# Patient Record
Sex: Male | Born: 1935 | Race: White | Hispanic: No | Smoking: Former smoker
Health system: Southern US, Community
[De-identification: ages and names within clinical notes are randomized; demographics above are authoritative.]

## PROBLEM LIST (undated history)

## (undated) DIAGNOSIS — Z951 Presence of aortocoronary bypass graft: Secondary | ICD-10-CM

## (undated) DIAGNOSIS — R0602 Shortness of breath: Principal | ICD-10-CM

## (undated) DIAGNOSIS — R001 Bradycardia, unspecified: Secondary | ICD-10-CM

## (undated) DIAGNOSIS — I2510 Atherosclerotic heart disease of native coronary artery without angina pectoris: Secondary | ICD-10-CM

## (undated) DIAGNOSIS — N281 Cyst of kidney, acquired: Secondary | ICD-10-CM

## (undated) DIAGNOSIS — N183 Chronic kidney disease, stage 3 unspecified: Secondary | ICD-10-CM

## (undated) DIAGNOSIS — I5042 Chronic combined systolic (congestive) and diastolic (congestive) heart failure: Secondary | ICD-10-CM

## (undated) DIAGNOSIS — R9430 Abnormal result of cardiovascular function study, unspecified: Secondary | ICD-10-CM

## (undated) DIAGNOSIS — J189 Pneumonia, unspecified organism: Secondary | ICD-10-CM

## (undated) DIAGNOSIS — G459 Transient cerebral ischemic attack, unspecified: Secondary | ICD-10-CM

## (undated) DIAGNOSIS — I340 Nonrheumatic mitral (valve) insufficiency: Secondary | ICD-10-CM

## (undated) DIAGNOSIS — N200 Calculus of kidney: Secondary | ICD-10-CM

## (undated) DIAGNOSIS — I739 Peripheral vascular disease, unspecified: Secondary | ICD-10-CM

## (undated) DIAGNOSIS — I351 Nonrheumatic aortic (valve) insufficiency: Secondary | ICD-10-CM

## (undated) DIAGNOSIS — E785 Hyperlipidemia, unspecified: Secondary | ICD-10-CM

## (undated) DIAGNOSIS — I77810 Thoracic aortic ectasia: Secondary | ICD-10-CM

## (undated) DIAGNOSIS — K529 Noninfective gastroenteritis and colitis, unspecified: Secondary | ICD-10-CM

## (undated) DIAGNOSIS — R42 Dizziness and giddiness: Secondary | ICD-10-CM

## (undated) DIAGNOSIS — C801 Malignant (primary) neoplasm, unspecified: Secondary | ICD-10-CM

## (undated) DIAGNOSIS — Z77090 Contact with and (suspected) exposure to asbestos: Secondary | ICD-10-CM

## (undated) DIAGNOSIS — I509 Heart failure, unspecified: Secondary | ICD-10-CM

## (undated) DIAGNOSIS — I10 Essential (primary) hypertension: Secondary | ICD-10-CM

## (undated) HISTORY — DX: Presence of aortocoronary bypass graft: Z95.1

## (undated) HISTORY — DX: Shortness of breath: R06.02

## (undated) HISTORY — DX: Atherosclerotic heart disease of native coronary artery without angina pectoris: I25.10

## (undated) HISTORY — PX: OTHER SURGICAL HISTORY: SHX169

## (undated) HISTORY — DX: Nonrheumatic aortic (valve) insufficiency: I35.1

## (undated) HISTORY — DX: Cyst of kidney, acquired: N28.1

## (undated) HISTORY — DX: Abnormal result of cardiovascular function study, unspecified: R94.30

## (undated) HISTORY — PX: CORONARY ARTERY BYPASS GRAFT: SHX141

## (undated) HISTORY — DX: Nonrheumatic mitral (valve) insufficiency: I34.0

## (undated) HISTORY — DX: Essential (primary) hypertension: I10

## (undated) HISTORY — DX: Hyperlipidemia, unspecified: E78.5

## (undated) HISTORY — DX: Pneumonia, unspecified organism: J18.9

## (undated) HISTORY — DX: Bradycardia, unspecified: R00.1

## (undated) HISTORY — DX: Contact with and (suspected) exposure to asbestos: Z77.090

## (undated) HISTORY — DX: Dizziness and giddiness: R42

## (undated) HISTORY — DX: Noninfective gastroenteritis and colitis, unspecified: K52.9

## (undated) HISTORY — DX: Transient cerebral ischemic attack, unspecified: G45.9

## (undated) HISTORY — DX: Calculus of kidney: N20.0

## (undated) HISTORY — DX: Peripheral vascular disease, unspecified: I73.9

---
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
## 1999-04-27 DIAGNOSIS — I779 Disorder of arteries and arterioles, unspecified: Secondary | ICD-10-CM

## 1999-04-27 HISTORY — DX: Disorder of arteries and arterioles, unspecified: I77.9

## 1999-10-30 ENCOUNTER — Ambulatory Visit: Admission: RE | Admit: 1999-10-30 | Discharge: 1999-10-30 | Payer: Self-pay | Admitting: Pulmonary Disease

## 2001-04-26 DIAGNOSIS — I2510 Atherosclerotic heart disease of native coronary artery without angina pectoris: Secondary | ICD-10-CM

## 2001-04-26 HISTORY — DX: Atherosclerotic heart disease of native coronary artery without angina pectoris: I25.10

## 2001-12-19 ENCOUNTER — Ambulatory Visit (HOSPITAL_COMMUNITY): Admission: RE | Admit: 2001-12-19 | Discharge: 2001-12-19 | Payer: Self-pay | Admitting: Cardiology

## 2001-12-19 ENCOUNTER — Encounter: Payer: Self-pay | Admitting: Cardiology

## 2005-04-26 DIAGNOSIS — G459 Transient cerebral ischemic attack, unspecified: Secondary | ICD-10-CM

## 2005-04-26 HISTORY — DX: Transient cerebral ischemic attack, unspecified: G45.9

## 2005-05-03 ENCOUNTER — Encounter: Payer: Self-pay | Admitting: Cardiology

## 2005-06-03 ENCOUNTER — Ambulatory Visit: Payer: Self-pay | Admitting: Cardiology

## 2005-07-09 ENCOUNTER — Encounter: Payer: Self-pay | Admitting: Cardiology

## 2005-11-17 ENCOUNTER — Ambulatory Visit: Payer: Self-pay | Admitting: Cardiology

## 2006-01-03 ENCOUNTER — Ambulatory Visit: Payer: Self-pay | Admitting: Cardiology

## 2006-04-22 ENCOUNTER — Ambulatory Visit: Payer: Self-pay | Admitting: Cardiology

## 2006-05-24 ENCOUNTER — Ambulatory Visit: Payer: Self-pay | Admitting: Cardiology

## 2006-11-21 ENCOUNTER — Ambulatory Visit: Payer: Self-pay | Admitting: Cardiology

## 2007-05-01 ENCOUNTER — Encounter: Payer: Self-pay | Admitting: Cardiology

## 2007-09-11 ENCOUNTER — Ambulatory Visit: Payer: Self-pay | Admitting: Cardiology

## 2007-09-22 ENCOUNTER — Encounter: Payer: Self-pay | Admitting: Cardiology

## 2007-10-06 ENCOUNTER — Ambulatory Visit: Payer: Self-pay | Admitting: Cardiology

## 2008-05-21 ENCOUNTER — Ambulatory Visit: Payer: Self-pay | Admitting: Cardiology

## 2008-07-01 ENCOUNTER — Encounter: Payer: Self-pay | Admitting: Cardiology

## 2008-12-20 DIAGNOSIS — Z87442 Personal history of urinary calculi: Secondary | ICD-10-CM | POA: Insufficient documentation

## 2008-12-20 DIAGNOSIS — Z77090 Contact with and (suspected) exposure to asbestos: Secondary | ICD-10-CM | POA: Insufficient documentation

## 2008-12-20 DIAGNOSIS — Z8719 Personal history of other diseases of the digestive system: Secondary | ICD-10-CM | POA: Insufficient documentation

## 2008-12-20 DIAGNOSIS — E119 Type 2 diabetes mellitus without complications: Secondary | ICD-10-CM | POA: Insufficient documentation

## 2009-08-05 ENCOUNTER — Encounter: Payer: Self-pay | Admitting: Cardiology

## 2009-08-25 ENCOUNTER — Encounter (INDEPENDENT_AMBULATORY_CARE_PROVIDER_SITE_OTHER): Payer: Self-pay | Admitting: *Deleted

## 2009-08-25 ENCOUNTER — Encounter: Payer: Self-pay | Admitting: Cardiology

## 2009-08-26 ENCOUNTER — Ambulatory Visit: Payer: Self-pay | Admitting: Cardiology

## 2010-05-26 NOTE — Miscellaneous (Signed)
  Clinical Lists Changes  Problems: Added new problem of BRADYCARDIA (ICD-427.89) Added new problem of CAROTID ARTERY DISEASE (ICD-433.10) Added new problem of DIZZINESS (ICD-780.4) Observations: Added new observation of PAST MED HX: PNEUMONIA, LEFT LOWER LOBE, HX OF (ICD-481) MYOCARDIAL INFARCTION, HX OF (ICD-412) ASBESTOS EXPOSURE, HX OF (ICD-V15.84) AORTIC SCLEROSIS (ICD-424.1) DYSLIPIDEMIA (ICD-272.4) NEPHROLITHIASIS, HX OF (ICD-V13.01) PERIPHERAL VASCULAR DISEASE (ICD-443.9) DIABETES MELLITUS (ICD-250.00) RENAL CYST, HX OF (ICD-593.2) COLITIS, HX OF (ICD-V12.79) HYPERTENSION (ICD-401.9) CAD   last cath 2003.Marland KitchenMarland Kitchenpotential ischemia in ramus...grafts patent (last nuclear 2002) CABG TIA  ??  2007..medical Rx Dizziness EF  40-45...nuclear 2003  /  50%...echo...12/1998 Bradycardia   muild Carotid artery disease   bilateral 0-39%...2001 SOB   (08/25/2009 13:25)       Past History:  Past Medical History: PNEUMONIA, LEFT LOWER LOBE, HX OF (ICD-481) MYOCARDIAL INFARCTION, HX OF (ICD-412) ASBESTOS EXPOSURE, HX OF (ICD-V15.84) AORTIC SCLEROSIS (ICD-424.1) DYSLIPIDEMIA (ICD-272.4) NEPHROLITHIASIS, HX OF (ICD-V13.01) PERIPHERAL VASCULAR DISEASE (ICD-443.9) DIABETES MELLITUS (ICD-250.00) RENAL CYST, HX OF (ICD-593.2) COLITIS, HX OF (ICD-V12.79) HYPERTENSION (ICD-401.9) CAD   last cath 2003.Marland KitchenMarland Kitchenpotential ischemia in ramus...grafts patent (last nuclear 2002) CABG TIA  ??  2007..medical Rx Dizziness EF  40-45...nuclear 2003  /  50%...echo...12/1998 Bradycardia   muild Carotid artery disease   bilateral 0-39%...2001 SOB

## 2010-05-26 NOTE — Miscellaneous (Signed)
Summary: Refill:Simvastatin  Clinical Lists Changes  Medications: Rx of SIMVASTATIN 40 MG TABS (SIMVASTATIN) Take 1 tablet by mouth at bedtime;  #30 x 1;  Signed;  Entered by: Cyril Loosen, RN, BSN;  Authorized by: Talitha Givens, MD, [facility];  Method used: Electronically to [facility]*, [address] [place], [place], [place]  [address], Ph: [phone], Fax: [phone]    Prescriptions: SIMVASTATIN 40 MG TABS (SIMVASTATIN) Take 1 tablet by mouth at bedtime  #30 x 1   Entered by:   Cyril Loosen, RN, BSN   Authorized by:   Talitha Givens, MD, [facility]   Signed by:   Cyril Loosen, RN, BSN on 08/25/2009   Method used:   Electronically to        [facility]* (retail)       [address]       [place], [place]  [address]       Ph: [phone]       Fax: [phone]   RxID:   [id number]

## 2010-05-26 NOTE — Assessment & Plan Note (Signed)
Summary: LAST SEEN 01/10   Visit Type:  Follow-up Primary Provider:  Beatrix Fetters Shah,MD   History of Present Illness: The patient is seen for cardiology followup.  I followed him for many years when he had significant recurring problems with ischemic heart disease.  This now has been stable for many years.  He has not had chest pain or shortness of breath.  His last catheter was in 2003.  I have not done nuclear testing or echo because he is stable and prefers not to have studies.  He had only minimal carotid disease in the past it is my understanding that he has had Dopplers checked in Dr.Shah's office.  He works very hard to help take care of his wife who is had multiple bony fractures of her legs and has a significant bladder problems.  The patient has been bothered by dizziness.  He is had this problem in the past but he is intermittently worse.  With careful history it sounds more likely that this is vertigo.  Preventive Screening-Counseling & Management  Alcohol-Tobacco     Smoking Status: quit     Year Quit: 2002  Current Medications (verified): 1)  Furosemide 20 Mg Tabs (Furosemide) .... Take 1 Tablet By Mouth Once A Day 2)  Simvastatin 40 Mg Tabs (Simvastatin) .... Take 1 Tablet By Mouth At Bedtime 3)  Glimepiride 4 Mg Tabs (Glimepiride) .... Take 1 Tablet By Mouth Once A Day 4)  Benazepril Hcl 20 Mg Tabs (Benazepril Hcl) .... Take 1 Tablet By Mouth Once A Day 5)  Digoxin 0.25 Mg Tabs (Digoxin) .... Take 1 Tablet By Mouth Once A Day 6)  Meclizine Hcl 25 Mg Tabs (Meclizine Hcl) .... Take 1 Tablet By Mouth Three Times A Day 7)  Aspir-Trin 325 Mg Tbec (Aspirin) .... Take 1 Tablet By Mouth Once A Day 8)  Zocor 40 Mg Tabs (Simvastatin) .... Take 1 Tablet By Mouth Once A Day 9)  Multivitamins  Tabs (Multiple Vitamin) .... Take 1 Tablet By Mouth Once A Day 10)  Fish Oil 1200 Mg Caps (Omega-3 Fatty Acids) .... Take 1 Tablet By Mouth Once A Day 11)  Metformin Hcl 1000 Mg Tabs (Metformin  Hcl) .... Take 1 Tablet By Mouth Two Times A Day 12)  Metoprolol Succinate 50 Mg Xr24h-Tab (Metoprolol Succinate) .... Take 1 Tablet By Mouth Once A Day 13)  Nitrostat 0.4 Mg Subl (Nitroglycerin) .... Use As Directed  Allergies (verified): 1)  ! Tetracycline 2)  ! Sulfa  Comments:  Nurse/Medical Assistant: The patient is currently on medications but does not know the name or dosage at this time. Instructed to contact our office with details. Will update medication list at that time.  Past History:  Past Medical History: Last updated: 08/25/2009 PNEUMONIA, LEFT LOWER LOBE, HX OF (ICD-481) MYOCARDIAL INFARCTION, HX OF (ICD-412) ASBESTOS EXPOSURE, HX OF (ICD-V15.84) AORTIC SCLEROSIS (ICD-424.1) DYSLIPIDEMIA (ICD-272.4) NEPHROLITHIASIS, HX OF (ICD-V13.01) PERIPHERAL VASCULAR DISEASE (ICD-443.9) DIABETES MELLITUS (ICD-250.00) RENAL CYST, HX OF (ICD-593.2) COLITIS, HX OF (ICD-V12.79) HYPERTENSION (ICD-401.9) CAD   last cath 2003.Marland KitchenMarland Kitchenpotential ischemia in ramus...grafts patent (last nuclear 2002) CABG TIA  ??  2007..medical Rx Dizziness EF  40-45...nuclear 2003  /  50%...echo...12/1998 Bradycardia   muild Carotid artery disease   bilateral 0-39%...2001 SOB    Review of Systems       Patient denies fever, chills, headache, sweats, rash, change in vision, change in hearing, chest pain, cough, nausea vomiting, urinary symptoms.  All other systems are reviewed and are negative.  Vital Signs:  Patient profile:   77 year old male Height:      71 inches Weight:      197 pounds BMI:     27.58 Pulse (ortho):   66 / minute BP standing:   162 / 76 Cuff size:   large  Vitals Entered By: Carlye Grippe (Aug 26, 2009 1:13 PM)  Nutrition Counseling: Patient's BMI is greater than 25 and therefore counseled on weight management options.  Serial Vital Signs/Assessments:  Time      Position  BP       Pulse  Resp  Temp     By 1:18 PM   Lying LA  141/82   61                    Lydia  Anderson 1:18 PM   Sitting   155/74   59                    Lydia Anderson 1:18 PM   Standing  162/76   66                    Lydia Anderson 1:22 PM   Standing  149/83   60                    Lydia Anderson 1:24 PM   Standing  170/81   62                    Carlye Grippe    Physical Exam  General:  the patient is stable in general. Head:  head is atraumatic. Eyes:  no xanthelasma. Neck:  no carotid bruits.  No jugular venous distention. Chest Wall:  no chest wall tenderness. Lungs:  lungs are clear.  Breath effort is not labored. Heart:  cardiac exam reveals an S1-S2.  No clicks or significant murmurs. Abdomen:  abdomen is soft. Msk:  no musculoskeletal deformities. Extremities:  no peripheral edema. Skin:  no skin rashes. Psych:  patient is oriented to person time and place.  Affect is normal.   Impression & Recommendations:  Problem # 1:  DIZZINESS (ICD-780.4) To further evaluate the patient's dizziness orthostatic blood pressures were checked today.  There is no significant blood pressure drop and no symptoms with standing.  His heart rate remains in the range of 60-65.  His symptoms are more compatible with vertigo.  He uses meclizine chronically.  I have recommended that he see ENT doctor  Problem # 2:  CAROTID ARTERY DISEASE (ICD-433.10) Patient has had Dopplers through his primary physician over the years.  He has not had severe disease documented.  Problem # 3:  BRADYCARDIA (ICD-427.89)  His updated medication list for this problem includes:    Benazepril Hcl 20 Mg Tabs (Benazepril hcl) .Marland Kitchen... Take 1 tablet by mouth once a day    Aspir-trin 325 Mg Tbec (Aspirin) .Marland Kitchen... Take 1 tablet by mouth once a day    Metoprolol Succinate 50 Mg Xr24h-tab (Metoprolol succinate) .Marland Kitchen... Take 1 tablet by mouth once a day    Nitrostat 0.4 Mg Subl (Nitroglycerin) ..... Use as directed EKG is done today and reviewed by me.  Heart rate is 55.  This is stable bradycardia for him.  We have to  keep his bradycardia in mind but I do not believe it is causing symptoms at this point.  Problem # 4:  CORONARY ARTERY DISEASE (ICD-414.00)  His updated  medication list for this problem includes:    Benazepril Hcl 20 Mg Tabs (Benazepril hcl) .Marland Kitchen... Take 1 tablet by mouth once a day    Aspir-trin 325 Mg Tbec (Aspirin) .Marland Kitchen... Take 1 tablet by mouth once a day    Metoprolol Succinate 50 Mg Xr24h-tab (Metoprolol succinate) .Marland Kitchen... Take 1 tablet by mouth once a day    Nitrostat 0.4 Mg Subl (Nitroglycerin) ..... Use as directed Coronary disease is stable.  EKG reveals no significant change.  Problem # 5:  AORTIC SCLEROSIS (ICD-424.1)  His updated medication list for this problem includes:    Furosemide 20 Mg Tabs (Furosemide) .Marland Kitchen... Take 1 tablet by mouth once a day    Benazepril Hcl 20 Mg Tabs (Benazepril hcl) .Marland Kitchen... Take 1 tablet by mouth once a day    Digoxin 0.25 Mg Tabs (Digoxin) .Marland Kitchen... Take 1 tablet by mouth once a day    Metoprolol Succinate 50 Mg Xr24h-tab (Metoprolol succinate) .Marland Kitchen... Take 1 tablet by mouth once a day    Nitrostat 0.4 Mg Subl (Nitroglycerin) ..... Use as directed Patient's physical exam has not changed significantly.  I do not believe that he developed significant aortic stenosis.  I chosen not to do a follow up echo at this time.  Other Orders: EKG w/ Interpretation (93000)  Patient Instructions: 1)  Your physician wants you to follow-up in: 1 year. You will receive a reminder letter in the mail one-two months in advance. If you don't receive a letter, please call our office to schedule the follow-up appointment. 2)  Your physician recommends that you continue on your current medications as directed. Please refer to the Current Medication list given to you today.

## 2010-08-25 ENCOUNTER — Other Ambulatory Visit: Payer: Self-pay | Admitting: Cardiology

## 2010-09-08 NOTE — Assessment & Plan Note (Signed)
[facility]                          [facility] NOTE   NAME:[name], [name]                     MRN:          [id number]  DATE:05/21/2008                            DOB:          1926/07/16    [name] is here for cardiac followup.  He is here also with his wife  today.  His wife needs a great deal of help and he is providing all of  the support.  They have enormous stress.  Despite this,  his cardiac  status is stable.  He is not having any significant chest pain.  He does  have significant coronary disease.  I have followed him for many years.  His last cath was in 2003.  I have not done any further exercise testing  as he has been stable.  We will consider that at the time of his next  visit.  He has not had any chest pain, shortness of breath, syncope, or  presyncope.   ALLERGIES:  SULFA, TETRACYCLINE.   MEDICATIONS:  See the flow sheet.   REVIEW OF SYSTEMS:  He is not having any GI or GU complaints.  There is  no fevers or chills or headaches.  He has no skin rash.  His review of  systems otherwise is negative.   PHYSICAL EXAMINATION:  Blood pressure is 132/79 with a pulse of 70.  The  patient is oriented to person, time, and place.  Affect is normal.  HEENT reveals no xanthelasma.  He has normal extraocular motion.  There  are no carotid bruits.  There is no jugular venous distention.  Lungs  are clear.  Respiratory effort is not labored.  Cardiac exam reveals an  S1 with an S2.  There are no clicks or significant murmurs.  His abdomen  is soft.  He has no peripheral edema.  No labs were done today.  I have  reviewed his cholesterol and it is time for it to be repeated.   Problems are listed on the note of Sep 11, 2007.  His coronary status is  stable.  He does need a fasting lipid profile.  No other workup at this  time.  The major issue for him is the stress of taking care of his wife  who needs a great deal of help.  No  changes in his meds at this point.     Luis Abed, MD, [facility]  Electronically Signed    JDK/MedQ  DD: 05/21/2008  DT: 05/22/2008  Job #: [id number]   cc:   Kirstie Peri, MD

## 2010-09-08 NOTE — Assessment & Plan Note (Signed)
[facility]                          [facility] NOTE   NAME:[name], [name]                     MRN:          [id number]  DATE:11/21/2006                            DOB:          17-Sep-1926    HISTORY OF PRESENT ILLNESS:  Mr. Creason is doing well.  He is not having  any chest pain.  He has had no further TIA symptoms.  He is going about  good activities including helping to rebuild an old house.  He does have  some dizziness.  He does not do any work off the ground.   He does have documented coronary disease.  He also has peripheral  vascular disease.   PAST MEDICAL HISTORY:   ALLERGIES:  SULFA, TETRACYCLINE.   MEDICATIONS:  1. Furosemide 40.  2. Metoprolol 25.  3. Glimepiride.  4. Benazepril 20,  5. Digoxin 0.25.  6. Meclizine as needed.  7. Aspirin 325.  8. Zocor 40.  9. Multivitamin.  10.Fish oil.  11.Nitroglycerin p.r.n.  12.Metformin 500 t.i.d.   OTHER MEDICAL PROBLEMS:  See the list below.   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  He has some  slight shortness of breath sensation, sometimes at night, but this does  not sound like true PND or orthopnea.  Otherwise, his review of systems  is negative.   PHYSICAL EXAMINATION:  VITAL SIGNS:  Blood pressure 145/79, pulse 55,  weight 201 pounds.  GENERAL:  The patient is oriented to person, place and time.  Affect is  normal.  HEENT:  No xanthelasma.  He has normal extraocular motion.  NECK:  I do not hear any carotid bruits at this time.  There is no  jugular venous distention.  LUNGS:  Clear.  Respiratory effort is not labored.  CARDIAC:  S1, S2.  There are no clicks or significant murmurs.  ABDOMEN:  Soft.  Normal bowel sounds.  No significant peripheral edema.   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   PROBLEMS:  1. Coronary disease.  His last catheterization was in 2003.  He does      have some potential areas for ischemia in the  distribution of his      native ramus.  Cardiolite in 2004 showed this area for some      potential ischemia, but it was not marked.  2. Peripheral vascular disease. He has had Dopplers done through Dr.      Margaretmary Eddy office, and these will need to be followed over time.  3. Question of a TIA in early 2007.  He was on aspirin and Plavix and      then stopped his Plavix.  He continues on aspirin.  He is not on      Aggrenox at this time.  4. History of dizziness, stable.  5. History of mild hypertension.  6. History of colitis.  7. Diabetes.  8. Allergy to sulfa and tetracycline.  9. History of renal cyst and gallstones.  10.Elevated cholesterol.  He has done well with Zocor.  His HDL is low  and he is on fish oil.  I have chosen not to push Niaspan in this      patient at this time.  11.Ejection fraction in the 40-45% range.  He has mild left      ventricular hypertrophy and some inferior hypokinesis.  12.Peripheral vascular disease.  This is followed by Dr.  Sherryll Burger with      Dopplers through his office.  13.Mild relative bradycardia that is stable.   Mr. Mcconaghy is stable.  We will check a digoxin level.  I will see him  for cardiology follow up in six months.     Luis Abed, MD, [facility]  Electronically Signed    JDK/MedQ  DD: 11/21/2006  DT: 11/22/2006  Job #: [id number]   cc:   Kirstie Peri, MD

## 2010-09-08 NOTE — Assessment & Plan Note (Signed)
[facility]                          [facility] NOTE   NAME:HOWELLPradeep, Beaubrun                     MRN:          [id number]  DATE:09/11/2007                            DOB:          1926/10/27    Mr. Dengel is doing well in general.  He still has some dizziness.  In  addition, however, he is now having some shortness of breath when he  lies down at night.  He is not having any significant chest pain.  There  is no exertional shortness of breath.  He is going about reasonable  activities.  Also, he notices that when he coughs he has some pain in  his anterior chest in the lower sternum.  If he supports this area when  he coughs, he is not as bothered.  It sounds musculoskeletal.  The  patient has known coronary disease.  He has been stable in general.   PAST MEDICAL HISTORY:   ALLERGIES:  SULFA, TETRACYCLINE.   MEDICATIONS:  Metoprolol 50, glimepiride, benazepril, digoxin,  meclizine, aspirin, Zocor, multivitamin, fish oil, and metformin.   OTHER MEDICAL PROBLEMS:  See the list below.   REVIEW OF SYSTEMS:  Otherwise, he has no other complaints and his review  of systems otherwise is negative.   PHYSICAL EXAM:  Blood pressure is 125/72 with a pulse of 55.  Weight is  200 pounds.  The patient is oriented to person, time and place.  Affect is normal.  HEENT:  Reveals no xanthelasma.  He has normal extraocular motion.  There is no carotid bruit.  There is no jugular venous distention.  Lungs are clear.  Respiratory effort is not labored.  Cardiac exam reveals S1-S2.  There are no clicks or significant murmurs.  The abdomen is soft.  The patient is trace peripheral edema.   No labs were done today.   PROBLEMS:  1. Coronary disease.  His last cath was in 2003.  He has some      potential ischemia in the distribution of the ramus.  I have not      pushed for an exercise test recently.  I will consider this after      seeing him back.  2. Peripheral vascular disease.  3. Question of a transient ischemic attack in 2007.  He was on aspirin      and Plavix and now he is on aspirin.  At some point it may be      appropriate to switch him to Aggrenox.  4. History of dizziness.  This is a chronic problem.  5. History of mild hypertension treated.  6. History of colitis.  7. Diabetes.  8. Allergies to SULFA and TETRACYCLINE.  9. History of some renal cysts and gallstones.  10.Elevated cholesterol.  He is tolerating Zocor and will remain on      it.  11.Ejection fraction in the 40-45% range with some inferior      hypokinesis.  When I see him back we will consider a 2-D echo.  12.Peripheral vascular disease.  Dopplers were done in Dr. Margaretmary Eddy  office.  13.Mild relative bradycardia that is stable.  14.Sensation of some shortness of breath when he lays down at night.      I am concerned about the shortness of breath.  It may be a volume      issue although he does not appear to be markedly volume overloaded      at this time.  He has been on Lasix some in the past.  I will put      him on 20 of Lasix and see him for followup.  BMET will be checked      before he comes back in.     Luis Abed, MD, [facility]  Electronically Signed    JDK/MedQ  DD: 09/11/2007  DT: 09/11/2007  Job #: [id number]   cc:   Kirstie Peri, MD

## 2010-09-08 NOTE — Assessment & Plan Note (Signed)
[facility]                          [facility] NOTE   NAME:[name], [name]                     MRN:          [id number]  DATE:10/06/2007                            DOB:          1926-09-27    [name] returns for followup.  I saw him on Sep 11, 2007.  At that  time, I started him on a low dose of Lasix.  He says he thinks he feels  about the same.  However, his weight is down a few pounds and his edema  is gone, and I believe that it has probably helped him.   He is not having any chest pain.  He has had no syncope or presyncope.  He mentions that he has a sensation of fatigue but then when he becomes  active, he is able to go about his full activities.   PAST MEDICAL HISTORY:   ALLERGIES:  SULFA and TETRACYCLINE.   MEDICATIONS:  1. Glimepiride.  2. Benazepril.  3. Digoxin.  4. Meclizine.  5. Aspirin.  6. Zocor.  7. Multivitamin.  8. Fish oil.  9. Metformin.  10.Metoprolol.  11.Lasix 20 mg.   OTHER MEDICAL PROBLEMS:  See the complete list on my note of Sep 11, 2007.   REVIEW OF SYSTEMS:  Other than the HPI, his review of systems is  negative.   PHYSICAL EXAMINATION:  VITAL SIGNS:  Blood pressure is 140/84 with pulse  of 67.  The patient is oriented to person, time, and place.  Affect is normal.  HEENT:  No xanthelasma.  There is normal extraocular motion.  There are  no carotid bruits.  There is no jugular venous distention.  LUNGS:  Clear.  Respiratory effort is not labored.  CARDIAC:  S1 and S2.  There are no clicks or significant murmurs.  ABDOMEN:  Soft.  There is no peripheral edema today.   LABORATORY:  His EKG reveals no significant change.   Problems are listed on the note of Sep 11, 2007.  I believe that slight  volume overload has been a problem.  We did do a followup BMET on Sep 22, 2007,  and his creatinine was 0.9.  There  was some slight glucose elevation, and he needs followup with his  primary  physician for this.  He is stable.  I will see him back in 3  months.     Luis Abed, MD, [facility]  Electronically Signed    JDK/MedQ  DD: 10/06/2007  DT: 10/07/2007  Job #: [id number]

## 2010-09-09 ENCOUNTER — Encounter: Payer: Self-pay | Admitting: Cardiology

## 2010-09-10 ENCOUNTER — Encounter: Payer: Self-pay | Admitting: Cardiology

## 2010-09-11 ENCOUNTER — Encounter: Payer: Self-pay | Admitting: Cardiology

## 2010-09-11 DIAGNOSIS — I25810 Atherosclerosis of coronary artery bypass graft(s) without angina pectoris: Secondary | ICD-10-CM | POA: Insufficient documentation

## 2010-09-11 DIAGNOSIS — I10 Essential (primary) hypertension: Secondary | ICD-10-CM | POA: Insufficient documentation

## 2010-09-11 DIAGNOSIS — E785 Hyperlipidemia, unspecified: Secondary | ICD-10-CM | POA: Insufficient documentation

## 2010-09-11 DIAGNOSIS — Z951 Presence of aortocoronary bypass graft: Secondary | ICD-10-CM | POA: Insufficient documentation

## 2010-09-11 DIAGNOSIS — G459 Transient cerebral ischemic attack, unspecified: Secondary | ICD-10-CM | POA: Insufficient documentation

## 2010-09-11 DIAGNOSIS — R001 Bradycardia, unspecified: Secondary | ICD-10-CM | POA: Insufficient documentation

## 2010-09-11 NOTE — Assessment & Plan Note (Signed)
[facility]                          [facility] NOTE   NAME:[name], [name]                     MRN:          [id number]  DATE:05/24/2006                            DOB:          Oct 06, 1926    [name] is doing well.  I saw him last on November 19, 2005.  There was  question that he had had a TIA.  At one point he was actually placed on  Plavix and I believe that his aspirin may have been held.  Since that  time his Plavix has become too expensive and he decided to put himself  back on full dose aspirin and stop the Plavix.  He appears to be quite  stable with this.   He is not having any chest pain or shortness of breath.  He is going  about reasonable activities.  He has had several skin cancers and is  having these treated successfully.   PAST MEDICAL HISTORY:  ALLERGIES:  1. SULFA.  2. TETRACYCLINE.   Medications:  1. Furosemide 40.  2. Metformin 500 b.i.d.  3. Metoprolol 25.  4. Glimepiride.  5. Benazepril 20.  6. Digoxin.  7. Meclizine.  8. Lipitor 20 (to be changed to Zocor 40, generic simvastatin).  9. Aspirin 325.  10.Question Actos.   Other Medical Problems:  See the list on my note of November 17, 2005.   REVIEW OF SYSTEMS:  He is really feeling well. His major problem is  paying for his medicines.   PHYSICAL EXAM:  Blood pressure today is 150/82 with a pulse of 86.  Weight is 206 pounds.  The patient is oriented to person, time and  place.  Affect is normal.  There is no xanthelasma.  There is normal  extraocular motion.  There are no carotid bruits.  There is no jugular  venous distention.  LUNGS:  Clear.  RESPIRATORY EFFORT:  Not labored.  CARDIAC:  Reveals an S1 with an S2.  There are no clicks or significant  murmurs.  ABDOMEN:  Soft.  There are no masses or bruits.  He has no peripheral  edema.   The patient did have a fasting lipid profile dated April 22, 2006.  His cholesterol was 138 with  triglycerides 160, LDL was 81 and his HDL,  unfortunately, was low at 25.   PROBLEMS:  See the complete list on my note of June 03, 2005.  1. Coronary disease, stable.  2. Elevated cholesterol.   He will be switched to simvastatin 40 and otherwise his cardiac status  is stable.  I will see him back in 6 months.     Luis Abed, MD, [facility]  Electronically Signed    JDK/MedQ  DD: 05/24/2006  DT: 05/24/2006  Job #: [id number]   cc:   Kirstie Peri, MD

## 2010-09-11 NOTE — Cardiovascular Report (Signed)
NAME:  [name], [name] NO.:  [id number]   MEDICAL RECORD NO.:  [id number]                   PATIENT TYPE:  OIB   LOCATION:  2899                                 FACILITY:  [facility]   PHYSICIAN:  Rollene Rotunda, M.D. [facility]            DATE OF BIRTH:  May 12, 1926   DATE OF PROCEDURE:  12/19/2001  DATE OF DISCHARGE:                              CARDIAC CATHETERIZATION   PRIMARY CARE PHYSICIAN:  Kirstie Peri, M.D.   PROCEDURE:  Left heart catheterization/coronary arteriography.   INDICATIONS FOR PROCEDURE:  Evaluate patient with chest discomfort and a  Cardiolite suggesting a previous inferior infarct with small probable  ischemia in this distribution as well as moderate ischemia in the  anterolateral base and mid segments.   PROCEDURE NOTE:  Left heart catheterization was performed via the right  femoral artery.  The artery was cannulated using an anterior wall puncture.  A #6 French arterial sheath was inserted via the modified Seldinger  technique.  Preformed Judkins and a pigtail catheter were utilized.  The  patient tolerated the procedure well and left the lab in stable condition.   RESULTS:  1. Hemodynamics     A. LV:  134/21.     B. AO:  133/63.  2. Coronaries     A. The left main was normal.     B. The LAD had a long proximal 80% stenosis into a first diagonal.  The        first diagonal was moderate-sized with a long proximal 80% stenosis.        A first septal perforator was small with ostial 99% stenosis.     C. The circumflex in the AV groove had long 70% stenosis extending into a        mid obtuse marginal.  The mid obtuse marginal was then occluded.        There was a ramus intermedius which was small.  It had ostial long 90-        95% stenosis.     D. Right coronary artery:  The right coronary artery was a large dominant        vessel.  It had long diffuse proximal and mid 50% stenosis.  The PDA        was occluded at the ostium.  A  moderate-sized posterolateral was        occluded at the ostium.  3. Grafts     A. A LIMA to the diagonal and sequential to the mid LAD was widely        patent.     B. A saphenous vein graft to the mid obtuse marginal was widely patent.     C. A saphenous vein graft sequential to the PDA and posterolateral had        long ostial 40% stenosis.  4. Left ventriculogram:  A left ventriculogram was obtained in the RAO  projection.  The EF is approximately 40% with mild global hypokinesis and     inferior moderate akinesis.   CONCLUSION:  Severe native three-vessel coronary artery disease.  Patent  bypass grafts with nonobstructive disease in the graft to the posterior  descending artery posterolateral.  Moderate left ventricular dysfunction.    PLAN:  The patient's ischemia is most likely related to the native ramus  vessel which is not a large caliber vessel.  This would not be amenable to  percutaneous revascularization; therefore, he should have aggressive  secondary risk factor modification continued.  I will add Imdur 60 mg to his  regimen.  We could also consider switching him to Coreg since he will not  seem to tolerate therapeutic doses of Lopressor.  I will defer this to Dr.  Myrtis Ser.                                               Rollene Rotunda, M.D. [facility]    JH/MEDQ  D:  12/19/2001  T:  12/19/2001  Job:  [id number]   cc:   Celso Sickle, M.D. [facility]

## 2010-09-11 NOTE — Assessment & Plan Note (Signed)
[facility]                            [facility] NOTE   NAME:[name], [name]                     MRN:          [id number]  DATE:11/17/2005                            DOB:          [birth year]    [name] is doing well.  He tells me since I saw him in February of 2007  that he had what sounds like a TIA.  He tells me he was evaluated fully.  His aspirin was changed to another medication and he eventually ended up on  Plavix.  He tells me that he had appropriate scans for this to his head.  I  do not have that information at this time.  The patient has not had any  significant chest pain and no change in some mild shortness of breath.  His  last Cardiolite was in 2003 and he had some potential areas of ischemia.  He  is not having any chest discomfort.  I will continue to follow him  symptomatically.   PAST MEDICAL HISTORY:   ALLERGIES:  SULFA and TETRACYCLINE.   MEDICATIONS:  1.  Furosemide 40 mg.  2.  Metformin 500 mg b.i.d.  3.  Toprol 25 mg.  4.  Glimepiride 4 mg.  5.  Benazepril 20 mg.  6.  Digoxin 0.25 mg.  7.  Meclizine t.i.d.  8.  Avandia 4 mg.  9.  Lipitor 20 mg.  10. Fexofenadine 180 mg.  11. Plavix 75 mg.   OTHER MEDICAL PROBLEMS:  See the list in my note of June 03, 2005.   REVIEW OF SYSTEMS:  The patient has some mild dizziness when he looks  upwards.  He has had this for 5-6 years.  Otherwise his review of systems  was negative.   PHYSICAL EXAMINATION:  VITAL SIGNS:  Blood pressure is 148/70 today.  Pulse  is 78.  Lungs are clear.  Respiratory effort is not labored.  NEUROLOGIC:  The patient is oriented to person, time and place, and his  affect is normal.  HEENT:  No xanthelasma.  He has normal extraocular motion.  NECK:  There are no carotid bruits.  There is no jugular venous distention.  CARDIAC:  An S1 and S2.  There are no clicks or significant murmurs.  ABDOMEN:  Soft.  There are no masses or  bruits.  There is no peripheral  edema.   No labs were done.   Problems are listed on my note of June 03, 2005.  1.  Coronary disease, stable.  2.  Peripheral vascular disease.  Dopplers have been done through [name]      office.  3.  New problem:  Question of a transient ischemic attack described by the      patient recently that was evaluated through the [facility] system.  He is      now on Plavix.  His aspirin was stopped.  I do not know the history of      the choice of medicines in this case.  However, I wonder if Aggrenox      could be considered in  this situation.  Therefore, I will put this in      the note to [name] and I will ask the patient to see if Aggrenox can      be considered here.                                   Jesse Abed, MD, [facility]   JDK/MedQ  DD:  11/17/2005  DT:  11/17/2005  Job #:  [id number]   cc:   Jesse Peri, MD

## 2010-09-14 ENCOUNTER — Ambulatory Visit (INDEPENDENT_AMBULATORY_CARE_PROVIDER_SITE_OTHER): Payer: Medicare Other | Admitting: Cardiology

## 2010-09-14 ENCOUNTER — Encounter: Payer: Self-pay | Admitting: Cardiology

## 2010-09-14 DIAGNOSIS — R0602 Shortness of breath: Secondary | ICD-10-CM

## 2010-09-14 DIAGNOSIS — I700 Atherosclerosis of aorta: Secondary | ICD-10-CM

## 2010-09-14 DIAGNOSIS — R42 Dizziness and giddiness: Secondary | ICD-10-CM | POA: Insufficient documentation

## 2010-09-14 DIAGNOSIS — I779 Disorder of arteries and arterioles, unspecified: Secondary | ICD-10-CM

## 2010-09-14 DIAGNOSIS — IMO0001 Reserved for inherently not codable concepts without codable children: Secondary | ICD-10-CM

## 2010-09-14 DIAGNOSIS — I10 Essential (primary) hypertension: Secondary | ICD-10-CM

## 2010-09-14 DIAGNOSIS — I498 Other specified cardiac arrhythmias: Secondary | ICD-10-CM

## 2010-09-14 DIAGNOSIS — R001 Bradycardia, unspecified: Secondary | ICD-10-CM

## 2010-09-14 NOTE — Assessment & Plan Note (Signed)
He complained of shortness of breath is a new one.  He says that it occurs primarily at night time and he has to sit up.  It certainly sounds like the possibility of orthopnea.  However he has no evidence of significant volume overload.  Two-dimensional echo will be done to reassess his LV function.  I will then see him back to see what medication changes we might consider.

## 2010-09-14 NOTE — Progress Notes (Signed)
HPI Patient is here today for cardiology followup.  He has known coronary disease post CABG in the past.  He is not having any chest pain.  He is having some shortness of breath.  This occurs primarily at night.  It sounds like it may be orthopnea.  However he does not have any volume overload.  He may have shortness of breath also during the day on a random basis.  Is not feeling any palpitations.  She does have resting bradycardia.  He said dizziness for many years.  This has been worked up extensively.  When he saw an ear doctor physical therapy was recommended for one month.  I do not have access to any diagnoses.  Unfortunately he has had falling spells over many years related to his dizziness.  This does not sound cardiac in origin. Allergies  Allergen Reactions  . Sulfonamide Derivatives   . Tetracycline     Current Outpatient Prescriptions  Medication Sig Dispense Refill  . aspirin (ASPIR-TRIN) 325 MG EC tablet Take 325 mg by mouth daily.        . benazepril (LOTENSIN) 20 MG tablet Take 20 mg by mouth daily.        . digoxin (LANOXIN) 0.25 MG tablet Take 250 mcg by mouth daily.        Marland Kitchen glimepiride (AMARYL) 4 MG tablet Take 4 mg by mouth daily before breakfast.        . metFORMIN (GLUCOPHAGE) 500 MG tablet Take 1,000 mg by mouth 2 (two) times daily with a meal.        . metoprolol (TOPROL-XL) 50 MG 24 hr tablet Take 50 mg by mouth daily.        . Multiple Vitamin (MULTIVITAMIN) tablet Take 1 tablet by mouth daily.        . nitroGLYCERIN (NITROSTAT) 0.4 MG SL tablet Place 0.4 mg under the tongue every 5 (five) minutes as needed.        . Omega-3 Fatty Acids (FISH OIL) 1200 MG CAPS Take 1 capsule by mouth daily.        . simvastatin (ZOCOR) 40 MG tablet TAKE ONE TABLET BY MOUTH AT BEDTIME.  30 tablet  6  . DISCONTD: furosemide (LASIX) 20 MG tablet Take 20 mg by mouth daily.        Marland Kitchen DISCONTD: meclizine (ANTIVERT) 25 MG tablet Take 25 mg by mouth 3 (three) times daily as needed.           History   Social History  . Marital Status: Married    Spouse Name: N/A    Number of Children: 4  . Years of Education: N/A   Occupational History  . RETIRED    Social History Main Topics  . Smoking status: Former Smoker -- 2.0 packs/day for 30 years    Types: Cigarettes    Quit date: 04/26/1980  . Smokeless tobacco: Never Used  . Alcohol Use: Not on file  . Drug Use: Not on file  . Sexually Active: Not on file   Other Topics Concern  . Not on file   Social History Narrative  . No narrative on file    Family History  Problem Relation Age of Onset  . Heart attack Mother 54    Past Medical History  Diagnosis Date  . Pneumonia     left lower lobe  . Hx of CABG   . Asbestos exposure   . Aortic sclerosis   . Dyslipidemia   . Nephrolithiasis   .  PVD (peripheral vascular disease)   . Diabetes mellitus   . Renal cyst   . Colitis   . Hypertension   . CAD (coronary artery disease) 2003    last catheterization 2003, potential ischemia in the ramus, grafts patent  ( last nuclear 2002)  . TIA (transient ischemic attack) 2007    ?? medical Rx   . Dizziness     He has had falls related to this  . Bradycardia     MILD  . Carotid artery disease 2001    BILATERAL 0-39%/  Follow with Dopplers by Dr. Sherril Croon  . SOB (shortness of breath)   . Ejection fraction < 50%     EF 40-45%..NUCLEAR 2003/ 50%.Marland KitchenECHO 12/1998  . Shortness of breath     May, 2012    Past Surgical History  Procedure Date  . Coronary artery bypass graft     ROS  Patient denies fever, chills, headache, sweats, rash, change in vision, change in hearing, chest pain, cough, nausea vomiting, urinary symptoms.  All other systems are reviewed and are negative  PHYSICAL EXAM Patient is quite stable.  He is here with his wife.  He is oriented to person time and place.  Affect is normal.  Head is atraumatic.  There is no xanthelasma.  There is a soft carotid bruit.  There is no jugular venous distention.   Lungs are clear.  Respiratory effort is nonlabored.  Cardiac exam reveals S1-S2.  No clicks or significant murmurs.  The abdomen is soft.  There is no peripheral edema.  There are no musculoskeletal deformities.  There are no skin rashes. Filed Vitals:   09/14/10 1107  BP: 154/84  Pulse: 54  Height: 5\' 11"  (1.803 m)  Weight: 192 lb (87.091 kg)    EKG  EKG is done today and reviewed by me.  He has sinus bradycardia and old inferior infarct.  There is no significant change.  ASSESSMENT & PLAN

## 2010-09-14 NOTE — Assessment & Plan Note (Signed)
The patient has known carotid artery disease.  His Dopplers are followed regularly by his primary physician.

## 2010-09-14 NOTE — Assessment & Plan Note (Signed)
The patient has bradycardia.  This is unchanged from the past.  I've chosen not to change his medicines at this time.

## 2010-09-14 NOTE — Patient Instructions (Addendum)
Will request copy of echo results done by Dr. Sherril Croon in January. Follow up in  3-4 weeks - see appointment above.

## 2010-09-14 NOTE — Assessment & Plan Note (Signed)
There is a history of aortic valve sclerosis.  We will gather more data about his aortic valve with his echo.

## 2010-09-14 NOTE — Assessment & Plan Note (Signed)
Systolic blood pressure is mildly elevated today.  I will not change his medicines until I have his echo.

## 2010-09-22 ENCOUNTER — Encounter: Payer: Self-pay | Admitting: Cardiology

## 2010-10-12 ENCOUNTER — Ambulatory Visit (INDEPENDENT_AMBULATORY_CARE_PROVIDER_SITE_OTHER): Payer: Medicare Other | Admitting: Cardiology

## 2010-10-12 ENCOUNTER — Encounter: Payer: Self-pay | Admitting: Cardiology

## 2010-10-12 VITALS — BP 148/77 | HR 74 | Ht 71.0 in | Wt 192.0 lb

## 2010-10-12 DIAGNOSIS — R0602 Shortness of breath: Secondary | ICD-10-CM

## 2010-10-12 MED ORDER — FUROSEMIDE 20 MG PO TABS: 20.0000 mg | ORAL_TABLET | Freq: Every day | ORAL | Status: DC

## 2010-10-12 NOTE — Patient Instructions (Signed)
Follow up as scheduled. Start Lasix (furosemide) 20mg  daily. Your physician recommends that you go to the [facility] for lab work: BMET (do today).

## 2010-10-12 NOTE — Progress Notes (Signed)
HPI Seen for cardiology followup for cardiology followup.  I saw him last May 21 02012.  Complaints of shortness of breath when lying down.  It sounds like orthopnea.  However he has no definite evidence of other fluid overload.  He returns today the same symptom.  Since his last visit we received a copy of the echo done in January, 2012.  His ejection fraction was 50%.  He is very mild aortic insufficiency.  There is no mention of marked diastolic dysfunction.  Patient has had chronic dizziness unrelated to his cardiac issues.  He tells me interestingly that he fell recently when he was not dizzy.  He had a significant bruise to his left shoulder but no fracture.  He did fracture or rib.  Of interest is the fact that he says that he has not had his dizziness since then.  He does note that he has intermittently had periods with no dizziness in the past. Allergies  Allergen Reactions  . Sulfonamide Derivatives   . Tetracycline     Current Outpatient Prescriptions  Medication Sig Dispense Refill  . aspirin (ASPIR-TRIN) 325 MG EC tablet Take 325 mg by mouth daily.        . benazepril (LOTENSIN) 20 MG tablet Take 20 mg by mouth daily.        . digoxin (LANOXIN) 0.25 MG tablet Take 250 mcg by mouth daily.        Marland Kitchen glimepiride (AMARYL) 4 MG tablet Take 4 mg by mouth daily before breakfast.        . metFORMIN (GLUCOPHAGE) 500 MG tablet Take 1,000 mg by mouth 2 (two) times daily with a meal.        . metoprolol (TOPROL-XL) 50 MG 24 hr tablet Take 50 mg by mouth daily.        . Multiple Vitamin (MULTIVITAMIN) tablet Take 1 tablet by mouth daily.        . nitroGLYCERIN (NITROSTAT) 0.4 MG SL tablet Place 0.4 mg under the tongue every 5 (five) minutes as needed.        . Omega-3 Fatty Acids (FISH OIL) 1200 MG CAPS Take 1 capsule by mouth daily.        . simvastatin (ZOCOR) 40 MG tablet TAKE ONE TABLET BY MOUTH AT BEDTIME.  30 tablet  6    History   Social History  . Marital Status: Married    Spouse  Name: N/A    Number of Children: 4  . Years of Education: N/A   Occupational History  . RETIRED    Social History Main Topics  . Smoking status: Former Smoker -- 2.0 packs/day for 30 years    Types: Cigarettes    Quit date: 04/26/1980  . Smokeless tobacco: Never Used  . Alcohol Use: Not on file  . Drug Use: Not on file  . Sexually Active: Not on file   Other Topics Concern  . Not on file   Social History Narrative  . No narrative on file    Family History  Problem Relation Age of Onset  . Heart attack Mother 23    Past Medical History  Diagnosis Date  . Pneumonia     left lower lobe  . Hx of CABG   . Asbestos exposure   . Aortic sclerosis   . Dyslipidemia   . Nephrolithiasis   . PVD (peripheral vascular disease)   . Diabetes mellitus   . Renal cyst   . Colitis   . Hypertension   .  CAD (coronary artery disease) 2003    last catheterization 2003, potential ischemia in the ramus, grafts patent  ( last nuclear 2002)  . TIA (transient ischemic attack) 2007    ?? medical Rx   . Dizziness     He has had falls related to this  . Bradycardia     MILD  . Carotid artery disease 2001    BILATERAL 0-39%/  Follow with Dopplers by Dr. Sherril Croon  . SOB (shortness of breath)   . Ejection fraction < 50%     EF 40-45%..NUCLEAR 2003/ 50%.Marland KitchenECHO 12/1998  . Shortness of breath     May, 2012    Past Surgical History  Procedure Date  . Coronary artery bypass graft     ROS  Patient denies fever, chills, headache, sweats, rash, change in vision, change in hearing, chest pain, cough, nausea vomiting, urinary symptoms all of the systems are reviewed and are negative.  PHYSICAL EXAM Patient's quite stable today.  It is atraumatic.  He still has evidence of some bruise on his left shoulder.  Lungs are clear.  Respiratory effort is unlabored.  Cardiac exam reveals an S1-S2.  No clicks or significant murmurs.  Abdomen is soft.  There is no peripheral edema. Filed Vitals:   10/12/10  1408  BP: 148/77  Pulse: 74  Height: 5\' 11"  (1.803 m)  Weight: 192 lb (87.091 kg)  SpO2: 97%    EKG Is not done today.  ASSESSMENT & PLAN

## 2010-10-12 NOTE — Assessment & Plan Note (Signed)
I am puzzled by his history of shortness of breath.  On a repeated basis however if this occurs when he is lying down.  It may be orthopnea.  He says he feels better when he gets up and walks around.  We will start a very low dose of a diuretic.  I am hesitant about this with his dizziness history.  Also he may have had some mild orthostatic symptoms in the past.  He understands to be very careful.  Will use only 20 mg of Lasix.  If he gets any signs of worsening orthostatic changes he will stop the medicine.  I'll see him for followup.

## 2010-11-02 ENCOUNTER — Encounter: Payer: Self-pay | Admitting: Cardiology

## 2010-11-02 DIAGNOSIS — I739 Peripheral vascular disease, unspecified: Secondary | ICD-10-CM

## 2010-11-02 DIAGNOSIS — I779 Disorder of arteries and arterioles, unspecified: Secondary | ICD-10-CM | POA: Insufficient documentation

## 2010-11-02 DIAGNOSIS — R9430 Abnormal result of cardiovascular function study, unspecified: Secondary | ICD-10-CM | POA: Insufficient documentation

## 2010-11-03 ENCOUNTER — Encounter: Payer: Self-pay | Admitting: Cardiology

## 2010-11-03 ENCOUNTER — Ambulatory Visit (INDEPENDENT_AMBULATORY_CARE_PROVIDER_SITE_OTHER): Payer: Medicare Other | Admitting: Cardiology

## 2010-11-03 DIAGNOSIS — R0602 Shortness of breath: Secondary | ICD-10-CM

## 2010-11-03 DIAGNOSIS — I10 Essential (primary) hypertension: Secondary | ICD-10-CM

## 2010-11-03 MED ORDER — NITROGLYCERIN 0.4 MG SL SUBL: 0.4000 mg | SUBLINGUAL_TABLET | SUBLINGUAL | Status: DC | PRN

## 2010-11-03 NOTE — Patient Instructions (Signed)
Your physician wants you to follow-up in: 6 months. You will receive a reminder letter in the mail one-two months in advance. If you don't receive a letter, please call our office to schedule the follow-up appointment. Your physician recommends that you continue on your current medications as directed. Please refer to the Current Medication list given to you today. 

## 2010-11-03 NOTE — Progress Notes (Signed)
HPI Patient is seen for followup of shortness of breath.  I saw him last May 13, 2010.  He gave a history of shortness of breath that didn't sound like orthopnea.  He had no definite signs of fluid overload on physical exam.  We decided to use a small dose of Lasix, 20 mg daily.  He has lost a few pounds.  He is not having shortness of breath at night.  He feels good in general.  Fortunately this has not caused any worsening in the rare orthostatic symptoms that he has.  It is also of note that he has not had return of dizziness.  He thinks his dizziness improved when he fell and hurt his shoulder. Allergies  Allergen Reactions  . Sulfonamide Derivatives   . Tetracycline     Current Outpatient Prescriptions  Medication Sig Dispense Refill  . aspirin (ASPIR-TRIN) 325 MG EC tablet Take 325 mg by mouth daily.        . benazepril (LOTENSIN) 20 MG tablet Take 20 mg by mouth daily.        . digoxin (LANOXIN) 0.25 MG tablet Take 250 mcg by mouth daily.        . furosemide (LASIX) 20 MG tablet Take 1 tablet (20 mg total) by mouth daily.  30 tablet  6  . glimepiride (AMARYL) 4 MG tablet Take 4 mg by mouth daily before breakfast.        . metFORMIN (GLUCOPHAGE) 500 MG tablet Take 1,000 mg by mouth 2 (two) times daily with a meal.        . metoprolol (TOPROL-XL) 50 MG 24 hr tablet Take 50 mg by mouth daily.        . Multiple Vitamin (MULTIVITAMIN) tablet Take 1 tablet by mouth daily.        . nitroGLYCERIN (NITROSTAT) 0.4 MG SL tablet Place 0.4 mg under the tongue every 5 (five) minutes as needed.        . Omega-3 Fatty Acids (FISH OIL) 1200 MG CAPS Take 1 capsule by mouth daily.        . simvastatin (ZOCOR) 40 MG tablet TAKE ONE TABLET BY MOUTH AT BEDTIME.  30 tablet  6    History   Social History  . Marital Status: Married    Spouse Name: N/A    Number of Children: 4  . Years of Education: N/A   Occupational History  . RETIRED    Social History Main Topics  . Smoking status: Former  Smoker -- 2.0 packs/day for 30 years    Types: Cigarettes    Quit date: 04/26/1980  . Smokeless tobacco: Never Used  . Alcohol Use: Not on file  . Drug Use: Not on file  . Sexually Active: Not on file   Other Topics Concern  . Not on file   Social History Narrative  . No narrative on file    Family History  Problem Relation Age of Onset  . Heart attack Mother 3    Past Medical History  Diagnosis Date  . Pneumonia     left lower lobe  . Hx of CABG   . Asbestos exposure   . Dyslipidemia   . Nephrolithiasis   . PVD (peripheral vascular disease)   . Diabetes mellitus   . Renal cyst   . Colitis   . Hypertension   . CAD (coronary artery disease) 2003    last catheterization 2003, potential ischemia in the ramus, grafts patent  ( last nuclear  2002)  . TIA (transient ischemic attack) 2007    ?? medical Rx   . Dizziness     He has had falls related to this / Chronic dizziness  . Bradycardia     MILD  . Carotid artery disease 2001    BILATERAL 0-39%/  Follow with Dopplers by Dr. Sherril Croon  . SOB (shortness of breath)   . Ejection fraction     50%, echo, January, 2012, no mention of significant diastolic dysfunction  . Shortness of breath     May, 2012  . Aortic insufficiency     Mild, echo, January, 2012    Past Surgical History  Procedure Date  . Coronary artery bypass graft   . Percutaneous transluminal coronary angioplasty     hx    ROS  Patient denies fever, chills, headache, sweats, rash, change in vision, change in hearing, chest pain, cough, nausea vomiting, urinary symptoms.  He still has some discomfort in his left shoulder from his injury.  He can use his arm normally.  All other systems are reviewed and are negative.  PHYSICAL EXAM Patient is quite stable today.  He is oriented to person time and place.  Affect is normal.  Head is atraumatic.  Lungs are clear respiratory effort is nonlabored.  Cardiac exam reveals S1-S2.  No clicks or significant murmurs.   There is no jugular venous distention.  His abdomen is soft.  There is no peripheral edema. Filed Vitals:   11/03/10 1054  BP: 145/85  Pulse: 61  Height: 5\' 11"  (1.803 m)  Weight: 190 lb (86.183 kg)  SpO2: 94%    EKG Is not done today. ASSESSMENT & PLAN

## 2010-11-03 NOTE — Assessment & Plan Note (Signed)
Blood pressure is controlled. No change in therapy. 

## 2010-11-03 NOTE — Assessment & Plan Note (Signed)
It appears that there was a mild volume component to his shortness of breath.  He is not having shortness of breath when he lies down.  He will remain on a small dose of Lasix.  Since we had just started Lasix we will recheck his chemistry and potassium.  We'll see him back in 6 months.

## 2010-12-24 ENCOUNTER — Ambulatory Visit (INDEPENDENT_AMBULATORY_CARE_PROVIDER_SITE_OTHER): Payer: Medicare Other | Admitting: Otolaryngology

## 2010-12-24 DIAGNOSIS — R42 Dizziness and giddiness: Secondary | ICD-10-CM

## 2010-12-24 DIAGNOSIS — H814 Vertigo of central origin: Secondary | ICD-10-CM

## 2011-04-02 ENCOUNTER — Encounter (HOSPITAL_COMMUNITY): Payer: Self-pay | Admitting: *Deleted

## 2011-04-02 ENCOUNTER — Emergency Department (HOSPITAL_COMMUNITY): Payer: Medicare Other

## 2011-04-02 ENCOUNTER — Observation Stay (HOSPITAL_COMMUNITY)
Admission: AD | Admit: 2011-04-02 | Discharge: 2011-04-05 | Disposition: A | Discharge status: Home / Self Care | Payer: Medicare Other | Attending: Internal Medicine | Admitting: Internal Medicine

## 2011-04-02 DIAGNOSIS — E119 Type 2 diabetes mellitus without complications: Secondary | ICD-10-CM

## 2011-04-02 DIAGNOSIS — G459 Transient cerebral ischemic attack, unspecified: Secondary | ICD-10-CM

## 2011-04-02 DIAGNOSIS — I2510 Atherosclerotic heart disease of native coronary artery without angina pectoris: Secondary | ICD-10-CM

## 2011-04-02 DIAGNOSIS — R0602 Shortness of breath: Secondary | ICD-10-CM

## 2011-04-02 DIAGNOSIS — I10 Essential (primary) hypertension: Secondary | ICD-10-CM

## 2011-04-02 DIAGNOSIS — E785 Hyperlipidemia, unspecified: Secondary | ICD-10-CM

## 2011-04-02 DIAGNOSIS — M25569 Pain in unspecified knee: Secondary | ICD-10-CM | POA: Insufficient documentation

## 2011-04-02 DIAGNOSIS — Z951 Presence of aortocoronary bypass graft: Secondary | ICD-10-CM

## 2011-04-02 DIAGNOSIS — Z87442 Personal history of urinary calculi: Secondary | ICD-10-CM

## 2011-04-02 DIAGNOSIS — W208XXA Other cause of strike by thrown, projected or falling object, initial encounter: Secondary | ICD-10-CM | POA: Insufficient documentation

## 2011-04-02 DIAGNOSIS — R9430 Abnormal result of cardiovascular function study, unspecified: Secondary | ICD-10-CM

## 2011-04-02 DIAGNOSIS — R42 Dizziness and giddiness: Secondary | ICD-10-CM

## 2011-04-02 DIAGNOSIS — I779 Disorder of arteries and arterioles, unspecified: Secondary | ICD-10-CM

## 2011-04-02 DIAGNOSIS — R001 Bradycardia, unspecified: Secondary | ICD-10-CM

## 2011-04-02 DIAGNOSIS — S83106A Unspecified dislocation of unspecified knee, initial encounter: Principal | ICD-10-CM | POA: Insufficient documentation

## 2011-04-02 DIAGNOSIS — Z8719 Personal history of other diseases of the digestive system: Secondary | ICD-10-CM

## 2011-04-02 DIAGNOSIS — Y929 Unspecified place or not applicable: Secondary | ICD-10-CM | POA: Insufficient documentation

## 2011-04-02 DIAGNOSIS — Z77090 Contact with and (suspected) exposure to asbestos: Secondary | ICD-10-CM

## 2011-04-02 LAB — DIFFERENTIAL
Eosinophils Absolute: 0.1 10*3/uL (ref 0.0–0.7)
Eosinophils Relative: 1 % (ref 0–5)
Lymphs Abs: 1.4 10*3/uL (ref 0.7–4.0)
Monocytes Relative: 6 % (ref 3–12)

## 2011-04-02 LAB — BASIC METABOLIC PANEL
BUN: 21 mg/dL (ref 6–23)
CO2: 29 mEq/L (ref 19–32)
Calcium: 9.7 mg/dL (ref 8.4–10.5)
GFR calc non Af Amer: 67 mL/min — ABNORMAL LOW (ref >90)
Glucose, Bld: 113 mg/dL — ABNORMAL HIGH (ref 70–99)

## 2011-04-02 LAB — CBC
HCT: 40.7 % (ref 39.0–52.0)
Hemoglobin: 13.4 g/dL (ref 13.0–17.0)
MCH: 30.8 pg (ref 26.0–34.0)
MCV: 93.6 fL (ref 78.0–100.0)
RBC: 4.35 MIL/uL (ref 4.22–5.81)

## 2011-04-02 LAB — APTT: aPTT: 31 seconds (ref 24–37)

## 2011-04-02 MED ORDER — HYDROMORPHONE HCL PF 1 MG/ML IJ SOLN
INTRAMUSCULAR | Status: AC
  Administered 2011-04-02: 0.5 mg via INTRAVENOUS
  Filled 2011-04-02: qty 1

## 2011-04-02 MED ORDER — SODIUM CHLORIDE 0.9 % IV SOLN
Freq: Once | INTRAVENOUS | Status: AC
  Administered 2011-04-02: 20:00:00 via INTRAVENOUS

## 2011-04-02 MED ORDER — HYDROMORPHONE HCL PF 1 MG/ML IJ SOLN: 1.0000 mg | Freq: Once | INTRAMUSCULAR | Status: DC

## 2011-04-02 MED ORDER — MORPHINE SULFATE 2 MG/ML IJ SOLN
INTRAMUSCULAR | Status: AC
  Administered 2011-04-02: 2 mg via INTRAVENOUS
  Filled 2011-04-02: qty 1

## 2011-04-02 MED ORDER — KETOROLAC TROMETHAMINE 30 MG/ML IJ SOLN
15.0000 mg | Freq: Once | INTRAMUSCULAR | Status: AC
  Administered 2011-04-02: 15 mg via INTRAVENOUS
  Filled 2011-04-02: qty 1

## 2011-04-02 NOTE — ED Notes (Signed)
Alert, talking, no distress. 

## 2011-04-02 NOTE — H&P (Signed)
PCP:   Ignatius Specking., MD, MD   Chief Complaint:  Trauma to right lower extremity  HPI: [name] is a pleasant 77 year old male who was read doing his floor today when a bunch of heavy construction material that weighed over 500 pounds fell onto his right leg. Patient came to the ED in extreme pain and had this dislocated his right knee. Dr. Tiburcio Pea in orthopedic surgery has already reduced the right knee and recommended that he be transferred to [facility] for close observation for any further complications that might develop due to the severity of the injury particularly compartment syndrome. He currently is resting comfortably in denies any pain and has been in his normal state of health prior to this traumatic injury.  Review of Systems:  Otherwise negative  Past Medical History: Past Medical History  Diagnosis Date  . Pneumonia     left lower lobe  . Hx of CABG   . Asbestos exposure   . Dyslipidemia   . Nephrolithiasis   . PVD (peripheral vascular disease)   . Diabetes mellitus   . Renal cyst   . Colitis   . Hypertension   . CAD (coronary artery disease) 2003    last catheterization 2003, potential ischemia in the ramus, grafts patent  ( last nuclear 2002)  . TIA (transient ischemic attack) 2007    ?? medical Rx   . Dizziness     He has had falls related to this / Chronic dizziness  . Bradycardia     MILD  . Carotid artery disease 2001    BILATERAL 0-39%/  Follow with Dopplers by Dr. Sherril Croon  . SOB (shortness of breath)   . Ejection fraction     50%, echo, January, 2012, no mention of significant diastolic dysfunction  . Shortness of breath     May, 2012  . Aortic insufficiency     Mild, echo, January, 2012   Past Surgical History  Procedure Date  . Coronary artery bypass graft   . Percutaneous transluminal coronary angioplasty     hx    Medications: Prior to Admission medications   Medication Sig Start Date End Date Taking? Authorizing Provider  aspirin  (ASPIR-TRIN) 325 MG EC tablet Take 325 mg by mouth daily.     Yes Historical Provider, MD  benazepril (LOTENSIN) 20 MG tablet Take 20 mg by mouth daily.     Yes Historical Provider, MD  digoxin (LANOXIN) 0.25 MG tablet Take 250 mcg by mouth daily.     Yes Historical Provider, MD  furosemide (LASIX) 20 MG tablet Take 20 mg by mouth daily as needed. For fluid  10/12/10 10/12/11 Yes Luis Abed, MD  glimepiride (AMARYL) 4 MG tablet Take 4 mg by mouth daily before breakfast.     Yes Historical Provider, MD  metFORMIN (GLUCOPHAGE) 500 MG tablet Take 1,000 mg by mouth 2 (two) times daily with a meal.     Yes Historical Provider, MD  metoprolol tartrate (LOPRESSOR) 25 MG tablet Take 25 mg by mouth 2 (two) times daily.     Yes Historical Provider, MD  Multiple Vitamin (MULTIVITAMIN) tablet Take 1 tablet by mouth daily.     Yes Historical Provider, MD  Omega-3 Fatty Acids (FISH OIL) 1200 MG CAPS Take 1 capsule by mouth daily.     Yes Historical Provider, MD  simvastatin (ZOCOR) 40 MG tablet TAKE ONE TABLET BY MOUTH AT BEDTIME. 08/25/10  Yes Luis Abed, MD  diazepam (VALIUM) 5 MG tablet  Take 5 mg by mouth at bedtime as needed. For nerves     Historical Provider, MD  nitroGLYCERIN (NITROSTAT) 0.4 MG SL tablet Place 1 tablet (0.4 mg total) under the tongue every 5 (five) minutes as needed. 11/03/10   Luis Abed, MD    Allergies:   Allergies  Allergen Reactions  . Sulfonamide Derivatives   . Tetracycline     Social History:  reports that he quit smoking about 30 years ago. His smoking use included Cigarettes. He has a 60 pack-year smoking history. He has never used smokeless tobacco. His alcohol and drug histories not on file.  Family History: Family History  Problem Relation Age of Onset  . Heart attack Mother 39    Physical Exam: Filed Vitals:   04/02/11 1922 04/02/11 1923 04/02/11 1952 04/02/11 2057  BP: 151/60 161/57 148/58 141/49  Pulse: 64 66 74 78  Temp:      TempSrc:      Resp:       Height:      Weight:      SpO2: 98% 100% 98% 96%   General appearance: alert, cooperative and no distress Resp: clear to auscultation bilaterally Cardio: regular rate and rhythm, S1, S2 normal, no murmur, click, rub or gallop GI: soft, non-tender; bowel sounds normal; no masses,  no organomegaly Extremities: extremities normal, atraumatic, no cyanosis or edema  rle in splint Pulses: 2+ and symmetric Skin: Skin color, texture, turgor normal. No rashes or lesions Neurologic: Grossly normal   Labs on Admission:   [facility] 04/02/11 1915  NA 140  K 4.3  CL 99  CO2 29  GLUCOSE 113*  BUN 21  CREATININE 1.01  CALCIUM 9.7  MG --  PHOS --    Basename 04/02/11 1915  WBC 19.8*  NEUTROABS 17.0*  HGB 13.4  HCT 40.7  MCV 93.6  PLT 246     Radiological Exams on Admission: Dg Knee 1-2 Views Right  04/02/2011  *RADIOLOGY REPORT*  Clinical Data: Post reduction right knee dislocation.  PORTABLE RIGHT KNEE - 1-2 VIEW 04/12/2011 1927 hours:  Comparison: Right knee x-rays earlier same date 1834 hours.  Findings: Anatomic alignment of the right knee post reduction. Small bone fragment adjacent to the lateral femoral condyle.  No evidence of fracture elsewhere.  Joint spaces well preserved for age.  IMPRESSION: Anatomic alignment post reduction.  Probable avulsion fracture involving the lateral femoral condyle at or near the insertion of the lateral collateral ligament.  Original Report Authenticated By: Arnell Sieving, M.D.   Dg Knee 1-2 Views Right  04/02/2011  *RADIOLOGY REPORT*  Clinical Data: Injury, pain.  RIGHT KNEE - 1-2 VIEW  Comparison: None.  Findings: Positioning is nonstandard.  There appears to be anterior dislocation of the right knee.  No fracture is identified.  IMPRESSION: Anterior dislocation right knee.  No fracture.  Original Report Authenticated By: Bernadene Bell. Maricela Curet, M.D.   Dg Tibia/fibula Right  04/02/2011  *RADIOLOGY REPORT*  Clinical Data: Fall, pain.   RIGHT TIBIA AND FIBULA - 2 VIEW  Comparison: None.  Findings: No acute bony or joint abnormality is identified. Vascular clips in the medial aspect of the thigh noted.  IMPRESSION: No acute finding.  Original Report Authenticated By: Bernadene Bell. Maricela Curet, M.D.    Assessment/Plan Present on Admission:   77 year old male with traumatic injury to right lower extremity status post reduction of a displaced anterior knee by Dr. Romeo Apple. #1 traumatic injury to right lower extremity with over 500 pounds  of weight patient be transferred to [facility] per request by Dr. Romeo Apple. The EDP here at [facility] has already called orthopedic surgeon on call at [facility] who are aware of the consultation. We'll obtain neurovascular checks and his lower extremities every 4 hours. Monitor very closely for any development of complications. Currently there are none. We'll place him on IV fluids overnight and obtain CPK levels and monitor those every 12 hours. #2 history of coronary artery disease status post CABG this is stable continue his home medications with the exception of holding his aspirin products. #3 hypertension stable #4 diabetes hold his metformin at this time.    [name] A 04/02/2011, 9:06 PM

## 2011-04-02 NOTE — ED Notes (Signed)
Good pedal pulses

## 2011-04-02 NOTE — ED Notes (Signed)
Ice pack to knee

## 2011-04-02 NOTE — ED Provider Notes (Addendum)
Scribed for Jesse Bonier, MD, the patient was seen in room APA04/APA04 . This chart was scribed by Ellie Lunch.    CSN: [id number] Arrival date & time: 04/02/2011  6:22 PM   First MD Initiated Contact with Patient 04/02/11 1823      Chief Complaint  Patient presents with  . Knee Injury    (Consider location/radiation/quality/duration/timing/severity/associated sxs/prior treatment) Patient is a 77 y.o. male presenting with knee pain. The history is provided by the patient. No language interpreter was used.  Knee Pain This is a new problem. The current episode started 3 to 5 hours ago. The problem occurs constantly. The problem has not changed since onset.Exacerbated by: moving and palpation. He has tried nothing for the symptoms.  Pt seen at 6:51 pm [name] is a 77 y.o. male who presents to the Emergency Department complaining of a right  knee pain just PTA. Patient was trying to move boards when ~600 lb of boards fell on his legs. Right leg was splinted by EMS.  Past Medical History  Diagnosis Date  . Pneumonia     left lower lobe  . Hx of CABG   . Asbestos exposure   . Dyslipidemia   . Nephrolithiasis   . PVD (peripheral vascular disease)   . Diabetes mellitus   . Renal cyst   . Colitis   . Hypertension   . CAD (coronary artery disease) 2003    last catheterization 2003, potential ischemia in the ramus, grafts patent  ( last nuclear 2002)  . TIA (transient ischemic attack) 2007    ?? medical Rx   . Dizziness     He has had falls related to this / Chronic dizziness  . Bradycardia     MILD  . Carotid artery disease 2001    BILATERAL 0-39%/  Follow with Dopplers by Dr. Sherril Croon  . SOB (shortness of breath)   . Ejection fraction     50%, echo, January, 2012, no mention of significant diastolic dysfunction  . Shortness of breath     May, 2012  . Aortic insufficiency     Mild, echo, January, 2012    Past Surgical History  Procedure Date  . Coronary artery  bypass graft   . Percutaneous transluminal coronary angioplasty     hx    Family History  Problem Relation Age of Onset  . Heart attack Mother 78    History  Substance Use Topics  . Smoking status: Former Smoker -- 2.0 packs/day for 30 years    Types: Cigarettes    Quit date: 04/26/1980  . Smokeless tobacco: Never Used  . Alcohol Use: Not on file     Review of Systems 10 Systems reviewed and are negative for acute change except as noted in the HPI.  Allergies  Sulfonamide derivatives and Tetracycline  Home Medications   Current Outpatient Rx  Name Route Sig Dispense Refill  . ASPIRIN 325 MG PO TBEC Oral Take 325 mg by mouth daily.      Marland Kitchen BENAZEPRIL HCL 20 MG PO TABS Oral Take 20 mg by mouth daily.      Marland Kitchen DIGOXIN 0.25 MG PO TABS Oral Take 250 mcg by mouth daily.      . FUROSEMIDE 20 MG PO TABS Oral Take 1 tablet (20 mg total) by mouth daily. 30 tablet 6  . GLIMEPIRIDE 4 MG PO TABS Oral Take 4 mg by mouth daily before breakfast.      . METFORMIN HCL 500  MG PO TABS Oral Take 1,000 mg by mouth 2 (two) times daily with a meal.      . METOPROLOL SUCCINATE ER 50 MG PO TB24 Oral Take 50 mg by mouth daily.      Marland Kitchen ONE-DAILY MULTI VITAMINS PO TABS Oral Take 1 tablet by mouth daily.      Marland Kitchen NITROGLYCERIN 0.4 MG SL SUBL Sublingual Place 1 tablet (0.4 mg total) under the tongue every 5 (five) minutes as needed. 25 tablet 3  . FISH OIL 1200 MG PO CAPS Oral Take 1 capsule by mouth daily.      Marland Kitchen SIMVASTATIN 40 MG PO TABS  TAKE ONE TABLET BY MOUTH AT BEDTIME. 30 tablet 6    BP 160/69  Pulse 70  Temp(Src) 97.2 F (36.2 C) (Oral)  Resp 16  Ht 5\' 11"  (1.803 m)  Wt 195 lb (88.451 kg)  BMI 27.20 kg/m2  SpO2 97%  Physical Exam  Nursing note and vitals reviewed. Constitutional: He is oriented to person, place, and time. He appears well-developed and well-nourished.  Cardiovascular: Normal rate, regular rhythm and normal heart sounds.  Exam reveals no gallop and no friction rub.   No  murmur heard.      Clear to auscultation  Pulmonary/Chest: Effort normal.  Abdominal: Soft. Bowel sounds are normal. He exhibits no distension.       Hips are nontender  Musculoskeletal:       Right knee: He exhibits swelling. tenderness found.       Distally in the right foot has an intact dorsalis and pedalis pulse. Distally in the right foot has a intact dorsalis pedis and tibialis pulse. Movement and sensation still present to toes. Obvious abnormality in the knee as expected in dislocation.  Hip stable upon compression.  Neurological: He is alert and oriented to person, place, and time.  Skin: Skin is warm and dry.    ED Course  Procedures (including critical care time) DIAGNOSTIC STUDIES: Oxygen Saturation is 97% on room air, normal by my interpretation.    COORDINATION OF CARE:  Labs Reviewed - No data to display Dg Knee 1-2 Views Right  04/02/2011  *RADIOLOGY REPORT*  Clinical Data: Post reduction right knee dislocation.  PORTABLE RIGHT KNEE - 1-2 VIEW 04/12/2011 1927 hours:  Comparison: Right knee x-rays earlier same date 1834 hours.  Findings: Anatomic alignment of the right knee post reduction. Small bone fragment adjacent to the lateral femoral condyle.  No evidence of fracture elsewhere.  Joint spaces well preserved for age.  IMPRESSION: Anatomic alignment post reduction.  Probable avulsion fracture involving the lateral femoral condyle at or near the insertion of the lateral collateral ligament.  Original Report Authenticated By: Arnell Sieving, M.D.   Dg Knee 1-2 Views Right  04/02/2011  *RADIOLOGY REPORT*  Clinical Data: Injury, pain.  RIGHT KNEE - 1-2 VIEW  Comparison: None.  Findings: Positioning is nonstandard.  There appears to be anterior dislocation of the right knee.  No fracture is identified.  IMPRESSION: Anterior dislocation right knee.  No fracture.  Original Report Authenticated By: Bernadene Bell. Maricela Curet, M.D.   Dg Tibia/fibula Right  04/02/2011  *RADIOLOGY  REPORT*  Clinical Data: Fall, pain.  RIGHT TIBIA AND FIBULA - 2 VIEW  Comparison: None.  Findings: No acute bony or joint abnormality is identified. Vascular clips in the medial aspect of the thigh noted.  IMPRESSION: No acute finding.  Original Report Authenticated By: Bernadene Bell. D'ALESSIO, M.D.   ED MEDICATIONS Medications  HYDROmorphone (DILAUDID)  injection 1 mg (not administered)  ketorolac (TORADOL) 30 MG/ML injection 15 mg (not administered)  HYDROmorphone (DILAUDID) 1 MG/ML injection (not administered)   18:55 DDx includes dislocation of the knee, fracture of the right lower extremity. History and PE findings do not suggest any other injuries.  7:10 PM I discussed the x-ray findings and clinical findings with Dr. Romeo Apple of on-call orthopedics who is coming to attempt to reduce the dislocation of the right knee. He has requested a knee immobilizer to be at bedside to apply after reduction. He also advises that the patient will need transfer and admission to [facility] orthopedics for further management and possibly further studies to include possibly an arteriogram and observation to assure no development of compartment syndrome.  7:47 PM. Pt recheck. EDP discussed with PT that Dr. Romeo Apple was able to reduce knee. Xray shows no broken bones. Discussed plan to admit at [facility] to avoid complications. Pt agrees to plan.   9:15 PM I've spoken with [facility] hospitalist group to help arrange transfer and admission to their service at [facility] and I have called and spoken with Dr. Magnus Ivan of orthopedic surgery who has agreed to be available for consultation to the admitting service at [facility] for orthopedic injury and to assure there is no development of compartment syndrome. Dr. Magnus Ivan requested that I consult vascular surgery to evaluate the patient for vascular injury. There is no sign of vascular injury at this time and no sign of compartment syndrome.  11:23 PM I  spoke by telephone with Dr. Myra Gianotti of vascular surgery who I apprised of the patient's history and physical findings, which at this time only suggest the possibility for development of a vascular injury, however which do not at this time suggest a current vascular injury. Dr. Myra Gianotti stated that he is available for consultation at the request of the admitting physician team. MDM  Right lower extremity fracture, right knee dislocation, at this time there is no apparent arterial insufficiency or compartment syndrome.   I personally performed the services described in this documentation, which was scribed in my presence. The recorded information has been reviewed and considered.     Jesse Bonier, MD 04/02/11 2009  Jesse Bonier, MD 04/02/11 2116  Jesse Bonier, MD 04/02/11 [phone]

## 2011-04-02 NOTE — Procedures (Signed)
Due to the urgency of the situation, knee dislocation, no consent form was obtained, but verbal consent was obtained.  Site was verified by obvious, deformity.  With traction countertraction and flexion the knee was reduced   Repeat exam confirmed DP pulse normal , color and perfusion normal toes moved, ankle DF intact, Great toe extension intact   Sensation improved to normal   Splint applied in 10 degrees flexion  Repeat film confirmed reduction

## 2011-04-02 NOTE — ED Notes (Signed)
Pt was trying to move boards and ~ 600lb of boards fell on legs. Pain and deformity to right knee. Occurred just PTA.  LSB. Right leg splinted by EMS.

## 2011-04-02 NOTE — ED Notes (Signed)
Dr Romeo Apple in ,  Pt placed on monitor, 02, IV infusing well.  MD says this is an emergency and no consent needed . Fear of losing pts leg..  Dislocation resolved, reduced.  Knee immobilizer applied.  Good pedal pulses.  Sinus rhythm

## 2011-04-02 NOTE — ED Notes (Signed)
Sinus rhythm, Good pedal pulses, KI in place.  Alert,talking, good movement of toes.

## 2011-04-02 NOTE — ED Notes (Signed)
Dr Onalee Hua, hospitalist in to see pt

## 2011-04-02 NOTE — Consult Note (Signed)
Reason for Consult:right knee dislocation Referring Physician: Najeh Credit is an 77 y.o. male.  HPI: see er report   Basically heavy floor boards fell on him ~ 4 pm  EMS brought to ER with knee dislocation  Past Medical History  Diagnosis Date  . Pneumonia     left lower lobe  . Hx of CABG   . Asbestos exposure   . Dyslipidemia   . Nephrolithiasis   . PVD (peripheral vascular disease)   . Diabetes mellitus   . Renal cyst   . Colitis   . Hypertension   . CAD (coronary artery disease) 2003    last catheterization 2003, potential ischemia in the ramus, grafts patent  ( last nuclear 2002)  . TIA (transient ischemic attack) 2007    ?? medical Rx   . Dizziness     He has had falls related to this / Chronic dizziness  . Bradycardia     MILD  . Carotid artery disease 2001    BILATERAL 0-39%/  Follow with Dopplers by Dr. Sherril Croon  . SOB (shortness of breath)   . Ejection fraction     50%, echo, January, 2012, no mention of significant diastolic dysfunction  . Shortness of breath     May, 2012  . Aortic insufficiency     Mild, echo, January, 2012    Past Surgical History  Procedure Date  . Coronary artery bypass graft   . Percutaneous transluminal coronary angioplasty     hx    Family History  Problem Relation Age of Onset  . Heart attack Mother 35    Social History:  reports that he quit smoking about 30 years ago. His smoking use included Cigarettes. He has a 60 pack-year smoking history. He has never used smokeless tobacco. His alcohol and drug histories not on file.  Allergies:  Allergies  Allergen Reactions  . Sulfonamide Derivatives   . Tetracycline     Medications: I have reviewed the patient's current medications.  No results found for this or any previous visit (from the past 48 hour(s)).  Dg Knee 1-2 Views Right  04/02/2011  *RADIOLOGY REPORT*  Clinical Data: Injury, pain.  RIGHT KNEE - 1-2 VIEW  Comparison: None.  Findings: Positioning is  nonstandard.  There appears to be anterior dislocation of the right knee.  No fracture is identified.  IMPRESSION: Anterior dislocation right knee.  No fracture.  Original Report Authenticated By: Bernadene Bell. Maricela Curet, M.D.   Dg Tibia/fibula Right  04/02/2011  *RADIOLOGY REPORT*  Clinical Data: Fall, pain.  RIGHT TIBIA AND FIBULA - 2 VIEW  Comparison: None.  Findings: No acute bony or joint abnormality is identified. Vascular clips in the medial aspect of the thigh noted.  IMPRESSION: No acute finding.  Original Report Authenticated By: Bernadene Bell. Maricela Curet, M.D.    Review of Systems  Constitutional: Negative.   HENT: Negative.   Eyes: Negative.   Respiratory: Negative.   Cardiovascular: Negative.   Gastrointestinal: Negative.   Musculoskeletal: Positive for joint pain and falls.  Skin: Negative.   Neurological: Positive for dizziness and tingling.  Endo/Heme/Allergies: Negative.   Psychiatric/Behavioral: Negative.    Blood pressure 151/60, pulse 64, temperature 97.2 F (36.2 C), temperature source Oral, resp. rate 16, height 5\' 11"  (1.803 m), weight 88.451 kg (195 lb), SpO2 98.00%. Physical Exam  Constitutional: He is oriented to person, place, and time. He appears well-developed and well-nourished. He appears distressed.  HENT:  Head: Normocephalic and atraumatic.  Eyes: Pupils are equal, round, and reactive to light.  Neck: Normal range of motion. Neck supple.  Respiratory: Effort normal.  Musculoskeletal:       Right knee: He exhibits decreased range of motion, swelling, ecchymosis, deformity, abnormal alignment and bony tenderness. tenderness found.       Legs: Neurological: He is alert and oriented to person, place, and time.  Skin: Skin is warm and dry. No pallor.  Psychiatric: He has a normal mood and affect.  the DP and PT are intact and color is normal   Assessment/Plan: Knee dislocation anterior lateral (tib with respect to femur)  Rec:  closed reduction  Then  transfer where vascular consult can be done with appropraite monitoring and cardiology support with his history   Fuller Canada 04/02/2011, 7:23 PM

## 2011-04-02 NOTE — ED Notes (Signed)
Med for pain given.

## 2011-04-02 NOTE — ED Notes (Signed)
Deformity to rt knee, with severe pain. Alert, No LOC,  Concrete slabs fell on pt and trapped him for 30 min until got cell phone.  Denies furthur injury.  Skin warm and dry

## 2011-04-02 NOTE — ED Notes (Signed)
Beeped hospitalist to [phone].

## 2011-04-03 DIAGNOSIS — S83106A Unspecified dislocation of unspecified knee, initial encounter: Secondary | ICD-10-CM

## 2011-04-03 LAB — BASIC METABOLIC PANEL
BUN: 22 mg/dL (ref 6–23)
Calcium: 8.9 mg/dL (ref 8.4–10.5)
GFR calc Af Amer: 89 mL/min — ABNORMAL LOW (ref >90)
GFR calc non Af Amer: 77 mL/min — ABNORMAL LOW (ref >90)
Potassium: 4.1 mEq/L (ref 3.5–5.1)

## 2011-04-03 LAB — CBC
Hemoglobin: 12.4 g/dL — ABNORMAL LOW (ref 13.0–17.0)
MCH: 30.2 pg (ref 26.0–34.0)
MCHC: 33.1 g/dL (ref 30.0–36.0)
Platelets: 223 10*3/uL (ref 150–400)
RDW: 13.2 % (ref 11.5–15.5)

## 2011-04-03 LAB — GLUCOSE, CAPILLARY
Glucose-Capillary: 162 mg/dL — ABNORMAL HIGH (ref 70–99)
Glucose-Capillary: 106 mg/dL — ABNORMAL HIGH (ref 70–99)

## 2011-04-03 LAB — CK
Total CK: 1225 U/L — ABNORMAL HIGH (ref 7–232)
Total CK: 1081 U/L — ABNORMAL HIGH (ref 7–232)

## 2011-04-03 MED ORDER — ONDANSETRON HCL 4 MG/2ML IJ SOLN: 4.0000 mg | Freq: Four times a day (QID) | INTRAMUSCULAR | Status: DC | PRN

## 2011-04-03 MED ORDER — FUROSEMIDE 20 MG PO TABS
20.0000 mg | ORAL_TABLET | Freq: Every day | ORAL | Status: DC
  Administered 2011-04-03 – 2011-04-05 (×3): 20 mg via ORAL
  Filled 2011-04-03 (×3): qty 1

## 2011-04-03 MED ORDER — SODIUM CHLORIDE 0.9 % IV SOLN
INTRAVENOUS | Status: AC
  Administered 2011-04-03 (×2): via INTRAVENOUS

## 2011-04-03 MED ORDER — ONDANSETRON HCL 4 MG PO TABS: 4.0000 mg | ORAL_TABLET | Freq: Four times a day (QID) | ORAL | Status: DC | PRN

## 2011-04-03 MED ORDER — GLIMEPIRIDE 4 MG PO TABS
4.0000 mg | ORAL_TABLET | Freq: Every day | ORAL | Status: DC
Start: 2011-04-03 — End: 2011-04-05
  Administered 2011-04-03 – 2011-04-05 (×3): 4 mg via ORAL
  Filled 2011-04-03 (×4): qty 1

## 2011-04-03 MED ORDER — BENAZEPRIL HCL 20 MG PO TABS
20.0000 mg | ORAL_TABLET | Freq: Every day | ORAL | Status: DC
  Administered 2011-04-03 – 2011-04-05 (×3): 20 mg via ORAL
  Filled 2011-04-03 (×3): qty 1

## 2011-04-03 MED ORDER — DIAZEPAM 5 MG PO TABS: 5.0000 mg | ORAL_TABLET | Freq: Every evening | ORAL | Status: DC | PRN

## 2011-04-03 MED ORDER — SIMVASTATIN 40 MG PO TABS
40.0000 mg | ORAL_TABLET | Freq: Every day | ORAL | Status: DC
  Administered 2011-04-03 – 2011-04-04 (×3): 40 mg via ORAL
  Filled 2011-04-03 (×4): qty 1

## 2011-04-03 MED ORDER — HYDROCODONE-ACETAMINOPHEN 5-325 MG PO TABS
1.0000 | ORAL_TABLET | Freq: Four times a day (QID) | ORAL | Status: DC | PRN
  Administered 2011-04-03: 1 via ORAL
  Administered 2011-04-03 – 2011-04-05 (×5): 2 via ORAL
  Filled 2011-04-03 (×7): qty 2

## 2011-04-03 MED ORDER — MORPHINE SULFATE 2 MG/ML IJ SOLN
1.0000 mg | INTRAMUSCULAR | Status: DC | PRN
  Administered 2011-04-03 (×4): 1 mg via INTRAVENOUS
  Filled 2011-04-03 (×5): qty 1

## 2011-04-03 MED ORDER — METOPROLOL TARTRATE 25 MG PO TABS
25.0000 mg | ORAL_TABLET | Freq: Two times a day (BID) | ORAL | Status: DC
Start: 2011-04-03 — End: 2011-04-05
  Administered 2011-04-03 – 2011-04-05 (×6): 25 mg via ORAL
  Filled 2011-04-03 (×7): qty 1

## 2011-04-03 MED ORDER — DIGOXIN 250 MCG PO TABS
250.0000 ug | ORAL_TABLET | Freq: Every day | ORAL | Status: DC
  Administered 2011-04-03 – 2011-04-05 (×3): 250 ug via ORAL
  Filled 2011-04-03 (×3): qty 1

## 2011-04-03 NOTE — Consult Note (Signed)
Reason for Consult: History of right knee dislocation, r/o compartment syndrome Referring Physician: [facility] .  HPI: 77 yo male who had very heavy object fall on his right knee causing an acute knee dislocation.  He was seen at [facility] in [place] aand the knee dislocation was reduced by Dr. Romeo Apple, local orthopedist.  He then recommended the patient be shipped down to [facility] to follow for compartment syndrome and the possibility of a vascular injury.  I was called by the ER physician who relayed the story.  From my standpoint, the dislocation was already reduced and I recommended either the local ortho MD follow for compartment syndrome or vascular surgery could be called to further evaluate for a vascular injury. Non of this occurred and the patient was transferred down here to the hospitalist/medical service.  They have now called me as a consult to r/o compartment syndrome.  It is now greater than 12 hours post his original injury and closed reduction.  He reports right knee pain only and denies numbness in his right foot.   Past Medical History  Diagnosis Date  . Pneumonia     left lower lobe  . Hx of CABG   . Asbestos exposure   . Dyslipidemia   . Nephrolithiasis   . PVD (peripheral vascular disease)   . Diabetes mellitus   . Renal cyst   . Colitis   . Hypertension   . CAD (coronary artery disease) 2003    last catheterization 2003, potential ischemia in the ramus, grafts patent  ( last nuclear 2002)  . TIA (transient ischemic attack) 2007    ?? medical Rx   . Dizziness     He has had falls related to this / Chronic dizziness  . Bradycardia     MILD  . Carotid artery disease 2001    BILATERAL 0-39%/  Follow with Dopplers by Dr. Sherril Croon  . SOB (shortness of breath)   . Ejection fraction     50%, echo, January, 2012, no mention of significant diastolic dysfunction  . Shortness of breath     May, 2012  . Aortic insufficiency     Mild, echo, January,  2012    Past Surgical History  Procedure Date  . Coronary artery bypass graft   . Percutaneous transluminal coronary angioplasty     hx    Family History  Problem Relation Age of Onset  . Heart attack Mother 76    Social History:  reports that he quit smoking about 30 years ago. His smoking use included Cigarettes. He has a 60 pack-year smoking history. He has never used smokeless tobacco. His alcohol and drug histories not on file.  Allergies:  Allergies  Allergen Reactions  . Sulfonamide Derivatives   . Tetracycline     Medications: I have reviewed the patient's current medications.  Results for orders placed during the hospital encounter of 04/02/11 (from the past 48 hour(s))  CBC     Status: Abnormal   Collection Time   04/02/11  7:15 PM      Component Value Range Comment   WBC 19.8 (*) 4.0 - 10.5 (K/uL)    RBC 4.35  4.22 - 5.81 (MIL/uL)    Hemoglobin 13.4  13.0 - 17.0 (g/dL)    HCT 82.9  56.2 - 13.0 (%)    MCV 93.6  78.0 - 100.0 (fL)    MCH 30.8  26.0 - 34.0 (pg)    MCHC 32.9  30.0 - 36.0 (  g/dL)    RDW 16.1  09.6 - 04.5 (%)    Platelets 246  150 - 400 (K/uL)   DIFFERENTIAL     Status: Abnormal   Collection Time   04/02/11  7:15 PM      Component Value Range Comment   Neutrophils Relative 86 (*) 43 - 77 (%)    Neutro Abs 17.0 (*) 1.7 - 7.7 (K/uL)    Lymphocytes Relative 7 (*) 12 - 46 (%)    Lymphs Abs 1.4  0.7 - 4.0 (K/uL)    Monocytes Relative 6  3 - 12 (%)    Monocytes Absolute 1.2 (*) 0.1 - 1.0 (K/uL)    Eosinophils Relative 1  0 - 5 (%)    Eosinophils Absolute 0.1  0.0 - 0.7 (K/uL)    Basophils Relative 0  0 - 1 (%)    Basophils Absolute 0.1  0.0 - 0.1 (K/uL)   BASIC METABOLIC PANEL     Status: Abnormal   Collection Time   04/02/11  7:15 PM      Component Value Range Comment   Sodium 140  135 - 145 (mEq/L)    Potassium 4.3  3.5 - 5.1 (mEq/L)    Chloride 99  96 - 112 (mEq/L)    CO2 29  19 - 32 (mEq/L)    Glucose, Bld 113 (*) 70 - 99 (mg/dL)    BUN 21   6 - 23 (mg/dL)    Creatinine, Ser 4.09  0.50 - 1.35 (mg/dL)    Calcium 9.7  8.4 - 10.5 (mg/dL)    GFR calc non Af Amer 67 (*) >90 (mL/min)    GFR calc Af Amer 78 (*) >90 (mL/min)   PROTIME-INR     Status: Normal   Collection Time   04/02/11  7:15 PM      Component Value Range Comment   Prothrombin Time 14.8  11.6 - 15.2 (seconds)    INR 1.14  0.00 - 1.49    APTT     Status: Normal   Collection Time   04/02/11  7:15 PM      Component Value Range Comment   aPTT 31  24 - 37 (seconds)   BASIC METABOLIC PANEL     Status: Abnormal   Collection Time   04/03/11  1:52 AM      Component Value Range Comment   Sodium 138  135 - 145 (mEq/L)    Potassium 4.1  3.5 - 5.1 (mEq/L)    Chloride 100  96 - 112 (mEq/L)    CO2 27  19 - 32 (mEq/L)    Glucose, Bld 100 (*) 70 - 99 (mg/dL)    BUN 22  6 - 23 (mg/dL)    Creatinine, Ser 8.11  0.50 - 1.35 (mg/dL)    Calcium 8.9  8.4 - 10.5 (mg/dL)    GFR calc non Af Amer 77 (*) >90 (mL/min)    GFR calc Af Amer 89 (*) >90 (mL/min)   CBC     Status: Abnormal   Collection Time   04/03/11  1:52 AM      Component Value Range Comment   WBC 11.9 (*) 4.0 - 10.5 (K/uL)    RBC 4.11 (*) 4.22 - 5.81 (MIL/uL)    Hemoglobin 12.4 (*) 13.0 - 17.0 (g/dL)    HCT 91.4 (*) 78.2 - 52.0 (%)    MCV 91.2  78.0 - 100.0 (fL)    MCH 30.2  26.0 - 34.0 (pg)  MCHC 33.1  30.0 - 36.0 (g/dL)    RDW 16.1  09.6 - 04.5 (%)    Platelets 223  150 - 400 (K/uL)   CK     Status: Abnormal   Collection Time   04/03/11  1:52 AM      Component Value Range Comment   Total CK 1225 (*) 7 - 232 (U/L)   GLUCOSE, CAPILLARY     Status: Abnormal   Collection Time   04/03/11  7:58 AM      Component Value Range Comment   Glucose-Capillary 111 (*) 70 - 99 (mg/dL)    Comment 1 Documented in Chart      Comment 2 Notify RN     GLUCOSE, CAPILLARY     Status: Abnormal   Collection Time   04/03/11 11:47 AM      Component Value Range Comment   Glucose-Capillary 152 (*) 70 - 99 (mg/dL)    Comment 1  Documented in Chart      Comment 2 Notify RN       Dg Knee 1-2 Views Right  04/02/2011  *RADIOLOGY REPORT*  Clinical Data: Post reduction right knee dislocation.  PORTABLE RIGHT KNEE - 1-2 VIEW 04/12/2011 1927 hours:  Comparison: Right knee x-rays earlier same date 1834 hours.  Findings: Anatomic alignment of the right knee post reduction. Small bone fragment adjacent to the lateral femoral condyle.  No evidence of fracture elsewhere.  Joint spaces well preserved for age.  IMPRESSION: Anatomic alignment post reduction.  Probable avulsion fracture involving the lateral femoral condyle at or near the insertion of the lateral collateral ligament.  Original Report Authenticated By: Arnell Sieving, M.D.   Dg Knee 1-2 Views Right  04/02/2011  *RADIOLOGY REPORT*  Clinical Data: Injury, pain.  RIGHT KNEE - 1-2 VIEW  Comparison: None.  Findings: Positioning is nonstandard.  There appears to be anterior dislocation of the right knee.  No fracture is identified.  IMPRESSION: Anterior dislocation right knee.  No fracture.  Original Report Authenticated By: Bernadene Bell. Maricela Curet, M.D.   Dg Tibia/fibula Right  04/02/2011  *RADIOLOGY REPORT*  Clinical Data: Fall, pain.  RIGHT TIBIA AND FIBULA - 2 VIEW  Comparison: None.  Findings: No acute bony or joint abnormality is identified. Vascular clips in the medial aspect of the thigh noted.  IMPRESSION: No acute finding.  Original Report Authenticated By: Bernadene Bell. Maricela Curet, M.D.    Review of Systems  Musculoskeletal: Positive for joint pain.   Blood pressure 113/80, pulse 65, temperature 98.6 F (37 C), temperature source Oral, resp. rate 18, height 5\' 11"  (1.803 m), weight 89.223 kg (196 lb 11.2 oz), SpO2 92.00%. Physical Exam  Musculoskeletal:       Right knee: He exhibits decreased range of motion, swelling, effusion and ecchymosis.  Compartments are all soft in right leg.  He has a normal pulse when comparing the right and left legs/feet.  Clinically in  good alignment.  He moves his ankle and toes easily.  Neg. Homan's  Assessment/Plan: Post closed reduction of a right knee dislocation 1)  His compartments are soft and he has excellent pulses in his foot.  Doing well overall.  He will need a hinged knee brace and PT for mobility with WBAT right leg in brace.  Sidonia Nutter Y 04/03/2011, 1:11 PM

## 2011-04-03 NOTE — ED Notes (Signed)
[facility] here for transport.  

## 2011-04-03 NOTE — Progress Notes (Signed)
Subjective: No complaints other than right knee pain that is fairly well-controlled with pain medication.  Objective: Vital signs in last 24 hours: Temp:  [97.2 F (36.2 C)-99.8 F (37.7 C)] 98.6 F (37 C) (12/08 0545) Pulse Rate:  [64-91] 65  (12/08 0545) Resp:  [16-18] 18  (12/08 0545) BP: (113-171)/(49-82) 113/80 mmHg (12/08 0545) SpO2:  [92 %-100 %] 92 % (12/08 0545) Weight:  [88.451 kg (195 lb)-89.223 kg (196 lb 11.2 oz)] 196 lb 11.2 oz (89.223 kg) (12/08 0130) Weight change:  Last BM Date: 04/01/11  Intake/Output from previous day: 12/07 0701 - 12/08 0700 In: 374.1 [I.V.:374.1] Out: 625 [Urine:625] Total I/O In: 120 [P.O.:120] Out: 400 [Urine:400]   Physical Exam: General: Alert, awake, oriented x3, in no acute distress. HEENT: No bruits, no goiter. Heart: Regular rate and rhythm, without murmurs, rubs, gallops. Lungs: Clear to auscultation bilaterally. Abdomen: Soft, nontender, nondistended, positive bowel sounds. Extremities: No clubbing cyanosis or edema with positive pedal pulses on the left, on the right he has a knee brace that I have not removed however he does not have any edema below the brace and he has good right lower tremor the pulses.     Lab Results: Basic Metabolic Panel:  Basename 04/03/11 0152 04/02/11 1915  NA 138 140  K 4.1 4.3  CL 100 99  CO2 27 29  GLUCOSE 100* 113*  BUN 22 21  CREATININE 0.90 1.01  CALCIUM 8.9 9.7  MG -- --  PHOS -- --   CBC:  Basename 04/03/11 0152 04/02/11 1915  WBC 11.9* 19.8*  NEUTROABS -- 17.0*  HGB 12.4* 13.4  HCT 37.5* 40.7  MCV 91.2 93.6  PLT 223 246   Cardiac Enzymes:  Basename 04/03/11 0152  CKTOTAL 1225*  CKMB --  CKMBINDEX --  TROPONINI --   CBG:  Basename 04/03/11 1147 04/03/11 0758  GLUCAP 152* 111*   Coagulation:  Basename 04/02/11 1915  LABPROT 14.8  INR 1.14    Studies/Results: Dg Knee 1-2 Views Right  04/02/2011  *RADIOLOGY REPORT*  Clinical Data: Post reduction right  knee dislocation.  PORTABLE RIGHT KNEE - 1-2 VIEW 04/12/2011 1927 hours:  Comparison: Right knee x-rays earlier same date 1834 hours.  Findings: Anatomic alignment of the right knee post reduction. Small bone fragment adjacent to the lateral femoral condyle.  No evidence of fracture elsewhere.  Joint spaces well preserved for age.  IMPRESSION: Anatomic alignment post reduction.  Probable avulsion fracture involving the lateral femoral condyle at or near the insertion of the lateral collateral ligament.  Original Report Authenticated By: Arnell Sieving, M.D.   Dg Knee 1-2 Views Right  04/02/2011  *RADIOLOGY REPORT*  Clinical Data: Injury, pain.  RIGHT KNEE - 1-2 VIEW  Comparison: None.  Findings: Positioning is nonstandard.  There appears to be anterior dislocation of the right knee.  No fracture is identified.  IMPRESSION: Anterior dislocation right knee.  No fracture.  Original Report Authenticated By: Bernadene Bell. Maricela Curet, M.D.   Dg Tibia/fibula Right  04/02/2011  *RADIOLOGY REPORT*  Clinical Data: Fall, pain.  RIGHT TIBIA AND FIBULA - 2 VIEW  Comparison: None.  Findings: No acute bony or joint abnormality is identified. Vascular clips in the medial aspect of the thigh noted.  IMPRESSION: No acute finding.  Original Report Authenticated By: Bernadene Bell. Maricela Curet, M.D.    Medications: Scheduled Meds:   . sodium chloride   Intravenous Once  . benazepril  20 mg Oral Daily  . digoxin  250 mcg Oral  Daily  . furosemide  20 mg Oral Daily  . glimepiride  4 mg Oral QAC breakfast  . HYDROmorphone      . ketorolac  15 mg Intravenous Once  . metoprolol tartrate  25 mg Oral BID  . morphine      . simvastatin  40 mg Oral q1800  . DISCONTD:  HYDROmorphone (DILAUDID) injection  1 mg Intravenous Once   Continuous Infusions:   . sodium chloride 75 mL/hr at 04/03/11 0810   PRN Meds:.diazepam, morphine, ondansetron (ZOFRAN) IV, ondansetron  Assessment/Plan:  Principal Problem:  *Knee  dislocation   #1 right knee dislocation: This occurred after a bout at 1200 pound cement board fell on his leg. He initially presented to [facility] emergency department where Dr. Romeo Apple performed a closed reduction of his knee dislocation. For reasons that are unbeknownst to me, patient was somehow transferred to [facility] cone under the medical service for evaluation of potential compartment syndrome by recommendation of Dr. Romeo Apple. Patient is not showing any signs of compartment syndrome, he is not developing any edema or numbness of his right leg and he has equal pulses on the right compared to the left. I have asked Dr. Allie Bossier with orthopedics to assist me. He has already seen the patient and is recommending a hinged knee brace and PT for mobility with weightbearing as tolerated. Will await PT recommendations for potential discharge.    LOS: 1 day   Chaya Jan Pager: [phone] 04/03/2011, 1:29 PM

## 2011-04-03 NOTE — Plan of Care (Signed)
Problem: Consults Goal: Diabetes Guidelines if Diabetic/Glucose > 140 If diabetic or lab glucose is > 140 mg/dl - Initiate Diabetes/Hyperglycemia Guidelines & Document Interventions  Outcome: Progressing CBG ACHS checks ordered-no insulin at this time (Amaryl ordered)     Problem: Phase I Progression Outcomes Goal: OOB as tolerated unless otherwise ordered Outcome: Not Applicable Date Met:  04/03/11 Pt on bedrest

## 2011-04-04 LAB — GLUCOSE, CAPILLARY: Glucose-Capillary: 136 mg/dL — ABNORMAL HIGH (ref 70–99)

## 2011-04-04 LAB — CK: Total CK: 872 U/L — ABNORMAL HIGH (ref 7–232)

## 2011-04-04 MED ORDER — COUMADIN BOOK
Freq: Once | Status: AC
  Administered 2011-04-04: 19:00:00
  Filled 2011-04-04: qty 1

## 2011-04-04 MED ORDER — WARFARIN VIDEO: Freq: Once | Status: DC

## 2011-04-04 MED ORDER — WARFARIN SODIUM 5 MG PO TABS
5.0000 mg | ORAL_TABLET | Freq: Once | ORAL | Status: AC
  Administered 2011-04-04: 5 mg via ORAL
  Filled 2011-04-04: qty 1

## 2011-04-04 NOTE — Progress Notes (Signed)
ANTICOAGULATION CONSULT NOTE - Initial Consult  Pharmacy Consult for warfarin Indication: VTE prophylaxis  Allergies  Allergen Reactions  . Sulfonamide Derivatives   . Tetracycline     Patient Measurements: Height: 5\' 11"  (180.3 cm) Weight: 196 lb 11.2 oz (89.223 kg) IBW/kg (Calculated) : 75.3   Vital Signs: Temp: 98.9 F (37.2 C) (12/09 1333) Temp src: Oral (12/09 1333) BP: 125/40 mmHg (12/09 1333) Pulse Rate: 78  (12/09 1333)  Labs:  Basename 04/04/11 0151 04/03/11 1516 04/03/11 0152 04/02/11 1915  HGB -- -- 12.4* 13.4  HCT -- -- 37.5* 40.7  PLT -- -- 223 246  APTT -- -- -- 31  LABPROT -- -- -- 14.8  INR -- -- -- 1.14  HEPARINUNFRC -- -- -- --  CREATININE -- -- 0.90 1.01  CKTOTAL 872* 1081* 1225* --  CKMB -- -- -- --  TROPONINI -- -- -- --   Estimated Creatinine Clearance: 67.4 ml/min (by C-G formula based on Cr of 0.9).  Medical History: Past Medical History  Diagnosis Date  . Pneumonia     left lower lobe  . Hx of CABG   . Asbestos exposure   . Dyslipidemia   . Nephrolithiasis   . PVD (peripheral vascular disease)   . Diabetes mellitus   . Renal cyst   . Colitis   . Hypertension   . CAD (coronary artery disease) 2003    last catheterization 2003, potential ischemia in the ramus, grafts patent  ( last nuclear 2002)  . TIA (transient ischemic attack) 2007    ?? medical Rx   . Dizziness     He has had falls related to this / Chronic dizziness  . Bradycardia     MILD  . Carotid artery disease 2001    BILATERAL 0-39%/  Follow with Dopplers by Dr. Sherril Croon  . SOB (shortness of breath)   . Ejection fraction     50%, echo, January, 2012, no mention of significant diastolic dysfunction  . Shortness of breath     May, 2012  . Aortic insufficiency     Mild, echo, January, 2012    Medications:  Prescriptions prior to admission  Medication Sig Dispense Refill  . aspirin (ASPIR-TRIN) 325 MG EC tablet Take 325 mg by mouth daily.        . benazepril  (LOTENSIN) 20 MG tablet Take 20 mg by mouth daily.        . digoxin (LANOXIN) 0.25 MG tablet Take 250 mcg by mouth daily.        . furosemide (LASIX) 20 MG tablet Take 20 mg by mouth daily as needed. For fluid       . glimepiride (AMARYL) 4 MG tablet Take 4 mg by mouth daily before breakfast.        . metFORMIN (GLUCOPHAGE) 500 MG tablet Take 1,000 mg by mouth 2 (two) times daily with a meal.        . metoprolol tartrate (LOPRESSOR) 25 MG tablet Take 25 mg by mouth 2 (two) times daily.        . Multiple Vitamin (MULTIVITAMIN) tablet Take 1 tablet by mouth daily.        . Omega-3 Fatty Acids (FISH OIL) 1200 MG CAPS Take 1 capsule by mouth daily.        . simvastatin (ZOCOR) 40 MG tablet TAKE ONE TABLET BY MOUTH AT BEDTIME.  30 tablet  6  . diazepam (VALIUM) 5 MG tablet Take 5 mg by mouth at bedtime as  needed. For nerves       . nitroGLYCERIN (NITROSTAT) 0.4 MG SL tablet Place 1 tablet (0.4 mg total) under the tongue every 5 (five) minutes as needed.  25 tablet  3    Assessment: [name] is an 77 year old gentleman who unfortunately had a board fall on his right leg, dislocating his knee. Per Dr. Magnus Ivan, he is to require 4-6 weeks of anticoagulation for VTE prophylaxis d/t knee immobility. Baseline INR 1.14, CBC 12.4/37.5/223. No contraindications to warfarin at this juncture. Goal of Therapy:  INR 2-3   Plan:  1. Coumadin 5 mg PO x1 tonight 2. Daily PT/INR 3. Coumadin video/book  [name], [name] 04/04/2011,3:09 PM

## 2011-04-04 NOTE — Progress Notes (Signed)
Patient ID: [name], male   DOB: 09/04/1926, 77 y.o.   MRN: [id number] Slow mobility with PT.  Considerable pain with ambulation.  Denies numbness in right foot. AF/VSS Right leg with soft compartments. Foot well perfused with palpable pulse in foot. Nml sensation. Knee brace fitting nicely.  Rec: Will need HHPT likely. Will go with what PT recommends. Need DVT prophylaxis for at least next 4-6 weeks with coumadin.

## 2011-04-04 NOTE — Progress Notes (Signed)
Physical Therapy Evaluation Patient Details Name: [name] MRN: [id number] DOB: February 15, 1927 Today's Date: 04/04/2011  Problem List:  Patient Active Problem List  Diagnoses  . DIABETES MELLITUS  . COLITIS, HX OF  . NEPHROLITHIASIS, HX OF  . ASBESTOS EXPOSURE, HX OF  . Hx of CABG  . Dyslipidemia  . Hypertension  . CAD (coronary artery disease)  . TIA (transient ischemic attack)  . Bradycardia  . Shortness of breath  . Dizziness  . Carotid artery disease  . Ejection fraction  . Knee dislocation    Past Medical History:  Past Medical History  Diagnosis Date  . Pneumonia     left lower lobe  . Hx of CABG   . Asbestos exposure   . Dyslipidemia   . Nephrolithiasis   . PVD (peripheral vascular disease)   . Diabetes mellitus   . Renal cyst   . Colitis   . Hypertension   . CAD (coronary artery disease) 2003    last catheterization 2003, potential ischemia in the ramus, grafts patent  ( last nuclear 2002)  . TIA (transient ischemic attack) 2007    ?? medical Rx   . Dizziness     He has had falls related to this / Chronic dizziness  . Bradycardia     MILD  . Carotid artery disease 2001    BILATERAL 0-39%/  Follow with Dopplers by Dr. Sherril Croon  . SOB (shortness of breath)   . Ejection fraction     50%, echo, January, 2012, no mention of significant diastolic dysfunction  . Shortness of breath     May, 2012  . Aortic insufficiency     Mild, echo, January, 2012   Past Surgical History:  Past Surgical History  Procedure Date  . Coronary artery bypass graft   . Percutaneous transluminal coronary angioplasty     hx    PT Assessment/Plan/Recommendation PT Assessment Clinical Impression Statement: Patient is an 77 yo male admitted with dislocated right knee, s/p closed reduction and Bledsoe brace.  PTA patient was independent and caregiver for spouse.  Patient requiring mod assist for mobility.  Family not able to provide this level of assist 24/7.  Patient needs to  function at modified independent level for family to provide care for him and his wife.  Recommend Inpatient Rehab consult for intense therapy with goal for patient to return home. PT Recommendation/Assessment: Patient will need skilled PT in the acute care venue PT Problem List: Decreased strength;Decreased range of motion;Decreased activity tolerance;Decreased mobility;Decreased balance;Decreased knowledge of use of DME;Pain Barriers to Discharge: Decreased caregiver support (Family can provide supervision level of assist) PT Therapy Diagnosis : Difficulty walking;Acute pain;Generalized weakness PT Plan PT Frequency: Min 6X/week PT Treatment/Interventions: DME instruction;Gait training;Functional mobility training;Balance training;Patient/family education PT Recommendation Recommendations for Other Services: Rehab consult Follow Up Recommendations: Inpatient Rehab Equipment Recommended: Defer to next venue PT Goals  Acute Rehab PT Goals PT Goal Formulation: With patient/family Time For Goal Achievement: 2 weeks Pt will go Supine/Side to Sit: with HOB not 0 degrees (comment degree);Independently PT Goal: Supine/Side to Sit - Progress: Not met Pt will go Sit to Supine/Side: with HOB not 0 degrees (comment degree);Independently PT Goal: Sit to Supine/Side - Progress: Not met Pt will go Sit to Stand: with modified independence PT Goal: Sit to Stand - Progress: Not met Pt will go Stand to Sit: with modified independence PT Goal: Stand to Sit - Progress: Not met Pt will Transfer Bed to Chair/Chair to Bed: with  modified independence PT Transfer Goal: Bed to Chair/Chair to Bed - Progress: Not met Pt will Ambulate: 51 - 150 feet;with modified independence;with rolling walker PT Goal: Ambulate - Progress: Not met  PT Evaluation Precautions/Restrictions  Precautions Precautions: Fall Required Braces or Orthoses: Yes Other Brace/Splint: Bledsoe knee brace right LE at all  times Restrictions Weight Bearing Restrictions: Yes RLE Weight Bearing: Weight bearing as tolerated Other Position/Activity Restrictions: Bledsoe brace right LE at all times Prior Functioning  Home Living Lives With: Spouse (Pt cares for wife) Dolores Lory Help From: Family (All family members work.  Not able to provide 24 hour assist) Type of Home: House Home Layout: Able to live on main level with bedroom/bathroom;Two level Home Access: Ramped entrance Bathroom Shower/Tub: Walk-in shower Bathroom Toilet: Handicapped height Home Adaptive Equipment: Bedside commode/3-in-1;Built-in shower seat;Grab bars around toilet;Grab bars in shower;Hand-held shower hose;Shower chair with back;Walker - rolling;Walker - four wheeled;Wheelchair - manual [employer] raises and lowers) Prior Function Level of Independence: Independent with basic ADLs;Independent with homemaking with ambulation;Independent with gait Driving: Yes Cognition Cognition Arousal/Alertness: Awake/alert Overall Cognitive Status: Appears within functional limits for tasks assessed Sensation/Coordination Sensation Light Touch: Appears Intact Extremity Assessment RUE Assessment RUE Assessment: Within Functional Limits LUE Assessment LUE Assessment: Within Functional Limits RLE Assessment RLE Assessment: Exceptions to [facility] (Bledsoe locked in extension.  Hip strength 3-/5) LLE Assessment LLE Assessment: Within Functional Limits Mobility (including Balance) Bed Mobility Bed Mobility: Yes Supine to Sit: 3: Mod assist;With rails;HOB elevated (Comment degrees) (HOB at 40 degrees) Supine to Sit Details (indicate cue type and reason): Physical assist to move right LE off bed. Cues for technique Sitting - Scoot to Edge of Bed: 3: Mod assist;With rail Sitting - Scoot to [place] of Bed Details (indicate cue type and reason): Cues to try to scoot one hip forward at a time.  Physical assist to support right LE. Transfers Transfers: Yes Sit to  Stand: 3: Mod assist;With upper extremity assist;From bed Sit to Stand Details (indicate cue type and reason): Cues for correct safe hand placement. Stand to Sit: 3: Mod assist;With upper extremity assist;With armrests;To chair/3-in-1 Stand to Sit Details: Cues for placement of RLE and hands to sit.  Assist to slowly lower to chair Ambulation/Gait Ambulation/Gait: Yes Ambulation/Gait Assistance: 4: Min assist Ambulation/Gait Assistance Details (indicate cue type and reason): Cues for safe use of RW and for gait sequence.  Cues to stand upright. Ambulation Distance (Feet): 10 Feet Assistive device: Rolling walker Gait Pattern: Step-to pattern;Decreased step length - left;Decreased stance time - right;Antalgic;Trunk flexed Gait velocity: Slow gait speed Stairs: No    Exercise    End of Session PT - End of Session Equipment Utilized During Treatment: Gait belt;Other (comment) (Bledsoe brace) Activity Tolerance: Patient limited by pain;Patient limited by fatigue Patient left: in chair;with call bell in reach;with family/visitor present Nurse Communication: Mobility status for transfers;Weight bearing status General Behavior During Session: [facility] for tasks performed Cognition: [facility] for tasks performed  Vena Austria [phone] 04/04/2011, 11:18 AM

## 2011-04-05 DIAGNOSIS — S83106A Unspecified dislocation of unspecified knee, initial encounter: Secondary | ICD-10-CM

## 2011-04-05 DIAGNOSIS — W208XXA Other cause of strike by thrown, projected or falling object, initial encounter: Secondary | ICD-10-CM

## 2011-04-05 LAB — GLUCOSE, CAPILLARY: Glucose-Capillary: 135 mg/dL — ABNORMAL HIGH (ref 70–99)

## 2011-04-05 LAB — PROTIME-INR: INR: 1.12 (ref 0.00–1.49)

## 2011-04-05 LAB — CK: Total CK: 367 U/L — ABNORMAL HIGH (ref 7–232)

## 2011-04-05 MED ORDER — DIPHENHYDRAMINE-ZINC ACETATE 2-0.1 % EX CREA
TOPICAL_CREAM | Freq: Two times a day (BID) | CUTANEOUS | Status: DC | PRN
  Filled 2011-04-05: qty 28.4

## 2011-04-05 MED ORDER — HYDROCODONE-ACETAMINOPHEN 5-325 MG PO TABS: 1.0000 | ORAL_TABLET | Freq: Four times a day (QID) | ORAL | Status: AC | PRN

## 2011-04-05 MED ORDER — WARFARIN SODIUM 5 MG PO TABS
5.0000 mg | ORAL_TABLET | Freq: Once | ORAL | Status: DC
  Filled 2011-04-05: qty 1

## 2011-04-05 MED ORDER — WARFARIN SODIUM 5 MG PO TABS: 5.0000 mg | ORAL_TABLET | Freq: Every day | ORAL | Status: DC

## 2011-04-05 NOTE — Progress Notes (Signed)
Patient ID: [name], male   DOB: 1927/01/14, 77 y.o.   MRN: [id number] Has ambulated better today.  Some decreased pain. Right knee swollen. Calf soft. No evidence of compartment syndrome. Foot warm, palpable pulse, normal sensation  Plan: Continue to increase mobility Coumadin for 4-6 weeks. Close Follow-up in one week in my office at [facility] [phone]

## 2011-04-05 NOTE — Progress Notes (Signed)
Physical Therapy Treatment Patient Details Name: [name] MRN: [id number] DOB: 07-Feb-1927 Today's Date: 04/05/2011  PT Assessment/Plan  PT - Assessment/Plan Comments on Treatment Session: Pt. motivated to increase ambulation distance today, reporting pain in the right knee rated at 1/10 at rest up to 2 or 3/10 with ambulation.  Pt. still mod assist for sit to stand, adn requiring increased time for bed mobility.  Discussed importance of 24 hr supervision at home due to mobility limitations, to which pt. stated he would make sure there would be someone there, as he is caregiver for his wife as well.  Educated pt. about which type of walker to use at home and on use of leg lifter to increase independence with transfering in and out of the bed.  PT Plan: Discharge plan needs to be updated Follow Up Recommendations: Home health PT;24 hour supervision/assistance Equipment Recommended: Other (comment) (Elevated leg rest for wheelchair) PT Goals  Acute Rehab PT Goals PT Goal: Supine/Side to Sit - Progress: Progressing toward goal PT Goal: Sit to Supine/Side - Progress: Progressing toward goal PT Goal: Sit to Stand - Progress: Progressing toward goal PT Goal: Stand to Sit - Progress: Progressing toward goal PT Transfer Goal: Bed to Chair/Chair to Bed - Progress: Progressing toward goal PT Goal: Ambulate - Progress: Progressing toward goal  PT Treatment Precautions/Restrictions  Precautions Precautions: Fall Required Braces or Orthoses: Yes Other Brace/Splint: Bledsoe knee brace right LE at all times Restrictions Weight Bearing Restrictions: No RLE Weight Bearing: Weight bearing as tolerated Other Position/Activity Restrictions: Bledsoe brace right LE at all times Mobility (including Balance) Bed Mobility Bed Mobility: Yes Supine to Sit: 6: Modified independent (Device/Increase time);HOB flat Supine to Sit Details (indicate cue type and reason): Pt. requiring use of headboard,  mattress and increased time.  Sitting - Scoot to Edge of Bed: 5: Supervision Sitting - Scoot to [place] of Bed Details (indicate cue type and reason): Cues to scoot all the way forward to get LLE underneath him in preparation for standing.  Sit to Supine - Right: 6: Modified independent (Device/Increase time);HOB flat (Use of leg lifter (blanket roll) and increased time. ) Transfers Transfers: Yes Sit to Stand: 3: Mod assist;From elevated surface;From bed;From chair/3-in-1 Sit to Stand Details (indicate cue type and reason): Cues for hand placement, assist for anterior translation.  Stand to Sit: 4: Min assist;With armrests;To bed;To chair/3-in-1 Stand to Sit Details: Cues for hand placement and to bring RLE forward before sitting.  Pt. does well lowering himself down but tends to plop at the end.  Ambulation/Gait Ambulation/Gait: Yes Ambulation/Gait Assistance: 4: Min assist Ambulation/Gait Assistance Details (indicate cue type and reason): Cues for upright posture and for safe use of RW.  Also encouraged pt. to attempt to bring right heel to the ground.  Ambulation Distance (Feet): 35 Feet Assistive device: Rolling walker Gait Pattern: Step-to pattern;Decreased step length - left;Decreased stance time - right;Antalgic Stairs: No    Exercise    End of Session PT - End of Session Equipment Utilized During Treatment: Gait belt;Other (comment) (Bledsoe brace) Activity Tolerance: Patient tolerated treatment well Patient left: in chair;with call bell in reach Nurse Communication: Mobility status for transfers General Behavior During Session: [facility] for tasks performed Cognition: [facility] for tasks performed  Laney Pastor, SPT  04/05/2011, 11:54 AM

## 2011-04-05 NOTE — Progress Notes (Signed)
Spoke with patient's daughter she states patient does need a manual w/chair, referral made to [facility], will bring up to patient's room.  MD will need to write script for elevated leg rest for right side.

## 2011-04-05 NOTE — Discharge Summary (Signed)
Physician Discharge Summary  Patient ID: [name] MRN: [id number] DOB/AGE: Nov 13, 1926 77 y.o.  Admit date: 04/02/2011 Discharge date: 04/05/2011  Primary Care Physician:  Ignatius Specking., MD, MD   Discharge Diagnoses:    Principal Problem:  *Knee dislocation    Current Discharge Medication List    START taking these medications   Details  HYDROcodone-acetaminophen (NORCO) 5-325 MG per tablet Take 1-2 tablets by mouth every 6 (six) hours as needed. Qty: 30 tablet, Refills: 0    warfarin (COUMADIN) 5 MG tablet Take 1 tablet (5 mg total) by mouth daily. Take 1 tablet daily until seen by Dr. Sherril Croon on Wednesday. Qty: 15 tablet, Refills: 0      CONTINUE these medications which have NOT CHANGED   Details  aspirin (ASPIR-TRIN) 325 MG EC tablet Take 325 mg by mouth daily.      benazepril (LOTENSIN) 20 MG tablet Take 20 mg by mouth daily.      digoxin (LANOXIN) 0.25 MG tablet Take 250 mcg by mouth daily.      furosemide (LASIX) 20 MG tablet Take 20 mg by mouth daily as needed. For fluid     glimepiride (AMARYL) 4 MG tablet Take 4 mg by mouth daily before breakfast.      metFORMIN (GLUCOPHAGE) 500 MG tablet Take 1,000 mg by mouth 2 (two) times daily with a meal.      metoprolol tartrate (LOPRESSOR) 25 MG tablet Take 25 mg by mouth 2 (two) times daily.      Multiple Vitamin (MULTIVITAMIN) tablet Take 1 tablet by mouth daily.      Omega-3 Fatty Acids (FISH OIL) 1200 MG CAPS Take 1 capsule by mouth daily.      simvastatin (ZOCOR) 40 MG tablet TAKE ONE TABLET BY MOUTH AT BEDTIME. Qty: 30 tablet, Refills: 6    diazepam (VALIUM) 5 MG tablet Take 5 mg by mouth at bedtime as needed. For nerves     nitroGLYCERIN (NITROSTAT) 0.4 MG SL tablet Place 1 tablet (0.4 mg total) under the tongue every 5 (five) minutes as needed. Qty: 25 tablet, Refills: 3         Disposition and Follow-up:  Patient to be discharged home today in stable and improved condition. We will have a home  health nurse see him in 2 days for an INR check and will be called into his primary care physician. We'll also arrange for home health PT and OT.  Consults:  orthopedic surgery Dr. Magnus Ivan   Significant Diagnostic Studies:  Dg Knee 1-2 Views Right  04/02/2011  *RADIOLOGY REPORT*  Clinical Data: Post reduction right knee dislocation.  PORTABLE RIGHT KNEE - 1-2 VIEW 04/12/2011 1927 hours:  Comparison: Right knee x-rays earlier same date 1834 hours.  Findings: Anatomic alignment of the right knee post reduction. Small bone fragment adjacent to the lateral femoral condyle.  No evidence of fracture elsewhere.  Joint spaces well preserved for age.  IMPRESSION: Anatomic alignment post reduction.  Probable avulsion fracture involving the lateral femoral condyle at or near the insertion of the lateral collateral ligament.  Original Report Authenticated By: Arnell Sieving, M.D.   Dg Knee 1-2 Views Right  04/02/2011  *RADIOLOGY REPORT*  Clinical Data: Injury, pain.  RIGHT KNEE - 1-2 VIEW  Comparison: None.  Findings: Positioning is nonstandard.  There appears to be anterior dislocation of the right knee.  No fracture is identified.  IMPRESSION: Anterior dislocation right knee.  No fracture.  Original Report Authenticated By: Bernadene Bell. Maricela Curet, M.D.  Dg Tibia/fibula Right  04/02/2011  *RADIOLOGY REPORT*  Clinical Data: Fall, pain.  RIGHT TIBIA AND FIBULA - 2 VIEW  Comparison: None.  Findings: No acute bony or joint abnormality is identified. Vascular clips in the medial aspect of the thigh noted.  IMPRESSION: No acute finding.  Original Report Authenticated By: Bernadene Bell. D'ALESSIO, M.D.    Brief H and P: For complete details please refer to admission H and P, but in brief patient is a very pleasant 77 year old gentleman who was doing some renovation at his house and a 1200 pound cement board fell on his right knee causing him to fall to the ground and experienced immediate pain. He went to [facility]  emergency department where he was found to have a dislocated right knee. Dr. Romeo Apple, orthopedist in [place], reset his knee but then referred him for an admission [facility] for observation for potential compartment syndrome.    Hospital Course:  Principal Problem:  *Knee dislocation   #1 right knee dislocation: This has been reset. He was evaluated by physical therapy who recommended inpatient rehabilitation versus skilled PT, however patient refuses saying that he is going home. We have set up for home health PT. Dr. Magnus Ivan has recommended 6 weeks of Coumadin for DVT prophylaxis given immobilization of his right leg. Home health nurse to come out to the house in 2 days for INR check which will be relayed to his primary care physician. He has been fitted with a Bledsoe brace  Time spent on Discharge: Greater than 30 minutes.  SignedChaya Jan [facility] Pager: [phone] 04/05/2011, 3:06 PM

## 2011-04-05 NOTE — Progress Notes (Signed)
ANTICOAGULATION CONSULT NOTE - Follow Up  Pharmacy Consult for warfarin Indication: VTE prophylaxis  Allergies  Allergen Reactions  . Sulfonamide Derivatives   . Tetracycline     Patient Measurements: Height: 5\' 11"  (180.3 cm) Weight: 196 lb 11.2 oz (89.223 kg) IBW/kg (Calculated) : 75.3   Vital Signs: Temp: 99.7 F (37.6 C) (12/10 0512) Temp src: Oral (12/10 0512) BP: 146/78 mmHg (12/10 0512) Pulse Rate: 85  (12/10 0512)  Labs:  Basename 04/05/11 0723 04/04/11 2215 04/04/11 0151 04/03/11 0152 04/02/11 1915  HGB -- -- -- 12.4* 13.4  HCT -- -- -- 37.5* 40.7  PLT -- -- -- 223 246  APTT -- -- -- -- 31  LABPROT 14.6 -- -- -- 14.8  INR 1.12 -- -- -- 1.14  HEPARINUNFRC -- -- -- -- --  CREATININE -- -- -- 0.90 1.01  CKTOTAL 367* 474* 872* -- --  CKMB -- -- -- -- --  TROPONINI -- -- -- -- --   Estimated Creatinine Clearance: 67.4 ml/min (by C-G formula based on Cr of 0.9).  Medical History: Past Medical History  Diagnosis Date  . Pneumonia     left lower lobe  . Hx of CABG   . Asbestos exposure   . Dyslipidemia   . Nephrolithiasis   . PVD (peripheral vascular disease)   . Diabetes mellitus   . Renal cyst   . Colitis   . Hypertension   . CAD (coronary artery disease) 2003    last catheterization 2003, potential ischemia in the ramus, grafts patent  ( last nuclear 2002)  . TIA (transient ischemic attack) 2007    ?? medical Rx   . Dizziness     He has had falls related to this / Chronic dizziness  . Bradycardia     MILD  . Carotid artery disease 2001    BILATERAL 0-39%/  Follow with Dopplers by Dr. Sherril Croon  . SOB (shortness of breath)   . Ejection fraction     50%, echo, January, 2012, no mention of significant diastolic dysfunction  . Shortness of breath     May, 2012  . Aortic insufficiency     Mild, echo, January, 2012    Medications:  Prescriptions prior to admission  Medication Sig Dispense Refill  . aspirin (ASPIR-TRIN) 325 MG EC tablet Take 325 mg  by mouth daily.        . benazepril (LOTENSIN) 20 MG tablet Take 20 mg by mouth daily.        . digoxin (LANOXIN) 0.25 MG tablet Take 250 mcg by mouth daily.        . furosemide (LASIX) 20 MG tablet Take 20 mg by mouth daily as needed. For fluid       . glimepiride (AMARYL) 4 MG tablet Take 4 mg by mouth daily before breakfast.        . metFORMIN (GLUCOPHAGE) 500 MG tablet Take 1,000 mg by mouth 2 (two) times daily with a meal.        . metoprolol tartrate (LOPRESSOR) 25 MG tablet Take 25 mg by mouth 2 (two) times daily.        . Multiple Vitamin (MULTIVITAMIN) tablet Take 1 tablet by mouth daily.        . Omega-3 Fatty Acids (FISH OIL) 1200 MG CAPS Take 1 capsule by mouth daily.        . simvastatin (ZOCOR) 40 MG tablet TAKE ONE TABLET BY MOUTH AT BEDTIME.  30 tablet  6  .  diazepam (VALIUM) 5 MG tablet Take 5 mg by mouth at bedtime as needed. For nerves       . nitroGLYCERIN (NITROSTAT) 0.4 MG SL tablet Place 1 tablet (0.4 mg total) under the tongue every 5 (five) minutes as needed.  25 tablet  3    Assessment: 77 yo male admitted with dislocated knee. Plan 4-6 weeks of Coumadin for VTE prophylaxis. INR is below-goal.    Goal of Therapy:  INR 2-3   Plan:  1. Coumadin 5 mg PO x1 tonight 2. Daily PT/INR  Emeline Gins 04/05/2011,9:35 AM

## 2011-04-05 NOTE — Progress Notes (Signed)
Seen and agree with SPT note. Stressed importance of 24 hour assist at home to pt which he states he will have arranged between the 4 children but not confirmed which ones at what time yet. Pt also educated that leg lifter in gift shop will make bed mobility easier.  Toney Sang, PT [phone]

## 2011-04-05 NOTE — Consult Note (Signed)
Physical Medicine and Rehabilitation Consult Reason for Consult:Right knee dislocation Referring Phsyician: Triad MARKEITH JUE is an 77 y.o. male.   HPI: 77 year old right-handed white male admitted independent hospital December 8 while working at home when they 1200 pound object fell across to his right knee with acute knee dislocation. Was seen at [facility] and underwent closed reduction per Dr. Romeo Apple of orthopedic services. He was transferred to [facility] with followup per Dr. Magnus Ivan orthopedic services to rule out compartment syndrome with all being negative. No surgical intervention was required and he was fitted with a Bledsoe knee brace and advised weightbearing as tolerated to the right lower extremity. Placed on Coumadin for deep vein thrombosis prophylaxis. Physical therapy evaluation completed on December 9 requiring minimal assistance to ambulate 10 feet with a rolling walker and moderate assistance for sit to stand. Physical medicine and rehabilitation were consulted at the request of physical therapy to consider inpatient rehabilitation services.  Review of Systems  Constitutional: Negative for fever.  Eyes: Negative for blurred vision.  Respiratory: Negative for cough and shortness of breath.   Cardiovascular: Negative for chest pain.  Gastrointestinal: Positive for constipation. Negative for nausea.  Genitourinary: Negative for dysuria.  Musculoskeletal: Positive for joint pain.  Skin: Negative.   Neurological: Negative for dizziness and headaches.  Psychiatric/Behavioral: Negative.    Past Medical History  Diagnosis Date  . Pneumonia     left lower lobe  . Hx of CABG   . Asbestos exposure   . Dyslipidemia   . Nephrolithiasis   . PVD (peripheral vascular disease)   . Diabetes mellitus   . Renal cyst   . Colitis   . Hypertension   . CAD (coronary artery disease) 2003    last catheterization 2003, potential ischemia in the ramus, grafts  patent  ( last nuclear 2002)  . TIA (transient ischemic attack) 2007    ?? medical Rx   . Dizziness     He has had falls related to this / Chronic dizziness  . Bradycardia     MILD  . Carotid artery disease 2001    BILATERAL 0-39%/  Follow with Dopplers by Dr. Sherril Croon  . SOB (shortness of breath)   . Ejection fraction     50%, echo, January, 2012, no mention of significant diastolic dysfunction  . Shortness of breath     May, 2012  . Aortic insufficiency     Mild, echo, January, 2012   Past Surgical History  Procedure Date  . Coronary artery bypass graft   . Percutaneous transluminal coronary angioplasty     hx   Family History  Problem Relation Age of Onset  . Heart attack Mother 27   Social History:  reports that he quit smoking about 30 years ago. His smoking use included Cigarettes. He has a 60 pack-year smoking history. He has never used smokeless tobacco. His alcohol and drug histories not on file. Allergies:  Allergies  Allergen Reactions  . Sulfonamide Derivatives   . Tetracycline    Medications Prior to Admission  Medication Dose Route Frequency Provider Last Rate Last Dose  . 0.9 %  sodium chloride infusion   Intravenous Once Rachal A David 100 mL/hr at 04/02/11 2007    . 0.9 %  sodium chloride infusion   Intravenous Continuous Rachal A David 75 mL/hr at 04/03/11 0810    . benazepril (LOTENSIN) tablet 20 mg  20 mg Oral Daily Rachal A David   20 mg at  04/04/11 0935  . coumadin book   Does not apply Once [place] R Smith, PHARMD      . diazepam (VALIUM) tablet 5 mg  5 mg Oral QHS PRN Rachal A David      . digoxin (LANOXIN) tablet 250 mcg  250 mcg Oral Daily Rachal A David   250 mcg at 04/04/11 0935  . furosemide (LASIX) tablet 20 mg  20 mg Oral Daily Rachal A David   20 mg at 04/04/11 2233  . glimepiride (AMARYL) tablet 4 mg  4 mg Oral QAC breakfast Rachal A David   4 mg at 04/04/11 4098  . HYDROcodone-acetaminophen (NORCO) 5-325 MG per tablet 1-2 tablet  1-2 tablet  Oral Q6H PRN Chaya Jan   2 tablet at 04/05/11 [phone]  . HYDROmorphone (DILAUDID) 1 MG/ML injection        0.5 mg at 04/02/11 1921  . ketorolac (TORADOL) 30 MG/ML injection 15 mg  15 mg Intravenous Once Felisa Bonier, MD   15 mg at 04/02/11 1930  . metoprolol tartrate (LOPRESSOR) tablet 25 mg  25 mg Oral BID Rachal A David   25 mg at 04/04/11 2225  . morphine 2 MG/ML injection 1 mg  1 mg Intravenous Q2H PRN Rachal A David   1 mg at 04/03/11 1435  . morphine 2 MG/ML injection        2 mg at 04/02/11 2215  . ondansetron (ZOFRAN) tablet 4 mg  4 mg Oral Q6H PRN Rachal A David       Or  . ondansetron (ZOFRAN) injection 4 mg  4 mg Intravenous Q6H PRN Rachal A David      . simvastatin (ZOCOR) tablet 40 mg  40 mg Oral q1800 Rachal A David   40 mg at 04/04/11 1833  . warfarin (COUMADIN) tablet 5 mg  5 mg Oral ONCE-1800 [place] R Smith, PHARMD   5 mg at 04/04/11 4782  . warfarin (COUMADIN) video   Does not apply Once [place] R Smith, [facility]      . DISCONTD: HYDROmorphone (DILAUDID) injection 1 mg  1 mg Intravenous Once Felisa Bonier, MD       Medications Prior to Admission  Medication Sig Dispense Refill  . aspirin (ASPIR-TRIN) 325 MG EC tablet Take 325 mg by mouth daily.        . benazepril (LOTENSIN) 20 MG tablet Take 20 mg by mouth daily.        . digoxin (LANOXIN) 0.25 MG tablet Take 250 mcg by mouth daily.        . furosemide (LASIX) 20 MG tablet Take 20 mg by mouth daily as needed. For fluid       . glimepiride (AMARYL) 4 MG tablet Take 4 mg by mouth daily before breakfast.        . metFORMIN (GLUCOPHAGE) 500 MG tablet Take 1,000 mg by mouth 2 (two) times daily with a meal.        . Multiple Vitamin (MULTIVITAMIN) tablet Take 1 tablet by mouth daily.        . Omega-3 Fatty Acids (FISH OIL) 1200 MG CAPS Take 1 capsule by mouth daily.        . simvastatin (ZOCOR) 40 MG tablet TAKE ONE TABLET BY MOUTH AT BEDTIME.  30 tablet  6  . nitroGLYCERIN (NITROSTAT) 0.4 MG SL tablet Place 1  tablet (0.4 mg total) under the tongue every 5 (five) minutes as needed.  25 tablet  3    Home:  Home Living Lives With: Spouse (Pt cares for wife) Dolores Lory Help From: Family (All family members work.  Not able to provide 24 hour assist) Type of Home: House Home Layout: Able to live on main level with bedroom/bathroom;Two level Home Access: Ramped entrance Bathroom Shower/Tub: Walk-in shower Bathroom Toilet: Handicapped height Home Adaptive Equipment: Bedside commode/3-in-1;Built-in shower seat;Grab bars around toilet;Grab bars in shower;Hand-held shower hose;Shower chair with back;Walker - rolling;Walker - four wheeled;Wheelchair - manual (HOB raises and lowers)  Functional History: Prior Function Level of Independence: Independent with basic ADLs;Independent with homemaking with ambulation;Independent with gait Driving: Yes Functional Status:  Mobility: Bed Mobility Bed Mobility: Yes Supine to Sit: 3: Mod assist;With rails;HOB elevated (Comment degrees) (HOB at 40 degrees) Supine to Sit Details (indicate cue type and reason): Physical assist to move right LE off bed. Cues for technique Sitting - Scoot to Edge of Bed: 3: Mod assist;With rail Sitting - Scoot to [place] of Bed Details (indicate cue type and reason): Cues to try to scoot one hip forward at a time.  Physical assist to support right LE. Transfers Transfers: Yes Sit to Stand: 3: Mod assist;With upper extremity assist;From bed Sit to Stand Details (indicate cue type and reason): Cues for correct safe hand placement. Stand to Sit: 3: Mod assist;With upper extremity assist;With armrests;To chair/3-in-1 Stand to Sit Details: Cues for placement of RLE and hands to sit.  Assist to slowly lower to chair Ambulation/Gait Ambulation/Gait: Yes Ambulation/Gait Assistance: 4: Min assist Ambulation/Gait Assistance Details (indicate cue type and reason): Cues for safe use of RW and for gait sequence.  Cues to stand upright. Ambulation  Distance (Feet): 10 Feet Assistive device: Rolling walker Gait Pattern: Step-to pattern;Decreased step length - left;Decreased stance time - right;Antalgic;Trunk flexed Gait velocity: Slow gait speed Stairs: No    ADL:    Cognition: Cognition Arousal/Alertness: Awake/alert Cognition Arousal/Alertness: Awake/alert Overall Cognitive Status: Appears within functional limits for tasks assessed  Blood pressure 146/78, pulse 85, temperature 99.7 F (37.6 C), temperature source Oral, resp. rate 18, height 5\' 11"  (1.803 m), weight 89.223 kg (196 lb 11.2 oz), SpO2 93.00%. Physical Exam  Constitutional: He is oriented to person, place, and time. He appears well-developed.  HENT:  Head: Normocephalic.  Neck: Normal range of motion. Neck supple. No thyromegaly present.  Cardiovascular: Normal rate and regular rhythm.   Pulmonary/Chest: Effort normal and breath sounds normal. He has no wheezes.  Abdominal: He exhibits no distension. There is no tenderness.  Musculoskeletal: He exhibits no edema.       Right leg grossly swollen and tender at the knee. Right limb is neurovascularly intact. He is wearing a Bledsoe brace.  Neurological: He is alert and oriented to person, place, and time.  Skin: Skin is warm and dry.       Bledsoe brace right lower extremity.  Psychiatric: He has a normal mood and affect.    Results for orders placed during the hospital encounter of 04/02/11 (from the past 24 hour(s))  GLUCOSE, CAPILLARY     Status: Abnormal   Collection Time   04/04/11  7:42 AM      Component Value Range   Glucose-Capillary 132 (*) 70 - 99 (mg/dL)   Comment 1 Notify RN    GLUCOSE, CAPILLARY     Status: Abnormal   Collection Time   04/04/11 12:27 PM      Component Value Range   Glucose-Capillary 151 (*) 70 - 99 (mg/dL)   Comment 1 Notify RN    GLUCOSE,  CAPILLARY     Status: Abnormal   Collection Time   04/04/11  5:04 PM      Component Value Range   Glucose-Capillary 136 (*) 70 - 99  (mg/dL)   Comment 1 Notify RN    GLUCOSE, CAPILLARY     Status: Abnormal   Collection Time   04/04/11  9:48 PM      Component Value Range   Glucose-Capillary 168 (*) 70 - 99 (mg/dL)  CK     Status: Abnormal   Collection Time   04/04/11 10:15 PM      Component Value Range   Total CK 474 (*) 7 - 232 (U/L)   No results found.  Assessment/Plan: Diagnosis: Right knee dislocate  1. Does the need for close, 24 hr/day medical supervision in concert with the patient's rehab needs make it unreasonable for this patient to be served in a less intensive setting? No 2. Co-Morbidities requiring supervision/potential complications: Not applicable 3. Due to bladder management and bowel management, does the patient require 24 hr/day rehab nursing? No 4. Does the patient require coordinated care of a physician, rehab nurse, and PT to address physical and functional deficits in the context of the above medical diagnosis(es)? No Addressing deficits in the following areas: balance and locomotion 5. Can the patient actively participate in an intensive therapy program of at least 3 hrs of therapy per day at least 5 days per week? Yes 6. The potential for patient to make measurable gains while on inpatient rehab is good 7. Anticipated functional outcomes upon discharge from inpatients are not applicable 8. Estimated rehab length of stay to reach the above functional goals is: Not applicable 9. Does the patient have adequate social supports to accommodate these discharge functional goals? Yes 10. Anticipated D/C setting: Home 11. Anticipated post D/C treatments: Outpt therapy 12. Overall Rehab/Functional Prognosis: excellent  RECOMMENDATIONS: This patient's condition is appropriate for continued rehabilitative care in the following setting: [facility] PT Patient has agreed to participate in recommended program. Yes Note that insurance prior authorization may be required for reimbursement for recommended  care.  Comment: Patient lacks medical necessity for inpatient rehabilitation. He does have the available social supports to provide some assistance as needed at home. Additionally he wants to be home with his wife for emotional purposes.    04/05/2011

## 2011-04-05 NOTE — Plan of Care (Signed)
Problem: Discharge Progression Outcomes Goal: Activity appropriate for discharge plan Outcome: Completed/Met Date Met:  04/05/11 HH PT/OT; wheelchair

## 2011-04-05 NOTE — Progress Notes (Signed)
Recommend home with home health per Dr. Riley Kill. Please call me with any questions pager [phone].

## 2011-04-05 NOTE — Progress Notes (Signed)
Utilization review completed.  

## 2011-04-05 NOTE — Progress Notes (Signed)
   CARE MANAGEMENT NOTE 04/05/2011  Patient:  [name], [name]   Account Number:  [id number]  Date Initiated:  04/05/2011  Documentation initiated by:  Letha Cape  Subjective/Objective Assessment:   dx knee dislocation  admit- lives with spouse.     Action/Plan:   pt/ot eval- recs snf but patient refusing snf, will set up hh.   Anticipated DC Date:  04/05/2011   Anticipated DC Plan:  HOME W HOME HEALTH SERVICES      DC Planning Services  CM consult      [facility] Choice  HOME HEALTH   Choice offered to / List presented to:  C-1 Patient        HH arranged  HH-1 RN  HH-2 PT  HH-3 OT  HH-4 NURSE'S AIDE  HH-6 SOCIAL WORKER      HH agency  [facility]   Status of service:  Completed, signed off Medicare Important Message given?   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  Per physical therapy recs snf but patient refuses. Patient will be dc today with HHRN for pt/inr draw on Wed and send results to pcp, also with HHPT/OT/aide and a Child psychotherapist with [facility]. Referral sent to [facility] via [facility] and Debbie notified.  Soc will begin 24-48 hrs post discharge. Per physical therapy patient has all the eqipment he needs at home except for a elevated leg rest to go on his wheelchair,per [facility] DME patient will need to purchase this from  [employer].

## 2011-05-13 ENCOUNTER — Ambulatory Visit: Payer: Medicare Other | Admitting: Orthopedic Surgery

## 2011-05-26 ENCOUNTER — Encounter: Payer: Self-pay | Admitting: Orthopedic Surgery

## 2011-05-26 ENCOUNTER — Ambulatory Visit (INDEPENDENT_AMBULATORY_CARE_PROVIDER_SITE_OTHER): Payer: Medicare Other | Admitting: Orthopedic Surgery

## 2011-05-26 VITALS — Ht 71.0 in | Wt 166.0 lb

## 2011-05-26 DIAGNOSIS — S83106A Unspecified dislocation of unspecified knee, initial encounter: Secondary | ICD-10-CM

## 2011-05-26 DIAGNOSIS — S83104A Unspecified dislocation of right knee, initial encounter: Secondary | ICD-10-CM

## 2011-05-26 NOTE — Progress Notes (Signed)
Patient ID: [name], male   DOB: 08/25/1926, 78 y.o.   MRN: [id number]  Consult followup. Patient had a RIGHT knee dislocation in December with closed reduction of centigrays birth observation and further evaluation. If no vascular injury. He did have a footdrop which has resolved. He is now ambulatory with a cane.  His knee is completely stable in all planes. He has normal function in his foot, including neurovascular function. He could evert f flex, invert, with normal perfusion and pulse.  Exam of her with her walker. He can transition to a, cane. He's had therapy at home and has one more session.  He is advised to use a cane as needed. Follow up as needed

## 2011-05-26 NOTE — Patient Instructions (Signed)
Cane as needed

## 2011-06-23 ENCOUNTER — Other Ambulatory Visit: Payer: Self-pay | Admitting: Cardiology

## 2011-10-22 ENCOUNTER — Other Ambulatory Visit: Payer: Self-pay | Admitting: Cardiology

## 2011-11-19 ENCOUNTER — Other Ambulatory Visit: Payer: Self-pay | Admitting: *Deleted

## 2011-11-19 MED ORDER — SIMVASTATIN 40 MG PO TABS: 40.0000 mg | ORAL_TABLET | Freq: Every day | ORAL | Status: DC

## 2012-05-22 ENCOUNTER — Ambulatory Visit (INDEPENDENT_AMBULATORY_CARE_PROVIDER_SITE_OTHER): Payer: Medicare Other | Admitting: Cardiology

## 2012-05-22 ENCOUNTER — Encounter: Payer: Self-pay | Admitting: Cardiology

## 2012-05-22 VITALS — BP 156/77 | HR 67 | Ht 70.0 in | Wt 189.0 lb

## 2012-05-22 DIAGNOSIS — R0602 Shortness of breath: Secondary | ICD-10-CM

## 2012-05-22 DIAGNOSIS — I2510 Atherosclerotic heart disease of native coronary artery without angina pectoris: Secondary | ICD-10-CM

## 2012-05-22 DIAGNOSIS — R9430 Abnormal result of cardiovascular function study, unspecified: Secondary | ICD-10-CM

## 2012-05-22 DIAGNOSIS — R0989 Other specified symptoms and signs involving the circulatory and respiratory systems: Secondary | ICD-10-CM

## 2012-05-22 DIAGNOSIS — I498 Other specified cardiac arrhythmias: Secondary | ICD-10-CM

## 2012-05-22 DIAGNOSIS — R001 Bradycardia, unspecified: Secondary | ICD-10-CM

## 2012-05-22 MED ORDER — ASPIRIN EC 81 MG PO TBEC: 81.0000 mg | DELAYED_RELEASE_TABLET | Freq: Every day | ORAL | Status: AC

## 2012-05-22 NOTE — Assessment & Plan Note (Addendum)
With the low dose of Lasix his shortness of breath is reasonably controlled. I have arranged for him to have a digoxin level And chemistry and a TSH. This information will be sent to his primary physician.

## 2012-05-22 NOTE — Progress Notes (Signed)
HPI Patient is seen to followup coronary artery disease. I have known this patient for close to 30 years. He is doing well. He has known significant coronary disease it has been very stable. He's not been having any chest pain. When I saw him last we were following the addition of a small dose of Lasix. This had helped his slight orthopnea. He continues to have some mild swelling in his right leg that disappears during the night. He does not have edema in the left leg. He's not having any marked dizziness.  Allergies  Allergen Reactions  . Sulfonamide Derivatives   . Tetracycline     Current Outpatient Prescriptions  Medication Sig Dispense Refill  . aspirin (ASPIR-TRIN) 325 MG EC tablet Take 325 mg by mouth daily.        . benazepril (LOTENSIN) 20 MG tablet Take 20 mg by mouth daily.        . diazepam (VALIUM) 5 MG tablet Take 5 mg by mouth at bedtime as needed. For nerves       . digoxin (LANOXIN) 0.25 MG tablet Take 250 mcg by mouth daily.        . furosemide (LASIX) 20 MG tablet TAKE 1 TABLET DAILY  30 tablet  1  . glimepiride (AMARYL) 4 MG tablet Take 4 mg by mouth daily before breakfast.        . metFORMIN (GLUCOPHAGE) 500 MG tablet Take 1,000 mg by mouth 2 (two) times daily with a meal.        . metoprolol tartrate (LOPRESSOR) 25 MG tablet Take 25 mg by mouth 2 (two) times daily.        . Multiple Vitamin (MULTIVITAMIN) tablet Take 1 tablet by mouth daily.        . Omega-3 Fatty Acids (FISH OIL) 1200 MG CAPS Take 1 capsule by mouth daily.        . simvastatin (ZOCOR) 40 MG tablet Take 1 tablet (40 mg total) by mouth at bedtime.  7 tablet  0    History   Social History  . Marital Status: Married    Spouse Name: N/A    Number of Children: 4  . Years of Education: N/A   Occupational History  . RETIRED    Social History Main Topics  . Smoking status: Former Smoker -- 2.0 packs/day for 30 years    Types: Cigarettes    Quit date: 04/26/1980  . Smokeless tobacco: Never Used   . Alcohol Use: Not on file  . Drug Use: Not on file  . Sexually Active: Not on file   Other Topics Concern  . Not on file   Social History Narrative  . No narrative on file    Family History  Problem Relation Age of Onset  . Heart attack Mother 1    Past Medical History  Diagnosis Date  . Pneumonia     left lower lobe  . Hx of CABG   . Asbestos exposure   . Dyslipidemia   . Nephrolithiasis   . PVD (peripheral vascular disease)   . Diabetes mellitus   . Renal cyst   . Colitis   . Hypertension   . CAD (coronary artery disease) 2003    last catheterization 2003, potential ischemia in the ramus, grafts patent  ( last nuclear 2002)  . TIA (transient ischemic attack) 2007    ?? medical Rx   . Dizziness     He has had falls related to this /  Chronic dizziness  . Bradycardia     MILD  . Carotid artery disease 2001    BILATERAL 0-39%/  Follow with Dopplers by Dr. Sherril Croon  . SOB (shortness of breath)   . Ejection fraction     50%, echo, January, 2012, no mention of significant diastolic dysfunction  . Shortness of breath     May, 2012  . Aortic insufficiency     Mild, echo, January, 2012    Past Surgical History  Procedure Date  . Coronary artery bypass graft   . Percutaneous transluminal coronary angioplasty     hx    Patient Active Problem List  Diagnosis  . DIABETES MELLITUS  . COLITIS, HX OF  . NEPHROLITHIASIS, HX OF  . ASBESTOS EXPOSURE, HX OF  . Hx of CABG  . Dyslipidemia  . Hypertension  . CAD (coronary artery disease)  . TIA (transient ischemic attack)  . Bradycardia  . Shortness of breath  . Dizziness  . Carotid artery disease  . Ejection fraction  . Knee dislocation  . Right knee dislocation    ROS  Patient denies fever, chills, headache, sweats, rash, change in vision, change in hearing, chest pain, cough, nausea vomiting, urinary symptoms. All other systems are reviewed and are negative.  PHYSICAL EXAM Patient is here with his wife who  is in a wheelchair. He is also here with her daughter. He is oriented to person time and place. Affect is normal. He's having continued discomfort with his knee. On this basis he cannot be as active as he would like to be. However he is able to ambulate in the is not having any exertional shortness of breath. There is no jugulovenous distention. Lungs are clear. Respiratory effort is nonlabored. Cardiac exam reveals S1 and S2. There no clicks or significant murmurs. The abdomen is soft. There is trace peripheral edema in the right lower extremity and no edema in the left lower extremity.  Filed Vitals:   05/22/12 1339  BP: 156/77  Pulse: 67  Height: 5\' 10"  (1.778 m)  Weight: 189 lb (85.73 kg)     ASSESSMENT & PLAN

## 2012-05-22 NOTE — Patient Instructions (Addendum)
   Labs for TSH, BMET, Digoxin level (needs to be drawn just before morning dose)  Office will contact with results  Decrease Aspirin to 81mg  daily Continue all other current medications. Your physician wants you to follow up in: 6 months.  You will receive a reminder letter in the mail one-two months in advance.  If you don't receive a letter, please call our office to schedule the follow up appointment

## 2012-05-22 NOTE — Assessment & Plan Note (Signed)
Ejection fraction was 50% by echo January, 2012. He does not need a followup study at this time.

## 2012-05-22 NOTE — Assessment & Plan Note (Addendum)
Coronary status is stable. His last catheterization was in 2003. I have chosen not to pursue aggressive testing unless he has symptoms.We will reduce his aspirin to 81 mg daily.

## 2012-05-29 ENCOUNTER — Encounter: Payer: Self-pay | Admitting: Cardiology

## 2012-06-02 ENCOUNTER — Telehealth: Payer: Self-pay | Admitting: *Deleted

## 2012-06-02 NOTE — Telephone Encounter (Signed)
Patient informed. Copy sent to PCP °

## 2012-06-02 NOTE — Telephone Encounter (Signed)
Message copied by Eustace Moore on Fri Jun 02, 2012  4:29 PM ------      Message from: Myrtis Ser, [place] D      Created: Fri Jun 02, 2012 10:14 AM       Please let the patient know that his lab tests were normal. Please be sure that a copy has been sent to his primary physician.

## 2012-06-10 ENCOUNTER — Encounter: Payer: Self-pay | Admitting: Cardiology

## 2012-06-10 DIAGNOSIS — I340 Nonrheumatic mitral (valve) insufficiency: Secondary | ICD-10-CM | POA: Insufficient documentation

## 2012-06-10 DIAGNOSIS — I351 Nonrheumatic aortic (valve) insufficiency: Secondary | ICD-10-CM | POA: Insufficient documentation

## 2012-11-29 ENCOUNTER — Encounter: Payer: Self-pay | Admitting: Cardiology

## 2012-11-29 ENCOUNTER — Ambulatory Visit (INDEPENDENT_AMBULATORY_CARE_PROVIDER_SITE_OTHER): Payer: Medicare Other | Admitting: Cardiology

## 2012-11-29 VITALS — BP 143/76 | HR 59 | Ht 71.0 in | Wt 184.8 lb

## 2012-11-29 DIAGNOSIS — I351 Nonrheumatic aortic (valve) insufficiency: Secondary | ICD-10-CM

## 2012-11-29 DIAGNOSIS — I340 Nonrheumatic mitral (valve) insufficiency: Secondary | ICD-10-CM

## 2012-11-29 DIAGNOSIS — I059 Rheumatic mitral valve disease, unspecified: Secondary | ICD-10-CM

## 2012-11-29 DIAGNOSIS — I359 Nonrheumatic aortic valve disorder, unspecified: Secondary | ICD-10-CM

## 2012-11-29 DIAGNOSIS — Z77090 Contact with and (suspected) exposure to asbestos: Secondary | ICD-10-CM

## 2012-11-29 DIAGNOSIS — I2510 Atherosclerotic heart disease of native coronary artery without angina pectoris: Secondary | ICD-10-CM

## 2012-11-29 DIAGNOSIS — I498 Other specified cardiac arrhythmias: Secondary | ICD-10-CM

## 2012-11-29 DIAGNOSIS — R0602 Shortness of breath: Secondary | ICD-10-CM

## 2012-11-29 DIAGNOSIS — R001 Bradycardia, unspecified: Secondary | ICD-10-CM

## 2012-11-29 NOTE — Progress Notes (Signed)
HPI   Patient is seen today to followup coronary artery disease. I had seen him in January, 2014. He was not having any significant chest pain at that time. Because he was on a small dose of diuretic, labs were checked. BUN was 13. Creatinine 0.99. Potassium was 4.2. TSH was 1.92 and digoxin level was good at 1.2.  Since his last visit he had a 2-D echo at his primary care office. Results are discussed below.  Since that time he is done relatively well. He does have some exertional fatigue. He has some vague chest tightness that does not sound like angina.   Allergies  Allergen Reactions  . Sulfonamide Derivatives   . Tetracycline     Current Outpatient Prescriptions  Medication Sig Dispense Refill  . aspirin EC 81 MG tablet Take 1 tablet (81 mg total) by mouth daily.      . benazepril (LOTENSIN) 20 MG tablet Take 20 mg by mouth daily.        . citalopram (CELEXA) 20 MG tablet Take 20 mg by mouth daily.       . diazepam (VALIUM) 5 MG tablet Take 5 mg by mouth at bedtime as needed. For nerves       . digoxin (LANOXIN) 0.25 MG tablet Take 250 mcg by mouth daily.        . furosemide (LASIX) 20 MG tablet TAKE 1 TABLET DAILY  30 tablet  1  . glipiZIDE (GLUCOTROL XL) 5 MG 24 hr tablet Take 5 mg by mouth daily.      . metFORMIN (GLUCOPHAGE) 500 MG tablet Take 1,000 mg by mouth 2 (two) times daily with a meal.        . metoprolol tartrate (LOPRESSOR) 25 MG tablet Take 25 mg by mouth 2 (two) times daily.        . Multiple Vitamin (MULTIVITAMIN) tablet Take 1 tablet by mouth daily.        . Omega-3 Fatty Acids (FISH OIL) 1200 MG CAPS Take 1 capsule by mouth daily.        Marland Kitchen rOPINIRole (REQUIP) 1 MG tablet Take 1 mg by mouth at bedtime.       . simvastatin (ZOCOR) 40 MG tablet Take 1 tablet (40 mg total) by mouth at bedtime.  7 tablet  0   No current facility-administered medications for this visit.    History   Social History  . Marital Status: Married    Spouse Name: N/A    Number of  Children: 4  . Years of Education: N/A   Occupational History  . RETIRED    Social History Main Topics  . Smoking status: Former Smoker -- 2.00 packs/day for 30 years    Types: Cigarettes    Quit date: 04/26/1980  . Smokeless tobacco: Never Used  . Alcohol Use: Not on file  . Drug Use: Not on file  . Sexually Active: Not on file   Other Topics Concern  . Not on file   Social History Narrative  . No narrative on file    Family History  Problem Relation Age of Onset  . Heart attack Mother 56    Past Medical History  Diagnosis Date  . Pneumonia     left lower lobe  . Hx of CABG   . Asbestos exposure   . Dyslipidemia   . Nephrolithiasis   . PVD (peripheral vascular disease)   . Diabetes mellitus   . Renal cyst   . Colitis   .  Hypertension   . CAD (coronary artery disease) 2003    last catheterization 2003, potential ischemia in the ramus, grafts patent  ( last nuclear 2002)  . TIA (transient ischemic attack) 2007    ?? medical Rx   . Dizziness     He has had falls related to this / Chronic dizziness  . Bradycardia     MILD  . Carotid artery disease 2001    BILATERAL 0-39%/  Follow with Dopplers by Dr. Sherril Croon  . SOB (shortness of breath)   . Ejection fraction     50%, echo, January, 2012, no mention of significant diastolic dysfunction  . Shortness of breath     May, 2012  . Aortic insufficiency     Mild, echo, January, 2012  //   Mild, echo, February, 2014  . Mitral regurgitation     Mild, echo, February, 2014    Past Surgical History  Procedure Laterality Date  . Coronary artery bypass graft    . Percutaneous transluminal coronary angioplasty      hx    Patient Active Problem List   Diagnosis Date Noted  . Mitral regurgitation   . Aortic insufficiency   . Right knee dislocation 05/26/2011  . Knee dislocation 04/03/2011  . Carotid artery disease   . Ejection fraction   . Shortness of breath   . Dizziness   . Hx of CABG   . Dyslipidemia   .  Hypertension   . CAD (coronary artery disease)   . TIA (transient ischemic attack)   . Bradycardia   . DIABETES MELLITUS 12/20/2008  . COLITIS, HX OF 12/20/2008  . NEPHROLITHIASIS, HX OF 12/20/2008  . ASBESTOS EXPOSURE, HX OF 12/20/2008    ROS   Patient denies fever, chills, headache, sweats, rash, change in vision, change in hearing, nausea vomiting, urinary symptoms. All other systems are reviewed and are negative.  PHYSICAL EXAM   Patient is oriented to person time and place. Affect is normal. He's here with his wife and his daughter. There is no jugulovenous distention. Lungs are clear. Respiratory effort is not labored. Cardiac exam reveals S1 and S2. There no clicks. There is a soft systolic murmur. The abdomen is soft. Is no peripheral edema.  Filed Vitals:   11/29/12 1317  BP: 143/76  Pulse: 59  Height: 5\' 11"  (1.803 m)  Weight: 184 lb 12.8 oz (83.825 kg)     ASSESSMENT & PLAN

## 2012-11-29 NOTE — Patient Instructions (Addendum)
Your physician recommends that you schedule a follow-up appointment in: 3 months. You will receive a reminder letter in the mail in about 1-2 months reminding you to call and schedule your appointment. If you don't receive this letter, please contact our office. Your physician recommends that you continue on your current medications as directed. Please refer to the Current Medication list given to you today. A chest x-ray takes a picture of the organs and structures inside the chest, including the heart, lungs, and blood vessels. This test can show several things, including, whether the heart is enlarges; whether fluid is building up in the lungs; and whether pacemaker / defibrillator leads are still in place.

## 2012-11-29 NOTE — Assessment & Plan Note (Signed)
Patient is wondering about his history of asbestos exposure. It is time to do a followup chest x-ray and this will be arranged.

## 2012-11-29 NOTE — Assessment & Plan Note (Signed)
He does have mild bradycardia but he has not had symptoms. No further workup.

## 2012-11-29 NOTE — Assessment & Plan Note (Signed)
Mitral regurgitation is mild by echo, February, 2014.

## 2012-11-29 NOTE — Assessment & Plan Note (Signed)
Echo in April, 2014 revealed that aortic insufficiency continues to be mild. No further workup.

## 2012-11-29 NOTE — Assessment & Plan Note (Signed)
Coronary disease is stable. I've chosen not to do any exercise testing.

## 2012-11-29 NOTE — Assessment & Plan Note (Signed)
He does have some exertional shortness of breath. There is no PND or orthopnea. Chest x-ray will be done. Otherwise no further workup. I will plan to see him back in 3 months.

## 2012-12-02 ENCOUNTER — Encounter: Payer: Self-pay | Admitting: Cardiology

## 2012-12-05 ENCOUNTER — Telehealth: Payer: Self-pay | Admitting: *Deleted

## 2012-12-05 NOTE — Telephone Encounter (Signed)
Patient informed via message machine. 

## 2013-03-24 ENCOUNTER — Encounter: Payer: Self-pay | Admitting: Cardiology

## 2013-03-27 ENCOUNTER — Encounter: Payer: Self-pay | Admitting: Cardiology

## 2013-03-27 ENCOUNTER — Ambulatory Visit (INDEPENDENT_AMBULATORY_CARE_PROVIDER_SITE_OTHER): Payer: Medicare Other | Admitting: Cardiology

## 2013-03-27 VITALS — BP 166/75 | HR 50 | Ht 71.0 in | Wt 179.8 lb

## 2013-03-27 DIAGNOSIS — R001 Bradycardia, unspecified: Secondary | ICD-10-CM

## 2013-03-27 DIAGNOSIS — R0602 Shortness of breath: Secondary | ICD-10-CM

## 2013-03-27 DIAGNOSIS — I2510 Atherosclerotic heart disease of native coronary artery without angina pectoris: Secondary | ICD-10-CM

## 2013-03-27 DIAGNOSIS — I10 Essential (primary) hypertension: Secondary | ICD-10-CM

## 2013-03-27 DIAGNOSIS — I498 Other specified cardiac arrhythmias: Secondary | ICD-10-CM

## 2013-03-27 MED ORDER — METOPROLOL TARTRATE 25 MG PO TABS: 12.5000 mg | ORAL_TABLET | Freq: Two times a day (BID) | ORAL | Status: DC

## 2013-03-27 NOTE — Assessment & Plan Note (Signed)
His shortness of breath is stable. He does not have signs of heart failure. We did a chest x-ray after I saw him in August, 2014. There was mild hyperinflation. There was no CHF. There was no obvious evidence of asbestos. No further workup.

## 2013-03-27 NOTE — Assessment & Plan Note (Signed)
Systolic pressure is mildly elevated today. Also I am reducing his metoprolol dose slightly. He will check his pressure at home. If he has pressures that are above 150 on a regular basis we will slowly increase other medications. I want to be very careful because of his history of dizziness even though it is not orthostatic.

## 2013-03-27 NOTE — Patient Instructions (Signed)
Your physician recommends that you schedule a follow-up appointment in: 6 months. You will receive a reminder letter in the mail in about 4 months reminding you to call and schedule your appointment. If you don't receive this letter, please contact our office. Your physician has recommended you make the following change in your medication: Decrease your metoprolol tartrate to 12.5 mg twice daily. You may break your 25 mg tablet in half twice daily. Your new prescription has been sent to your pharmacy. All other medications will remain the same.

## 2013-03-27 NOTE — Progress Notes (Signed)
HPI  Patient is seen in followup coronary disease. He also has a problem with dizziness this is felt not to be cardiac in origin. We know his ejection fraction was 45% when checked by echo earlier this year. He has old inferior posterior wall motion abnormality. He's not having any chest pain. Marland Kitchen His dizziness is not orthostatic.  He does have a problem with some fatigue. It is noted that his heart rate is 50.  Allergies  Allergen Reactions  . Sulfonamide Derivatives   . Tetracycline     Current Outpatient Prescriptions  Medication Sig Dispense Refill  . aspirin EC 81 MG tablet Take 1 tablet (81 mg total) by mouth daily.      . benazepril (LOTENSIN) 20 MG tablet Take 20 mg by mouth daily.        . citalopram (CELEXA) 20 MG tablet Take 20 mg by mouth daily.       . diazepam (VALIUM) 5 MG tablet Take 5 mg by mouth at bedtime as needed. For nerves       . digoxin (LANOXIN) 0.25 MG tablet Take 250 mcg by mouth daily.        . furosemide (LASIX) 20 MG tablet TAKE 1 TABLET DAILY  30 tablet  1  . glipiZIDE (GLUCOTROL XL) 5 MG 24 hr tablet Take 5 mg by mouth daily.      . metFORMIN (GLUCOPHAGE) 500 MG tablet Take 1,000 mg by mouth 2 (two) times daily with a meal.        . metoprolol tartrate (LOPRESSOR) 25 MG tablet Take 25 mg by mouth 2 (two) times daily.        . Multiple Vitamin (MULTIVITAMIN) tablet Take 1 tablet by mouth daily.        . Omega-3 Fatty Acids (FISH OIL) 1200 MG CAPS Take 1 capsule by mouth daily.        Marland Kitchen rOPINIRole (REQUIP) 1 MG tablet Take 1 mg by mouth at bedtime.       . simvastatin (ZOCOR) 40 MG tablet Take 1 tablet (40 mg total) by mouth at bedtime.  7 tablet  0   No current facility-administered medications for this visit.    History   Social History  . Marital Status: Married    Spouse Name: N/A    Number of Children: 4  . Years of Education: N/A   Occupational History  . RETIRED    Social History Main Topics  . Smoking status: Former Smoker -- 2.00  packs/day for 30 years    Types: Cigarettes    Quit date: 04/26/1980  . Smokeless tobacco: Never Used  . Alcohol Use: Not on file  . Drug Use: Not on file  . Sexual Activity: Not on file   Other Topics Concern  . Not on file   Social History Narrative  . No narrative on file    Family History  Problem Relation Age of Onset  . Heart attack Mother 35    Past Medical History  Diagnosis Date  . Pneumonia     left lower lobe  . Hx of CABG   . Asbestos exposure   . Dyslipidemia   . Nephrolithiasis   . PVD (peripheral vascular disease)   . Diabetes mellitus   . Renal cyst   . Colitis   . Hypertension   . CAD (coronary artery disease) 2003    last catheterization 2003, potential ischemia in the ramus, grafts patent  ( last nuclear 2002)  .  TIA (transient ischemic attack) 2007    ?? medical Rx   . Dizziness     He has had falls related to this / Chronic dizziness  . Bradycardia     MILD  . Carotid artery disease 2001    BILATERAL 0-39%/  Follow with Dopplers by Dr. Sherril Croon  . SOB (shortness of breath)   . Ejection fraction     50%, echo, January, 2012, no mention of significant diastolic dysfunction  . Shortness of breath     May, 2012  . Aortic insufficiency     Mild, echo, January, 2012  //   Mild, echo, February, 2014  . Mitral regurgitation     Mild, echo, February, 2014    Past Surgical History  Procedure Laterality Date  . Coronary artery bypass graft    . Percutaneous transluminal coronary angioplasty      hx    Patient Active Problem List   Diagnosis Date Noted  . Mitral regurgitation   . Aortic insufficiency   . Right knee dislocation 05/26/2011  . Knee dislocation 04/03/2011  . Carotid artery disease   . Ejection fraction   . Shortness of breath   . Dizziness   . Hx of CABG   . Dyslipidemia   . Hypertension   . CAD (coronary artery disease)   . TIA (transient ischemic attack)   . Bradycardia   . DIABETES MELLITUS 12/20/2008  . COLITIS, HX OF  12/20/2008  . NEPHROLITHIASIS, HX OF 12/20/2008  . ASBESTOS EXPOSURE, HX OF 12/20/2008    ROS   Patient denies fever, chills, headache, sweats, rash, change in vision, change in hearing, chest pain, cough, nausea vomiting, urinary symptoms. All other systems are reviewed and are negative.  PHYSICAL EXAM  Patient is oriented to person time and place. Affect is normal. He is here with his wife and daughter. There is no jugulovenous distention. Lungs are clear. Respiratory effort is nonlabored. Cardiac exam reveals S1 and S2. The abdomen is soft. There's no peripheral edema.  Filed Vitals:   03/27/13 1259  BP: 166/75  Pulse: 50  Height: 5\' 11"  (1.803 m)  Weight: 179 lb 12.8 oz (81.557 kg)  SpO2: 97%     ASSESSMENT & PLAN

## 2013-03-27 NOTE — Assessment & Plan Note (Signed)
Coronary disease is stable. He's not had an exercise test since 2002. However I feel it is not indicated at this time.

## 2013-03-27 NOTE — Assessment & Plan Note (Signed)
He has bradycardia. His rate is even slower today. I will decrease his metoprolol to 12.5 twice a day.

## 2013-09-20 ENCOUNTER — Encounter: Payer: Self-pay | Admitting: Cardiology

## 2013-09-20 ENCOUNTER — Ambulatory Visit (INDEPENDENT_AMBULATORY_CARE_PROVIDER_SITE_OTHER): Payer: Medicare Other | Admitting: Cardiology

## 2013-09-20 VITALS — BP 114/64 | HR 64 | Ht 70.0 in | Wt 174.1 lb

## 2013-09-20 DIAGNOSIS — E785 Hyperlipidemia, unspecified: Secondary | ICD-10-CM

## 2013-09-20 DIAGNOSIS — I2510 Atherosclerotic heart disease of native coronary artery without angina pectoris: Secondary | ICD-10-CM

## 2013-09-20 DIAGNOSIS — I498 Other specified cardiac arrhythmias: Secondary | ICD-10-CM

## 2013-09-20 DIAGNOSIS — I10 Essential (primary) hypertension: Secondary | ICD-10-CM

## 2013-09-20 DIAGNOSIS — R001 Bradycardia, unspecified: Secondary | ICD-10-CM

## 2013-09-20 DIAGNOSIS — R0602 Shortness of breath: Secondary | ICD-10-CM

## 2013-09-20 NOTE — Assessment & Plan Note (Signed)
He is not having any recurrent significant shortness of breath. No further workup.

## 2013-09-20 NOTE — Assessment & Plan Note (Signed)
Coronary disease is stable. His last evaluation was in 2002. He has no symptoms. There is no reason to do exercise testing.

## 2013-09-20 NOTE — Assessment & Plan Note (Signed)
His rate is stable. No change in therapy.

## 2013-09-20 NOTE — Assessment & Plan Note (Signed)
Lipids are being treated. No change in therapy.

## 2013-09-20 NOTE — Assessment & Plan Note (Signed)
Blood pressure is stable. No change in therapy. 

## 2013-09-20 NOTE — Progress Notes (Signed)
Patient ID: [name], male   DOB: [birth year], 80 y.o.   MRN: [id number]    HPI  Patient returns for follow up of coronary disease. He is doing very well. His cardiac status is stable. He is active doing work in his yard. He has some mild persistent dizziness that is not due to orthostasis. We did check his ejection fraction last year and was in the 45% range. Over the years he lost significant weight when he was immobilized for a period of time. However his weight is now stable.  Allergies  Allergen Reactions  . Sulfonamide Derivatives   . Tetracycline     Current Outpatient Prescriptions  Medication Sig Dispense Refill  . aspirin EC 81 MG tablet Take 1 tablet (81 mg total) by mouth daily.      . benazepril (LOTENSIN) 20 MG tablet Take 20 mg by mouth daily.        . citalopram (CELEXA) 20 MG tablet Take 20 mg by mouth daily.       . diazepam (VALIUM) 5 MG tablet Take 5 mg by mouth at bedtime as needed. For nerves       . digoxin (LANOXIN) 0.25 MG tablet Take 250 mcg by mouth daily.        . furosemide (LASIX) 20 MG tablet TAKE 1 TABLET DAILY  30 tablet  1  . glipiZIDE (GLUCOTROL XL) 5 MG 24 hr tablet Take 5 mg by mouth daily.      . metFORMIN (GLUMETZA) 1000 MG (MOD) 24 hr tablet Take 1,000 mg by mouth 2 (two) times daily.      . metoprolol tartrate (LOPRESSOR) 25 MG tablet Take 0.5 tablets (12.5 mg total) by mouth 2 (two) times daily.  30 tablet  6  . Multiple Vitamin (MULTIVITAMIN) tablet Take 1 tablet by mouth daily.        . Omega-3 Fatty Acids (FISH OIL) 1200 MG CAPS Take 1 capsule by mouth daily.        Marland Kitchen rOPINIRole (REQUIP) 1 MG tablet Take 1 mg by mouth at bedtime.       . simvastatin (ZOCOR) 40 MG tablet Take 1 tablet (40 mg total) by mouth at bedtime.  7 tablet  0   No current facility-administered medications for this visit.    History   Social History  . Marital Status: Married    Spouse Name: N/A    Number of Children: 4  . Years of Education: N/A    Occupational History  . RETIRED    Social History Main Topics  . Smoking status: Former Smoker -- 2.00 packs/day for 30 years    Types: Cigarettes    Quit date: 04/26/1980  . Smokeless tobacco: Never Used  . Alcohol Use: Not on file  . Drug Use: Not on file  . Sexual Activity: Not on file   Other Topics Concern  . Not on file   Social History Narrative  . No narrative on file    Family History  Problem Relation Age of Onset  . Heart attack Mother 24    Past Medical History  Diagnosis Date  . Pneumonia     left lower lobe  . Hx of CABG   . Asbestos exposure   . Dyslipidemia   . Nephrolithiasis   . PVD (peripheral vascular disease)   . Diabetes mellitus   . Renal cyst   . Colitis   . Hypertension   . CAD (coronary artery disease) 2003  last catheterization 2003, potential ischemia in the ramus, grafts patent  ( last nuclear 2002)  . TIA (transient ischemic attack) 2007    ?? medical Rx   . Dizziness     He has had falls related to this / Chronic dizziness  . Bradycardia     MILD  . Carotid artery disease 2001    BILATERAL 0-39%/  Follow with Dopplers by Dr. Woody Seller  . SOB (shortness of breath)   . Ejection fraction     50%, echo, January, 2012, no mention of significant diastolic dysfunction  . Shortness of breath     May, 2012  . Aortic insufficiency     Mild, echo, January, 2012  //   Mild, echo, February, 2014  . Mitral regurgitation     Mild, echo, February, 2014    Past Surgical History  Procedure Laterality Date  . Coronary artery bypass graft    . Percutaneous transluminal coronary angioplasty      hx    Patient Active Problem List   Diagnosis Date Noted  . Mitral regurgitation   . Aortic insufficiency   . Right knee dislocation 05/26/2011  . Knee dislocation 04/03/2011  . Carotid artery disease   . Ejection fraction   . Shortness of breath   . Dizziness   . Hx of CABG   . Dyslipidemia   . Hypertension   . CAD (coronary artery  disease)   . TIA (transient ischemic attack)   . Bradycardia   . DIABETES MELLITUS 12/20/2008  . COLITIS, HX OF 12/20/2008  . NEPHROLITHIASIS, HX OF 12/20/2008  . ASBESTOS EXPOSURE, HX OF 12/20/2008    ROS  Patient denies fever, chills, headache, sweats, rash, change in vision, change in hearing, chest pain, cough, nausea or vomiting, urinary symptoms. All other systems are reviewed and are negative.  PHYSICAL EXAM  Patient is oriented to person time and place. Affect is normal. Head is atraumatic. Conjunctiva and sclera are normal. There is no jugulovenous distention. Lungs are clear. Respiratory effort is nonlabored. Cardiac exam reveals S1 and S2. Abdomen is soft. There is no peripheral edema. There no musculoskeletal deformities. There are no skin rashes.  Filed Vitals:   09/20/13 1518  BP: 114/64  Pulse: 64  Height: 5\' 10"  (1.778 m)  Weight: 174 lb 1.9 oz (78.98 kg)  SpO2: 97%   EKG is done today and reviewed by me. There is no change from the past. There sinus rhythm. There is old interventricular conduction delay and old inferior Q waves. There is no change from the past.  ASSESSMENT & PLAN

## 2013-09-20 NOTE — Patient Instructions (Signed)

## 2013-11-19 ENCOUNTER — Other Ambulatory Visit: Payer: Self-pay | Admitting: *Deleted

## 2013-11-19 MED ORDER — METOPROLOL TARTRATE 25 MG PO TABS: 12.5000 mg | ORAL_TABLET | Freq: Two times a day (BID) | ORAL | Status: DC

## 2014-02-26 ENCOUNTER — Encounter: Payer: Self-pay | Admitting: Cardiology

## 2014-02-26 ENCOUNTER — Ambulatory Visit (INDEPENDENT_AMBULATORY_CARE_PROVIDER_SITE_OTHER): Payer: Medicare Other | Admitting: Cardiology

## 2014-02-26 VITALS — BP 116/68 | HR 80 | Ht 70.0 in | Wt 180.0 lb

## 2014-02-26 DIAGNOSIS — I340 Nonrheumatic mitral (valve) insufficiency: Secondary | ICD-10-CM

## 2014-02-26 DIAGNOSIS — I25810 Atherosclerosis of coronary artery bypass graft(s) without angina pectoris: Secondary | ICD-10-CM

## 2014-02-26 DIAGNOSIS — I2510 Atherosclerotic heart disease of native coronary artery without angina pectoris: Secondary | ICD-10-CM

## 2014-02-26 NOTE — Progress Notes (Signed)
Patient ID: [name], male   DOB: 1926-08-31, 80 y.o.   MRN: [id number]    HPI  patient is seen to follow up coronary disease. His cardiac status is stable. He has spent many many years trying to help take care of his elderly wife. She is wheelchair bound. He is not having any chest pain or shortness of breath.  Historically he has some inguinal hernias. At this point he does not need surgical repair.  Allergies  Allergen Reactions  . Sulfonamide Derivatives   . Tetracycline     Current Outpatient Prescriptions  Medication Sig Dispense Refill  . aspirin EC 81 MG tablet Take 1 tablet (81 mg total) by mouth daily.    . benazepril (LOTENSIN) 20 MG tablet Take 20 mg by mouth daily.      . citalopram (CELEXA) 20 MG tablet Take 20 mg by mouth daily.     . diazepam (VALIUM) 5 MG tablet Take 5 mg by mouth at bedtime as needed. For nerves     . digoxin (LANOXIN) 0.25 MG tablet Take 250 mcg by mouth daily.      . furosemide (LASIX) 20 MG tablet TAKE 1 TABLET DAILY 30 tablet 1  . metFORMIN (GLUMETZA) 1000 MG (MOD) 24 hr tablet Take 1,000 mg by mouth 2 (two) times daily.    . metoprolol tartrate (LOPRESSOR) 25 MG tablet Take 0.5 tablets (12.5 mg total) by mouth 2 (two) times daily. 30 tablet 6  . Multiple Vitamin (MULTIVITAMIN) tablet Take 1 tablet by mouth daily.      . Omega-3 Fatty Acids (FISH OIL) 1200 MG CAPS Take 1 capsule by mouth daily.      Marland Kitchen rOPINIRole (REQUIP) 1 MG tablet Take 1 mg by mouth at bedtime.     . simvastatin (ZOCOR) 40 MG tablet Take 1 tablet (40 mg total) by mouth at bedtime. 7 tablet 0  . tamsulosin (FLOMAX) 0.4 MG CAPS capsule Take 0.4 mg by mouth daily.     No current facility-administered medications for this visit.    History   Social History  . Marital Status: Married    Spouse Name: N/A    Number of Children: 4  . Years of Education: N/A   Occupational History  . RETIRED    Social History Main Topics  . Smoking status: Former Smoker -- 2.00  packs/day for 30 years    Types: Cigarettes    Quit date: 04/26/1980  . Smokeless tobacco: Never Used  . Alcohol Use: Not on file  . Drug Use: Not on file  . Sexual Activity: Not on file   Other Topics Concern  . Not on file   Social History Narrative    Family History  Problem Relation Age of Onset  . Heart attack Mother 42    Past Medical History  Diagnosis Date  . Pneumonia     left lower lobe  . Hx of CABG   . Asbestos exposure   . Dyslipidemia   . Nephrolithiasis   . PVD (peripheral vascular disease)   . Diabetes mellitus   . Renal cyst   . Colitis   . Hypertension   . CAD (coronary artery disease) 2003    last catheterization 2003, potential ischemia in the ramus, grafts patent  ( last nuclear 2002)  . TIA (transient ischemic attack) 2007    ?? medical Rx   . Dizziness     He has had falls related to this / Chronic dizziness  .  Bradycardia     MILD  . Carotid artery disease 2001    BILATERAL 0-39%/  Follow with Dopplers by Dr. Woody Seller  . SOB (shortness of breath)   . Ejection fraction     50%, echo, January, 2012, no mention of significant diastolic dysfunction  . Shortness of breath     May, 2012  . Aortic insufficiency     Mild, echo, January, 2012  //   Mild, echo, February, 2014  . Mitral regurgitation     Mild, echo, February, 2014    Past Surgical History  Procedure Laterality Date  . Coronary artery bypass graft    . Percutaneous transluminal coronary angioplasty      hx    Patient Active Problem List   Diagnosis Date Noted  . Mitral regurgitation   . Aortic insufficiency   . Right knee dislocation 05/26/2011  . Knee dislocation 04/03/2011  . Carotid artery disease   . Ejection fraction   . Shortness of breath   . Dizziness   . Hx of CABG   . Dyslipidemia   . Hypertension   . CAD (coronary artery disease)   . TIA (transient ischemic attack)   . Bradycardia   . DIABETES MELLITUS 12/20/2008  . COLITIS, HX OF 12/20/2008  .  NEPHROLITHIASIS, HX OF 12/20/2008  . ASBESTOS EXPOSURE, HX OF 12/20/2008    ROS   patient denies fever, chills, headache, sweats, rash, change in vision, change in hearing, chest pain, cough, nausea or vomiting, urinary symptoms. All other systems are reviewed and are negative.  PHYSICAL EXAM  Patient is oriented to person time and place. Affect is normal. He is here with his wife. Head is atraumatic. Sclera and conjunctiva are normal. There is no jugulovenous distention. Lungs are clear. Respiratory effort is not labored. Cardiac exam reveals an S1 and S2. Abdomen is soft. There is no peripheral edema.  Filed Vitals:   02/26/14 1308  BP: 116/68  Pulse: 80  Height: 5\' 10"  (1.778 m)  Weight: 180 lb (81.647 kg)  SpO2: 94%     ASSESSMENT & PLAN

## 2014-02-26 NOTE — Assessment & Plan Note (Signed)
The patient's coronary status is stable. He does not need any studies at this time.

## 2014-02-26 NOTE — Patient Instructions (Signed)

## 2014-02-26 NOTE — Assessment & Plan Note (Signed)
He has mild valvular heart disease. He does not need an echo at this time.

## 2014-09-11 ENCOUNTER — Ambulatory Visit (INDEPENDENT_AMBULATORY_CARE_PROVIDER_SITE_OTHER): Payer: Medicare Other | Admitting: Cardiology

## 2014-09-11 ENCOUNTER — Encounter: Payer: Self-pay | Admitting: Cardiology

## 2014-09-11 VITALS — BP 106/66 | HR 62 | Ht 71.0 in | Wt 186.0 lb

## 2014-09-11 DIAGNOSIS — Z951 Presence of aortocoronary bypass graft: Secondary | ICD-10-CM

## 2014-09-11 DIAGNOSIS — I25810 Atherosclerosis of coronary artery bypass graft(s) without angina pectoris: Secondary | ICD-10-CM | POA: Diagnosis not present

## 2014-09-11 DIAGNOSIS — I351 Nonrheumatic aortic (valve) insufficiency: Secondary | ICD-10-CM | POA: Diagnosis not present

## 2014-09-11 DIAGNOSIS — I10 Essential (primary) hypertension: Secondary | ICD-10-CM | POA: Diagnosis not present

## 2014-09-11 MED ORDER — METOPROLOL TARTRATE 25 MG PO TABS: 12.5000 mg | ORAL_TABLET | Freq: Two times a day (BID) | ORAL | Status: DC

## 2014-09-11 NOTE — Progress Notes (Signed)
Cardiology Office Note   Date:  09/11/2014   ID:  [name], DOB November 17, 1926, MRN [id number]  PCP:  Glenda Chroman., MD  Cardiologist:  Dola Argyle, MD   Chief Complaint  Patient presents with  . Appointment    follow-up coronary artery disease      History of Present Illness: [name] is a 81 y.o. male who presents to follow-up coronary artery disease. He is actually stable. I saw him 6 months ago. His wife of 71 years passed away recently. I gave my condolences to the patient and his daughter. His wife quality of life had not been good. He is okay at this point.  He is not having any significant chest pain or shortness of breath.  Patient is aware that I'm retiring at the end of September, 2016. We have decided that he will follow with Dr. Raliegh Ip in the [place] office.    Past Medical History  Diagnosis Date  . Pneumonia     left lower lobe  . Hx of CABG   . Asbestos exposure   . Dyslipidemia   . Nephrolithiasis   . PVD (peripheral vascular disease)   . Diabetes mellitus   . Renal cyst   . Colitis   . Hypertension   . CAD (coronary artery disease) 2003    last catheterization 2003, potential ischemia in the ramus, grafts patent  ( last nuclear 2002)  . TIA (transient ischemic attack) 2007    ?? medical Rx   . Dizziness     He has had falls related to this / Chronic dizziness  . Bradycardia     MILD  . Carotid artery disease 2001    BILATERAL 0-39%/  Follow with Dopplers by Dr. Woody Seller  . SOB (shortness of breath)   . Ejection fraction     50%, echo, January, 2012, no mention of significant diastolic dysfunction  . Shortness of breath     May, 2012  . Aortic insufficiency     Mild, echo, January, 2012  //   Mild, echo, February, 2014  . Mitral regurgitation     Mild, echo, February, 2014    Past Surgical History  Procedure Laterality Date  . Coronary artery bypass graft    . Percutaneous transluminal coronary angioplasty      hx    Patient  Active Problem List   Diagnosis Date Noted  . Mitral regurgitation   . Aortic insufficiency   . Right knee dislocation 05/26/2011  . Knee dislocation 04/03/2011  . Carotid artery disease   . Ejection fraction   . Shortness of breath   . Dizziness   . Hx of CABG   . Dyslipidemia   . Hypertension   . CAD (coronary artery disease) of artery bypass graft   . TIA (transient ischemic attack)   . Bradycardia   . DIABETES MELLITUS 12/20/2008  . COLITIS, HX OF 12/20/2008  . NEPHROLITHIASIS, HX OF 12/20/2008  . ASBESTOS EXPOSURE, HX OF 12/20/2008      Current Outpatient Prescriptions  Medication Sig Dispense Refill  . alfuzosin (UROXATRAL) 10 MG 24 hr tablet Take 10 mg by mouth daily.    Marland Kitchen aspirin EC 81 MG tablet Take 1 tablet (81 mg total) by mouth daily.    . benazepril (LOTENSIN) 20 MG tablet Take 20 mg by mouth daily.      . citalopram (CELEXA) 20 MG tablet Take 20 mg by mouth daily.     . diazepam (VALIUM)  5 MG tablet Take 5 mg by mouth at bedtime as needed. For nerves     . finasteride (PROSCAR) 5 MG tablet Take 5 mg by mouth daily.    . furosemide (LASIX) 20 MG tablet TAKE 1 TABLET DAILY 30 tablet 1  . metFORMIN (GLUCOPHAGE) 500 MG tablet Take 500 mg by mouth 2 (two) times daily.    . metoprolol tartrate (LOPRESSOR) 25 MG tablet Take 0.5 tablets (12.5 mg total) by mouth 2 (two) times daily. 30 tablet 6  . Multiple Vitamin (MULTIVITAMIN) tablet Take 1 tablet by mouth daily.      . Omega-3 Fatty Acids (FISH OIL) 1200 MG CAPS Take 1 capsule by mouth daily.      Marland Kitchen rOPINIRole (REQUIP) 1 MG tablet Take 1 mg by mouth at bedtime.     . simvastatin (ZOCOR) 40 MG tablet Take 1 tablet (40 mg total) by mouth at bedtime. 7 tablet 0   No current facility-administered medications for this visit.    Allergies:   Sulfonamide derivatives and Tetracycline    Social History:  The patient  reports that he quit smoking about 34 years ago. His smoking use included Cigarettes. He has a 60 pack-year  smoking history. He has never used smokeless tobacco.   Family History:  The patient's family history includes Heart attack (age of onset: 28) in his mother.    ROS:  Please see the history of present illness.    Patient denies fever, chills, headache, sweats, rash, change in vision, change in hearing, chest pain, cough, nausea or vomiting, urinary symptoms. All other systems are reviewed and are negative.    PHYSICAL EXAM: VS:  BP 106/66 mmHg  Pulse 62  Ht 5\' 11"  (1.803 m)  Wt 186 lb (84.369 kg)  BMI 25.95 kg/m2  SpO2 97% , He is stable. He is here with his daughter. He's having problems with restless legs. Head is atraumatic. Sclera and conjunctiva are normal. There is no jugulovenous distention. Lungs are clear. Respiratory effort is not labored. Cardiac exam reveals an S1 and S2. Abdomen is soft. There is no peripheral edema. There are no musculoskeletal deformities. There are no skin rashes.  EKG:   EKG is done today and reviewed by me. He has old inferior infarct. There is old nonspecific interventricular conduction delay. There is no change from the past.   Recent Labs: No results found for requested labs within last 365 days.    Lipid Panel No results found for: CHOL, TRIG, HDL, CHOLHDL, VLDL, LDLCALC, LDLDIRECT    Wt Readings from Last 3 Encounters:  09/11/14 186 lb (84.369 kg)  02/26/14 180 lb (81.647 kg)  09/20/13 174 lb 1.9 oz (78.98 kg)      Current medicines are reviewed  The patient understands his medications.     ASSESSMENT AND PLAN:

## 2014-09-11 NOTE — Assessment & Plan Note (Signed)
He has mild valvular heart disease that was stable by echo in 2014. He does not need a follow-up echo at this time.

## 2014-09-11 NOTE — Assessment & Plan Note (Signed)
Blood pressures controlled. No change in therapy. 

## 2014-09-11 NOTE — Patient Instructions (Signed)
Your physician recommends that you continue on your current medications as directed. Please refer to the Current Medication list given to you today. Your physician recommends that you schedule a follow-up appointment in: 6 months with Dr. Bronson Ing. You will receive a reminder letter in the mail in about 4 months reminding you to call and schedule your appointment. If you don't receive this letter, please contact our office.

## 2014-09-11 NOTE — Assessment & Plan Note (Signed)
He underwent CABG many many years ago. His last cath was 2003. Grafts were patent. It was possible that he could have some ischemia in the ramus. He is not having any significant symptoms. He is on appropriate medications. No further workup.

## 2014-09-16 ENCOUNTER — Ambulatory Visit: Payer: Medicare Other | Admitting: Cardiology

## 2015-03-10 ENCOUNTER — Other Ambulatory Visit: Payer: Self-pay | Admitting: Cardiology

## 2015-03-24 ENCOUNTER — Encounter: Payer: Self-pay | Admitting: Cardiovascular Disease

## 2015-03-24 ENCOUNTER — Ambulatory Visit (INDEPENDENT_AMBULATORY_CARE_PROVIDER_SITE_OTHER): Payer: Medicare Other | Admitting: Cardiovascular Disease

## 2015-03-24 VITALS — BP 120/62 | HR 75 | Ht 71.0 in | Wt 199.0 lb

## 2015-03-24 DIAGNOSIS — E785 Hyperlipidemia, unspecified: Secondary | ICD-10-CM

## 2015-03-24 DIAGNOSIS — I25810 Atherosclerosis of coronary artery bypass graft(s) without angina pectoris: Secondary | ICD-10-CM | POA: Diagnosis not present

## 2015-03-24 DIAGNOSIS — I10 Essential (primary) hypertension: Secondary | ICD-10-CM | POA: Diagnosis not present

## 2015-03-24 DIAGNOSIS — Z951 Presence of aortocoronary bypass graft: Secondary | ICD-10-CM

## 2015-03-24 NOTE — Patient Instructions (Signed)
Continue all current medications. Your physician wants you to follow up in: 6 months.  You will receive a reminder letter in the mail one-two months in advance.  If you don't receive a letter, please call our office to schedule the follow up appointment   

## 2015-03-24 NOTE — Progress Notes (Signed)
Patient ID: [name], male   DOB: 1926/06/01, 81 y.o.   MRN: [id number]      SUBJECTIVE: The patient is an 81 yr old male with a medical history significant for CABG several years ago. His last cath was in 2003 and demonstrated patent grafts.  He is a former patient of Dr. Ron Parker and has seen him since 1982. This is my first time meeting him. His wife of 22 years passed away earlier this year. He is here with his daughter.  He denies exertional angina. He has mild exertional dyspnea which has not gotten any worse. He continues to mow his own lawn and do yardwork for up to 12-14 hours daily without any difficulties. He has vertigo and chronic dizziness but denies syncope. He has had a few falls.  Review of Systems: As per "subjective", otherwise negative.  Allergies  Allergen Reactions  . Sulfonamide Derivatives   . Tetracycline     Current Outpatient Prescriptions  Medication Sig Dispense Refill  . alfuzosin (UROXATRAL) 10 MG 24 hr tablet Take 10 mg by mouth daily.    Marland Kitchen aspirin EC 81 MG tablet Take 1 tablet (81 mg total) by mouth daily.    . benazepril (LOTENSIN) 20 MG tablet Take 20 mg by mouth daily.      . citalopram (CELEXA) 20 MG tablet Take 20 mg by mouth daily.     . diazepam (VALIUM) 5 MG tablet Take 5 mg by mouth at bedtime as needed. For nerves     . finasteride (PROSCAR) 5 MG tablet Take 5 mg by mouth daily.    . furosemide (LASIX) 20 MG tablet TAKE 1 TABLET DAILY 30 tablet 1  . metFORMIN (GLUCOPHAGE) 500 MG tablet Take 500 mg by mouth daily with breakfast.     . metoprolol tartrate (LOPRESSOR) 25 MG tablet TAKE 1/2 TABLET BY MOUTH TWICE DAILY 30 tablet 1  . Multiple Vitamin (MULTIVITAMIN) tablet Take 1 tablet by mouth daily.      . Omega-3 Fatty Acids (FISH OIL) 1200 MG CAPS Take 1 capsule by mouth daily.      Marland Kitchen rOPINIRole (REQUIP) 1 MG tablet Take 1 mg by mouth at bedtime. Take 1 tab in AM and 2 PM    . simvastatin (ZOCOR) 40 MG tablet Take 1 tablet (40 mg total) by  mouth at bedtime. 7 tablet 0   No current facility-administered medications for this visit.    Past Medical History  Diagnosis Date  . Pneumonia     left lower lobe  . Hx of CABG   . Asbestos exposure   . Dyslipidemia   . Nephrolithiasis   . PVD (peripheral vascular disease) ([place])   . Diabetes mellitus   . Renal cyst   . Colitis   . Hypertension   . CAD (coronary artery disease) 2003    last catheterization 2003, potential ischemia in the ramus, grafts patent  ( last nuclear 2002)  . TIA (transient ischemic attack) 2007    ?? medical Rx   . Dizziness     He has had falls related to this / Chronic dizziness  . Bradycardia     MILD  . Carotid artery disease ([place]) 2001    BILATERAL 0-39%/  Follow with Dopplers by Dr. Woody Seller  . SOB (shortness of breath)   . Ejection fraction     50%, echo, January, 2012, no mention of significant diastolic dysfunction  . Shortness of breath     May, 2012  .  Aortic insufficiency     Mild, echo, January, 2012  //   Mild, echo, February, 2014  . Mitral regurgitation     Mild, echo, February, 2014    Past Surgical History  Procedure Laterality Date  . Coronary artery bypass graft    . Percutaneous transluminal coronary angioplasty      hx    Social History   Social History  . Marital Status: Married    Spouse Name: N/A  . Number of Children: 4  . Years of Education: N/A   Occupational History  . RETIRED    Social History Main Topics  . Smoking status: Former Smoker -- 2.00 packs/day for 30 years    Types: Cigarettes    Quit date: 04/26/1980  . Smokeless tobacco: Never Used  . Alcohol Use: Not on file  . Drug Use: Not on file  . Sexual Activity: Not on file   Other Topics Concern  . Not on file   Social History Narrative     Filed Vitals:   03/24/15 1406  BP: 120/62  Pulse: 75  Height: 5\' 11"  (1.803 m)  Weight: 199 lb (90.266 kg)  SpO2: 98%    PHYSICAL EXAM General: NAD HEENT: Normal. Neck: No JVD, no  thyromegaly. Lungs: Clear to auscultation bilaterally with normal respiratory effort. CV: Nondisplaced PMI.  Regular rate and rhythm, normal S1/S2, no S3/S4, no murmur. Trace periankle edema b/l.  No carotid bruit.  Abdomen: Soft, nontender, no distention.  Neurologic: Alert and oriented x 3.  Psych: Normal affect. Skin: Normal. Musculoskeletal: No gross deformities. Extremities: No clubbing or cyanosis.   ECG: Most recent ECG reviewed.      ASSESSMENT AND PLAN: 1. CAD/CABG: Stable ischemic heart disease. Continue ASA, metoprolol, and statin.  2. Essential HTN: Controlled. No changes.  Dispo: f/u 6 months.   Kate Sable, M.D., F.A.C.C.

## 2015-04-09 DIAGNOSIS — I10 Essential (primary) hypertension: Secondary | ICD-10-CM | POA: Diagnosis not present

## 2015-04-09 DIAGNOSIS — Z87828 Personal history of other (healed) physical injury and trauma: Secondary | ICD-10-CM | POA: Diagnosis not present

## 2015-04-09 DIAGNOSIS — I252 Old myocardial infarction: Secondary | ICD-10-CM | POA: Diagnosis not present

## 2015-04-09 DIAGNOSIS — Z7984 Long term (current) use of oral hypoglycemic drugs: Secondary | ICD-10-CM | POA: Diagnosis not present

## 2015-04-09 DIAGNOSIS — K219 Gastro-esophageal reflux disease without esophagitis: Secondary | ICD-10-CM | POA: Diagnosis not present

## 2015-04-09 DIAGNOSIS — Z8673 Personal history of transient ischemic attack (TIA), and cerebral infarction without residual deficits: Secondary | ICD-10-CM | POA: Diagnosis not present

## 2015-04-09 DIAGNOSIS — I2510 Atherosclerotic heart disease of native coronary artery without angina pectoris: Secondary | ICD-10-CM | POA: Diagnosis not present

## 2015-04-09 DIAGNOSIS — Z951 Presence of aortocoronary bypass graft: Secondary | ICD-10-CM | POA: Diagnosis not present

## 2015-04-09 DIAGNOSIS — I509 Heart failure, unspecified: Secondary | ICD-10-CM | POA: Diagnosis not present

## 2015-04-09 DIAGNOSIS — E119 Type 2 diabetes mellitus without complications: Secondary | ICD-10-CM | POA: Diagnosis not present

## 2015-04-09 DIAGNOSIS — E785 Hyperlipidemia, unspecified: Secondary | ICD-10-CM | POA: Diagnosis not present

## 2015-04-09 DIAGNOSIS — J189 Pneumonia, unspecified organism: Secondary | ICD-10-CM | POA: Diagnosis not present

## 2015-04-11 DIAGNOSIS — E119 Type 2 diabetes mellitus without complications: Secondary | ICD-10-CM | POA: Diagnosis not present

## 2015-04-11 DIAGNOSIS — J189 Pneumonia, unspecified organism: Secondary | ICD-10-CM | POA: Diagnosis not present

## 2015-04-11 DIAGNOSIS — I10 Essential (primary) hypertension: Secondary | ICD-10-CM | POA: Diagnosis not present

## 2015-04-11 DIAGNOSIS — I2510 Atherosclerotic heart disease of native coronary artery without angina pectoris: Secondary | ICD-10-CM | POA: Diagnosis not present

## 2015-04-11 DIAGNOSIS — I509 Heart failure, unspecified: Secondary | ICD-10-CM | POA: Diagnosis not present

## 2015-04-11 DIAGNOSIS — I252 Old myocardial infarction: Secondary | ICD-10-CM | POA: Diagnosis not present

## 2015-04-16 DIAGNOSIS — I509 Heart failure, unspecified: Secondary | ICD-10-CM | POA: Diagnosis not present

## 2015-04-16 DIAGNOSIS — I252 Old myocardial infarction: Secondary | ICD-10-CM | POA: Diagnosis not present

## 2015-04-16 DIAGNOSIS — J189 Pneumonia, unspecified organism: Secondary | ICD-10-CM | POA: Diagnosis not present

## 2015-04-16 DIAGNOSIS — E119 Type 2 diabetes mellitus without complications: Secondary | ICD-10-CM | POA: Diagnosis not present

## 2015-04-16 DIAGNOSIS — I10 Essential (primary) hypertension: Secondary | ICD-10-CM | POA: Diagnosis not present

## 2015-04-16 DIAGNOSIS — I2510 Atherosclerotic heart disease of native coronary artery without angina pectoris: Secondary | ICD-10-CM | POA: Diagnosis not present

## 2015-05-01 DIAGNOSIS — I252 Old myocardial infarction: Secondary | ICD-10-CM | POA: Diagnosis not present

## 2015-05-01 DIAGNOSIS — I10 Essential (primary) hypertension: Secondary | ICD-10-CM | POA: Diagnosis not present

## 2015-05-01 DIAGNOSIS — I509 Heart failure, unspecified: Secondary | ICD-10-CM | POA: Diagnosis not present

## 2015-05-01 DIAGNOSIS — I2510 Atherosclerotic heart disease of native coronary artery without angina pectoris: Secondary | ICD-10-CM | POA: Diagnosis not present

## 2015-05-01 DIAGNOSIS — J189 Pneumonia, unspecified organism: Secondary | ICD-10-CM | POA: Diagnosis not present

## 2015-05-01 DIAGNOSIS — E119 Type 2 diabetes mellitus without complications: Secondary | ICD-10-CM | POA: Diagnosis not present

## 2015-05-05 DIAGNOSIS — E119 Type 2 diabetes mellitus without complications: Secondary | ICD-10-CM | POA: Diagnosis not present

## 2015-05-05 DIAGNOSIS — I252 Old myocardial infarction: Secondary | ICD-10-CM | POA: Diagnosis not present

## 2015-05-05 DIAGNOSIS — I509 Heart failure, unspecified: Secondary | ICD-10-CM | POA: Diagnosis not present

## 2015-05-05 DIAGNOSIS — J189 Pneumonia, unspecified organism: Secondary | ICD-10-CM | POA: Diagnosis not present

## 2015-05-05 DIAGNOSIS — I2510 Atherosclerotic heart disease of native coronary artery without angina pectoris: Secondary | ICD-10-CM | POA: Diagnosis not present

## 2015-05-05 DIAGNOSIS — I10 Essential (primary) hypertension: Secondary | ICD-10-CM | POA: Diagnosis not present

## 2015-05-07 ENCOUNTER — Other Ambulatory Visit: Payer: Self-pay | Admitting: Cardiology

## 2015-05-15 DIAGNOSIS — I2510 Atherosclerotic heart disease of native coronary artery without angina pectoris: Secondary | ICD-10-CM | POA: Diagnosis not present

## 2015-05-15 DIAGNOSIS — I10 Essential (primary) hypertension: Secondary | ICD-10-CM | POA: Diagnosis not present

## 2015-05-15 DIAGNOSIS — I252 Old myocardial infarction: Secondary | ICD-10-CM | POA: Diagnosis not present

## 2015-05-15 DIAGNOSIS — J189 Pneumonia, unspecified organism: Secondary | ICD-10-CM | POA: Diagnosis not present

## 2015-05-15 DIAGNOSIS — I509 Heart failure, unspecified: Secondary | ICD-10-CM | POA: Diagnosis not present

## 2015-05-15 DIAGNOSIS — E119 Type 2 diabetes mellitus without complications: Secondary | ICD-10-CM | POA: Diagnosis not present

## 2015-05-16 DIAGNOSIS — L609 Nail disorder, unspecified: Secondary | ICD-10-CM | POA: Diagnosis not present

## 2015-05-16 DIAGNOSIS — E1142 Type 2 diabetes mellitus with diabetic polyneuropathy: Secondary | ICD-10-CM | POA: Diagnosis not present

## 2015-05-19 DIAGNOSIS — E119 Type 2 diabetes mellitus without complications: Secondary | ICD-10-CM | POA: Diagnosis not present

## 2015-05-19 DIAGNOSIS — I2510 Atherosclerotic heart disease of native coronary artery without angina pectoris: Secondary | ICD-10-CM | POA: Diagnosis not present

## 2015-05-19 DIAGNOSIS — J189 Pneumonia, unspecified organism: Secondary | ICD-10-CM | POA: Diagnosis not present

## 2015-05-19 DIAGNOSIS — I509 Heart failure, unspecified: Secondary | ICD-10-CM | POA: Diagnosis not present

## 2015-05-19 DIAGNOSIS — I10 Essential (primary) hypertension: Secondary | ICD-10-CM | POA: Diagnosis not present

## 2015-05-19 DIAGNOSIS — I252 Old myocardial infarction: Secondary | ICD-10-CM | POA: Diagnosis not present

## 2015-05-28 DIAGNOSIS — I252 Old myocardial infarction: Secondary | ICD-10-CM | POA: Diagnosis not present

## 2015-05-28 DIAGNOSIS — I10 Essential (primary) hypertension: Secondary | ICD-10-CM | POA: Diagnosis not present

## 2015-05-28 DIAGNOSIS — I2510 Atherosclerotic heart disease of native coronary artery without angina pectoris: Secondary | ICD-10-CM | POA: Diagnosis not present

## 2015-05-28 DIAGNOSIS — E119 Type 2 diabetes mellitus without complications: Secondary | ICD-10-CM | POA: Diagnosis not present

## 2015-05-28 DIAGNOSIS — I509 Heart failure, unspecified: Secondary | ICD-10-CM | POA: Diagnosis not present

## 2015-05-28 DIAGNOSIS — J189 Pneumonia, unspecified organism: Secondary | ICD-10-CM | POA: Diagnosis not present

## 2015-06-02 DIAGNOSIS — I252 Old myocardial infarction: Secondary | ICD-10-CM | POA: Diagnosis not present

## 2015-06-02 DIAGNOSIS — I509 Heart failure, unspecified: Secondary | ICD-10-CM | POA: Diagnosis not present

## 2015-06-02 DIAGNOSIS — I2510 Atherosclerotic heart disease of native coronary artery without angina pectoris: Secondary | ICD-10-CM | POA: Diagnosis not present

## 2015-06-02 DIAGNOSIS — J189 Pneumonia, unspecified organism: Secondary | ICD-10-CM | POA: Diagnosis not present

## 2015-06-02 DIAGNOSIS — E119 Type 2 diabetes mellitus without complications: Secondary | ICD-10-CM | POA: Diagnosis not present

## 2015-06-02 DIAGNOSIS — I10 Essential (primary) hypertension: Secondary | ICD-10-CM | POA: Diagnosis not present

## 2015-06-11 DIAGNOSIS — Z85820 Personal history of malignant melanoma of skin: Secondary | ICD-10-CM | POA: Diagnosis not present

## 2015-06-11 DIAGNOSIS — L570 Actinic keratosis: Secondary | ICD-10-CM | POA: Diagnosis not present

## 2015-06-11 DIAGNOSIS — C44319 Basal cell carcinoma of skin of other parts of face: Secondary | ICD-10-CM | POA: Diagnosis not present

## 2015-06-11 DIAGNOSIS — D485 Neoplasm of uncertain behavior of skin: Secondary | ICD-10-CM | POA: Diagnosis not present

## 2015-06-11 DIAGNOSIS — Z85828 Personal history of other malignant neoplasm of skin: Secondary | ICD-10-CM | POA: Diagnosis not present

## 2015-07-03 DIAGNOSIS — C44319 Basal cell carcinoma of skin of other parts of face: Secondary | ICD-10-CM | POA: Diagnosis not present

## 2015-07-07 ENCOUNTER — Other Ambulatory Visit: Payer: Self-pay | Admitting: *Deleted

## 2015-07-07 MED ORDER — METOPROLOL TARTRATE 25 MG PO TABS: 12.5000 mg | ORAL_TABLET | Freq: Two times a day (BID) | ORAL | Status: DC

## 2015-07-10 DIAGNOSIS — E7800 Pure hypercholesterolemia, unspecified: Secondary | ICD-10-CM | POA: Diagnosis not present

## 2015-07-10 DIAGNOSIS — I2510 Atherosclerotic heart disease of native coronary artery without angina pectoris: Secondary | ICD-10-CM | POA: Diagnosis not present

## 2015-07-10 DIAGNOSIS — I10 Essential (primary) hypertension: Secondary | ICD-10-CM | POA: Diagnosis not present

## 2015-07-10 DIAGNOSIS — E119 Type 2 diabetes mellitus without complications: Secondary | ICD-10-CM | POA: Diagnosis not present

## 2015-07-15 DIAGNOSIS — L509 Urticaria, unspecified: Secondary | ICD-10-CM | POA: Diagnosis not present

## 2015-07-15 DIAGNOSIS — D485 Neoplasm of uncertain behavior of skin: Secondary | ICD-10-CM | POA: Diagnosis not present

## 2015-07-25 DIAGNOSIS — L609 Nail disorder, unspecified: Secondary | ICD-10-CM | POA: Diagnosis not present

## 2015-07-25 DIAGNOSIS — E1142 Type 2 diabetes mellitus with diabetic polyneuropathy: Secondary | ICD-10-CM | POA: Diagnosis not present

## 2015-07-30 DIAGNOSIS — W010XXA Fall on same level from slipping, tripping and stumbling without subsequent striking against object, initial encounter: Secondary | ICD-10-CM | POA: Diagnosis not present

## 2015-07-30 DIAGNOSIS — S0990XA Unspecified injury of head, initial encounter: Secondary | ICD-10-CM | POA: Diagnosis not present

## 2015-07-30 DIAGNOSIS — S199XXA Unspecified injury of neck, initial encounter: Secondary | ICD-10-CM | POA: Diagnosis not present

## 2015-07-30 DIAGNOSIS — E7800 Pure hypercholesterolemia, unspecified: Secondary | ICD-10-CM | POA: Diagnosis not present

## 2015-07-30 DIAGNOSIS — I2510 Atherosclerotic heart disease of native coronary artery without angina pectoris: Secondary | ICD-10-CM | POA: Diagnosis not present

## 2015-07-30 DIAGNOSIS — I10 Essential (primary) hypertension: Secondary | ICD-10-CM | POA: Diagnosis not present

## 2015-07-30 DIAGNOSIS — Z882 Allergy status to sulfonamides status: Secondary | ICD-10-CM | POA: Diagnosis not present

## 2015-07-30 DIAGNOSIS — R05 Cough: Secondary | ICD-10-CM | POA: Diagnosis not present

## 2015-07-30 DIAGNOSIS — K219 Gastro-esophageal reflux disease without esophagitis: Secondary | ICD-10-CM | POA: Diagnosis not present

## 2015-07-30 DIAGNOSIS — S42212A Unspecified displaced fracture of surgical neck of left humerus, initial encounter for closed fracture: Secondary | ICD-10-CM | POA: Diagnosis not present

## 2015-07-30 DIAGNOSIS — R0902 Hypoxemia: Secondary | ICD-10-CM | POA: Diagnosis not present

## 2015-07-30 DIAGNOSIS — M25512 Pain in left shoulder: Secondary | ICD-10-CM | POA: Diagnosis not present

## 2015-07-30 DIAGNOSIS — Z951 Presence of aortocoronary bypass graft: Secondary | ICD-10-CM | POA: Diagnosis not present

## 2015-07-30 DIAGNOSIS — E119 Type 2 diabetes mellitus without complications: Secondary | ICD-10-CM | POA: Diagnosis not present

## 2015-07-30 DIAGNOSIS — S42202A Unspecified fracture of upper end of left humerus, initial encounter for closed fracture: Secondary | ICD-10-CM | POA: Diagnosis not present

## 2015-07-31 DIAGNOSIS — S42209A Unspecified fracture of upper end of unspecified humerus, initial encounter for closed fracture: Secondary | ICD-10-CM | POA: Diagnosis not present

## 2015-07-31 DIAGNOSIS — S199XXA Unspecified injury of neck, initial encounter: Secondary | ICD-10-CM | POA: Diagnosis not present

## 2015-07-31 DIAGNOSIS — I10 Essential (primary) hypertension: Secondary | ICD-10-CM | POA: Diagnosis not present

## 2015-07-31 DIAGNOSIS — M25512 Pain in left shoulder: Secondary | ICD-10-CM | POA: Diagnosis not present

## 2015-07-31 DIAGNOSIS — W182XXA Fall in (into) shower or empty bathtub, initial encounter: Secondary | ICD-10-CM | POA: Diagnosis not present

## 2015-07-31 DIAGNOSIS — S0990XA Unspecified injury of head, initial encounter: Secondary | ICD-10-CM | POA: Diagnosis not present

## 2015-07-31 DIAGNOSIS — S42202A Unspecified fracture of upper end of left humerus, initial encounter for closed fracture: Secondary | ICD-10-CM | POA: Diagnosis not present

## 2015-07-31 DIAGNOSIS — S42212A Unspecified displaced fracture of surgical neck of left humerus, initial encounter for closed fracture: Secondary | ICD-10-CM | POA: Diagnosis not present

## 2015-07-31 DIAGNOSIS — R05 Cough: Secondary | ICD-10-CM | POA: Diagnosis not present

## 2015-08-01 DIAGNOSIS — E119 Type 2 diabetes mellitus without complications: Secondary | ICD-10-CM | POA: Diagnosis not present

## 2015-08-01 DIAGNOSIS — I10 Essential (primary) hypertension: Secondary | ICD-10-CM | POA: Diagnosis not present

## 2015-08-07 DIAGNOSIS — S42292A Other displaced fracture of upper end of left humerus, initial encounter for closed fracture: Secondary | ICD-10-CM | POA: Diagnosis not present

## 2015-08-07 DIAGNOSIS — Z299 Encounter for prophylactic measures, unspecified: Secondary | ICD-10-CM | POA: Diagnosis not present

## 2015-08-07 DIAGNOSIS — E1165 Type 2 diabetes mellitus with hyperglycemia: Secondary | ICD-10-CM | POA: Diagnosis not present

## 2015-08-07 DIAGNOSIS — R600 Localized edema: Secondary | ICD-10-CM | POA: Diagnosis not present

## 2015-08-14 DIAGNOSIS — R42 Dizziness and giddiness: Secondary | ICD-10-CM | POA: Diagnosis not present

## 2015-08-14 DIAGNOSIS — R5383 Other fatigue: Secondary | ICD-10-CM | POA: Diagnosis not present

## 2015-08-14 DIAGNOSIS — Z789 Other specified health status: Secondary | ICD-10-CM | POA: Diagnosis not present

## 2015-08-14 DIAGNOSIS — S42209A Unspecified fracture of upper end of unspecified humerus, initial encounter for closed fracture: Secondary | ICD-10-CM | POA: Diagnosis not present

## 2015-08-14 DIAGNOSIS — E1165 Type 2 diabetes mellitus with hyperglycemia: Secondary | ICD-10-CM | POA: Diagnosis not present

## 2015-08-14 DIAGNOSIS — K219 Gastro-esophageal reflux disease without esophagitis: Secondary | ICD-10-CM | POA: Diagnosis not present

## 2015-08-15 DIAGNOSIS — E119 Type 2 diabetes mellitus without complications: Secondary | ICD-10-CM | POA: Diagnosis not present

## 2015-08-15 DIAGNOSIS — Z951 Presence of aortocoronary bypass graft: Secondary | ICD-10-CM | POA: Diagnosis not present

## 2015-08-15 DIAGNOSIS — Y93E1 Activity, personal bathing and showering: Secondary | ICD-10-CM | POA: Diagnosis not present

## 2015-08-15 DIAGNOSIS — G2581 Restless legs syndrome: Secondary | ICD-10-CM | POA: Diagnosis not present

## 2015-08-15 DIAGNOSIS — W182XXA Fall in (into) shower or empty bathtub, initial encounter: Secondary | ICD-10-CM | POA: Diagnosis not present

## 2015-08-15 DIAGNOSIS — E7800 Pure hypercholesterolemia, unspecified: Secondary | ICD-10-CM | POA: Diagnosis not present

## 2015-08-15 DIAGNOSIS — Z8673 Personal history of transient ischemic attack (TIA), and cerebral infarction without residual deficits: Secondary | ICD-10-CM | POA: Diagnosis not present

## 2015-08-15 DIAGNOSIS — I10 Essential (primary) hypertension: Secondary | ICD-10-CM | POA: Diagnosis not present

## 2015-08-15 DIAGNOSIS — Z881 Allergy status to other antibiotic agents status: Secondary | ICD-10-CM | POA: Diagnosis not present

## 2015-08-15 DIAGNOSIS — F419 Anxiety disorder, unspecified: Secondary | ICD-10-CM | POA: Diagnosis not present

## 2015-08-15 DIAGNOSIS — S42292A Other displaced fracture of upper end of left humerus, initial encounter for closed fracture: Secondary | ICD-10-CM | POA: Diagnosis not present

## 2015-08-15 DIAGNOSIS — Z7982 Long term (current) use of aspirin: Secondary | ICD-10-CM | POA: Diagnosis not present

## 2015-08-15 DIAGNOSIS — I2510 Atherosclerotic heart disease of native coronary artery without angina pectoris: Secondary | ICD-10-CM | POA: Diagnosis not present

## 2015-08-15 DIAGNOSIS — Z79899 Other long term (current) drug therapy: Secondary | ICD-10-CM | POA: Diagnosis not present

## 2015-08-15 DIAGNOSIS — S42212A Unspecified displaced fracture of surgical neck of left humerus, initial encounter for closed fracture: Secondary | ICD-10-CM | POA: Diagnosis not present

## 2015-08-15 DIAGNOSIS — S42295A Other nondisplaced fracture of upper end of left humerus, initial encounter for closed fracture: Secondary | ICD-10-CM | POA: Diagnosis not present

## 2015-08-15 DIAGNOSIS — Z882 Allergy status to sulfonamides status: Secondary | ICD-10-CM | POA: Diagnosis not present

## 2015-08-15 DIAGNOSIS — Z7984 Long term (current) use of oral hypoglycemic drugs: Secondary | ICD-10-CM | POA: Diagnosis not present

## 2015-08-15 DIAGNOSIS — Z8701 Personal history of pneumonia (recurrent): Secondary | ICD-10-CM | POA: Diagnosis not present

## 2015-08-15 DIAGNOSIS — S42202A Unspecified fracture of upper end of left humerus, initial encounter for closed fracture: Secondary | ICD-10-CM | POA: Diagnosis not present

## 2015-08-15 DIAGNOSIS — I252 Old myocardial infarction: Secondary | ICD-10-CM | POA: Diagnosis not present

## 2015-08-25 DIAGNOSIS — Z9889 Other specified postprocedural states: Secondary | ICD-10-CM | POA: Diagnosis not present

## 2015-08-25 DIAGNOSIS — S42209A Unspecified fracture of upper end of unspecified humerus, initial encounter for closed fracture: Secondary | ICD-10-CM | POA: Diagnosis not present

## 2015-09-19 DIAGNOSIS — I2510 Atherosclerotic heart disease of native coronary artery without angina pectoris: Secondary | ICD-10-CM | POA: Diagnosis not present

## 2015-09-19 DIAGNOSIS — E119 Type 2 diabetes mellitus without complications: Secondary | ICD-10-CM | POA: Diagnosis not present

## 2015-09-19 DIAGNOSIS — E7800 Pure hypercholesterolemia, unspecified: Secondary | ICD-10-CM | POA: Diagnosis not present

## 2015-09-19 DIAGNOSIS — I10 Essential (primary) hypertension: Secondary | ICD-10-CM | POA: Diagnosis not present

## 2015-09-24 ENCOUNTER — Encounter: Payer: Self-pay | Admitting: *Deleted

## 2015-09-24 ENCOUNTER — Encounter: Payer: Self-pay | Admitting: Cardiovascular Disease

## 2015-09-24 ENCOUNTER — Ambulatory Visit (INDEPENDENT_AMBULATORY_CARE_PROVIDER_SITE_OTHER): Payer: Medicare Other | Admitting: Cardiovascular Disease

## 2015-09-24 VITALS — BP 132/72 | HR 76 | Ht 71.0 in | Wt 202.0 lb

## 2015-09-24 DIAGNOSIS — I25810 Atherosclerosis of coronary artery bypass graft(s) without angina pectoris: Secondary | ICD-10-CM

## 2015-09-24 DIAGNOSIS — I10 Essential (primary) hypertension: Secondary | ICD-10-CM

## 2015-09-24 DIAGNOSIS — Z951 Presence of aortocoronary bypass graft: Secondary | ICD-10-CM | POA: Diagnosis not present

## 2015-09-24 DIAGNOSIS — E785 Hyperlipidemia, unspecified: Secondary | ICD-10-CM | POA: Diagnosis not present

## 2015-09-24 DIAGNOSIS — I5032 Chronic diastolic (congestive) heart failure: Secondary | ICD-10-CM

## 2015-09-24 NOTE — Patient Instructions (Signed)
Continue all current medications. Your physician wants you to follow up in: 6 months.  You will receive a reminder letter in the mail one-two months in advance.  If you don't receive a letter, please call our office to schedule the follow up appointment   

## 2015-09-24 NOTE — Progress Notes (Signed)
Patient ID: [name], male   DOB: 10/01/1926, 82 y.o.   MRN: [id number]      SUBJECTIVE: The patient is an 82 yr old male with a medical history significant for CABG several years ago. His last cath was in 2001/07/28 and demonstrated patent grafts.  His wife of 93 years passed away in 2014-07-29. He is here with his daughter.  He denies exertional angina. He has vertigo and chronic dizziness but denies syncope.  He had a fall earlier this year and broke his left arm and required surgery. This was due to imbalance and a fall in the shower. He was reportedly hospitalized for pneumonia at [facility] in December 2016. He has bilateral leg edema and Lasix was increased by PCP to 40 mg every morning and 20 mg every evening.   Review of Systems: As per "subjective", otherwise negative.  Allergies  Allergen Reactions  . Sulfonamide Derivatives   . Tetracycline     Current Outpatient Prescriptions  Medication Sig Dispense Refill  . alfuzosin (UROXATRAL) 10 MG 24 hr tablet Take 10 mg by mouth daily.    Marland Kitchen aspirin EC 81 MG tablet Take 1 tablet (81 mg total) by mouth daily.    . benazepril (LOTENSIN) 20 MG tablet Take 20 mg by mouth daily.      . citalopram (CELEXA) 20 MG tablet Take 20 mg by mouth daily.     . diazepam (VALIUM) 5 MG tablet Take 5 mg by mouth at bedtime as needed. For nerves     . finasteride (PROSCAR) 5 MG tablet Take 5 mg by mouth daily.    . furosemide (LASIX) 40 MG tablet Take 40 mg by mouth. Take 40 mg in morning and 20 mg in evening    . metFORMIN (GLUCOPHAGE) 500 MG tablet Take 500 mg by mouth daily with breakfast.     . metoprolol tartrate (LOPRESSOR) 25 MG tablet Take 0.5 tablets (12.5 mg total) by mouth 2 (two) times daily. 30 tablet 6  . Multiple Vitamin (MULTIVITAMIN) tablet Take 1 tablet by mouth daily.      . Omega-3 Fatty Acids (FISH OIL) 1200 MG CAPS Take 1 capsule by mouth daily.      Marland Kitchen rOPINIRole (REQUIP) 1 MG tablet Take 1 mg by mouth at bedtime. Take 1 tab in AM  and 2 PM    . simvastatin (ZOCOR) 40 MG tablet Take 1 tablet (40 mg total) by mouth at bedtime. 7 tablet 0   No current facility-administered medications for this visit.    Past Medical History  Diagnosis Date  . Pneumonia     left lower lobe  . Hx of CABG   . Asbestos exposure   . Dyslipidemia   . Nephrolithiasis   . PVD (peripheral vascular disease) ([place])   . Diabetes mellitus   . Renal cyst   . Colitis   . Hypertension   . CAD (coronary artery disease) 28-Jul-2001    last catheterization July 28, 2001, potential ischemia in the ramus, grafts patent  ( last nuclear 2000/07/28)  . TIA (transient ischemic attack) 07/28/05    ?? medical Rx   . Dizziness     He has had falls related to this / Chronic dizziness  . Bradycardia     MILD  . Carotid artery disease ([place]) 1999/07/29    BILATERAL 0-39%/  Follow with Dopplers by Dr. Woody Seller  . SOB (shortness of breath)   . Ejection fraction     50%, echo, January, 2012, no  mention of significant diastolic dysfunction  . Shortness of breath     May, 2012  . Aortic insufficiency     Mild, echo, January, 2012  //   Mild, echo, February, 2014  . Mitral regurgitation     Mild, echo, February, 2014    Past Surgical History  Procedure Laterality Date  . Coronary artery bypass graft    . Percutaneous transluminal coronary angioplasty      hx    Social History   Social History  . Marital Status: Married    Spouse Name: N/A  . Number of Children: 4  . Years of Education: N/A   Occupational History  . RETIRED    Social History Main Topics  . Smoking status: Former Smoker -- 2.00 packs/day for 30 years    Types: Cigarettes    Quit date: 04/26/1980  . Smokeless tobacco: Never Used  . Alcohol Use: Not on file  . Drug Use: Not on file  . Sexual Activity: Not on file   Other Topics Concern  . Not on file   Social History Narrative     Filed Vitals:   09/24/15 1301  BP: 132/72  Pulse: 76  Height: 5\' 11"  (1.803 m)  Weight: 202 lb (91.627 kg)  SpO2:  97%    PHYSICAL EXAM General: NAD HEENT: Normal. Neck: No JVD, no thyromegaly. Lungs: Clear to auscultation bilaterally with normal respiratory effort. CV: Nondisplaced PMI. Regular rate and rhythm, normal S1/S2, no S3/S4, no murmur. 1+ pitting pretibial  edema b/l.  Abdomen: Soft, nontender, no distention.  Neurologic: Alert and oriented x 3.  Psych: Normal affect. Skin: Normal. Musculoskeletal: No gross deformities. Extremities: No clubbing or cyanosis.    ECG: Most recent ECG reviewed.      ASSESSMENT AND PLAN: 1. CAD/CABG: Stable ischemic heart disease. Continue ASA, metoprolol, and statin.  2. Essential HTN: Controlled. No changes.  3. Chronic diastolic heart failure: Would recommend continuing Lasix 40 mg q am and 20 mg q pm for now. Will see if any cardiac testing was done at [facility] in 03/2015.  Dispo: f/u 6 months.   Kate Sable, M.D., F.A.C.C.

## 2015-09-25 DIAGNOSIS — S42209A Unspecified fracture of upper end of unspecified humerus, initial encounter for closed fracture: Secondary | ICD-10-CM | POA: Diagnosis not present

## 2015-09-29 DIAGNOSIS — M25512 Pain in left shoulder: Secondary | ICD-10-CM | POA: Diagnosis not present

## 2015-09-29 DIAGNOSIS — S42302D Unspecified fracture of shaft of humerus, left arm, subsequent encounter for fracture with routine healing: Secondary | ICD-10-CM | POA: Diagnosis not present

## 2015-10-01 DIAGNOSIS — M25512 Pain in left shoulder: Secondary | ICD-10-CM | POA: Diagnosis not present

## 2015-10-01 DIAGNOSIS — S42302D Unspecified fracture of shaft of humerus, left arm, subsequent encounter for fracture with routine healing: Secondary | ICD-10-CM | POA: Diagnosis not present

## 2015-10-03 DIAGNOSIS — S42302D Unspecified fracture of shaft of humerus, left arm, subsequent encounter for fracture with routine healing: Secondary | ICD-10-CM | POA: Diagnosis not present

## 2015-10-03 DIAGNOSIS — M25512 Pain in left shoulder: Secondary | ICD-10-CM | POA: Diagnosis not present

## 2015-10-06 DIAGNOSIS — S42302D Unspecified fracture of shaft of humerus, left arm, subsequent encounter for fracture with routine healing: Secondary | ICD-10-CM | POA: Diagnosis not present

## 2015-10-06 DIAGNOSIS — M25512 Pain in left shoulder: Secondary | ICD-10-CM | POA: Diagnosis not present

## 2015-10-07 DIAGNOSIS — L609 Nail disorder, unspecified: Secondary | ICD-10-CM | POA: Diagnosis not present

## 2015-10-07 DIAGNOSIS — E1142 Type 2 diabetes mellitus with diabetic polyneuropathy: Secondary | ICD-10-CM | POA: Diagnosis not present

## 2015-10-08 DIAGNOSIS — M25512 Pain in left shoulder: Secondary | ICD-10-CM | POA: Diagnosis not present

## 2015-10-08 DIAGNOSIS — S42302D Unspecified fracture of shaft of humerus, left arm, subsequent encounter for fracture with routine healing: Secondary | ICD-10-CM | POA: Diagnosis not present

## 2015-10-10 DIAGNOSIS — M25512 Pain in left shoulder: Secondary | ICD-10-CM | POA: Diagnosis not present

## 2015-10-10 DIAGNOSIS — S42302D Unspecified fracture of shaft of humerus, left arm, subsequent encounter for fracture with routine healing: Secondary | ICD-10-CM | POA: Diagnosis not present

## 2015-10-13 DIAGNOSIS — S42302D Unspecified fracture of shaft of humerus, left arm, subsequent encounter for fracture with routine healing: Secondary | ICD-10-CM | POA: Diagnosis not present

## 2015-10-13 DIAGNOSIS — M25512 Pain in left shoulder: Secondary | ICD-10-CM | POA: Diagnosis not present

## 2015-10-14 DIAGNOSIS — I2510 Atherosclerotic heart disease of native coronary artery without angina pectoris: Secondary | ICD-10-CM | POA: Diagnosis not present

## 2015-10-14 DIAGNOSIS — E119 Type 2 diabetes mellitus without complications: Secondary | ICD-10-CM | POA: Diagnosis not present

## 2015-10-14 DIAGNOSIS — I10 Essential (primary) hypertension: Secondary | ICD-10-CM | POA: Diagnosis not present

## 2015-10-14 DIAGNOSIS — E7800 Pure hypercholesterolemia, unspecified: Secondary | ICD-10-CM | POA: Diagnosis not present

## 2015-10-15 DIAGNOSIS — S42302D Unspecified fracture of shaft of humerus, left arm, subsequent encounter for fracture with routine healing: Secondary | ICD-10-CM | POA: Diagnosis not present

## 2015-10-15 DIAGNOSIS — M25512 Pain in left shoulder: Secondary | ICD-10-CM | POA: Diagnosis not present

## 2015-10-16 DIAGNOSIS — I2510 Atherosclerotic heart disease of native coronary artery without angina pectoris: Secondary | ICD-10-CM | POA: Diagnosis not present

## 2015-10-16 DIAGNOSIS — E1165 Type 2 diabetes mellitus with hyperglycemia: Secondary | ICD-10-CM | POA: Diagnosis not present

## 2015-10-17 DIAGNOSIS — S42302D Unspecified fracture of shaft of humerus, left arm, subsequent encounter for fracture with routine healing: Secondary | ICD-10-CM | POA: Diagnosis not present

## 2015-10-17 DIAGNOSIS — M25512 Pain in left shoulder: Secondary | ICD-10-CM | POA: Diagnosis not present

## 2015-10-20 DIAGNOSIS — S42302D Unspecified fracture of shaft of humerus, left arm, subsequent encounter for fracture with routine healing: Secondary | ICD-10-CM | POA: Diagnosis not present

## 2015-10-20 DIAGNOSIS — M25512 Pain in left shoulder: Secondary | ICD-10-CM | POA: Diagnosis not present

## 2015-10-22 DIAGNOSIS — S42302D Unspecified fracture of shaft of humerus, left arm, subsequent encounter for fracture with routine healing: Secondary | ICD-10-CM | POA: Diagnosis not present

## 2015-10-22 DIAGNOSIS — M25512 Pain in left shoulder: Secondary | ICD-10-CM | POA: Diagnosis not present

## 2015-10-24 DIAGNOSIS — M25512 Pain in left shoulder: Secondary | ICD-10-CM | POA: Diagnosis not present

## 2015-10-24 DIAGNOSIS — S42302D Unspecified fracture of shaft of humerus, left arm, subsequent encounter for fracture with routine healing: Secondary | ICD-10-CM | POA: Diagnosis not present

## 2015-10-29 DIAGNOSIS — M25512 Pain in left shoulder: Secondary | ICD-10-CM | POA: Diagnosis not present

## 2015-10-30 DIAGNOSIS — Z9889 Other specified postprocedural states: Secondary | ICD-10-CM | POA: Diagnosis not present

## 2015-10-30 DIAGNOSIS — S42295A Other nondisplaced fracture of upper end of left humerus, initial encounter for closed fracture: Secondary | ICD-10-CM | POA: Diagnosis not present

## 2015-11-03 DIAGNOSIS — M25512 Pain in left shoulder: Secondary | ICD-10-CM | POA: Diagnosis not present

## 2015-11-05 DIAGNOSIS — E119 Type 2 diabetes mellitus without complications: Secondary | ICD-10-CM | POA: Diagnosis not present

## 2015-11-05 DIAGNOSIS — E7800 Pure hypercholesterolemia, unspecified: Secondary | ICD-10-CM | POA: Diagnosis not present

## 2015-11-05 DIAGNOSIS — I2510 Atherosclerotic heart disease of native coronary artery without angina pectoris: Secondary | ICD-10-CM | POA: Diagnosis not present

## 2015-11-05 DIAGNOSIS — M25512 Pain in left shoulder: Secondary | ICD-10-CM | POA: Diagnosis not present

## 2015-11-05 DIAGNOSIS — I10 Essential (primary) hypertension: Secondary | ICD-10-CM | POA: Diagnosis not present

## 2015-11-24 DIAGNOSIS — E7800 Pure hypercholesterolemia, unspecified: Secondary | ICD-10-CM | POA: Diagnosis not present

## 2015-11-24 DIAGNOSIS — G4700 Insomnia, unspecified: Secondary | ICD-10-CM | POA: Diagnosis not present

## 2015-11-24 DIAGNOSIS — E1165 Type 2 diabetes mellitus with hyperglycemia: Secondary | ICD-10-CM | POA: Diagnosis not present

## 2015-12-01 DIAGNOSIS — E119 Type 2 diabetes mellitus without complications: Secondary | ICD-10-CM | POA: Diagnosis not present

## 2015-12-01 DIAGNOSIS — H52223 Regular astigmatism, bilateral: Secondary | ICD-10-CM | POA: Diagnosis not present

## 2015-12-01 DIAGNOSIS — H5203 Hypermetropia, bilateral: Secondary | ICD-10-CM | POA: Diagnosis not present

## 2015-12-01 DIAGNOSIS — Z7984 Long term (current) use of oral hypoglycemic drugs: Secondary | ICD-10-CM | POA: Diagnosis not present

## 2015-12-09 DIAGNOSIS — L570 Actinic keratosis: Secondary | ICD-10-CM | POA: Diagnosis not present

## 2015-12-11 DIAGNOSIS — Z1389 Encounter for screening for other disorder: Secondary | ICD-10-CM | POA: Diagnosis not present

## 2015-12-11 DIAGNOSIS — Z7189 Other specified counseling: Secondary | ICD-10-CM | POA: Diagnosis not present

## 2015-12-11 DIAGNOSIS — Z1211 Encounter for screening for malignant neoplasm of colon: Secondary | ICD-10-CM | POA: Diagnosis not present

## 2015-12-11 DIAGNOSIS — Z6828 Body mass index (BMI) 28.0-28.9, adult: Secondary | ICD-10-CM | POA: Diagnosis not present

## 2015-12-11 DIAGNOSIS — Z0000 Encounter for general adult medical examination without abnormal findings: Secondary | ICD-10-CM | POA: Diagnosis not present

## 2015-12-11 DIAGNOSIS — Z299 Encounter for prophylactic measures, unspecified: Secondary | ICD-10-CM | POA: Diagnosis not present

## 2015-12-15 DIAGNOSIS — R5383 Other fatigue: Secondary | ICD-10-CM | POA: Diagnosis not present

## 2015-12-15 DIAGNOSIS — Z79899 Other long term (current) drug therapy: Secondary | ICD-10-CM | POA: Diagnosis not present

## 2015-12-15 DIAGNOSIS — E7800 Pure hypercholesterolemia, unspecified: Secondary | ICD-10-CM | POA: Diagnosis not present

## 2015-12-15 DIAGNOSIS — Z125 Encounter for screening for malignant neoplasm of prostate: Secondary | ICD-10-CM | POA: Diagnosis not present

## 2015-12-16 DIAGNOSIS — L609 Nail disorder, unspecified: Secondary | ICD-10-CM | POA: Diagnosis not present

## 2015-12-16 DIAGNOSIS — E1142 Type 2 diabetes mellitus with diabetic polyneuropathy: Secondary | ICD-10-CM | POA: Diagnosis not present

## 2016-01-15 DIAGNOSIS — Z23 Encounter for immunization: Secondary | ICD-10-CM | POA: Diagnosis not present

## 2016-02-02 ENCOUNTER — Other Ambulatory Visit: Payer: Self-pay | Admitting: Cardiovascular Disease

## 2016-02-04 DIAGNOSIS — E119 Type 2 diabetes mellitus without complications: Secondary | ICD-10-CM | POA: Diagnosis not present

## 2016-02-04 DIAGNOSIS — I2510 Atherosclerotic heart disease of native coronary artery without angina pectoris: Secondary | ICD-10-CM | POA: Diagnosis not present

## 2016-02-04 DIAGNOSIS — E7800 Pure hypercholesterolemia, unspecified: Secondary | ICD-10-CM | POA: Diagnosis not present

## 2016-02-04 DIAGNOSIS — I10 Essential (primary) hypertension: Secondary | ICD-10-CM | POA: Diagnosis not present

## 2016-02-24 DIAGNOSIS — L609 Nail disorder, unspecified: Secondary | ICD-10-CM | POA: Diagnosis not present

## 2016-02-24 DIAGNOSIS — E1142 Type 2 diabetes mellitus with diabetic polyneuropathy: Secondary | ICD-10-CM | POA: Diagnosis not present

## 2016-03-01 DIAGNOSIS — I10 Essential (primary) hypertension: Secondary | ICD-10-CM | POA: Diagnosis not present

## 2016-03-01 DIAGNOSIS — Z6828 Body mass index (BMI) 28.0-28.9, adult: Secondary | ICD-10-CM | POA: Diagnosis not present

## 2016-03-01 DIAGNOSIS — Z299 Encounter for prophylactic measures, unspecified: Secondary | ICD-10-CM | POA: Diagnosis not present

## 2016-03-01 DIAGNOSIS — E1165 Type 2 diabetes mellitus with hyperglycemia: Secondary | ICD-10-CM | POA: Diagnosis not present

## 2016-03-01 DIAGNOSIS — Z713 Dietary counseling and surveillance: Secondary | ICD-10-CM | POA: Diagnosis not present

## 2016-03-09 ENCOUNTER — Ambulatory Visit: Payer: Medicare Other | Admitting: Cardiovascular Disease

## 2016-04-02 ENCOUNTER — Encounter: Payer: Self-pay | Admitting: Cardiovascular Disease

## 2016-04-02 ENCOUNTER — Ambulatory Visit (INDEPENDENT_AMBULATORY_CARE_PROVIDER_SITE_OTHER): Payer: Medicare Other | Admitting: Cardiovascular Disease

## 2016-04-02 VITALS — BP 119/73 | HR 73 | Ht 71.0 in | Wt 203.0 lb

## 2016-04-02 DIAGNOSIS — I10 Essential (primary) hypertension: Secondary | ICD-10-CM | POA: Diagnosis not present

## 2016-04-02 DIAGNOSIS — E785 Hyperlipidemia, unspecified: Secondary | ICD-10-CM | POA: Diagnosis not present

## 2016-04-02 DIAGNOSIS — I25810 Atherosclerosis of coronary artery bypass graft(s) without angina pectoris: Secondary | ICD-10-CM

## 2016-04-02 DIAGNOSIS — R42 Dizziness and giddiness: Secondary | ICD-10-CM

## 2016-04-02 DIAGNOSIS — Z951 Presence of aortocoronary bypass graft: Secondary | ICD-10-CM

## 2016-04-02 DIAGNOSIS — R2689 Other abnormalities of gait and mobility: Secondary | ICD-10-CM

## 2016-04-02 DIAGNOSIS — I5032 Chronic diastolic (congestive) heart failure: Secondary | ICD-10-CM

## 2016-04-02 MED ORDER — FUROSEMIDE 20 MG PO TABS: ORAL_TABLET | ORAL | 3 refills | Status: DC

## 2016-04-02 NOTE — Progress Notes (Signed)
SUBJECTIVE: The patient presents for routine follow-up. He has a medical history significant for CABG several years ago. His last cath was in 08/11/2001 and demonstrated patent grafts.  His wife of 65 years passed away in 08-12-14. He denies exertional angina. He has vertigo and chronic dizziness but denies syncope. He has significant balance problems. He is still taking Lasix 60 mg daily.   Review of Systems: As per "subjective", otherwise negative.  Allergies  Allergen Reactions  . Sulfonamide Derivatives   . Tetracycline     Current Outpatient Prescriptions  Medication Sig Dispense Refill  . alfuzosin (UROXATRAL) 10 MG 24 hr tablet Take 10 mg by mouth daily.    Marland Kitchen aspirin EC 81 MG tablet Take 1 tablet (81 mg total) by mouth daily.    . benazepril (LOTENSIN) 20 MG tablet Take 20 mg by mouth daily.      . citalopram (CELEXA) 20 MG tablet Take 20 mg by mouth daily.     . diazepam (VALIUM) 5 MG tablet Take 5 mg by mouth at bedtime as needed. For nerves     . finasteride (PROSCAR) 5 MG tablet Take 5 mg by mouth daily.    . furosemide (LASIX) 40 MG tablet Take 40 mg in morning and 20 mg in evening     . metFORMIN (GLUCOPHAGE) 500 MG tablet Take 500 mg by mouth daily with breakfast.     . metoprolol tartrate (LOPRESSOR) 25 MG tablet TAKE 1/2 OF A TABLET BY MOUTH TWICE DAILY 30 tablet 3  . Multiple Vitamin (MULTIVITAMIN) tablet Take 1 tablet by mouth daily.      . Omega-3 Fatty Acids (FISH OIL) 1200 MG CAPS Take 1 capsule by mouth daily.      Marland Kitchen rOPINIRole (REQUIP) 1 MG tablet Take 1 tab in AM and 2 PM    . simvastatin (ZOCOR) 40 MG tablet Take 1 tablet (40 mg total) by mouth at bedtime. 7 tablet 0  . traMADol (ULTRAM) 50 MG tablet Take 1 tablet by mouth daily as needed.     No current facility-administered medications for this visit.     Past Medical History:  Diagnosis Date  . Aortic insufficiency    Mild, echo, January, 2012  //   Mild, echo, February, 2014  . Asbestos exposure   .  Bradycardia    MILD  . CAD (coronary artery disease) 2001/08/11   last catheterization 08-11-01, potential ischemia in the ramus, grafts patent  ( last nuclear Aug 11, 2000)  . Carotid artery disease ([place]) 12-Aug-1999   BILATERAL 0-39%/  Follow with Dopplers by Dr. Woody Seller  . Colitis   . Diabetes mellitus   . Dizziness    He has had falls related to this / Chronic dizziness  . Dyslipidemia   . Ejection fraction    50%, echo, January, 2012, no mention of significant diastolic dysfunction  . Hx of CABG   . Hypertension   . Mitral regurgitation    Mild, echo, February, 2014  . Nephrolithiasis   . Pneumonia    left lower lobe  . PVD (peripheral vascular disease) ([place])   . Renal cyst   . Shortness of breath    May, 2012  . SOB (shortness of breath)   . TIA (transient ischemic attack) August 11, 2005   ?? medical Rx     Past Surgical History:  Procedure Laterality Date  . CORONARY ARTERY BYPASS GRAFT    . percutaneous transluminal coronary angioplasty     hx  Social History   Social History  . Marital status: Married    Spouse name: N/A  . Number of children: 4  . Years of education: N/A   Occupational History  . RETIRED    Social History Main Topics  . Smoking status: Former Smoker    Packs/day: 2.00    Years: 30.00    Types: Cigarettes    Start date: 12/23/1946    Quit date: 04/26/1980  . Smokeless tobacco: Never Used  . Alcohol use Not on file  . Drug use: Unknown  . Sexual activity: Not on file   Other Topics Concern  . Not on file   Social History Narrative  . No narrative on file     Vitals:   04/02/16 1447  BP: 119/73  Pulse: 73  Weight: 203 lb (92.1 kg)  Height: 5\' 11"  (1.803 m)    PHYSICAL EXAM General: NAD HEENT: Normal. Neck: No JVD, no thyromegaly. Lungs: Clear to auscultation bilaterally with normal respiratory effort. CV: Nondisplaced PMI.  Regular rate and rhythm, normal S1/S2, no S3/S4, no murmur. No pretibial or periankle edema.    Abdomen: Soft, nontender, no  distention.  Neurologic: Alert and oriented.  Psych: Normal affect. Skin: Normal.   ECG: Most recent ECG reviewed.      ASSESSMENT AND PLAN: 1. CAD/CABG: Stable ischemic heart disease. Continue ASA and statin.  2. Essential HTN: Controlled. Monitor given discontinuation of metoprolol.  3. Chronic diastolic heart failure: Given problems with dizziness and imbalance, I will switch daily Lasix to 20 mg as needed for leg swelling.  4. Dizziness and imbalance: I will discontinue metoprolol and switch daily Lasix to 20 mg as needed for leg swelling to see if this helps to alleviate his symptoms.  Dispo: f/u 1 yr   Kate Sable, M.D., F.A.C.C.

## 2016-04-02 NOTE — Patient Instructions (Signed)
Your physician wants you to follow-up in: [place] You will receive a reminder letter in the mail two months in advance. If you don't receive a letter, please call our office to schedule the follow-up appointment.  Your physician has recommended you make the following change in your medication:   STOP METOPROLOL   DECREASE LASIX 20 MG AS NEEDED FOR SWELLING  Thank you for choosing [facility]!!

## 2016-04-05 ENCOUNTER — Telehealth: Payer: Self-pay | Admitting: Cardiovascular Disease

## 2016-04-05 NOTE — Telephone Encounter (Signed)
[facility] called to get clarification on furosemide 20 mg as needed frequency. Pharmacy advised its daily as needed swelling.

## 2016-04-05 NOTE — Telephone Encounter (Signed)
Please call [name]  [phone] in regards to E Script that was sent out on Friday 04/02/16.

## 2016-04-21 DIAGNOSIS — Z299 Encounter for prophylactic measures, unspecified: Secondary | ICD-10-CM | POA: Diagnosis not present

## 2016-04-21 DIAGNOSIS — I4891 Unspecified atrial fibrillation: Secondary | ICD-10-CM | POA: Diagnosis not present

## 2016-04-21 DIAGNOSIS — E1165 Type 2 diabetes mellitus with hyperglycemia: Secondary | ICD-10-CM | POA: Diagnosis not present

## 2016-04-21 DIAGNOSIS — J069 Acute upper respiratory infection, unspecified: Secondary | ICD-10-CM | POA: Diagnosis not present

## 2016-04-21 DIAGNOSIS — K219 Gastro-esophageal reflux disease without esophagitis: Secondary | ICD-10-CM | POA: Diagnosis not present

## 2016-04-23 DIAGNOSIS — E7800 Pure hypercholesterolemia, unspecified: Secondary | ICD-10-CM | POA: Diagnosis not present

## 2016-04-23 DIAGNOSIS — I2510 Atherosclerotic heart disease of native coronary artery without angina pectoris: Secondary | ICD-10-CM | POA: Diagnosis not present

## 2016-04-23 DIAGNOSIS — E119 Type 2 diabetes mellitus without complications: Secondary | ICD-10-CM | POA: Diagnosis not present

## 2016-04-23 DIAGNOSIS — I10 Essential (primary) hypertension: Secondary | ICD-10-CM | POA: Diagnosis not present

## 2016-04-30 DIAGNOSIS — E1142 Type 2 diabetes mellitus with diabetic polyneuropathy: Secondary | ICD-10-CM | POA: Diagnosis not present

## 2016-04-30 DIAGNOSIS — L609 Nail disorder, unspecified: Secondary | ICD-10-CM | POA: Diagnosis not present

## 2016-06-09 DIAGNOSIS — L905 Scar conditions and fibrosis of skin: Secondary | ICD-10-CM | POA: Diagnosis not present

## 2016-06-09 DIAGNOSIS — D485 Neoplasm of uncertain behavior of skin: Secondary | ICD-10-CM | POA: Diagnosis not present

## 2016-06-09 DIAGNOSIS — L570 Actinic keratosis: Secondary | ICD-10-CM | POA: Diagnosis not present

## 2016-06-09 DIAGNOSIS — Z85828 Personal history of other malignant neoplasm of skin: Secondary | ICD-10-CM | POA: Diagnosis not present

## 2016-06-14 DIAGNOSIS — I2510 Atherosclerotic heart disease of native coronary artery without angina pectoris: Secondary | ICD-10-CM | POA: Diagnosis not present

## 2016-06-14 DIAGNOSIS — E7800 Pure hypercholesterolemia, unspecified: Secondary | ICD-10-CM | POA: Diagnosis not present

## 2016-06-14 DIAGNOSIS — E119 Type 2 diabetes mellitus without complications: Secondary | ICD-10-CM | POA: Diagnosis not present

## 2016-06-14 DIAGNOSIS — I10 Essential (primary) hypertension: Secondary | ICD-10-CM | POA: Diagnosis not present

## 2016-06-21 DIAGNOSIS — Z299 Encounter for prophylactic measures, unspecified: Secondary | ICD-10-CM | POA: Diagnosis not present

## 2016-06-21 DIAGNOSIS — E1165 Type 2 diabetes mellitus with hyperglycemia: Secondary | ICD-10-CM | POA: Diagnosis not present

## 2016-06-21 DIAGNOSIS — K219 Gastro-esophageal reflux disease without esophagitis: Secondary | ICD-10-CM | POA: Diagnosis not present

## 2016-06-21 DIAGNOSIS — Z87891 Personal history of nicotine dependence: Secondary | ICD-10-CM | POA: Diagnosis not present

## 2016-06-21 DIAGNOSIS — I340 Nonrheumatic mitral (valve) insufficiency: Secondary | ICD-10-CM | POA: Diagnosis not present

## 2016-06-21 DIAGNOSIS — Z713 Dietary counseling and surveillance: Secondary | ICD-10-CM | POA: Diagnosis not present

## 2016-06-21 DIAGNOSIS — R05 Cough: Secondary | ICD-10-CM | POA: Diagnosis not present

## 2016-06-21 DIAGNOSIS — N400 Enlarged prostate without lower urinary tract symptoms: Secondary | ICD-10-CM | POA: Diagnosis not present

## 2016-06-21 DIAGNOSIS — R21 Rash and other nonspecific skin eruption: Secondary | ICD-10-CM | POA: Diagnosis not present

## 2016-06-21 DIAGNOSIS — I4891 Unspecified atrial fibrillation: Secondary | ICD-10-CM | POA: Diagnosis not present

## 2016-06-21 DIAGNOSIS — I2510 Atherosclerotic heart disease of native coronary artery without angina pectoris: Secondary | ICD-10-CM | POA: Diagnosis not present

## 2016-06-24 DIAGNOSIS — Z5189 Encounter for other specified aftercare: Secondary | ICD-10-CM | POA: Diagnosis not present

## 2016-07-09 DIAGNOSIS — L609 Nail disorder, unspecified: Secondary | ICD-10-CM | POA: Diagnosis not present

## 2016-07-09 DIAGNOSIS — E1142 Type 2 diabetes mellitus with diabetic polyneuropathy: Secondary | ICD-10-CM | POA: Diagnosis not present

## 2016-07-12 DIAGNOSIS — E119 Type 2 diabetes mellitus without complications: Secondary | ICD-10-CM | POA: Diagnosis not present

## 2016-07-12 DIAGNOSIS — I2510 Atherosclerotic heart disease of native coronary artery without angina pectoris: Secondary | ICD-10-CM | POA: Diagnosis not present

## 2016-07-12 DIAGNOSIS — E7800 Pure hypercholesterolemia, unspecified: Secondary | ICD-10-CM | POA: Diagnosis not present

## 2016-07-12 DIAGNOSIS — I10 Essential (primary) hypertension: Secondary | ICD-10-CM | POA: Diagnosis not present

## 2016-08-12 DIAGNOSIS — E119 Type 2 diabetes mellitus without complications: Secondary | ICD-10-CM | POA: Diagnosis not present

## 2016-08-12 DIAGNOSIS — E7800 Pure hypercholesterolemia, unspecified: Secondary | ICD-10-CM | POA: Diagnosis not present

## 2016-08-12 DIAGNOSIS — I2510 Atherosclerotic heart disease of native coronary artery without angina pectoris: Secondary | ICD-10-CM | POA: Diagnosis not present

## 2016-08-12 DIAGNOSIS — I10 Essential (primary) hypertension: Secondary | ICD-10-CM | POA: Diagnosis not present

## 2016-09-24 DIAGNOSIS — Z6829 Body mass index (BMI) 29.0-29.9, adult: Secondary | ICD-10-CM | POA: Diagnosis not present

## 2016-09-24 DIAGNOSIS — R918 Other nonspecific abnormal finding of lung field: Secondary | ICD-10-CM | POA: Diagnosis not present

## 2016-09-24 DIAGNOSIS — E7800 Pure hypercholesterolemia, unspecified: Secondary | ICD-10-CM | POA: Diagnosis not present

## 2016-09-24 DIAGNOSIS — I2510 Atherosclerotic heart disease of native coronary artery without angina pectoris: Secondary | ICD-10-CM | POA: Diagnosis not present

## 2016-09-24 DIAGNOSIS — I10 Essential (primary) hypertension: Secondary | ICD-10-CM | POA: Diagnosis not present

## 2016-09-24 DIAGNOSIS — R05 Cough: Secondary | ICD-10-CM | POA: Diagnosis not present

## 2016-09-24 DIAGNOSIS — N400 Enlarged prostate without lower urinary tract symptoms: Secondary | ICD-10-CM | POA: Diagnosis not present

## 2016-09-24 DIAGNOSIS — I4891 Unspecified atrial fibrillation: Secondary | ICD-10-CM | POA: Diagnosis not present

## 2016-09-24 DIAGNOSIS — Z299 Encounter for prophylactic measures, unspecified: Secondary | ICD-10-CM | POA: Diagnosis not present

## 2016-09-24 DIAGNOSIS — E1165 Type 2 diabetes mellitus with hyperglycemia: Secondary | ICD-10-CM | POA: Diagnosis not present

## 2016-09-24 DIAGNOSIS — K219 Gastro-esophageal reflux disease without esophagitis: Secondary | ICD-10-CM | POA: Diagnosis not present

## 2016-10-12 DIAGNOSIS — I10 Essential (primary) hypertension: Secondary | ICD-10-CM | POA: Diagnosis not present

## 2016-10-12 DIAGNOSIS — I2510 Atherosclerotic heart disease of native coronary artery without angina pectoris: Secondary | ICD-10-CM | POA: Diagnosis not present

## 2016-10-12 DIAGNOSIS — E7800 Pure hypercholesterolemia, unspecified: Secondary | ICD-10-CM | POA: Diagnosis not present

## 2016-10-12 DIAGNOSIS — E119 Type 2 diabetes mellitus without complications: Secondary | ICD-10-CM | POA: Diagnosis not present

## 2016-12-08 DIAGNOSIS — L570 Actinic keratosis: Secondary | ICD-10-CM | POA: Diagnosis not present

## 2016-12-09 DIAGNOSIS — I2510 Atherosclerotic heart disease of native coronary artery without angina pectoris: Secondary | ICD-10-CM | POA: Diagnosis not present

## 2016-12-09 DIAGNOSIS — I10 Essential (primary) hypertension: Secondary | ICD-10-CM | POA: Diagnosis not present

## 2016-12-09 DIAGNOSIS — E7800 Pure hypercholesterolemia, unspecified: Secondary | ICD-10-CM | POA: Diagnosis not present

## 2016-12-09 DIAGNOSIS — E119 Type 2 diabetes mellitus without complications: Secondary | ICD-10-CM | POA: Diagnosis not present

## 2016-12-15 DIAGNOSIS — G2581 Restless legs syndrome: Secondary | ICD-10-CM | POA: Diagnosis not present

## 2016-12-15 DIAGNOSIS — Z7189 Other specified counseling: Secondary | ICD-10-CM | POA: Diagnosis not present

## 2016-12-15 DIAGNOSIS — I2510 Atherosclerotic heart disease of native coronary artery without angina pectoris: Secondary | ICD-10-CM | POA: Diagnosis not present

## 2016-12-15 DIAGNOSIS — R5383 Other fatigue: Secondary | ICD-10-CM | POA: Diagnosis not present

## 2016-12-15 DIAGNOSIS — Z299 Encounter for prophylactic measures, unspecified: Secondary | ICD-10-CM | POA: Diagnosis not present

## 2016-12-15 DIAGNOSIS — Z6829 Body mass index (BMI) 29.0-29.9, adult: Secondary | ICD-10-CM | POA: Diagnosis not present

## 2016-12-15 DIAGNOSIS — I10 Essential (primary) hypertension: Secondary | ICD-10-CM | POA: Diagnosis not present

## 2016-12-15 DIAGNOSIS — Z0000 Encounter for general adult medical examination without abnormal findings: Secondary | ICD-10-CM | POA: Diagnosis not present

## 2016-12-15 DIAGNOSIS — I340 Nonrheumatic mitral (valve) insufficiency: Secondary | ICD-10-CM | POA: Diagnosis not present

## 2016-12-15 DIAGNOSIS — E1165 Type 2 diabetes mellitus with hyperglycemia: Secondary | ICD-10-CM | POA: Diagnosis not present

## 2016-12-15 DIAGNOSIS — I4891 Unspecified atrial fibrillation: Secondary | ICD-10-CM | POA: Diagnosis not present

## 2016-12-15 DIAGNOSIS — Z1389 Encounter for screening for other disorder: Secondary | ICD-10-CM | POA: Diagnosis not present

## 2016-12-16 DIAGNOSIS — E7800 Pure hypercholesterolemia, unspecified: Secondary | ICD-10-CM | POA: Diagnosis not present

## 2016-12-16 DIAGNOSIS — Z79899 Other long term (current) drug therapy: Secondary | ICD-10-CM | POA: Diagnosis not present

## 2016-12-16 DIAGNOSIS — R5383 Other fatigue: Secondary | ICD-10-CM | POA: Diagnosis not present

## 2016-12-16 DIAGNOSIS — Z125 Encounter for screening for malignant neoplasm of prostate: Secondary | ICD-10-CM | POA: Diagnosis not present

## 2016-12-28 DIAGNOSIS — I2510 Atherosclerotic heart disease of native coronary artery without angina pectoris: Secondary | ICD-10-CM | POA: Diagnosis not present

## 2016-12-28 DIAGNOSIS — E7800 Pure hypercholesterolemia, unspecified: Secondary | ICD-10-CM | POA: Diagnosis not present

## 2016-12-28 DIAGNOSIS — E119 Type 2 diabetes mellitus without complications: Secondary | ICD-10-CM | POA: Diagnosis not present

## 2016-12-28 DIAGNOSIS — I10 Essential (primary) hypertension: Secondary | ICD-10-CM | POA: Diagnosis not present

## 2017-01-12 DIAGNOSIS — M1710 Unilateral primary osteoarthritis, unspecified knee: Secondary | ICD-10-CM | POA: Diagnosis not present

## 2017-01-12 DIAGNOSIS — N400 Enlarged prostate without lower urinary tract symptoms: Secondary | ICD-10-CM | POA: Diagnosis not present

## 2017-01-12 DIAGNOSIS — I4891 Unspecified atrial fibrillation: Secondary | ICD-10-CM | POA: Diagnosis not present

## 2017-01-12 DIAGNOSIS — E1165 Type 2 diabetes mellitus with hyperglycemia: Secondary | ICD-10-CM | POA: Diagnosis not present

## 2017-01-12 DIAGNOSIS — I2510 Atherosclerotic heart disease of native coronary artery without angina pectoris: Secondary | ICD-10-CM | POA: Diagnosis not present

## 2017-01-12 DIAGNOSIS — Z299 Encounter for prophylactic measures, unspecified: Secondary | ICD-10-CM | POA: Diagnosis not present

## 2017-01-12 DIAGNOSIS — Z713 Dietary counseling and surveillance: Secondary | ICD-10-CM | POA: Diagnosis not present

## 2017-01-12 DIAGNOSIS — E663 Overweight: Secondary | ICD-10-CM | POA: Diagnosis not present

## 2017-01-12 DIAGNOSIS — I340 Nonrheumatic mitral (valve) insufficiency: Secondary | ICD-10-CM | POA: Diagnosis not present

## 2017-01-27 DIAGNOSIS — I2510 Atherosclerotic heart disease of native coronary artery without angina pectoris: Secondary | ICD-10-CM | POA: Diagnosis not present

## 2017-01-27 DIAGNOSIS — I10 Essential (primary) hypertension: Secondary | ICD-10-CM | POA: Diagnosis not present

## 2017-01-27 DIAGNOSIS — E119 Type 2 diabetes mellitus without complications: Secondary | ICD-10-CM | POA: Diagnosis not present

## 2017-01-27 DIAGNOSIS — E7800 Pure hypercholesterolemia, unspecified: Secondary | ICD-10-CM | POA: Diagnosis not present

## 2017-01-29 DIAGNOSIS — Z23 Encounter for immunization: Secondary | ICD-10-CM | POA: Diagnosis not present

## 2017-02-24 DIAGNOSIS — I10 Essential (primary) hypertension: Secondary | ICD-10-CM | POA: Diagnosis not present

## 2017-02-24 DIAGNOSIS — S40012A Contusion of left shoulder, initial encounter: Secondary | ICD-10-CM | POA: Diagnosis not present

## 2017-02-24 DIAGNOSIS — M25512 Pain in left shoulder: Secondary | ICD-10-CM | POA: Diagnosis not present

## 2017-02-24 DIAGNOSIS — Z7982 Long term (current) use of aspirin: Secondary | ICD-10-CM | POA: Diagnosis not present

## 2017-02-24 DIAGNOSIS — Z8673 Personal history of transient ischemic attack (TIA), and cerebral infarction without residual deficits: Secondary | ICD-10-CM | POA: Diagnosis not present

## 2017-02-24 DIAGNOSIS — W01198A Fall on same level from slipping, tripping and stumbling with subsequent striking against other object, initial encounter: Secondary | ICD-10-CM | POA: Diagnosis not present

## 2017-02-24 DIAGNOSIS — E7800 Pure hypercholesterolemia, unspecified: Secondary | ICD-10-CM | POA: Diagnosis not present

## 2017-02-24 DIAGNOSIS — Z7984 Long term (current) use of oral hypoglycemic drugs: Secondary | ICD-10-CM | POA: Diagnosis not present

## 2017-02-24 DIAGNOSIS — Z951 Presence of aortocoronary bypass graft: Secondary | ICD-10-CM | POA: Diagnosis not present

## 2017-02-24 DIAGNOSIS — S5002XA Contusion of left elbow, initial encounter: Secondary | ICD-10-CM | POA: Diagnosis not present

## 2017-02-24 DIAGNOSIS — Z86718 Personal history of other venous thrombosis and embolism: Secondary | ICD-10-CM | POA: Diagnosis not present

## 2017-02-24 DIAGNOSIS — Z87891 Personal history of nicotine dependence: Secondary | ICD-10-CM | POA: Diagnosis not present

## 2017-02-24 DIAGNOSIS — I252 Old myocardial infarction: Secondary | ICD-10-CM | POA: Diagnosis not present

## 2017-02-24 DIAGNOSIS — M719 Bursopathy, unspecified: Secondary | ICD-10-CM | POA: Diagnosis not present

## 2017-02-24 DIAGNOSIS — N400 Enlarged prostate without lower urinary tract symptoms: Secondary | ICD-10-CM | POA: Diagnosis not present

## 2017-02-24 DIAGNOSIS — Z79899 Other long term (current) drug therapy: Secondary | ICD-10-CM | POA: Diagnosis not present

## 2017-02-24 DIAGNOSIS — Z85820 Personal history of malignant melanoma of skin: Secondary | ICD-10-CM | POA: Diagnosis not present

## 2017-02-24 DIAGNOSIS — M7989 Other specified soft tissue disorders: Secondary | ICD-10-CM | POA: Diagnosis not present

## 2017-02-24 DIAGNOSIS — S51012A Laceration without foreign body of left elbow, initial encounter: Secondary | ICD-10-CM | POA: Diagnosis not present

## 2017-02-24 DIAGNOSIS — E119 Type 2 diabetes mellitus without complications: Secondary | ICD-10-CM | POA: Diagnosis not present

## 2017-02-24 DIAGNOSIS — G2581 Restless legs syndrome: Secondary | ICD-10-CM | POA: Diagnosis not present

## 2017-02-24 DIAGNOSIS — F329 Major depressive disorder, single episode, unspecified: Secondary | ICD-10-CM | POA: Diagnosis not present

## 2017-03-28 DIAGNOSIS — L309 Dermatitis, unspecified: Secondary | ICD-10-CM | POA: Diagnosis not present

## 2017-03-28 DIAGNOSIS — D485 Neoplasm of uncertain behavior of skin: Secondary | ICD-10-CM | POA: Diagnosis not present

## 2017-03-28 DIAGNOSIS — L111 Transient acantholytic dermatosis [Grover]: Secondary | ICD-10-CM | POA: Diagnosis not present

## 2017-03-31 DIAGNOSIS — I2510 Atherosclerotic heart disease of native coronary artery without angina pectoris: Secondary | ICD-10-CM | POA: Diagnosis not present

## 2017-03-31 DIAGNOSIS — E119 Type 2 diabetes mellitus without complications: Secondary | ICD-10-CM | POA: Diagnosis not present

## 2017-03-31 DIAGNOSIS — E7800 Pure hypercholesterolemia, unspecified: Secondary | ICD-10-CM | POA: Diagnosis not present

## 2017-03-31 DIAGNOSIS — I10 Essential (primary) hypertension: Secondary | ICD-10-CM | POA: Diagnosis not present

## 2017-04-14 ENCOUNTER — Telehealth: Payer: Self-pay | Admitting: Cardiovascular Disease

## 2017-04-14 ENCOUNTER — Ambulatory Visit (INDEPENDENT_AMBULATORY_CARE_PROVIDER_SITE_OTHER): Payer: Medicare Other | Admitting: Cardiovascular Disease

## 2017-04-14 ENCOUNTER — Encounter: Payer: Self-pay | Admitting: Cardiovascular Disease

## 2017-04-14 ENCOUNTER — Encounter: Payer: Self-pay | Admitting: *Deleted

## 2017-04-14 VITALS — BP 110/64 | HR 91 | Ht 71.0 in | Wt 212.0 lb

## 2017-04-14 DIAGNOSIS — E785 Hyperlipidemia, unspecified: Secondary | ICD-10-CM

## 2017-04-14 DIAGNOSIS — I5032 Chronic diastolic (congestive) heart failure: Secondary | ICD-10-CM

## 2017-04-14 DIAGNOSIS — I10 Essential (primary) hypertension: Secondary | ICD-10-CM | POA: Diagnosis not present

## 2017-04-14 DIAGNOSIS — R600 Localized edema: Secondary | ICD-10-CM | POA: Diagnosis not present

## 2017-04-14 DIAGNOSIS — R0609 Other forms of dyspnea: Secondary | ICD-10-CM | POA: Diagnosis not present

## 2017-04-14 DIAGNOSIS — I209 Angina pectoris, unspecified: Secondary | ICD-10-CM | POA: Diagnosis not present

## 2017-04-14 DIAGNOSIS — R0600 Dyspnea, unspecified: Secondary | ICD-10-CM

## 2017-04-14 DIAGNOSIS — I25708 Atherosclerosis of coronary artery bypass graft(s), unspecified, with other forms of angina pectoris: Secondary | ICD-10-CM | POA: Diagnosis not present

## 2017-04-14 MED ORDER — NITROGLYCERIN 0.4 MG SL SUBL: 0.4000 mg | SUBLINGUAL_TABLET | SUBLINGUAL | 3 refills | Status: DC | PRN

## 2017-04-14 NOTE — Telephone Encounter (Signed)
Lexiscan -scheduled at [facility] on Apr 29, 2017 arrive at 9:15 Echo scheduled in [facility] Apr 20, 2017 DX for both - doe, angina pectoris

## 2017-04-14 NOTE — Progress Notes (Signed)
SUBJECTIVE: The patient presents for routine follow-up. He has a medical history significant for CABG in 17-Aug-1992. His last cath was in 2001/08/17 and demonstrated patent grafts.  His wife of 64 years passed away in 08-18-2014.  ECG performed today which I personally interpreted demonstrated sinus rhythm with old inferior and anterior infarcts and nonspecific ST segment abnormalities and nonspecific intraventricular conduction delay.  He said he has been feeling well.  He is bothered by restless leg syndrome and has a difficult time sitting down and has to stand to watch TV.  He said he was bothered by daily chest pain for several days a few months ago.  He did not inform me of this until today.  He did not go to the hospital either.  He has been experiencing progressive exertional dyspnea.  He also had a fall and injured his right knee.  He has had bilateral leg swelling.  He has also been bothered by headaches.  His daughter says he has been sleeping a lot.    Review of Systems: As per "subjective", otherwise negative.  Allergies  Allergen Reactions  . Sulfonamide Derivatives   . Tetracycline     Current Outpatient Medications  Medication Sig Dispense Refill  . alfuzosin (UROXATRAL) 10 MG 24 hr tablet Take 10 mg by mouth daily.    Marland Kitchen aspirin EC 81 MG tablet Take 1 tablet (81 mg total) by mouth daily.    . benazepril (LOTENSIN) 20 MG tablet Take 20 mg by mouth daily.      . citalopram (CELEXA) 20 MG tablet Take 20 mg by mouth daily.     . diazepam (VALIUM) 5 MG tablet Take 5 mg by mouth at bedtime as needed. For nerves     . finasteride (PROSCAR) 5 MG tablet Take 5 mg by mouth daily.    . furosemide (LASIX) 40 MG tablet Take 40 mg by mouth daily.    . metFORMIN (GLUCOPHAGE) 500 MG tablet Take 500 mg by mouth daily with breakfast.     . Multiple Vitamin (MULTIVITAMIN) tablet Take 1 tablet by mouth daily.      . Omega-3 Fatty Acids (FISH OIL) 1200 MG CAPS Take 1 capsule by mouth daily.      Marland Kitchen  rOPINIRole (REQUIP) 1 MG tablet Take 1 tab in AM and 2 PM    . simvastatin (ZOCOR) 40 MG tablet Take 1 tablet (40 mg total) by mouth at bedtime. 7 tablet 0  . traMADol (ULTRAM) 50 MG tablet Take 1 tablet by mouth daily as needed.     No current facility-administered medications for this visit.     Past Medical History:  Diagnosis Date  . Aortic insufficiency    Mild, echo, January, 2012  //   Mild, echo, February, 2014  . Asbestos exposure   . Bradycardia    MILD  . CAD (coronary artery disease) 08-17-01   last catheterization 08/17/2001, potential ischemia in the ramus, grafts patent  ( last nuclear 08/17/00)  . Carotid artery disease ([place]) Aug 18, 1999   BILATERAL 0-39%/  Follow with Dopplers by Dr. Woody Seller  . Colitis   . Diabetes mellitus   . Dizziness    He has had falls related to this / Chronic dizziness  . Dyslipidemia   . Ejection fraction    50%, echo, January, 2012, no mention of significant diastolic dysfunction  . Hx of CABG   . Hypertension   . Mitral regurgitation    Mild, echo, February,  2014  . Nephrolithiasis   . Pneumonia    left lower lobe  . PVD (peripheral vascular disease) ([place])   . Renal cyst   . Shortness of breath    May, 2012  . SOB (shortness of breath)   . TIA (transient ischemic attack) 2007   ?? medical Rx     Past Surgical History:  Procedure Laterality Date  . CORONARY ARTERY BYPASS GRAFT    . percutaneous transluminal coronary angioplasty     hx    Social History   Socioeconomic History  . Marital status: Married    Spouse name: Not on file  . Number of children: 4  . Years of education: Not on file  . Highest education level: Not on file  Social Needs  . Financial resource strain: Not on file  . Food insecurity - worry: Not on file  . Food insecurity - inability: Not on file  . Transportation needs - medical: Not on file  . Transportation needs - non-medical: Not on file  Occupational History  . Occupation: RETIRED  Tobacco Use  . Smoking  status: Former Smoker    Packs/day: 2.00    Years: 30.00    Pack years: 60.00    Types: Cigarettes    Start date: 12/23/1946    Last attempt to quit: 04/26/1980    Years since quitting: 36.9  . Smokeless tobacco: Never Used  Substance and Sexual Activity  . Alcohol use: Not on file  . Drug use: Not on file  . Sexual activity: Not on file  Other Topics Concern  . Not on file  Social History Narrative  . Not on file     Vitals:   04/14/17 1337  BP: 110/64  Pulse: 91  SpO2: 95%  Weight: 212 lb (96.2 kg)  Height: 5\' 11"  (1.803 m)    Wt Readings from Last 3 Encounters:  04/14/17 212 lb (96.2 kg)  04/02/16 203 lb (92.1 kg)  09/24/15 202 lb (91.6 kg)     PHYSICAL EXAM General: NAD HEENT: Normal. Neck: No JVD, no thyromegaly. Lungs: Clear to auscultation bilaterally with normal respiratory effort. CV: Regular rate and rhythm, normal S1/S2, no S3/S4, no murmur.  1+ right leg and trace left leg edema.  No carotid bruit.   Abdomen: Soft, nontender, no distention.  Neurologic: Alert and oriented.  Psych: Normal affect. Skin: Normal. Musculoskeletal: No gross deformities.    ECG: Most recent ECG reviewed.   Labs: Lab Results  Component Value Date/Time   K 4.1 04/03/2011 01:52 AM   BUN 22 04/03/2011 01:52 AM   CREATININE 0.90 04/03/2011 01:52 AM   HGB 12.4 (L) 04/03/2011 01:52 AM     Lipids: No results found for: LDLCALC, LDLDIRECT, CHOL, TRIG, HDL     ASSESSMENT AND PLAN:  1. CAD with a history of CABG with progressive exertional dyspnea and angina and bilateral leg edema: Given that it has been 24 years since coronary artery bypass graft surgery, I suspect he has developed atherosclerotic plaque buildup.  I will proceed with a Lexiscan Myoview stress test to evaluate for significant ischemic territories.  I will also obtain an echocardiogram to evaluate cardiac structure and function.  I will also provide him with a prescription for nitroglycerin.  2. Essential  HTN: Controlled.   No changes to therapy.  3. Chronic diastolic heart failure:  He does have some bilateral leg edema.  The time being I will continue Lasix 40 mg daily.  I will obtain  an echocardiogram to evaluate cardiac structure and function to see if there has been a decline in LV performance.    Disposition: Follow up 6 weeks.   Kate Sable, M.D., F.A.C.C.

## 2017-04-14 NOTE — Patient Instructions (Signed)
Medication Instructions:   Begin Nitroglycerin as needed for severe chest pain only.   Continue all other medications.    Labwork: none  Testing/Procedures:  Your physician has requested that you have a lexiscan myoview. For further information please visit [email or web address]. Please follow instruction sheet, as given.  Your physician has requested that you have an echocardiogram. Echocardiography is a painless test that uses sound waves to create images of your heart. It provides your doctor with information about the size and shape of your heart and how well your heart's chambers and valves are working. This procedure takes approximately one hour. There are no restrictions for this procedure.  Office will contact with results via phone or letter.    Follow-Up: 6 weeks   Any Other Special Instructions Will Be Listed Below (If Applicable).  If you need a refill on your cardiac medications before your next appointment, please call your pharmacy.

## 2017-04-14 NOTE — Addendum Note (Signed)
Addended by: Laurine Blazer on: 04/14/2017 02:16 PM   Modules accepted: Orders

## 2017-04-20 ENCOUNTER — Ambulatory Visit (INDEPENDENT_AMBULATORY_CARE_PROVIDER_SITE_OTHER): Payer: Medicare Other

## 2017-04-20 ENCOUNTER — Other Ambulatory Visit: Payer: Self-pay

## 2017-04-20 DIAGNOSIS — I209 Angina pectoris, unspecified: Secondary | ICD-10-CM

## 2017-04-20 DIAGNOSIS — R0600 Dyspnea, unspecified: Secondary | ICD-10-CM

## 2017-04-20 DIAGNOSIS — R0609 Other forms of dyspnea: Secondary | ICD-10-CM

## 2017-04-22 ENCOUNTER — Telehealth: Payer: Self-pay | Admitting: *Deleted

## 2017-04-22 NOTE — Telephone Encounter (Signed)
Pt aware - routed to pcp  

## 2017-04-22 NOTE — Telephone Encounter (Signed)
-----   Message from Herminio Commons, MD sent at 04/20/2017  1:11 PM EST ----- Pumping function remains mildly reduced.

## 2017-04-28 DIAGNOSIS — E7800 Pure hypercholesterolemia, unspecified: Secondary | ICD-10-CM | POA: Diagnosis not present

## 2017-04-28 DIAGNOSIS — I2510 Atherosclerotic heart disease of native coronary artery without angina pectoris: Secondary | ICD-10-CM | POA: Diagnosis not present

## 2017-04-28 DIAGNOSIS — I10 Essential (primary) hypertension: Secondary | ICD-10-CM | POA: Diagnosis not present

## 2017-04-28 DIAGNOSIS — E119 Type 2 diabetes mellitus without complications: Secondary | ICD-10-CM | POA: Diagnosis not present

## 2017-04-29 ENCOUNTER — Encounter (HOSPITAL_COMMUNITY): Payer: Self-pay

## 2017-04-29 ENCOUNTER — Encounter (HOSPITAL_COMMUNITY)
Admission: RE | Admit: 2017-04-29 | Discharge: 2017-04-29 | Disposition: A | Discharge status: Home / Self Care | Payer: Medicare Other | Source: Ambulatory Visit | Attending: Cardiovascular Disease | Admitting: Cardiovascular Disease

## 2017-04-29 ENCOUNTER — Ambulatory Visit (HOSPITAL_COMMUNITY)
Admission: RE | Admit: 2017-04-29 | Discharge: 2017-04-29 | Disposition: A | Discharge status: Home / Self Care | Payer: Medicare Other | Source: Ambulatory Visit | Attending: Cardiovascular Disease | Admitting: Cardiovascular Disease

## 2017-04-29 DIAGNOSIS — I209 Angina pectoris, unspecified: Secondary | ICD-10-CM | POA: Diagnosis not present

## 2017-04-29 DIAGNOSIS — R0609 Other forms of dyspnea: Secondary | ICD-10-CM | POA: Diagnosis not present

## 2017-04-29 DIAGNOSIS — R9439 Abnormal result of other cardiovascular function study: Secondary | ICD-10-CM | POA: Insufficient documentation

## 2017-04-29 DIAGNOSIS — R0600 Dyspnea, unspecified: Secondary | ICD-10-CM

## 2017-04-29 LAB — NM MYOCAR MULTI W/SPECT W/WALL MOTION / EF
LHR: 0.52
LVDIAVOL: 126 mL (ref 62–150)
LVSYSVOL: 85 mL
NUC STRESS TID: 0.97
Peak HR: 93 {beats}/min
Rest HR: 68 {beats}/min
SDS: 1
SRS: 15
SSS: 16

## 2017-04-29 MED ORDER — TECHNETIUM TC 99M TETROFOSMIN IV KIT
30.0000 | PACK | Freq: Once | INTRAVENOUS | Status: AC | PRN
  Administered 2017-04-29: 32 via INTRAVENOUS

## 2017-04-29 MED ORDER — TECHNETIUM TC 99M TETROFOSMIN IV KIT
10.0000 | PACK | Freq: Once | INTRAVENOUS | Status: AC | PRN
  Administered 2017-04-29: 11 via INTRAVENOUS

## 2017-04-29 MED ORDER — SODIUM CHLORIDE 0.9% FLUSH
INTRAVENOUS | Status: AC
  Administered 2017-04-29: 10 mL via INTRAVENOUS
  Filled 2017-04-29: qty 10

## 2017-04-29 MED ORDER — REGADENOSON 0.4 MG/5ML IV SOLN
INTRAVENOUS | Status: AC
  Administered 2017-04-29: 0.4 mg via INTRAVENOUS
  Filled 2017-04-29: qty 5

## 2017-05-03 ENCOUNTER — Telehealth: Payer: Self-pay | Admitting: *Deleted

## 2017-05-03 NOTE — Telephone Encounter (Signed)
Notes recorded by Laurine Blazer, LPN on 07/26/1029 at 28:11 AM EST Patient notified. Copy to pmd. Follow up scheduled for 05/26/2017 with Dr. Bronson Ing.   ------  Notes recorded by Herminio Commons, MD on 05/02/2017 at 10:54 AM EST Evidence of prior heart attack seen. No new blockages.

## 2017-05-12 DIAGNOSIS — Z299 Encounter for prophylactic measures, unspecified: Secondary | ICD-10-CM | POA: Diagnosis not present

## 2017-05-12 DIAGNOSIS — I4891 Unspecified atrial fibrillation: Secondary | ICD-10-CM | POA: Diagnosis not present

## 2017-05-12 DIAGNOSIS — Z6829 Body mass index (BMI) 29.0-29.9, adult: Secondary | ICD-10-CM | POA: Diagnosis not present

## 2017-05-12 DIAGNOSIS — E1165 Type 2 diabetes mellitus with hyperglycemia: Secondary | ICD-10-CM | POA: Diagnosis not present

## 2017-05-12 DIAGNOSIS — I2510 Atherosclerotic heart disease of native coronary artery without angina pectoris: Secondary | ICD-10-CM | POA: Diagnosis not present

## 2017-05-12 DIAGNOSIS — B370 Candidal stomatitis: Secondary | ICD-10-CM | POA: Diagnosis not present

## 2017-05-26 ENCOUNTER — Encounter: Payer: Self-pay | Admitting: Cardiovascular Disease

## 2017-05-26 ENCOUNTER — Ambulatory Visit (INDEPENDENT_AMBULATORY_CARE_PROVIDER_SITE_OTHER): Payer: Medicare Other | Admitting: Cardiovascular Disease

## 2017-05-26 VITALS — BP 108/68 | HR 92 | Ht 71.0 in | Wt 207.0 lb

## 2017-05-26 DIAGNOSIS — R0609 Other forms of dyspnea: Secondary | ICD-10-CM

## 2017-05-26 DIAGNOSIS — I25708 Atherosclerosis of coronary artery bypass graft(s), unspecified, with other forms of angina pectoris: Secondary | ICD-10-CM

## 2017-05-26 DIAGNOSIS — R600 Localized edema: Secondary | ICD-10-CM | POA: Diagnosis not present

## 2017-05-26 DIAGNOSIS — I10 Essential (primary) hypertension: Secondary | ICD-10-CM | POA: Diagnosis not present

## 2017-05-26 DIAGNOSIS — I209 Angina pectoris, unspecified: Secondary | ICD-10-CM

## 2017-05-26 DIAGNOSIS — I5042 Chronic combined systolic (congestive) and diastolic (congestive) heart failure: Secondary | ICD-10-CM

## 2017-05-26 DIAGNOSIS — R0600 Dyspnea, unspecified: Secondary | ICD-10-CM

## 2017-05-26 MED ORDER — METOPROLOL SUCCINATE ER 25 MG PO TB24: 12.5000 mg | ORAL_TABLET | Freq: Every day | ORAL | 1 refills | Status: DC

## 2017-05-26 NOTE — Patient Instructions (Signed)
Your physician wants you to follow-up in: [place] will receive a reminder letter in the mail two months in advance. If you don't receive a letter, please call our office to schedule the follow-up appointment.  Your physician has recommended you make the following change in your medication:   START TOPROL XL 12.5 MG (1/2 TABLET) DAILY  Thank you for choosing [facility]!!

## 2017-05-26 NOTE — Progress Notes (Signed)
SUBJECTIVE: The patient presents for follow-up after undergoing cardiovascular testing.  He has a history of CABG in 08/06/1992. His last cath was in 08-06-01 and demonstrated patent grafts.  His wife of 19 years passed away in 08-07-14.  He has been having progressive exertional dyspnea.  Nuclear stress test on 04/29/17 showed large prior inferior infarction without ischemia.  Echocardiogram demonstrated mildly reduced left ventricular systolic function, LVEF 95-62%, mild LVH, diffuse hypokinesis, and grade 1 diastolic dysfunction with mild mitral regurgitation.  Since I prescribed Lasix at his last visit, bilateral leg edema has resolved.  He denies chest pain.  His primary complaint relates to episodic headaches primarily at night.  His daughter lives with him now.    Review of Systems: As per "subjective", otherwise negative.  Allergies  Allergen Reactions  . Sulfonamide Derivatives   . Tetracycline     Current Outpatient Medications  Medication Sig Dispense Refill  . alfuzosin (UROXATRAL) 10 MG 24 hr tablet Take 10 mg by mouth daily.    Marland Kitchen aspirin EC 81 MG tablet Take 1 tablet (81 mg total) by mouth daily.    . benazepril (LOTENSIN) 20 MG tablet Take 20 mg by mouth daily.      . citalopram (CELEXA) 20 MG tablet Take 20 mg by mouth daily.     . diazepam (VALIUM) 5 MG tablet Take 5 mg by mouth at bedtime as needed. For nerves     . finasteride (PROSCAR) 5 MG tablet Take 5 mg by mouth daily.    . furosemide (LASIX) 40 MG tablet Take 40 mg by mouth daily.    . metFORMIN (GLUCOPHAGE) 500 MG tablet Take 500 mg by mouth daily with breakfast.     . Multiple Vitamin (MULTIVITAMIN) tablet Take 1 tablet by mouth daily.      . nitroGLYCERIN (NITROSTAT) 0.4 MG SL tablet Place 1 tablet (0.4 mg total) under the tongue every 5 (five) minutes as needed for chest pain. 25 tablet 3  . Omega-3 Fatty Acids (FISH OIL) 1200 MG CAPS Take 1 capsule by mouth daily.      Marland Kitchen rOPINIRole (REQUIP) 1 MG tablet  Take 1 tab in AM and 2 PM    . simvastatin (ZOCOR) 40 MG tablet Take 1 tablet (40 mg total) by mouth at bedtime. 7 tablet 0  . traMADol (ULTRAM) 50 MG tablet Take 1 tablet by mouth daily as needed.     No current facility-administered medications for this visit.     Past Medical History:  Diagnosis Date  . Aortic insufficiency    Mild, echo, January, 2012  //   Mild, echo, February, 2014  . Asbestos exposure   . Bradycardia    MILD  . CAD (coronary artery disease) 08/06/2001   last catheterization 08-06-01, potential ischemia in the ramus, grafts patent  ( last nuclear 08-06-00)  . Carotid artery disease ([place]) 1999-08-07   BILATERAL 0-39%/  Follow with Dopplers by Dr. Woody Seller  . Colitis   . Diabetes mellitus   . Dizziness    He has had falls related to this / Chronic dizziness  . Dyslipidemia   . Ejection fraction    50%, echo, January, 2012, no mention of significant diastolic dysfunction  . Hx of CABG   . Hypertension   . Mitral regurgitation    Mild, echo, February, 2014  . Nephrolithiasis   . Pneumonia    left lower lobe  . PVD (peripheral vascular disease) ([place])   .  Renal cyst   . Shortness of breath    May, 2012  . SOB (shortness of breath)   . TIA (transient ischemic attack) 2007   ?? medical Rx     Past Surgical History:  Procedure Laterality Date  . CORONARY ARTERY BYPASS GRAFT    . percutaneous transluminal coronary angioplasty     hx    Social History   Socioeconomic History  . Marital status: Widowed    Spouse name: Not on file  . Number of children: 4  . Years of education: Not on file  . Highest education level: Not on file  Social Needs  . Financial resource strain: Not on file  . Food insecurity - worry: Not on file  . Food insecurity - inability: Not on file  . Transportation needs - medical: Not on file  . Transportation needs - non-medical: Not on file  Occupational History  . Occupation: RETIRED  Tobacco Use  . Smoking status: Former Smoker    Packs/day:  2.00    Years: 30.00    Pack years: 60.00    Types: Cigarettes    Start date: 12/23/1946    Last attempt to quit: 04/26/1980    Years since quitting: 37.1  . Smokeless tobacco: Never Used  Substance and Sexual Activity  . Alcohol use: Not on file  . Drug use: Not on file  . Sexual activity: Not on file  Other Topics Concern  . Not on file  Social History Narrative  . Not on file     Vitals:   05/26/17 1257  BP: 108/68  Pulse: 92  SpO2: 97%  Weight: 207 lb (93.9 kg)  Height: 5\' 11"  (1.803 m)    Wt Readings from Last 3 Encounters:  05/26/17 207 lb (93.9 kg)  04/14/17 212 lb (96.2 kg)  04/02/16 203 lb (92.1 kg)     PHYSICAL EXAM General: NAD HEENT: Normal. Neck: No JVD, no thyromegaly. Lungs: Clear to auscultation bilaterally with normal respiratory effort. CV: Regular rate and rhythm, normal S1/S2, no S3/S4, no murmur. No pretibial or periankle edema.   Abdomen: Soft, nontender, no distention.  Neurologic: Alert and oriented.  Psych: Normal affect. Skin: Normal. Musculoskeletal: No gross deformities.    ECG: Most recent ECG reviewed.   Labs: Lab Results  Component Value Date/Time   K 4.1 04/03/2011 01:52 AM   BUN 22 04/03/2011 01:52 AM   CREATININE 0.90 04/03/2011 01:52 AM   HGB 12.4 (L) 04/03/2011 01:52 AM     Lipids: No results found for: LDLCALC, LDLDIRECT, CHOL, TRIG, HDL     ASSESSMENT AND PLAN:  1. CAD with a history of CABG with progressive exertional dyspnea and angina and bilateral leg edema: Symptomatically improved.  Nuclear stress test results reviewed above with large area of inferior scar with no evidence of ischemia.  I will continue medical therapy with aspirin, benazepril, and simvastatin.  I will add metoprolol succinate 12.5 mg daily.  Heart rate currently in the 90 bpm range.  2. Essential hypertension: Controlled.    I will monitor given the addition of Toprol-XL 12.5 mg daily.  3. Chronic combined systolic and diastolic heart  failure, LVEF 45-50%: Stable.  Bilateral leg edema has resolved.  Weight is down 5 pounds since December.  Continue Lasix 40 mg daily.  He is on benazepril.   I will add metoprolol succinate 12.5 mg daily.     Disposition: Follow up 6 months   Kate Sable, M.D., F.A.C.C.

## 2017-06-14 DIAGNOSIS — L570 Actinic keratosis: Secondary | ICD-10-CM | POA: Diagnosis not present

## 2017-06-14 DIAGNOSIS — L0100 Impetigo, unspecified: Secondary | ICD-10-CM | POA: Diagnosis not present

## 2017-06-14 DIAGNOSIS — Z85828 Personal history of other malignant neoplasm of skin: Secondary | ICD-10-CM | POA: Diagnosis not present

## 2017-06-23 DIAGNOSIS — E119 Type 2 diabetes mellitus without complications: Secondary | ICD-10-CM | POA: Diagnosis not present

## 2017-06-23 DIAGNOSIS — E7800 Pure hypercholesterolemia, unspecified: Secondary | ICD-10-CM | POA: Diagnosis not present

## 2017-06-23 DIAGNOSIS — I10 Essential (primary) hypertension: Secondary | ICD-10-CM | POA: Diagnosis not present

## 2017-06-23 DIAGNOSIS — I2510 Atherosclerotic heart disease of native coronary artery without angina pectoris: Secondary | ICD-10-CM | POA: Diagnosis not present

## 2017-07-25 DIAGNOSIS — I10 Essential (primary) hypertension: Secondary | ICD-10-CM | POA: Diagnosis not present

## 2017-07-25 DIAGNOSIS — E7800 Pure hypercholesterolemia, unspecified: Secondary | ICD-10-CM | POA: Diagnosis not present

## 2017-07-25 DIAGNOSIS — I2510 Atherosclerotic heart disease of native coronary artery without angina pectoris: Secondary | ICD-10-CM | POA: Diagnosis not present

## 2017-07-25 DIAGNOSIS — E119 Type 2 diabetes mellitus without complications: Secondary | ICD-10-CM | POA: Diagnosis not present

## 2017-08-04 DIAGNOSIS — E119 Type 2 diabetes mellitus without complications: Secondary | ICD-10-CM | POA: Diagnosis not present

## 2017-08-04 DIAGNOSIS — Z7982 Long term (current) use of aspirin: Secondary | ICD-10-CM | POA: Diagnosis not present

## 2017-08-04 DIAGNOSIS — I252 Old myocardial infarction: Secondary | ICD-10-CM | POA: Diagnosis not present

## 2017-08-04 DIAGNOSIS — I10 Essential (primary) hypertension: Secondary | ICD-10-CM | POA: Diagnosis not present

## 2017-08-04 DIAGNOSIS — W1839XA Other fall on same level, initial encounter: Secondary | ICD-10-CM | POA: Diagnosis not present

## 2017-08-04 DIAGNOSIS — Z7984 Long term (current) use of oral hypoglycemic drugs: Secondary | ICD-10-CM | POA: Diagnosis not present

## 2017-08-04 DIAGNOSIS — Z87891 Personal history of nicotine dependence: Secondary | ICD-10-CM | POA: Diagnosis not present

## 2017-08-04 DIAGNOSIS — Z79899 Other long term (current) drug therapy: Secondary | ICD-10-CM | POA: Diagnosis not present

## 2017-08-04 DIAGNOSIS — Z833 Family history of diabetes mellitus: Secondary | ICD-10-CM | POA: Diagnosis not present

## 2017-08-04 DIAGNOSIS — S2232XA Fracture of one rib, left side, initial encounter for closed fracture: Secondary | ICD-10-CM | POA: Diagnosis not present

## 2017-08-04 DIAGNOSIS — E7800 Pure hypercholesterolemia, unspecified: Secondary | ICD-10-CM | POA: Diagnosis not present

## 2017-08-04 DIAGNOSIS — G2581 Restless legs syndrome: Secondary | ICD-10-CM | POA: Diagnosis not present

## 2017-08-04 DIAGNOSIS — R0602 Shortness of breath: Secondary | ICD-10-CM | POA: Diagnosis not present

## 2017-08-04 DIAGNOSIS — Z8249 Family history of ischemic heart disease and other diseases of the circulatory system: Secondary | ICD-10-CM | POA: Diagnosis not present

## 2017-08-04 DIAGNOSIS — F329 Major depressive disorder, single episode, unspecified: Secondary | ICD-10-CM | POA: Diagnosis not present

## 2017-08-04 DIAGNOSIS — Z8673 Personal history of transient ischemic attack (TIA), and cerebral infarction without residual deficits: Secondary | ICD-10-CM | POA: Diagnosis not present

## 2017-08-17 DIAGNOSIS — I4891 Unspecified atrial fibrillation: Secondary | ICD-10-CM | POA: Diagnosis not present

## 2017-08-17 DIAGNOSIS — M79606 Pain in leg, unspecified: Secondary | ICD-10-CM | POA: Diagnosis not present

## 2017-08-17 DIAGNOSIS — G4700 Insomnia, unspecified: Secondary | ICD-10-CM | POA: Diagnosis not present

## 2017-08-17 DIAGNOSIS — Z6829 Body mass index (BMI) 29.0-29.9, adult: Secondary | ICD-10-CM | POA: Diagnosis not present

## 2017-08-17 DIAGNOSIS — Z299 Encounter for prophylactic measures, unspecified: Secondary | ICD-10-CM | POA: Diagnosis not present

## 2017-08-17 DIAGNOSIS — I10 Essential (primary) hypertension: Secondary | ICD-10-CM | POA: Diagnosis not present

## 2017-08-17 DIAGNOSIS — I2510 Atherosclerotic heart disease of native coronary artery without angina pectoris: Secondary | ICD-10-CM | POA: Diagnosis not present

## 2017-08-17 DIAGNOSIS — E1165 Type 2 diabetes mellitus with hyperglycemia: Secondary | ICD-10-CM | POA: Diagnosis not present

## 2017-08-29 DIAGNOSIS — E7800 Pure hypercholesterolemia, unspecified: Secondary | ICD-10-CM | POA: Diagnosis not present

## 2017-08-29 DIAGNOSIS — E119 Type 2 diabetes mellitus without complications: Secondary | ICD-10-CM | POA: Diagnosis not present

## 2017-08-29 DIAGNOSIS — I10 Essential (primary) hypertension: Secondary | ICD-10-CM | POA: Diagnosis not present

## 2017-08-29 DIAGNOSIS — I2510 Atherosclerotic heart disease of native coronary artery without angina pectoris: Secondary | ICD-10-CM | POA: Diagnosis not present

## 2017-10-13 DIAGNOSIS — I10 Essential (primary) hypertension: Secondary | ICD-10-CM | POA: Diagnosis not present

## 2017-10-13 DIAGNOSIS — E119 Type 2 diabetes mellitus without complications: Secondary | ICD-10-CM | POA: Diagnosis not present

## 2017-10-13 DIAGNOSIS — E7800 Pure hypercholesterolemia, unspecified: Secondary | ICD-10-CM | POA: Diagnosis not present

## 2017-10-13 DIAGNOSIS — I2510 Atherosclerotic heart disease of native coronary artery without angina pectoris: Secondary | ICD-10-CM | POA: Diagnosis not present

## 2017-10-26 ENCOUNTER — Other Ambulatory Visit: Payer: Self-pay | Admitting: Cardiovascular Disease

## 2017-11-17 DIAGNOSIS — I10 Essential (primary) hypertension: Secondary | ICD-10-CM | POA: Diagnosis not present

## 2017-11-17 DIAGNOSIS — I2510 Atherosclerotic heart disease of native coronary artery without angina pectoris: Secondary | ICD-10-CM | POA: Diagnosis not present

## 2017-11-17 DIAGNOSIS — E119 Type 2 diabetes mellitus without complications: Secondary | ICD-10-CM | POA: Diagnosis not present

## 2017-11-17 DIAGNOSIS — E7800 Pure hypercholesterolemia, unspecified: Secondary | ICD-10-CM | POA: Diagnosis not present

## 2017-11-23 DIAGNOSIS — E1165 Type 2 diabetes mellitus with hyperglycemia: Secondary | ICD-10-CM | POA: Diagnosis not present

## 2017-11-23 DIAGNOSIS — I4891 Unspecified atrial fibrillation: Secondary | ICD-10-CM | POA: Diagnosis not present

## 2017-11-23 DIAGNOSIS — I10 Essential (primary) hypertension: Secondary | ICD-10-CM | POA: Diagnosis not present

## 2017-11-23 DIAGNOSIS — M79606 Pain in leg, unspecified: Secondary | ICD-10-CM | POA: Diagnosis not present

## 2017-11-23 DIAGNOSIS — N400 Enlarged prostate without lower urinary tract symptoms: Secondary | ICD-10-CM | POA: Diagnosis not present

## 2017-11-23 DIAGNOSIS — Z6829 Body mass index (BMI) 29.0-29.9, adult: Secondary | ICD-10-CM | POA: Diagnosis not present

## 2017-11-23 DIAGNOSIS — Z299 Encounter for prophylactic measures, unspecified: Secondary | ICD-10-CM | POA: Diagnosis not present

## 2017-11-23 DIAGNOSIS — I2510 Atherosclerotic heart disease of native coronary artery without angina pectoris: Secondary | ICD-10-CM | POA: Diagnosis not present

## 2017-11-25 ENCOUNTER — Ambulatory Visit (INDEPENDENT_AMBULATORY_CARE_PROVIDER_SITE_OTHER): Payer: Medicare Other | Admitting: Cardiovascular Disease

## 2017-11-25 ENCOUNTER — Encounter: Payer: Self-pay | Admitting: Cardiovascular Disease

## 2017-11-25 VITALS — BP 107/64 | HR 74 | Ht 71.0 in | Wt 196.6 lb

## 2017-11-25 DIAGNOSIS — I209 Angina pectoris, unspecified: Secondary | ICD-10-CM | POA: Diagnosis not present

## 2017-11-25 DIAGNOSIS — I10 Essential (primary) hypertension: Secondary | ICD-10-CM

## 2017-11-25 DIAGNOSIS — I5042 Chronic combined systolic (congestive) and diastolic (congestive) heart failure: Secondary | ICD-10-CM

## 2017-11-25 DIAGNOSIS — I25708 Atherosclerosis of coronary artery bypass graft(s), unspecified, with other forms of angina pectoris: Secondary | ICD-10-CM | POA: Diagnosis not present

## 2017-11-25 DIAGNOSIS — R600 Localized edema: Secondary | ICD-10-CM | POA: Diagnosis not present

## 2017-11-25 NOTE — Progress Notes (Signed)
SUBJECTIVE: The patient presents for routine follow-up. He has a history of CABG in 1992/08/05. His last cath was in 08-05-01 and demonstrated patent grafts.  Nuclear stress test on 04/29/17 showed large prior inferior infarction without ischemia.  Echocardiogram demonstrated mildly reduced left ventricular systolic function, LVEF 86-57%, mild LVH, diffuse hypokinesis, and grade 1 diastolic dysfunction with mild mitral regurgitation.  He stays active outdoors.  He denies chest pain, palpitations, leg swelling, and shortness of breath.  He is here with his daughter who is 42 years old.  She cooks for him but says he is also able to cook for himself.    Social history: His wife of 37 years passed away in 08/06/2014.  His daughter lives with him now.   Review of Systems: As per "subjective", otherwise negative.  Allergies  Allergen Reactions  . Sulfonamide Derivatives   . Tetracycline     Current Outpatient Medications  Medication Sig Dispense Refill  . alfuzosin (UROXATRAL) 10 MG 24 hr tablet Take 10 mg by mouth daily.    Marland Kitchen aspirin EC 81 MG tablet Take 1 tablet (81 mg total) by mouth daily.    Marland Kitchen atorvastatin (LIPITOR) 20 MG tablet Take 20 mg by mouth daily.  4  . benazepril (LOTENSIN) 20 MG tablet Take 20 mg by mouth daily.      . cetirizine (ZYRTEC) 10 MG tablet Take 1 tablet by mouth daily.  5  . citalopram (CELEXA) 20 MG tablet Take 20 mg by mouth daily.     . diazepam (VALIUM) 5 MG tablet Take 5 mg by mouth at bedtime as needed. For nerves     . finasteride (PROSCAR) 5 MG tablet Take 5 mg by mouth daily.    . furosemide (LASIX) 40 MG tablet Take 40 mg by mouth daily.    . metFORMIN (GLUCOPHAGE) 500 MG tablet Take 500 mg by mouth daily with breakfast.     . metoprolol succinate (TOPROL-XL) 25 MG 24 hr tablet take ONE-HALF tab BY MOUTH DAILY 45 tablet 1  . Misc Natural Products (OSTEO BI-FLEX TRIPLE STRENGTH PO) Take 1 capsule by mouth daily.    . Multiple Vitamin (MULTIVITAMIN) tablet  Take 1 tablet by mouth daily.      . nitroGLYCERIN (NITROSTAT) 0.4 MG SL tablet Place 1 tablet (0.4 mg total) under the tongue every 5 (five) minutes as needed for chest pain. 25 tablet 3  . Omega-3 Fatty Acids (FISH OIL) 1200 MG CAPS Take 1 capsule by mouth daily.      . ranitidine (ZANTAC) 150 MG tablet Take 150 mg by mouth 2 (two) times daily.    Marland Kitchen rOPINIRole (REQUIP) 1 MG tablet Take 1 tab in AM and 2 PM    . traMADol (ULTRAM) 50 MG tablet Take 1 tablet by mouth daily as needed.    . vitamin C (ASCORBIC ACID) 500 MG tablet Take 500 mg by mouth daily.     No current facility-administered medications for this visit.     Past Medical History:  Diagnosis Date  . Aortic insufficiency    Mild, echo, January, 2012  //   Mild, echo, February, 2014  . Asbestos exposure   . Bradycardia    MILD  . CAD (coronary artery disease) 05-Aug-2001   last catheterization August 05, 2001, potential ischemia in the ramus, grafts patent  ( last nuclear 08/05/2000)  . Carotid artery disease ([place]) 1999-08-06   BILATERAL 0-39%/  Follow with Dopplers by Dr. Woody Seller  . Colitis   .  Diabetes mellitus   . Dizziness    He has had falls related to this / Chronic dizziness  . Dyslipidemia   . Ejection fraction    50%, echo, January, 2012, no mention of significant diastolic dysfunction  . Hx of CABG   . Hypertension   . Mitral regurgitation    Mild, echo, February, 2014  . Nephrolithiasis   . Pneumonia    left lower lobe  . PVD (peripheral vascular disease) ([place])   . Renal cyst   . Shortness of breath    May, 2012  . SOB (shortness of breath)   . TIA (transient ischemic attack) 2007   ?? medical Rx     Past Surgical History:  Procedure Laterality Date  . CORONARY ARTERY BYPASS GRAFT    . percutaneous transluminal coronary angioplasty     hx    Social History   Socioeconomic History  . Marital status: Widowed    Spouse name: Not on file  . Number of children: 4  . Years of education: Not on file  . Highest education  level: Not on file  Occupational History  . Occupation: RETIRED  Social Needs  . Financial resource strain: Not on file  . Food insecurity:    Worry: Not on file    Inability: Not on file  . Transportation needs:    Medical: Not on file    Non-medical: Not on file  Tobacco Use  . Smoking status: Former Smoker    Packs/day: 2.00    Years: 30.00    Pack years: 60.00    Types: Cigarettes    Start date: 12/23/1946    Last attempt to quit: 04/26/1980    Years since quitting: 37.6  . Smokeless tobacco: Never Used  Substance and Sexual Activity  . Alcohol use: Not on file  . Drug use: Not on file  . Sexual activity: Not on file  Lifestyle  . Physical activity:    Days per week: Not on file    Minutes per session: Not on file  . Stress: Not on file  Relationships  . Social connections:    Talks on phone: Not on file    Gets together: Not on file    Attends religious service: Not on file    Active member of club or organization: Not on file    Attends meetings of clubs or organizations: Not on file    Relationship status: Not on file  . Intimate partner violence:    Fear of current or ex partner: Not on file    Emotionally abused: Not on file    Physically abused: Not on file    Forced sexual activity: Not on file  Other Topics Concern  . Not on file  Social History Narrative  . Not on file     Vitals:   11/25/17 1500  BP: 107/64  Pulse: 74  SpO2: 96%  Weight: 196 lb 9.6 oz (89.2 kg)  Height: 5\' 11"  (1.803 m)    Wt Readings from Last 3 Encounters:  11/25/17 196 lb 9.6 oz (89.2 kg)  05/26/17 207 lb (93.9 kg)  04/14/17 212 lb (96.2 kg)     PHYSICAL EXAM General: NAD HEENT: Normal. Neck: No JVD, no thyromegaly. Lungs: Clear to auscultation bilaterally with normal respiratory effort. CV: Regular rate and rhythm, normal S1/S2, no S3/S4, no murmur. No pretibial or periankle edema.  No carotid bruit.   Abdomen: Soft, nontender, no distention.  Neurologic: Alert  and oriented.  Psych: Normal affect. Skin: Normal. Musculoskeletal: No gross deformities.    ECG: Reviewed above under Subjective   Labs: Lab Results  Component Value Date/Time   K 4.1 04/03/2011 01:52 AM   BUN 22 04/03/2011 01:52 AM   CREATININE 0.90 04/03/2011 01:52 AM   HGB 12.4 (L) 04/03/2011 01:52 AM     Lipids: No results found for: LDLCALC, LDLDIRECT, CHOL, TRIG, HDL     ASSESSMENT AND PLAN:  1. CADwith a history of CABG: Symptomatically stable.  Weight down 11 pounds since 05/26/2017. Nuclear stress test results reviewed above with large area of inferior scar with no evidence of ischemia.  I will continue medical therapy with aspirin, benazepril, Toprol-XL 12.5 mg daily, and simvastatin.  2. Essential hypertension: Controlled. No changes to therapy.  3. Chronic combined systolic and diastolic heart failure, LVEF 45-50%:Stable. Continue Lasix 40 mg daily along with benazepril and metoprolol succinate.    Disposition: Follow up 6 months   Kate Sable, M.D., F.A.C.C.

## 2017-11-25 NOTE — Patient Instructions (Signed)

## 2017-12-05 DIAGNOSIS — S20211A Contusion of right front wall of thorax, initial encounter: Secondary | ICD-10-CM | POA: Diagnosis not present

## 2017-12-05 DIAGNOSIS — M25461 Effusion, right knee: Secondary | ICD-10-CM | POA: Diagnosis not present

## 2017-12-05 DIAGNOSIS — Z7984 Long term (current) use of oral hypoglycemic drugs: Secondary | ICD-10-CM | POA: Diagnosis not present

## 2017-12-05 DIAGNOSIS — M79631 Pain in right forearm: Secondary | ICD-10-CM | POA: Diagnosis not present

## 2017-12-05 DIAGNOSIS — S59911A Unspecified injury of right forearm, initial encounter: Secondary | ICD-10-CM | POA: Diagnosis not present

## 2017-12-05 DIAGNOSIS — R0781 Pleurodynia: Secondary | ICD-10-CM | POA: Diagnosis not present

## 2017-12-05 DIAGNOSIS — Z79899 Other long term (current) drug therapy: Secondary | ICD-10-CM | POA: Diagnosis not present

## 2017-12-05 DIAGNOSIS — W0110XA Fall on same level from slipping, tripping and stumbling with subsequent striking against unspecified object, initial encounter: Secondary | ICD-10-CM | POA: Diagnosis not present

## 2017-12-05 DIAGNOSIS — Z7982 Long term (current) use of aspirin: Secondary | ICD-10-CM | POA: Diagnosis not present

## 2017-12-05 DIAGNOSIS — M25561 Pain in right knee: Secondary | ICD-10-CM | POA: Diagnosis not present

## 2017-12-05 DIAGNOSIS — S5011XA Contusion of right forearm, initial encounter: Secondary | ICD-10-CM | POA: Diagnosis not present

## 2017-12-05 DIAGNOSIS — G2581 Restless legs syndrome: Secondary | ICD-10-CM | POA: Diagnosis not present

## 2017-12-05 DIAGNOSIS — S299XXA Unspecified injury of thorax, initial encounter: Secondary | ICD-10-CM | POA: Diagnosis not present

## 2017-12-05 DIAGNOSIS — Z888 Allergy status to other drugs, medicaments and biological substances status: Secondary | ICD-10-CM | POA: Diagnosis not present

## 2017-12-05 DIAGNOSIS — E119 Type 2 diabetes mellitus without complications: Secondary | ICD-10-CM | POA: Diagnosis not present

## 2017-12-07 DIAGNOSIS — L570 Actinic keratosis: Secondary | ICD-10-CM | POA: Diagnosis not present

## 2017-12-13 DIAGNOSIS — E7800 Pure hypercholesterolemia, unspecified: Secondary | ICD-10-CM | POA: Diagnosis not present

## 2017-12-13 DIAGNOSIS — E119 Type 2 diabetes mellitus without complications: Secondary | ICD-10-CM | POA: Diagnosis not present

## 2017-12-13 DIAGNOSIS — I2510 Atherosclerotic heart disease of native coronary artery without angina pectoris: Secondary | ICD-10-CM | POA: Diagnosis not present

## 2017-12-13 DIAGNOSIS — I10 Essential (primary) hypertension: Secondary | ICD-10-CM | POA: Diagnosis not present

## 2017-12-19 DIAGNOSIS — Z6828 Body mass index (BMI) 28.0-28.9, adult: Secondary | ICD-10-CM | POA: Diagnosis not present

## 2017-12-19 DIAGNOSIS — Z1331 Encounter for screening for depression: Secondary | ICD-10-CM | POA: Diagnosis not present

## 2017-12-19 DIAGNOSIS — Z1211 Encounter for screening for malignant neoplasm of colon: Secondary | ICD-10-CM | POA: Diagnosis not present

## 2017-12-19 DIAGNOSIS — R5383 Other fatigue: Secondary | ICD-10-CM | POA: Diagnosis not present

## 2017-12-19 DIAGNOSIS — E7800 Pure hypercholesterolemia, unspecified: Secondary | ICD-10-CM | POA: Diagnosis not present

## 2017-12-19 DIAGNOSIS — Z1339 Encounter for screening examination for other mental health and behavioral disorders: Secondary | ICD-10-CM | POA: Diagnosis not present

## 2017-12-19 DIAGNOSIS — Z87891 Personal history of nicotine dependence: Secondary | ICD-10-CM | POA: Diagnosis not present

## 2017-12-19 DIAGNOSIS — I10 Essential (primary) hypertension: Secondary | ICD-10-CM | POA: Diagnosis not present

## 2017-12-19 DIAGNOSIS — Z0000 Encounter for general adult medical examination without abnormal findings: Secondary | ICD-10-CM | POA: Diagnosis not present

## 2017-12-19 DIAGNOSIS — Z299 Encounter for prophylactic measures, unspecified: Secondary | ICD-10-CM | POA: Diagnosis not present

## 2017-12-19 DIAGNOSIS — Z7189 Other specified counseling: Secondary | ICD-10-CM | POA: Diagnosis not present

## 2017-12-19 DIAGNOSIS — I4891 Unspecified atrial fibrillation: Secondary | ICD-10-CM | POA: Diagnosis not present

## 2017-12-20 DIAGNOSIS — R5383 Other fatigue: Secondary | ICD-10-CM | POA: Diagnosis not present

## 2017-12-20 DIAGNOSIS — Z79899 Other long term (current) drug therapy: Secondary | ICD-10-CM | POA: Diagnosis not present

## 2017-12-20 DIAGNOSIS — Z125 Encounter for screening for malignant neoplasm of prostate: Secondary | ICD-10-CM | POA: Diagnosis not present

## 2017-12-20 DIAGNOSIS — E7800 Pure hypercholesterolemia, unspecified: Secondary | ICD-10-CM | POA: Diagnosis not present

## 2017-12-27 ENCOUNTER — Other Ambulatory Visit: Payer: Self-pay | Admitting: *Deleted

## 2017-12-27 MED ORDER — METOPROLOL SUCCINATE ER 25 MG PO TB24: ORAL_TABLET | ORAL | 2 refills | Status: DC

## 2018-01-10 DIAGNOSIS — I2510 Atherosclerotic heart disease of native coronary artery without angina pectoris: Secondary | ICD-10-CM | POA: Diagnosis not present

## 2018-01-10 DIAGNOSIS — E7800 Pure hypercholesterolemia, unspecified: Secondary | ICD-10-CM | POA: Diagnosis not present

## 2018-01-10 DIAGNOSIS — I10 Essential (primary) hypertension: Secondary | ICD-10-CM | POA: Diagnosis not present

## 2018-01-10 DIAGNOSIS — E119 Type 2 diabetes mellitus without complications: Secondary | ICD-10-CM | POA: Diagnosis not present

## 2018-01-17 ENCOUNTER — Ambulatory Visit (INDEPENDENT_AMBULATORY_CARE_PROVIDER_SITE_OTHER): Payer: Medicare Other | Admitting: Urology

## 2018-01-17 DIAGNOSIS — N401 Enlarged prostate with lower urinary tract symptoms: Secondary | ICD-10-CM

## 2018-01-17 DIAGNOSIS — R3912 Poor urinary stream: Secondary | ICD-10-CM | POA: Diagnosis not present

## 2018-01-17 DIAGNOSIS — R351 Nocturia: Secondary | ICD-10-CM | POA: Diagnosis not present

## 2018-02-04 DIAGNOSIS — S0101XA Laceration without foreign body of scalp, initial encounter: Secondary | ICD-10-CM | POA: Diagnosis not present

## 2018-02-04 DIAGNOSIS — W1839XA Other fall on same level, initial encounter: Secondary | ICD-10-CM | POA: Diagnosis not present

## 2018-02-04 DIAGNOSIS — Z882 Allergy status to sulfonamides status: Secondary | ICD-10-CM | POA: Diagnosis not present

## 2018-02-04 DIAGNOSIS — Z7984 Long term (current) use of oral hypoglycemic drugs: Secondary | ICD-10-CM | POA: Diagnosis not present

## 2018-02-04 DIAGNOSIS — S0191XA Laceration without foreign body of unspecified part of head, initial encounter: Secondary | ICD-10-CM | POA: Diagnosis not present

## 2018-02-04 DIAGNOSIS — S199XXA Unspecified injury of neck, initial encounter: Secondary | ICD-10-CM | POA: Diagnosis not present

## 2018-02-04 DIAGNOSIS — Z79899 Other long term (current) drug therapy: Secondary | ICD-10-CM | POA: Diagnosis not present

## 2018-02-04 DIAGNOSIS — E7800 Pure hypercholesterolemia, unspecified: Secondary | ICD-10-CM | POA: Diagnosis not present

## 2018-02-04 DIAGNOSIS — S0990XA Unspecified injury of head, initial encounter: Secondary | ICD-10-CM | POA: Diagnosis not present

## 2018-02-04 DIAGNOSIS — S0003XA Contusion of scalp, initial encounter: Secondary | ICD-10-CM | POA: Diagnosis not present

## 2018-02-04 DIAGNOSIS — I10 Essential (primary) hypertension: Secondary | ICD-10-CM | POA: Diagnosis not present

## 2018-02-04 DIAGNOSIS — Z87891 Personal history of nicotine dependence: Secondary | ICD-10-CM | POA: Diagnosis not present

## 2018-02-14 DIAGNOSIS — I10 Essential (primary) hypertension: Secondary | ICD-10-CM | POA: Diagnosis not present

## 2018-02-14 DIAGNOSIS — S0100XA Unspecified open wound of scalp, initial encounter: Secondary | ICD-10-CM | POA: Diagnosis not present

## 2018-02-14 DIAGNOSIS — Z6828 Body mass index (BMI) 28.0-28.9, adult: Secondary | ICD-10-CM | POA: Diagnosis not present

## 2018-02-14 DIAGNOSIS — Z299 Encounter for prophylactic measures, unspecified: Secondary | ICD-10-CM | POA: Diagnosis not present

## 2018-02-14 DIAGNOSIS — I4891 Unspecified atrial fibrillation: Secondary | ICD-10-CM | POA: Diagnosis not present

## 2018-02-15 DIAGNOSIS — Z23 Encounter for immunization: Secondary | ICD-10-CM | POA: Diagnosis not present

## 2018-02-15 DIAGNOSIS — Z4801 Encounter for change or removal of surgical wound dressing: Secondary | ICD-10-CM | POA: Diagnosis not present

## 2018-02-21 DIAGNOSIS — R5383 Other fatigue: Secondary | ICD-10-CM | POA: Diagnosis not present

## 2018-02-23 ENCOUNTER — Ambulatory Visit (INDEPENDENT_AMBULATORY_CARE_PROVIDER_SITE_OTHER): Payer: Medicare Other | Admitting: Otolaryngology

## 2018-02-23 ENCOUNTER — Other Ambulatory Visit (INDEPENDENT_AMBULATORY_CARE_PROVIDER_SITE_OTHER): Payer: Self-pay | Admitting: Otolaryngology

## 2018-02-23 DIAGNOSIS — I2510 Atherosclerotic heart disease of native coronary artery without angina pectoris: Secondary | ICD-10-CM | POA: Diagnosis not present

## 2018-02-23 DIAGNOSIS — R1312 Dysphagia, oropharyngeal phase: Secondary | ICD-10-CM

## 2018-02-23 DIAGNOSIS — E7800 Pure hypercholesterolemia, unspecified: Secondary | ICD-10-CM | POA: Diagnosis not present

## 2018-02-23 DIAGNOSIS — R490 Dysphonia: Secondary | ICD-10-CM

## 2018-02-23 DIAGNOSIS — E119 Type 2 diabetes mellitus without complications: Secondary | ICD-10-CM | POA: Diagnosis not present

## 2018-02-23 DIAGNOSIS — I10 Essential (primary) hypertension: Secondary | ICD-10-CM | POA: Diagnosis not present

## 2018-02-28 ENCOUNTER — Ambulatory Visit (HOSPITAL_COMMUNITY)
Admission: RE | Admit: 2018-02-28 | Discharge: 2018-02-28 | Disposition: A | Discharge status: Home / Self Care | Payer: Medicare Other | Source: Ambulatory Visit | Attending: Otolaryngology | Admitting: Otolaryngology

## 2018-02-28 ENCOUNTER — Ambulatory Visit (INDEPENDENT_AMBULATORY_CARE_PROVIDER_SITE_OTHER): Payer: Medicare Other | Admitting: Urology

## 2018-02-28 DIAGNOSIS — R1312 Dysphagia, oropharyngeal phase: Secondary | ICD-10-CM | POA: Diagnosis not present

## 2018-02-28 DIAGNOSIS — R350 Frequency of micturition: Secondary | ICD-10-CM | POA: Diagnosis not present

## 2018-02-28 DIAGNOSIS — K224 Dyskinesia of esophagus: Secondary | ICD-10-CM | POA: Insufficient documentation

## 2018-02-28 DIAGNOSIS — R1310 Dysphagia, unspecified: Secondary | ICD-10-CM | POA: Diagnosis not present

## 2018-02-28 DIAGNOSIS — N401 Enlarged prostate with lower urinary tract symptoms: Secondary | ICD-10-CM | POA: Diagnosis not present

## 2018-03-01 DIAGNOSIS — R42 Dizziness and giddiness: Secondary | ICD-10-CM | POA: Diagnosis not present

## 2018-03-01 DIAGNOSIS — I10 Essential (primary) hypertension: Secondary | ICD-10-CM | POA: Diagnosis not present

## 2018-03-01 DIAGNOSIS — E1165 Type 2 diabetes mellitus with hyperglycemia: Secondary | ICD-10-CM | POA: Diagnosis not present

## 2018-03-01 DIAGNOSIS — Z299 Encounter for prophylactic measures, unspecified: Secondary | ICD-10-CM | POA: Diagnosis not present

## 2018-03-01 DIAGNOSIS — I2510 Atherosclerotic heart disease of native coronary artery without angina pectoris: Secondary | ICD-10-CM | POA: Diagnosis not present

## 2018-03-01 DIAGNOSIS — I4891 Unspecified atrial fibrillation: Secondary | ICD-10-CM | POA: Diagnosis not present

## 2018-03-01 DIAGNOSIS — Z6829 Body mass index (BMI) 29.0-29.9, adult: Secondary | ICD-10-CM | POA: Diagnosis not present

## 2018-03-21 DIAGNOSIS — I2510 Atherosclerotic heart disease of native coronary artery without angina pectoris: Secondary | ICD-10-CM | POA: Diagnosis not present

## 2018-03-21 DIAGNOSIS — E7800 Pure hypercholesterolemia, unspecified: Secondary | ICD-10-CM | POA: Diagnosis not present

## 2018-03-21 DIAGNOSIS — I10 Essential (primary) hypertension: Secondary | ICD-10-CM | POA: Diagnosis not present

## 2018-03-21 DIAGNOSIS — E119 Type 2 diabetes mellitus without complications: Secondary | ICD-10-CM | POA: Diagnosis not present

## 2018-03-28 DIAGNOSIS — E1140 Type 2 diabetes mellitus with diabetic neuropathy, unspecified: Secondary | ICD-10-CM | POA: Diagnosis not present

## 2018-03-28 DIAGNOSIS — E1159 Type 2 diabetes mellitus with other circulatory complications: Secondary | ICD-10-CM | POA: Diagnosis not present

## 2018-03-28 DIAGNOSIS — H81393 Other peripheral vertigo, bilateral: Secondary | ICD-10-CM | POA: Diagnosis not present

## 2018-04-10 DIAGNOSIS — R42 Dizziness and giddiness: Secondary | ICD-10-CM | POA: Diagnosis not present

## 2018-04-10 DIAGNOSIS — I10 Essential (primary) hypertension: Secondary | ICD-10-CM | POA: Diagnosis not present

## 2018-04-10 DIAGNOSIS — Z299 Encounter for prophylactic measures, unspecified: Secondary | ICD-10-CM | POA: Diagnosis not present

## 2018-04-10 DIAGNOSIS — Z6829 Body mass index (BMI) 29.0-29.9, adult: Secondary | ICD-10-CM | POA: Diagnosis not present

## 2018-05-01 DIAGNOSIS — R5383 Other fatigue: Secondary | ICD-10-CM | POA: Diagnosis not present

## 2018-05-04 DIAGNOSIS — E119 Type 2 diabetes mellitus without complications: Secondary | ICD-10-CM | POA: Diagnosis not present

## 2018-05-04 DIAGNOSIS — I2510 Atherosclerotic heart disease of native coronary artery without angina pectoris: Secondary | ICD-10-CM | POA: Diagnosis not present

## 2018-05-04 DIAGNOSIS — I10 Essential (primary) hypertension: Secondary | ICD-10-CM | POA: Diagnosis not present

## 2018-05-04 DIAGNOSIS — E7800 Pure hypercholesterolemia, unspecified: Secondary | ICD-10-CM | POA: Diagnosis not present

## 2018-05-09 DIAGNOSIS — L111 Transient acantholytic dermatosis [Grover]: Secondary | ICD-10-CM | POA: Diagnosis not present

## 2018-05-18 ENCOUNTER — Ambulatory Visit: Payer: Medicare Other | Admitting: Cardiovascular Disease

## 2018-05-18 ENCOUNTER — Encounter: Payer: Self-pay | Admitting: Cardiovascular Disease

## 2018-05-18 ENCOUNTER — Ambulatory Visit (INDEPENDENT_AMBULATORY_CARE_PROVIDER_SITE_OTHER): Payer: Medicare Other | Admitting: Cardiovascular Disease

## 2018-05-18 ENCOUNTER — Telehealth: Payer: Self-pay | Admitting: Cardiovascular Disease

## 2018-05-18 ENCOUNTER — Encounter: Payer: Self-pay | Admitting: *Deleted

## 2018-05-18 VITALS — BP 122/72 | HR 95 | Ht 71.0 in | Wt 204.0 lb

## 2018-05-18 DIAGNOSIS — I25708 Atherosclerosis of coronary artery bypass graft(s), unspecified, with other forms of angina pectoris: Secondary | ICD-10-CM

## 2018-05-18 DIAGNOSIS — R0600 Dyspnea, unspecified: Secondary | ICD-10-CM

## 2018-05-18 DIAGNOSIS — I5042 Chronic combined systolic (congestive) and diastolic (congestive) heart failure: Secondary | ICD-10-CM

## 2018-05-18 DIAGNOSIS — I10 Essential (primary) hypertension: Secondary | ICD-10-CM | POA: Diagnosis not present

## 2018-05-18 DIAGNOSIS — R0609 Other forms of dyspnea: Secondary | ICD-10-CM | POA: Diagnosis not present

## 2018-05-18 MED ORDER — BENAZEPRIL HCL 10 MG PO TABS: 10.0000 mg | ORAL_TABLET | Freq: Every day | ORAL | 6 refills | Status: DC

## 2018-05-18 MED ORDER — METOPROLOL SUCCINATE ER 25 MG PO TB24: 25.0000 mg | ORAL_TABLET | Freq: Every day | ORAL | 6 refills | Status: DC

## 2018-05-18 NOTE — Telephone Encounter (Signed)
Pre-cert Verification for the following procedure   Lexiscan scheduled for 06/01/2018 at [facility]

## 2018-05-18 NOTE — Progress Notes (Signed)
SUBJECTIVE:  The patient presents for routine follow-up. He has a history of CABG in 1992-08-17. His last cath was in 08/17/01 and demonstrated patent grafts.  Nuclear stress test on 04/29/17 showed large prior inferior infarction without ischemia.  Echocardiogram demonstrated mildly reduced left ventricular systolic function, LVEF 93-79%, mild LVH, diffuse hypokinesis, and grade 1 diastolic dysfunction with mild mitral regurgitation.  ECG performed in the office today which I ordered and personally interpreted demonstrates sinus rhythm with first-degree AV block, old inferior and anterolateral infarct, nonspecific intraventricular conduction delay, and diffuse T wave abnormalities.  I compared this ECG to his most recent one on 04/14/2017 and the T wave inversions are slightly more pronounced.  For the past few months he has had significant exertional dyspnea.  He said it now takes him an hour to get dressed.  He denies chest pain.  Symptoms at the time of CABG were pain in both elbows and arms.  He denies any similar symptoms.  His daughter says his appetite is very good.  He does have issues with balance and has knee problems.   Social history: His wife of 17 years passed away in Aug 18, 2014.  His daughter lives with him now.  Review of Systems: As per "subjective", otherwise negative.  Allergies  Allergen Reactions  . Sulfonamide Derivatives   . Tetracycline     Current Outpatient Medications  Medication Sig Dispense Refill  . aspirin EC 81 MG tablet Take 1 tablet (81 mg total) by mouth daily.    Marland Kitchen atorvastatin (LIPITOR) 20 MG tablet Take 20 mg by mouth daily.  4  . benazepril (LOTENSIN) 20 MG tablet Take 20 mg by mouth daily.      . cetirizine (ZYRTEC) 10 MG tablet Take 1 tablet by mouth daily.  5  . citalopram (CELEXA) 20 MG tablet Take 20 mg by mouth daily.     . diazepam (VALIUM) 5 MG tablet Take 5 mg by mouth at bedtime as needed. For nerves     . furosemide (LASIX) 40 MG tablet  Take 40 mg by mouth daily.    . Lansoprazole (PREVACID PO) Take by mouth.    . metFORMIN (GLUCOPHAGE) 500 MG tablet Take 500 mg by mouth daily with breakfast.     . metoprolol succinate (TOPROL-XL) 25 MG 24 hr tablet take ONE-HALF tab BY MOUTH DAILY 45 tablet 2  . Misc Natural Products (OSTEO BI-FLEX TRIPLE STRENGTH PO) Take 1 capsule by mouth daily.    . Multiple Vitamin (MULTIVITAMIN) tablet Take 1 tablet by mouth daily.      . nitroGLYCERIN (NITROSTAT) 0.4 MG SL tablet Place 1 tablet (0.4 mg total) under the tongue every 5 (five) minutes as needed for chest pain. 25 tablet 3  . Omega-3 Fatty Acids (FISH OIL) 1200 MG CAPS Take 1 capsule by mouth daily.      Marland Kitchen rOPINIRole (REQUIP) 1 MG tablet Take 1 tab in AM and 2 PM    . traMADol (ULTRAM) 50 MG tablet Take 1 tablet by mouth daily as needed.    . vitamin C (ASCORBIC ACID) 500 MG tablet Take 500 mg by mouth daily.     No current facility-administered medications for this visit.     Past Medical History:  Diagnosis Date  . Aortic insufficiency    Mild, echo, January, 2012  //   Mild, echo, February, 2014  . Asbestos exposure   . Bradycardia    MILD  . CAD (coronary artery disease)  2003   last catheterization 2003, potential ischemia in the ramus, grafts patent  ( last nuclear 2002)  . Carotid artery disease ([place]) 2001   BILATERAL 0-39%/  Follow with Dopplers by Dr. Woody Seller  . Colitis   . Diabetes mellitus   . Dizziness    He has had falls related to this / Chronic dizziness  . Dyslipidemia   . Ejection fraction    50%, echo, January, 2012, no mention of significant diastolic dysfunction  . Hx of CABG   . Hypertension   . Mitral regurgitation    Mild, echo, February, 2014  . Nephrolithiasis   . Pneumonia    left lower lobe  . PVD (peripheral vascular disease) ([place])   . Renal cyst   . Shortness of breath    May, 2012  . SOB (shortness of breath)   . TIA (transient ischemic attack) 2007   ?? medical Rx     Past Surgical  History:  Procedure Laterality Date  . CORONARY ARTERY BYPASS GRAFT    . percutaneous transluminal coronary angioplasty     hx    Social History   Socioeconomic History  . Marital status: Widowed    Spouse name: Not on file  . Number of children: 4  . Years of education: Not on file  . Highest education level: Not on file  Occupational History  . Occupation: RETIRED  Social Needs  . Financial resource strain: Not on file  . Food insecurity:    Worry: Not on file    Inability: Not on file  . Transportation needs:    Medical: Not on file    Non-medical: Not on file  Tobacco Use  . Smoking status: Former Smoker    Packs/day: 2.00    Years: 30.00    Pack years: 60.00    Types: Cigarettes    Start date: 12/23/1946    Last attempt to quit: 04/26/1980    Years since quitting: 38.0  . Smokeless tobacco: Never Used  Substance and Sexual Activity  . Alcohol use: Not on file  . Drug use: Not on file  . Sexual activity: Not on file  Lifestyle  . Physical activity:    Days per week: Not on file    Minutes per session: Not on file  . Stress: Not on file  Relationships  . Social connections:    Talks on phone: Not on file    Gets together: Not on file    Attends religious service: Not on file    Active member of club or organization: Not on file    Attends meetings of clubs or organizations: Not on file    Relationship status: Not on file  . Intimate partner violence:    Fear of current or ex partner: Not on file    Emotionally abused: Not on file    Physically abused: Not on file    Forced sexual activity: Not on file  Other Topics Concern  . Not on file  Social History Narrative  . Not on file     Vitals:   05/18/18 1457  BP: 122/72  Pulse: 95  SpO2: 98%  Weight: 204 lb (92.5 kg)  Height: 5\' 11"  (1.803 m)    Wt Readings from Last 3 Encounters:  05/18/18 204 lb (92.5 kg)  11/25/17 196 lb 9.6 oz (89.2 kg)  05/26/17 207 lb (93.9 kg)     PHYSICAL  EXAM General: NAD HEENT: Normal. Neck: No JVD, no thyromegaly. Lungs:  Clear to auscultation bilaterally with normal respiratory effort. CV: Regular rate and rhythm, normal S1/S2, no S3/S4, no murmur. No pretibial or periankle edema.     Abdomen: Soft, nontender, no distention.  Neurologic: Alert and oriented.  Psych: Normal affect. Skin: Normal. Musculoskeletal: No gross deformities.    ECG: Reviewed above under Subjective   Labs: Lab Results  Component Value Date/Time   K 4.1 04/03/2011 01:52 AM   BUN 22 04/03/2011 01:52 AM   CREATININE 0.90 04/03/2011 01:52 AM   HGB 12.4 (L) 04/03/2011 01:52 AM     Lipids: No results found for: LDLCALC, LDLDIRECT, CHOL, TRIG, HDL     ASSESSMENT AND PLAN: 1. CADwith a history of CABG:He has had progressive exertional dyspnea and fatigue over the past 3 to 4 months.  Most recent nuclearstress test results reviewed above with large area of inferior scar with no evidence of ischemia.  ECG from today in December 2018 were compared and T wave inversions are currently slightly more pronounced than they had been in December 2018. We talked about the possibilities of repeat stress testing versus cardiac catheterization. I will obtain a Lexiscan Myoview stress test to see if he has developed any significant ischemic territories. I will reduce benazepril to 10 mg and increase Toprol-XL to 25 mg daily.  I will continue aspirin and atorvastatin.  2. Essentialhypertension: Controlled. No changes to therapy.  3. Chroniccombined systolic anddiastolic heart failure, LVEF 45-50%:Euvolemic and stable.Continue Lasix 40 mg daily along with benazepril and metoprolol succinate with dose adjustments as noted above.    Disposition: Follow up 6-8 weeks   Kate Sable, M.D., F.A.C.C.

## 2018-05-18 NOTE — Patient Instructions (Addendum)
Medication Instructions:   Decrease Benazepril to 10mg  daily.   Increase Toprol XL to 25mg  daily.   Continue all other medications.    Labwork: none  Testing/Procedures:  Your physician has requested that you have a lexiscan myoview. For further information please visit [email or web address]. Please follow instruction sheet, as given.  Office will contact with results via phone or letter.    Follow-Up: 6-8 weeks   Any Other Special Instructions Will Be Listed Below (If Applicable).  If you need a refill on your cardiac medications before your next appointment, please call your pharmacy.

## 2018-05-27 DIAGNOSIS — I77810 Thoracic aortic ectasia: Secondary | ICD-10-CM

## 2018-05-27 HISTORY — DX: Thoracic aortic ectasia: I77.810

## 2018-05-31 DIAGNOSIS — E119 Type 2 diabetes mellitus without complications: Secondary | ICD-10-CM | POA: Diagnosis not present

## 2018-05-31 DIAGNOSIS — I10 Essential (primary) hypertension: Secondary | ICD-10-CM | POA: Diagnosis not present

## 2018-05-31 DIAGNOSIS — E7800 Pure hypercholesterolemia, unspecified: Secondary | ICD-10-CM | POA: Diagnosis not present

## 2018-05-31 DIAGNOSIS — I2510 Atherosclerotic heart disease of native coronary artery without angina pectoris: Secondary | ICD-10-CM | POA: Diagnosis not present

## 2018-06-01 ENCOUNTER — Ambulatory Visit (HOSPITAL_COMMUNITY)
Admission: RE | Admit: 2018-06-01 | Discharge: 2018-06-01 | Disposition: A | Discharge status: Home / Self Care | Payer: Medicare Other | Source: Ambulatory Visit | Attending: Cardiovascular Disease | Admitting: Cardiovascular Disease

## 2018-06-01 ENCOUNTER — Encounter (HOSPITAL_COMMUNITY): Payer: Self-pay

## 2018-06-01 ENCOUNTER — Encounter (HOSPITAL_COMMUNITY): Payer: Medicare Other

## 2018-06-01 DIAGNOSIS — R0609 Other forms of dyspnea: Secondary | ICD-10-CM | POA: Insufficient documentation

## 2018-06-01 DIAGNOSIS — R0600 Dyspnea, unspecified: Secondary | ICD-10-CM

## 2018-06-01 HISTORY — DX: Malignant (primary) neoplasm, unspecified: C80.1

## 2018-06-01 HISTORY — DX: Heart failure, unspecified: I50.9

## 2018-06-01 LAB — NM MYOCAR MULTI W/SPECT W/WALL MOTION / EF
LV dias vol: 161 mL (ref 62–150)
LV sys vol: 104 mL
Peak HR: 79 {beats}/min
RATE: 0.43
Rest HR: 58 {beats}/min
SDS: 2
SRS: 13
SSS: 15
TID: 1.09

## 2018-06-01 MED ORDER — SODIUM CHLORIDE 0.9% FLUSH
INTRAVENOUS | Status: AC
  Administered 2018-06-01: 10 mL via INTRAVENOUS
  Filled 2018-06-01: qty 10

## 2018-06-01 MED ORDER — REGADENOSON 0.4 MG/5ML IV SOLN
INTRAVENOUS | Status: AC
  Administered 2018-06-01: 0.4 mg via INTRAVENOUS
  Filled 2018-06-01: qty 5

## 2018-06-01 MED ORDER — TECHNETIUM TC 99M TETROFOSMIN IV KIT
10.0000 | PACK | Freq: Once | INTRAVENOUS | Status: AC | PRN
  Administered 2018-06-01: 10 via INTRAVENOUS

## 2018-06-01 MED ORDER — TECHNETIUM TC 99M TETROFOSMIN IV KIT
30.0000 | PACK | Freq: Once | INTRAVENOUS | Status: AC | PRN
  Administered 2018-06-01: 32 via INTRAVENOUS

## 2018-06-02 ENCOUNTER — Telehealth: Payer: Self-pay | Admitting: *Deleted

## 2018-06-02 DIAGNOSIS — R0600 Dyspnea, unspecified: Secondary | ICD-10-CM

## 2018-06-02 DIAGNOSIS — R9439 Abnormal result of other cardiovascular function study: Secondary | ICD-10-CM

## 2018-06-02 DIAGNOSIS — R931 Abnormal findings on diagnostic imaging of heart and coronary circulation: Secondary | ICD-10-CM

## 2018-06-02 DIAGNOSIS — R0609 Other forms of dyspnea: Secondary | ICD-10-CM

## 2018-06-02 NOTE — Telephone Encounter (Signed)
Notes recorded by Laurine Blazer, LPN on 12/31/9148 at 4:13 PM EST Daughter [name]) notified. Copy to pmd. Follow up scheduled for March with Dr. Paralee Cancel office. She agrees to doing the Echo. Order will be entered into epic & fwd to scheduling. ------  Notes recorded by Herminio Commons, MD on 06/02/2018 at 2:12 PM EST Cardiac function is reduced. Evidence for prior heart attack with small blockage which will be managed with medications. Please obtain an echocardiogram to clarify cardiac function.

## 2018-06-06 DIAGNOSIS — I10 Essential (primary) hypertension: Secondary | ICD-10-CM | POA: Diagnosis not present

## 2018-06-06 DIAGNOSIS — Z6829 Body mass index (BMI) 29.0-29.9, adult: Secondary | ICD-10-CM | POA: Diagnosis not present

## 2018-06-06 DIAGNOSIS — E1165 Type 2 diabetes mellitus with hyperglycemia: Secondary | ICD-10-CM | POA: Diagnosis not present

## 2018-06-06 DIAGNOSIS — I2510 Atherosclerotic heart disease of native coronary artery without angina pectoris: Secondary | ICD-10-CM | POA: Diagnosis not present

## 2018-06-06 DIAGNOSIS — Z299 Encounter for prophylactic measures, unspecified: Secondary | ICD-10-CM | POA: Diagnosis not present

## 2018-06-06 DIAGNOSIS — I4891 Unspecified atrial fibrillation: Secondary | ICD-10-CM | POA: Diagnosis not present

## 2018-06-07 ENCOUNTER — Ambulatory Visit (INDEPENDENT_AMBULATORY_CARE_PROVIDER_SITE_OTHER): Payer: Medicare Other

## 2018-06-07 ENCOUNTER — Telehealth: Payer: Self-pay | Admitting: Cardiovascular Disease

## 2018-06-07 DIAGNOSIS — R0600 Dyspnea, unspecified: Secondary | ICD-10-CM

## 2018-06-07 DIAGNOSIS — R9439 Abnormal result of other cardiovascular function study: Secondary | ICD-10-CM

## 2018-06-07 DIAGNOSIS — R0609 Other forms of dyspnea: Secondary | ICD-10-CM

## 2018-06-07 NOTE — Telephone Encounter (Signed)
Pre-cert Verification for the following procedure    Echo scheduled for today 06-07-2018 at [facility]

## 2018-06-12 DIAGNOSIS — L918 Other hypertrophic disorders of the skin: Secondary | ICD-10-CM | POA: Diagnosis not present

## 2018-06-12 DIAGNOSIS — D0462 Carcinoma in situ of skin of left upper limb, including shoulder: Secondary | ICD-10-CM | POA: Diagnosis not present

## 2018-06-12 DIAGNOSIS — C4499 Other specified malignant neoplasm of skin, unspecified: Secondary | ICD-10-CM | POA: Diagnosis not present

## 2018-06-12 DIAGNOSIS — L570 Actinic keratosis: Secondary | ICD-10-CM | POA: Diagnosis not present

## 2018-06-12 DIAGNOSIS — D485 Neoplasm of uncertain behavior of skin: Secondary | ICD-10-CM | POA: Diagnosis not present

## 2018-06-14 ENCOUNTER — Telehealth: Payer: Self-pay | Admitting: *Deleted

## 2018-06-14 NOTE — Telephone Encounter (Signed)
Notes recorded by Laurine Blazer, LPN on 2/95/6213 at 08:65 AM EST Daughter [name]) notified. Copy to pmd. Follow up scheduled for 07/21/18 with Dr. Paralee Cancel office. ------  Notes recorded by Herminio Commons, MD on 06/09/2018 at 9:16 AM EST Cardiac function is reduced from prior echocardiogram, down to 35 to 40%. Continue current medical therapy with medication adjustments noted at office visit.

## 2018-06-22 DIAGNOSIS — D485 Neoplasm of uncertain behavior of skin: Secondary | ICD-10-CM | POA: Diagnosis not present

## 2018-06-22 DIAGNOSIS — C4499 Other specified malignant neoplasm of skin, unspecified: Secondary | ICD-10-CM | POA: Diagnosis not present

## 2018-07-06 DIAGNOSIS — C44629 Squamous cell carcinoma of skin of left upper limb, including shoulder: Secondary | ICD-10-CM | POA: Diagnosis not present

## 2018-07-17 ENCOUNTER — Telehealth: Payer: Self-pay | Admitting: Cardiovascular Disease

## 2018-07-17 NOTE — Telephone Encounter (Signed)
I called and spoke with the patient's daughter, [name], about rescheduling his appointment given the [place] pandemic, in order to avoid unnecessary exposure.  He is stable from a cardiac standpoint with respect to symptoms and is very agreeable to having his appointment rescheduled to a later date. His daughter was thankful for the call.

## 2018-07-17 NOTE — Telephone Encounter (Signed)
Patient rescheduled till 10/26/2018

## 2018-07-19 DIAGNOSIS — I2510 Atherosclerotic heart disease of native coronary artery without angina pectoris: Secondary | ICD-10-CM | POA: Diagnosis not present

## 2018-07-19 DIAGNOSIS — I10 Essential (primary) hypertension: Secondary | ICD-10-CM | POA: Diagnosis not present

## 2018-07-19 DIAGNOSIS — E7800 Pure hypercholesterolemia, unspecified: Secondary | ICD-10-CM | POA: Diagnosis not present

## 2018-07-19 DIAGNOSIS — E119 Type 2 diabetes mellitus without complications: Secondary | ICD-10-CM | POA: Diagnosis not present

## 2018-07-21 ENCOUNTER — Ambulatory Visit: Payer: Medicare Other | Admitting: Cardiovascular Disease

## 2018-07-25 DIAGNOSIS — I25708 Atherosclerosis of coronary artery bypass graft(s), unspecified, with other forms of angina pectoris: Secondary | ICD-10-CM | POA: Diagnosis not present

## 2018-07-25 DIAGNOSIS — Z6829 Body mass index (BMI) 29.0-29.9, adult: Secondary | ICD-10-CM | POA: Diagnosis not present

## 2018-07-25 DIAGNOSIS — Z299 Encounter for prophylactic measures, unspecified: Secondary | ICD-10-CM | POA: Diagnosis not present

## 2018-07-25 DIAGNOSIS — I5042 Chronic combined systolic (congestive) and diastolic (congestive) heart failure: Secondary | ICD-10-CM | POA: Diagnosis not present

## 2018-07-25 DIAGNOSIS — E1165 Type 2 diabetes mellitus with hyperglycemia: Secondary | ICD-10-CM | POA: Diagnosis not present

## 2018-07-25 DIAGNOSIS — I4891 Unspecified atrial fibrillation: Secondary | ICD-10-CM | POA: Diagnosis not present

## 2018-07-26 ENCOUNTER — Telehealth: Payer: Self-pay | Admitting: Cardiovascular Disease

## 2018-07-26 MED ORDER — RANOLAZINE ER 500 MG PO TB12: 500.0000 mg | ORAL_TABLET | Freq: Two times a day (BID) | ORAL | 3 refills | Status: DC

## 2018-07-26 MED ORDER — METOPROLOL SUCCINATE ER 25 MG PO TB24: 37.5000 mg | ORAL_TABLET | Freq: Every day | ORAL | 1 refills | Status: DC

## 2018-07-26 NOTE — Telephone Encounter (Signed)
Pt and daughter voiced understanding - medications sent to pharmacy

## 2018-07-26 NOTE — Telephone Encounter (Signed)
Let's try Ranexa 500 mg bid and increase Toprol-XL to 37.5 mg daily.

## 2018-07-26 NOTE — Telephone Encounter (Signed)
Patient was prescribed IMDUR 30mg  by Dr.Vyas and patient's daughter does not feel that this patient should be taking this medication and would like confirmation from [place] whether he needs to continue. / tg

## 2018-07-26 NOTE — Telephone Encounter (Signed)
Pt daughter says telephone visit with Dr Woody Seller yesterday - c/o SOB that has gotten worse since LOV with Dr Bronson Ing, daughter said she did tell Dr Woody Seller of medication changes that were made by Dr Bronson Ing. Dr Woody Seller suggested Imdur 30 mg daily which pt took last night and cause bad headache with chills to the point pt couldn't sleep - pt was up most of the night with headache and SOB this morning is feeling some better BP 121/72 HR 77 - pt doesn't want to take Imdur with the reaction he has had and wanted to make Dr Bronson Ing aware of worsening SOB

## 2018-08-01 DIAGNOSIS — Z299 Encounter for prophylactic measures, unspecified: Secondary | ICD-10-CM | POA: Diagnosis not present

## 2018-08-01 DIAGNOSIS — I25708 Atherosclerosis of coronary artery bypass graft(s), unspecified, with other forms of angina pectoris: Secondary | ICD-10-CM | POA: Diagnosis not present

## 2018-08-01 DIAGNOSIS — I5042 Chronic combined systolic (congestive) and diastolic (congestive) heart failure: Secondary | ICD-10-CM | POA: Diagnosis not present

## 2018-08-01 DIAGNOSIS — E1165 Type 2 diabetes mellitus with hyperglycemia: Secondary | ICD-10-CM | POA: Diagnosis not present

## 2018-08-01 DIAGNOSIS — I4891 Unspecified atrial fibrillation: Secondary | ICD-10-CM | POA: Diagnosis not present

## 2018-08-02 DIAGNOSIS — I5042 Chronic combined systolic (congestive) and diastolic (congestive) heart failure: Secondary | ICD-10-CM | POA: Diagnosis not present

## 2018-08-24 DIAGNOSIS — E119 Type 2 diabetes mellitus without complications: Secondary | ICD-10-CM | POA: Diagnosis not present

## 2018-08-24 DIAGNOSIS — E7800 Pure hypercholesterolemia, unspecified: Secondary | ICD-10-CM | POA: Diagnosis not present

## 2018-08-24 DIAGNOSIS — I10 Essential (primary) hypertension: Secondary | ICD-10-CM | POA: Diagnosis not present

## 2018-08-24 DIAGNOSIS — I2510 Atherosclerotic heart disease of native coronary artery without angina pectoris: Secondary | ICD-10-CM | POA: Diagnosis not present

## 2018-09-06 DIAGNOSIS — I10 Essential (primary) hypertension: Secondary | ICD-10-CM | POA: Diagnosis not present

## 2018-09-06 DIAGNOSIS — E119 Type 2 diabetes mellitus without complications: Secondary | ICD-10-CM | POA: Diagnosis not present

## 2018-09-06 DIAGNOSIS — I2510 Atherosclerotic heart disease of native coronary artery without angina pectoris: Secondary | ICD-10-CM | POA: Diagnosis not present

## 2018-09-06 DIAGNOSIS — E7800 Pure hypercholesterolemia, unspecified: Secondary | ICD-10-CM | POA: Diagnosis not present

## 2018-09-12 DIAGNOSIS — I5042 Chronic combined systolic (congestive) and diastolic (congestive) heart failure: Secondary | ICD-10-CM | POA: Diagnosis not present

## 2018-09-12 DIAGNOSIS — I4891 Unspecified atrial fibrillation: Secondary | ICD-10-CM | POA: Diagnosis not present

## 2018-09-12 DIAGNOSIS — E1165 Type 2 diabetes mellitus with hyperglycemia: Secondary | ICD-10-CM | POA: Diagnosis not present

## 2018-09-12 DIAGNOSIS — I25708 Atherosclerosis of coronary artery bypass graft(s), unspecified, with other forms of angina pectoris: Secondary | ICD-10-CM | POA: Diagnosis not present

## 2018-09-12 DIAGNOSIS — Z6828 Body mass index (BMI) 28.0-28.9, adult: Secondary | ICD-10-CM | POA: Diagnosis not present

## 2018-09-12 DIAGNOSIS — Z299 Encounter for prophylactic measures, unspecified: Secondary | ICD-10-CM | POA: Diagnosis not present

## 2018-09-12 DIAGNOSIS — I10 Essential (primary) hypertension: Secondary | ICD-10-CM | POA: Diagnosis not present

## 2018-10-06 DIAGNOSIS — E119 Type 2 diabetes mellitus without complications: Secondary | ICD-10-CM | POA: Diagnosis not present

## 2018-10-06 DIAGNOSIS — I2510 Atherosclerotic heart disease of native coronary artery without angina pectoris: Secondary | ICD-10-CM | POA: Diagnosis not present

## 2018-10-06 DIAGNOSIS — E7800 Pure hypercholesterolemia, unspecified: Secondary | ICD-10-CM | POA: Diagnosis not present

## 2018-10-06 DIAGNOSIS — I10 Essential (primary) hypertension: Secondary | ICD-10-CM | POA: Diagnosis not present

## 2018-10-18 ENCOUNTER — Telehealth: Payer: Self-pay | Admitting: *Deleted

## 2018-10-18 NOTE — Telephone Encounter (Signed)
Patient's daughter Christopherjohn Schiele verbally consented for telehealth visits with [facility] and understands that her insurance company will be billed for the encounter.   Aware to have vitals available

## 2018-10-23 DIAGNOSIS — H2513 Age-related nuclear cataract, bilateral: Secondary | ICD-10-CM | POA: Diagnosis not present

## 2018-10-23 DIAGNOSIS — E119 Type 2 diabetes mellitus without complications: Secondary | ICD-10-CM | POA: Diagnosis not present

## 2018-10-26 ENCOUNTER — Encounter: Payer: Self-pay | Admitting: *Deleted

## 2018-10-26 ENCOUNTER — Telehealth (INDEPENDENT_AMBULATORY_CARE_PROVIDER_SITE_OTHER): Payer: Medicare Other | Admitting: Cardiovascular Disease

## 2018-10-26 ENCOUNTER — Encounter: Payer: Self-pay | Admitting: Cardiovascular Disease

## 2018-10-26 VITALS — BP 112/73 | HR 72 | Ht 71.0 in | Wt 202.0 lb

## 2018-10-26 DIAGNOSIS — I10 Essential (primary) hypertension: Secondary | ICD-10-CM

## 2018-10-26 DIAGNOSIS — R0609 Other forms of dyspnea: Secondary | ICD-10-CM | POA: Diagnosis not present

## 2018-10-26 DIAGNOSIS — I5042 Chronic combined systolic (congestive) and diastolic (congestive) heart failure: Secondary | ICD-10-CM | POA: Diagnosis not present

## 2018-10-26 DIAGNOSIS — I25708 Atherosclerosis of coronary artery bypass graft(s), unspecified, with other forms of angina pectoris: Secondary | ICD-10-CM | POA: Diagnosis not present

## 2018-10-26 DIAGNOSIS — I110 Hypertensive heart disease with heart failure: Secondary | ICD-10-CM | POA: Diagnosis not present

## 2018-10-26 DIAGNOSIS — R0600 Dyspnea, unspecified: Secondary | ICD-10-CM

## 2018-10-26 MED ORDER — RANOLAZINE ER 1000 MG PO TB12: 1000.0000 mg | ORAL_TABLET | Freq: Two times a day (BID) | ORAL | 3 refills | Status: DC

## 2018-10-26 NOTE — Addendum Note (Signed)
Addended by: Julian Hy T on: 10/26/2018 11:09 AM   Modules accepted: Orders

## 2018-10-26 NOTE — Patient Instructions (Signed)
Your physician recommends that you schedule a follow-up appointment in: [place] physician has recommended you make the following change in your medication:   INCREASE RANEXA 1000 MG TWICE DAILY   Thank you for choosing [facility]!!

## 2018-10-26 NOTE — Progress Notes (Signed)
Virtual Visit via Video Note   This visit type was conducted due to national recommendations for restrictions regarding the COVID-19 Pandemic (e.g. social distancing) in an effort to limit this patient's exposure and mitigate transmission in our community.  Due to his co-morbid illnesses, this patient is at least at moderate risk for complications without adequate follow up.  This format is felt to be most appropriate for this patient at this time.  All issues noted in this document were discussed and addressed.  A limited physical exam was performed with this format.  Please refer to the patient's chart for his consent to telehealth for [facility].   Date:  10/26/2018   ID:  [name], DOB 1926/06/26, MRN [id number]  Patient Location: Home Provider Location: Office  PCP:  Glenda Chroman, MD  Cardiologist:  Kate Sable, MD  Electrophysiologist:  None   Evaluation Performed:  Follow-Up Visit  Chief Complaint: Coronary artery disease  History of Present Illness:    [name] is a 85 y.o. male with a history of CABG in 08/04/1992. His last cath was in 08-04-01 and demonstrated patent grafts.   Since medication adjustments made earlier this year, he seldom has chest pain.  He does however continue to complain of exertional dyspnea and fatigue.  He walks 20 feet and says he gives out.  Last summer he did extensive outdoor work and was able to tear down a fence and worked outdoors for about 10 hours a day without any difficulty.  The patient does not have symptoms concerning for COVID-19 infection (fever, chills, cough, or new shortness of breath).   Social history: His wife of 37 years passed away in 08/05/14. His daughter lives with him now.   Past Medical History:  Diagnosis Date  . Aortic insufficiency    Mild, echo, January, 2012  //   Mild, echo, February, 2014  . Asbestos exposure   . Bradycardia    MILD  . CAD (coronary artery disease) 08/04/2001   last catheterization  08/04/01, potential ischemia in the ramus, grafts patent  ( last nuclear 08-04-00)  . Cancer (HCC)    Skin  . Carotid artery disease ([place]) 05-Aug-1999   BILATERAL 0-39%/  Follow with Dopplers by Dr. Woody Seller  . CHF (congestive heart failure) ([place])   . Colitis   . Diabetes mellitus   . Dizziness    He has had falls related to this / Chronic dizziness  . Dyslipidemia   . Ejection fraction    50%, echo, January, 2012, no mention of significant diastolic dysfunction  . Hx of CABG   . Hypertension   . Mitral regurgitation    Mild, echo, February, 2014  . Nephrolithiasis   . Pneumonia    left lower lobe  . PVD (peripheral vascular disease) ([place])   . Renal cyst   . Shortness of breath    May, 2012  . SOB (shortness of breath)   . TIA (transient ischemic attack) 08-04-05   ?? medical Rx    Past Surgical History:  Procedure Laterality Date  . CORONARY ARTERY BYPASS GRAFT    . percutaneous transluminal coronary angioplasty     hx     Current Meds  Medication Sig  . aspirin EC 81 MG tablet Take 1 tablet (81 mg total) by mouth daily.  Marland Kitchen atorvastatin (LIPITOR) 20 MG tablet Take 20 mg by mouth daily.  . benazepril (LOTENSIN) 10 MG tablet Take 1 tablet (10 mg total) by  mouth daily.  . cetirizine (ZYRTEC) 10 MG tablet Take 1 tablet by mouth daily.  . citalopram (CELEXA) 40 MG tablet Take 40 mg by mouth daily.  . diazepam (VALIUM) 5 MG tablet Take 5 mg by mouth at bedtime as needed. For nerves   . furosemide (LASIX) 40 MG tablet Take 40 mg by mouth daily.  . Lansoprazole (PREVACID PO) Take by mouth.  . metFORMIN (GLUCOPHAGE) 500 MG tablet Take 500 mg by mouth daily with breakfast.   . metoprolol succinate (TOPROL-XL) 25 MG 24 hr tablet Take 25 mg by mouth daily.  . Misc Natural Products (OSTEO BI-FLEX TRIPLE STRENGTH PO) Take 1 capsule by mouth daily.  . Multiple Vitamin (MULTIVITAMIN) tablet Take 1 tablet by mouth daily.    . nitroGLYCERIN (NITROSTAT) 0.4 MG SL tablet Place 1 tablet (0.4 mg total) under  the tongue every 5 (five) minutes as needed for chest pain.  . ranolazine (RANEXA) 500 MG 12 hr tablet Take 1 tablet (500 mg total) by mouth 2 (two) times daily.  Marland Kitchen rOPINIRole (REQUIP) 1 MG tablet Take 1 tab in AM and 2 PM  . [DISCONTINUED] citalopram (CELEXA) 20 MG tablet Take 20 mg by mouth daily.   . [DISCONTINUED] metoprolol succinate (TOPROL XL) 25 MG 24 hr tablet Take 1.5 tablets (37.5 mg total) by mouth daily. (Patient taking differently: Take 25 mg by mouth daily. )  . [DISCONTINUED] Omega-3 Fatty Acids (FISH OIL) 1200 MG CAPS Take 1 capsule by mouth daily.       Allergies:   Sulfonamide derivatives and Tetracycline   Social History   Tobacco Use  . Smoking status: Former Smoker    Packs/day: 2.00    Years: 30.00    Pack years: 60.00    Types: Cigarettes    Start date: 12/23/1946    Quit date: 04/26/1980    Years since quitting: 38.5  . Smokeless tobacco: Never Used  Substance Use Topics  . Alcohol use: Not on file  . Drug use: Not on file     Family Hx: The patient's family history includes Heart attack (age of onset: 55) in his mother.  ROS:   Please see the history of present illness.     All other systems reviewed and are negative.   Prior CV studies:   The following studies were reviewed today:  His last cath was in 2003 and demonstrated patent grafts.   Nuclear stress test 06/01/2018:   No diagnostic ST segment changes indicate ischemia.  Large, severe intensity, apical to basal inferior/inferolateral defect that is fixed and consistent with scar. There is a partially reversible basal anterolateral segment consistent with mild ischemia.  This is a high risk study based on reduced LVEF, not necessarily ischemic burden.  Nuclear stress EF: 35%. Consider echocardiogram to further clarify.    Echocardiogram 06/07/2018:  1. The left ventricle has moderately reduced systolic function, with an ejection fraction of 35-40%. The cavity size was normal. There is  mildly increased left ventricular wall thickness. Left ventricular diastolic Doppler parameters are consistent with  impaired relaxation Indeterminent filling pressures Left ventricular diffuse hypokinesis.  2. Left atrial size was mildly dilated.  3. The mitral valve is normal in structure. There is mild mitral annular calcification present.  4. The tricuspid valve is normal in structure.  5. The aortic valve is tricuspid There is mild thickening and sclerosis without any evidence of stenosis of the aortic valve. Aortic valve regurgitation is trivial by color flow Doppler.  6.  There is mild dilatation of the aortic root.  7. Right atrial pressure is estimated at 10 mmHg.  8. The interatrial septum was not well visualized.    Labs/Other Tests and Data Reviewed:    EKG:  No ECG reviewed.  Recent Labs: No results found for requested labs within last 8760 hours.   Recent Lipid Panel No results found for: CHOL, TRIG, HDL, CHOLHDL, LDLCALC, LDLDIRECT  Wt Readings from Last 3 Encounters:  10/26/18 202 lb (91.6 kg)  05/18/18 204 lb (92.5 kg)  11/25/17 196 lb 9.6 oz (89.2 kg)     Objective:    Vital Signs:  BP 112/73   Pulse 72   Ht 5\' 11"  (1.803 m)   Wt 202 lb (91.6 kg)   BMI 28.17 kg/m    VITAL SIGNS:  reviewed GEN:  no acute distress EYES:  sclerae anicteric, EOMI - Extraocular Movements Intact RESPIRATORY:  normal respiratory effort, symmetric expansion NEURO:  alert and oriented x 3, no obvious focal deficit PSYCH:  normal affect  ASSESSMENT & PLAN:    1.  Coronary artery disease: History of CABG.  Nuclear stress test reviewed above with no significant ischemic territories.  Echocardiogram demonstrating reduced cardiac function, LVEF 35 to 40%.  Continue aspirin, Toprol-XL, benazepril, and atorvastatin.  He continues to have exertional dyspnea and fatigue which is a significant change since last summer.  I will increase Ranexa to 1000 mg twice daily.  I will obtain a copy  of labs from PCP.  If he continues to experience symptoms at the time of his next visit, I may arrange for cardiac catheterization.  I discussed this with the patient and his daughter and they are in agreement with this plan.  2.  Hypertension: Controlled on present therapy.  No changes.  3.  Chronic combined heart failure: LVEF 35 to 40%: Continue Lasix 40 mg daily along with benazepril and Toprol-XL.  I may consider switching benazepril to Entresto.  I will first obtain a copy of labs from PCP.     COVID-19 Education: The signs and symptoms of COVID-19 were discussed with the patient and how to seek care for testing (follow up with PCP or arrange E-visit).  The importance of social distancing was discussed today.  Time:   Today, I have spent 15 minutes with the patient with telehealth technology discussing the above problems.     Medication Adjustments/Labs and Tests Ordered: Current medicines are reviewed at length with the patient today.  Concerns regarding medicines are outlined above.   Tests Ordered: No orders of the defined types were placed in this encounter.   Medication Changes: No orders of the defined types were placed in this encounter.   Follow Up:  Virtual Visit or In Person in 2 month(s)  Signed, Kate Sable, MD  10/26/2018 10:42 AM    [place]

## 2018-11-13 DIAGNOSIS — I2510 Atherosclerotic heart disease of native coronary artery without angina pectoris: Secondary | ICD-10-CM | POA: Diagnosis not present

## 2018-11-13 DIAGNOSIS — I10 Essential (primary) hypertension: Secondary | ICD-10-CM | POA: Diagnosis not present

## 2018-11-13 DIAGNOSIS — E7800 Pure hypercholesterolemia, unspecified: Secondary | ICD-10-CM | POA: Diagnosis not present

## 2018-11-13 DIAGNOSIS — E119 Type 2 diabetes mellitus without complications: Secondary | ICD-10-CM | POA: Diagnosis not present

## 2018-12-12 DIAGNOSIS — L570 Actinic keratosis: Secondary | ICD-10-CM | POA: Diagnosis not present

## 2018-12-14 DIAGNOSIS — E7800 Pure hypercholesterolemia, unspecified: Secondary | ICD-10-CM | POA: Diagnosis not present

## 2018-12-14 DIAGNOSIS — E119 Type 2 diabetes mellitus without complications: Secondary | ICD-10-CM | POA: Diagnosis not present

## 2018-12-14 DIAGNOSIS — I2510 Atherosclerotic heart disease of native coronary artery without angina pectoris: Secondary | ICD-10-CM | POA: Diagnosis not present

## 2018-12-14 DIAGNOSIS — I10 Essential (primary) hypertension: Secondary | ICD-10-CM | POA: Diagnosis not present

## 2018-12-26 DIAGNOSIS — Z7189 Other specified counseling: Secondary | ICD-10-CM | POA: Diagnosis not present

## 2018-12-26 DIAGNOSIS — I25708 Atherosclerosis of coronary artery bypass graft(s), unspecified, with other forms of angina pectoris: Secondary | ICD-10-CM | POA: Diagnosis not present

## 2018-12-26 DIAGNOSIS — Z1211 Encounter for screening for malignant neoplasm of colon: Secondary | ICD-10-CM | POA: Diagnosis not present

## 2018-12-26 DIAGNOSIS — Z1339 Encounter for screening examination for other mental health and behavioral disorders: Secondary | ICD-10-CM | POA: Diagnosis not present

## 2018-12-26 DIAGNOSIS — I5042 Chronic combined systolic (congestive) and diastolic (congestive) heart failure: Secondary | ICD-10-CM | POA: Diagnosis not present

## 2018-12-26 DIAGNOSIS — Z0000 Encounter for general adult medical examination without abnormal findings: Secondary | ICD-10-CM | POA: Diagnosis not present

## 2018-12-26 DIAGNOSIS — Z6829 Body mass index (BMI) 29.0-29.9, adult: Secondary | ICD-10-CM | POA: Diagnosis not present

## 2018-12-26 DIAGNOSIS — Z299 Encounter for prophylactic measures, unspecified: Secondary | ICD-10-CM | POA: Diagnosis not present

## 2018-12-26 DIAGNOSIS — I10 Essential (primary) hypertension: Secondary | ICD-10-CM | POA: Diagnosis not present

## 2018-12-26 DIAGNOSIS — Z1331 Encounter for screening for depression: Secondary | ICD-10-CM | POA: Diagnosis not present

## 2018-12-26 DIAGNOSIS — E1165 Type 2 diabetes mellitus with hyperglycemia: Secondary | ICD-10-CM | POA: Diagnosis not present

## 2018-12-26 DIAGNOSIS — R5383 Other fatigue: Secondary | ICD-10-CM | POA: Diagnosis not present

## 2018-12-28 DIAGNOSIS — Z79899 Other long term (current) drug therapy: Secondary | ICD-10-CM | POA: Diagnosis not present

## 2018-12-28 DIAGNOSIS — R5383 Other fatigue: Secondary | ICD-10-CM | POA: Diagnosis not present

## 2018-12-28 DIAGNOSIS — I10 Essential (primary) hypertension: Secondary | ICD-10-CM | POA: Diagnosis not present

## 2018-12-28 DIAGNOSIS — Z125 Encounter for screening for malignant neoplasm of prostate: Secondary | ICD-10-CM | POA: Diagnosis not present

## 2019-01-03 ENCOUNTER — Other Ambulatory Visit: Payer: Self-pay | Admitting: Cardiovascular Disease

## 2019-01-11 ENCOUNTER — Other Ambulatory Visit: Payer: Self-pay | Admitting: Cardiovascular Disease

## 2019-01-19 DIAGNOSIS — I2510 Atherosclerotic heart disease of native coronary artery without angina pectoris: Secondary | ICD-10-CM | POA: Diagnosis not present

## 2019-01-19 DIAGNOSIS — E119 Type 2 diabetes mellitus without complications: Secondary | ICD-10-CM | POA: Diagnosis not present

## 2019-01-19 DIAGNOSIS — E7800 Pure hypercholesterolemia, unspecified: Secondary | ICD-10-CM | POA: Diagnosis not present

## 2019-01-19 DIAGNOSIS — I10 Essential (primary) hypertension: Secondary | ICD-10-CM | POA: Diagnosis not present

## 2019-01-24 ENCOUNTER — Other Ambulatory Visit: Payer: Self-pay

## 2019-01-24 ENCOUNTER — Ambulatory Visit (INDEPENDENT_AMBULATORY_CARE_PROVIDER_SITE_OTHER): Payer: Medicare Other | Admitting: Cardiovascular Disease

## 2019-01-24 ENCOUNTER — Encounter: Payer: Self-pay | Admitting: Cardiovascular Disease

## 2019-01-24 VITALS — BP 127/75 | HR 72 | Ht 71.0 in | Wt 205.2 lb

## 2019-01-24 DIAGNOSIS — I10 Essential (primary) hypertension: Secondary | ICD-10-CM

## 2019-01-24 DIAGNOSIS — R0609 Other forms of dyspnea: Secondary | ICD-10-CM

## 2019-01-24 DIAGNOSIS — R0600 Dyspnea, unspecified: Secondary | ICD-10-CM

## 2019-01-24 DIAGNOSIS — I5042 Chronic combined systolic (congestive) and diastolic (congestive) heart failure: Secondary | ICD-10-CM | POA: Diagnosis not present

## 2019-01-24 DIAGNOSIS — I25708 Atherosclerosis of coronary artery bypass graft(s), unspecified, with other forms of angina pectoris: Secondary | ICD-10-CM | POA: Diagnosis not present

## 2019-01-24 DIAGNOSIS — R600 Localized edema: Secondary | ICD-10-CM | POA: Diagnosis not present

## 2019-01-24 MED ORDER — POTASSIUM CHLORIDE CRYS ER 20 MEQ PO TBCR: 20.0000 meq | EXTENDED_RELEASE_TABLET | Freq: Every day | ORAL | 6 refills | Status: DC

## 2019-01-24 NOTE — Progress Notes (Signed)
SUBJECTIVE: [name] is a 85 y.o. male with a history of CABG in 1992-08-09. His last cath was in 09-Aug-2001 and demonstrated patent grafts.   Labs 08/03/2018: Hemoglobin 12.8, BUN 18, creatinine 1.47, BNP 186, sodium 137, potassium 4.7.  He is here with his daughter.  Since increasing Ranexa to 1000 mg twice daily, symptoms of chest pain have improved.  However, he continues to experience significant exertional dyspnea which she was not experiencing last summer.  He had been walking with a cane and now walks with a walker.  Last week he has had some falls.  He also complains of a left-sided headache.   Social history: His wife of 46 years passed away in 10-Aug-2014. His daughter lives with him now.  Review of Systems: As per "subjective", otherwise negative.  Allergies  Allergen Reactions  . Sulfonamide Derivatives   . Tetracycline     Current Outpatient Medications  Medication Sig Dispense Refill  . aspirin EC 81 MG tablet Take 1 tablet (81 mg total) by mouth daily.    Marland Kitchen atorvastatin (LIPITOR) 20 MG tablet Take 20 mg by mouth daily.  4  . benazepril (LOTENSIN) 10 MG tablet TAKE 1 TABLET BY MOUTH EVERY DAY 90 tablet 1  . cetirizine (ZYRTEC) 10 MG tablet Take 1 tablet by mouth daily.  5  . citalopram (CELEXA) 40 MG tablet Take 40 mg by mouth daily.    . diazepam (VALIUM) 5 MG tablet Take 5 mg by mouth at bedtime as needed. For nerves     . furosemide (LASIX) 40 MG tablet Take 40 mg by mouth daily.    . metFORMIN (GLUCOPHAGE) 500 MG tablet Take 500 mg by mouth at bedtime.     . metoprolol succinate (TOPROL-XL) 25 MG 24 hr tablet TAKE 1 TABLET BY MOUTH EVERY DAY 30 tablet 6  . Multiple Vitamin (MULTIVITAMIN) tablet Take 1 tablet by mouth daily.      . nitroGLYCERIN (NITROSTAT) 0.4 MG SL tablet Place 1 tablet (0.4 mg total) under the tongue every 5 (five) minutes as needed for chest pain. 25 tablet 3  . ranolazine (RANEXA) 1000 MG SR tablet Take 1 tablet (1,000 mg total) by mouth 2 (two)  times daily. 60 tablet 3  . rOPINIRole (REQUIP) 1 MG tablet Take 1 tab in AM and 2 PM     No current facility-administered medications for this visit.     Past Medical History:  Diagnosis Date  . Aortic insufficiency    Mild, echo, January, 2012  //   Mild, echo, February, 2014  . Asbestos exposure   . Bradycardia    MILD  . CAD (coronary artery disease) 09-Aug-2001   last catheterization 08/09/01, potential ischemia in the ramus, grafts patent  ( last nuclear 08/09/00)  . Cancer (HCC)    Skin  . Carotid artery disease ([place]) 1999/08/10   BILATERAL 0-39%/  Follow with Dopplers by Dr. Woody Seller  . CHF (congestive heart failure) ([place])   . Colitis   . Diabetes mellitus   . Dizziness    He has had falls related to this / Chronic dizziness  . Dyslipidemia   . Ejection fraction    50%, echo, January, 2012, no mention of significant diastolic dysfunction  . Hx of CABG   . Hypertension   . Mitral regurgitation    Mild, echo, February, 2014  . Nephrolithiasis   . Pneumonia    left lower lobe  . PVD (peripheral vascular disease) (  [place])   . Renal cyst   . Shortness of breath    May, 2012  . SOB (shortness of breath)   . TIA (transient ischemic attack) 2007   ?? medical Rx     Past Surgical History:  Procedure Laterality Date  . CORONARY ARTERY BYPASS GRAFT    . percutaneous transluminal coronary angioplasty     hx    Social History   Socioeconomic History  . Marital status: Widowed    Spouse name: Not on file  . Number of children: 4  . Years of education: Not on file  . Highest education level: Not on file  Occupational History  . Occupation: RETIRED  Social Needs  . Financial resource strain: Not on file  . Food insecurity    Worry: Not on file    Inability: Not on file  . Transportation needs    Medical: Not on file    Non-medical: Not on file  Tobacco Use  . Smoking status: Former Smoker    Packs/day: 2.00    Years: 30.00    Pack years: 60.00    Types: Cigarettes    Start date:  12/23/1946    Quit date: 04/26/1980    Years since quitting: 38.7  . Smokeless tobacco: Never Used  Substance and Sexual Activity  . Alcohol use: Not on file  . Drug use: Not on file  . Sexual activity: Not on file  Lifestyle  . Physical activity    Days per week: Not on file    Minutes per session: Not on file  . Stress: Not on file  Relationships  . Social Herbalist on phone: Not on file    Gets together: Not on file    Attends religious service: Not on file    Active member of club or organization: Not on file    Attends meetings of clubs or organizations: Not on file    Relationship status: Not on file  . Intimate partner violence    Fear of current or ex partner: Not on file    Emotionally abused: Not on file    Physically abused: Not on file    Forced sexual activity: Not on file  Other Topics Concern  . Not on file  Social History Narrative  . Not on file     Vitals:   01/24/19 1307  BP: 127/75  Pulse: 72  SpO2: 96%  Weight: 205 lb 3.2 oz (93.1 kg)  Height: 5\' 11"  (1.803 m)    Wt Readings from Last 3 Encounters:  01/24/19 205 lb 3.2 oz (93.1 kg)  10/26/18 202 lb (91.6 kg)  05/18/18 204 lb (92.5 kg)     PHYSICAL EXAM General: NAD HEENT: Normal. Neck: No JVD, no thyromegaly. Lungs: Clear to auscultation bilaterally with normal respiratory effort. CV: Regular rate and rhythm, normal S1/S2, no S3/S4, no murmur.  1+ pitting bilateral peri-ankle edema.  No carotid bruit.   Abdomen: Soft, nontender, no distention.  Neurologic: Alert and oriented.  Psych: Normal affect. Skin: Normal. Musculoskeletal: No gross deformities.      Labs: Lab Results  Component Value Date/Time   K 4.1 04/03/2011 01:52 AM   BUN 22 04/03/2011 01:52 AM   CREATININE 0.90 04/03/2011 01:52 AM   HGB 12.4 (L) 04/03/2011 01:52 AM     Lipids: No results found for: LDLCALC, LDLDIRECT, CHOL, TRIG, HDL     Prior CV studies:   The following studies were reviewed  today:  His last cath was in 2003 and demonstrated patent grafts.   Nuclear stress test 06/01/2018:   No diagnostic ST segment changes indicate ischemia.  Large, severe intensity, apical to basal inferior/inferolateral defect that is fixed and consistent with scar. There is a partially reversible basal anterolateral segment consistent with mild ischemia.  This is a high risk study based on reduced LVEF, not necessarily ischemic burden.  Nuclear stress EF: 35%. Consider echocardiogram to further clarify.    Echocardiogram 06/07/2018:  1. The left ventricle has moderately reduced systolic function, with an ejection fraction of 35-40%. The cavity size was normal. There is mildly increased left ventricular wall thickness. Left ventricular diastolic Doppler parameters are consistent with impaired relaxation Indeterminent filling pressures Left ventricular diffuse hypokinesis. 2. Left atrial size was mildly dilated. 3. The mitral valve is normal in structure. There is mild mitral annular calcification present. 4. The tricuspid valve is normal in structure. 5. The aortic valve is tricuspid There is mild thickening and sclerosis without any evidence of stenosis of the aortic valve. Aortic valve regurgitation is trivial by color flow Doppler. 6. There is mild dilatation of the aortic root. 7. Right atrial pressure is estimated at 10 mmHg. 8. The interatrial septum was not well visualized.    ASSESSMENT AND PLAN: 1.  Coronary artery disease: History of CABG.  Nuclear stress test reviewed above with mild basal anterolateral ischemia.  Echocardiogram demonstrating reduced cardiac function, LVEF 35 to 40%.  Continue aspirin, Toprol-XL, benazepril, Ranexa 1000 mg twice daily and atorvastatin.   He continues to have exertional dyspnea and fatigue which is a significant change since last summer.  I will increase Lasix to 40 mg twice daily for 5 days to see if this helps improve  symptoms.  If it does not, I will proceed with coronary angiography.  I discussed this with the patient and his daughter and they are both in agreement with this plan. I asked his daughter to call us back in 1 week to update [place] on her father's symptoms.  2.  Hypertension: Controlled on present therapy.    I will monitor given increased diuretic dose.  3.  Chronic combined heart failure/DOE: LVEF 35 to 40%: He continues to complain of exertional dyspnea which is significantly worse since last summer.  I will increase Lasix to 40 mg twice daily for 5 days.  I will provide potassium chloride 20 mEq daily.  I will check a basic metabolic panel within the next several days.  I asked his daughter to call us back in 1 week to update [place] on her father's symptoms. Continue benazepril and Toprol-XL.    Creatinine 1.47 in April 2020.    Disposition: Follow up 2 weeks virtual visit   Kate Sable, M.D., F.A.C.C.

## 2019-01-24 NOTE — Patient Instructions (Addendum)
Medication Instructions:   Increase Furosemide to 40mg  twice a day x 5 days, then back to daily.  Begin Potassium 35meq daily.  Continue all other medications.    Labwork:  BMET - order given today.   Please do lab on Monday, 01/29/2019 at [facility] - go into main entrance.    Please go fasting since you are diabetic.  Office will contact with results via phone or letter.    Testing/Procedures: none  Follow-Up: 2 weeks   Any Other Special Instructions Will Be Listed Below (If Applicable). Call the office in one week with update on how he is doing.    If you need a refill on your cardiac medications before your next appointment, please call your pharmacy.

## 2019-01-29 ENCOUNTER — Other Ambulatory Visit: Payer: Self-pay

## 2019-01-29 ENCOUNTER — Other Ambulatory Visit (HOSPITAL_COMMUNITY)
Admission: RE | Admit: 2019-01-29 | Discharge: 2019-01-29 | Disposition: A | Discharge status: Home / Self Care | Payer: Medicare Other | Source: Ambulatory Visit | Attending: Cardiovascular Disease | Admitting: Cardiovascular Disease

## 2019-01-29 DIAGNOSIS — R0609 Other forms of dyspnea: Secondary | ICD-10-CM | POA: Insufficient documentation

## 2019-01-29 DIAGNOSIS — I5042 Chronic combined systolic (congestive) and diastolic (congestive) heart failure: Secondary | ICD-10-CM | POA: Insufficient documentation

## 2019-01-29 LAB — BASIC METABOLIC PANEL
Anion gap: 12 (ref 5–15)
BUN: 30 mg/dL — ABNORMAL HIGH (ref 8–23)
CO2: 27 mmol/L (ref 22–32)
Calcium: 9.4 mg/dL (ref 8.9–10.3)
Chloride: 100 mmol/L (ref 98–111)
Creatinine, Ser: 1.58 mg/dL — ABNORMAL HIGH (ref 0.61–1.24)
GFR calc Af Amer: 44 mL/min — ABNORMAL LOW (ref >60)
GFR calc non Af Amer: 38 mL/min — ABNORMAL LOW (ref >60)
Glucose, Bld: 133 mg/dL — ABNORMAL HIGH (ref 70–99)
Potassium: 4.9 mmol/L (ref 3.5–5.1)
Sodium: 139 mmol/L (ref 135–145)

## 2019-01-30 ENCOUNTER — Telehealth: Payer: Self-pay | Admitting: *Deleted

## 2019-01-30 DIAGNOSIS — I5042 Chronic combined systolic (congestive) and diastolic (congestive) heart failure: Secondary | ICD-10-CM

## 2019-01-30 DIAGNOSIS — I25708 Atherosclerosis of coronary artery bypass graft(s), unspecified, with other forms of angina pectoris: Secondary | ICD-10-CM

## 2019-01-30 DIAGNOSIS — R7989 Other specified abnormal findings of blood chemistry: Secondary | ICD-10-CM

## 2019-01-30 NOTE — Telephone Encounter (Signed)
Lab order mailed to home.  He will do around 02/13/2019 at [facility].

## 2019-01-30 NOTE — Telephone Encounter (Signed)
Daughter also questions that he may have UTI going on - stated her mother had them a lot when she got older & acted off when she had them.  Stated that he has been wanting to sleep a lot more and some mild confusion that is not typical for him.  Advised her to contact his pcp to discuss with them & possibly take him in for urinary sample at the least.  She verbalized understanding.

## 2019-01-30 NOTE — Telephone Encounter (Signed)
Notes recorded by Laurine Blazer, LPN on [id number] at [id number] AM EDT  Daughter [name]) notified. Copy to pmd. Already scheduled for vv on 02/08/2019.  ------   Notes recorded by Herminio Commons, MD on 01/29/2019 at 2:54 PM EDT  Chronic kidney disease stage III reflective of increased diuretic dosage. Please repeat basic metabolic panel in 2 weeks.

## 2019-01-31 ENCOUNTER — Other Ambulatory Visit: Payer: Self-pay | Admitting: Cardiovascular Disease

## 2019-01-31 DIAGNOSIS — Z6828 Body mass index (BMI) 28.0-28.9, adult: Secondary | ICD-10-CM | POA: Diagnosis not present

## 2019-01-31 DIAGNOSIS — R410 Disorientation, unspecified: Secondary | ICD-10-CM | POA: Diagnosis not present

## 2019-01-31 DIAGNOSIS — R5383 Other fatigue: Secondary | ICD-10-CM | POA: Diagnosis not present

## 2019-01-31 DIAGNOSIS — Z299 Encounter for prophylactic measures, unspecified: Secondary | ICD-10-CM | POA: Diagnosis not present

## 2019-01-31 DIAGNOSIS — I4891 Unspecified atrial fibrillation: Secondary | ICD-10-CM | POA: Diagnosis not present

## 2019-01-31 DIAGNOSIS — E1165 Type 2 diabetes mellitus with hyperglycemia: Secondary | ICD-10-CM | POA: Diagnosis not present

## 2019-02-05 DIAGNOSIS — Z23 Encounter for immunization: Secondary | ICD-10-CM | POA: Diagnosis not present

## 2019-02-08 ENCOUNTER — Encounter: Payer: Self-pay | Admitting: Cardiovascular Disease

## 2019-02-08 ENCOUNTER — Other Ambulatory Visit: Payer: Self-pay | Admitting: Cardiovascular Disease

## 2019-02-08 ENCOUNTER — Other Ambulatory Visit: Payer: Self-pay

## 2019-02-08 ENCOUNTER — Telehealth (INDEPENDENT_AMBULATORY_CARE_PROVIDER_SITE_OTHER): Payer: Medicare Other | Admitting: Cardiovascular Disease

## 2019-02-08 VITALS — BP 111/83 | HR 77 | Wt 199.0 lb

## 2019-02-08 DIAGNOSIS — I10 Essential (primary) hypertension: Secondary | ICD-10-CM

## 2019-02-08 DIAGNOSIS — E785 Hyperlipidemia, unspecified: Secondary | ICD-10-CM

## 2019-02-08 DIAGNOSIS — I5042 Chronic combined systolic (congestive) and diastolic (congestive) heart failure: Secondary | ICD-10-CM

## 2019-02-08 DIAGNOSIS — R0600 Dyspnea, unspecified: Secondary | ICD-10-CM

## 2019-02-08 DIAGNOSIS — I25708 Atherosclerosis of coronary artery bypass graft(s), unspecified, with other forms of angina pectoris: Secondary | ICD-10-CM

## 2019-02-08 DIAGNOSIS — R9439 Abnormal result of other cardiovascular function study: Secondary | ICD-10-CM

## 2019-02-08 DIAGNOSIS — N183 Chronic kidney disease, stage 3 unspecified: Secondary | ICD-10-CM

## 2019-02-08 DIAGNOSIS — R0609 Other forms of dyspnea: Secondary | ICD-10-CM

## 2019-02-08 MED ORDER — SODIUM CHLORIDE 0.9% FLUSH: 3.0000 mL | Freq: Two times a day (BID) | INTRAVENOUS | Status: DC

## 2019-02-08 NOTE — Addendum Note (Signed)
Addended by: Debbora Lacrosse R on: 02/08/2019 10:22 AM   Modules accepted: Orders

## 2019-02-08 NOTE — Patient Instructions (Signed)
Medication Instructions:  Your physician recommends that you continue on your current medications as directed. Please refer to the Current Medication list given to you today.   Labwork: Tuesday   COVID 19 TEST  Cbc Bmet  Testing/Procedures: Your physician has requested that you have a cardiac catheterization. Cardiac catheterization is used to diagnose and/or treat various heart conditions. Doctors may recommend this procedure for a number of different reasons. The most common reason is to evaluate chest pain. Chest pain can be a symptom of coronary artery disease (CAD), and cardiac catheterization can show whether plaque is narrowing or blocking your heart's arteries. This procedure is also used to evaluate the valves, as well as measure the blood flow and oxygen levels in different parts of your heart. For further information please visit [email or web address]. Please follow instruction sheet, as given.    Follow-Up: Your physician recommends that you schedule a follow-up appointment in: 2 months    Any Other Special Instructions Will Be Listed Below (If Applicable).     If you need a refill on your cardiac medications before your next appointment, please call your pharmacy.

## 2019-02-08 NOTE — Progress Notes (Signed)
Virtual Visit via Telephone Note   This visit type was conducted due to national recommendations for restrictions regarding the COVID-19 Pandemic (e.g. social distancing) in an effort to limit this patient's exposure and mitigate transmission in our community.  Due to his co-morbid illnesses, this patient is at least at moderate risk for complications without adequate follow up.  This format is felt to be most appropriate for this patient at this time.  The patient did not have access to video technology/had technical difficulties with video requiring transitioning to audio format only (telephone).  All issues noted in this document were discussed and addressed.  No physical exam could be performed with this format.  Please refer to the patient's chart for his  consent to telehealth for [facility].   Date:  02/08/2019   ID:  [name], DOB [birth year], MRN [id number]  Patient Location: Home Provider Location: Home  PCP:  Glenda Chroman, MD  Cardiologist:  Kate Sable, MD  Electrophysiologist:  None   Evaluation Performed:  Follow-Up Visit  Chief Complaint:  CAD  istory of Present Illness:    [name] is a 85 y.o. male with is a 85 y.o.malewith a history of CABG in 1992/08/02. His last cath was in Aug 02, 2001 and demonstrated patent grafts.   In summary, I saw him in the office on 01/24/2019 and he had been complaining of increasing exertional dyspnea and fatigue which represented a significant change from last summer.  I increase Lasix to 40 mg twice daily for 5 days to see if this would help to improve symptoms.  We discussed the possibility of coronary angiography at that time as his daughter [name]) was present at that visit, and she lives with him.  Labs on 01/29/2019: BUN 30, creatinine 1.58. He is due for repeat blood work next 08-03-22.  He continues to have shortness of breath with exertion. His leg swelling has improved. He fell when he went to get labs checked  at [facility].    Social history: His wife of 26 years passed away in August 03, 2014. His daughter lives with him now.  He has a son as well.  His daughter used to work at [employer] in [employer].  Past Medical History:  Diagnosis Date  . Aortic insufficiency    Mild, echo, January, 2012  //   Mild, echo, February, 2014  . Asbestos exposure   . Bradycardia    MILD  . CAD (coronary artery disease) 02-Aug-2001   last catheterization 2001-08-02, potential ischemia in the ramus, grafts patent  ( last nuclear 08-02-2000)  . Cancer (HCC)    Skin  . Carotid artery disease ([place]) 1999-08-03   BILATERAL 0-39%/  Follow with Dopplers by Dr. Woody Seller  . CHF (congestive heart failure) ([place])   . Colitis   . Diabetes mellitus   . Dizziness    He has had falls related to this / Chronic dizziness  . Dyslipidemia   . Ejection fraction    50%, echo, January, 2012, no mention of significant diastolic dysfunction  . Hx of CABG   . Hypertension   . Mitral regurgitation    Mild, echo, February, 2014  . Nephrolithiasis   . Pneumonia    left lower lobe  . PVD (peripheral vascular disease) ([place])   . Renal cyst   . Shortness of breath    May, 2012  . SOB (shortness of breath)   . TIA (transient ischemic attack) 2005-08-02   ?? medical  Rx    Past Surgical History:  Procedure Laterality Date  . CORONARY ARTERY BYPASS GRAFT    . percutaneous transluminal coronary angioplasty     hx     Current Meds  Medication Sig  . aspirin EC 81 MG tablet Take 1 tablet (81 mg total) by mouth daily.  Marland Kitchen atorvastatin (LIPITOR) 20 MG tablet Take 20 mg by mouth daily.  . benazepril (LOTENSIN) 10 MG tablet TAKE 1 TABLET BY MOUTH EVERY DAY  . cetirizine (ZYRTEC) 10 MG tablet Take 1 tablet by mouth daily.  . citalopram (CELEXA) 40 MG tablet Take 40 mg by mouth daily.  . diazepam (VALIUM) 5 MG tablet Take 5 mg by mouth at bedtime as needed. For nerves   . furosemide (LASIX) 40 MG tablet Take 40 mg by mouth daily.  . metFORMIN (GLUCOPHAGE) 500 MG tablet  Take 500 mg by mouth at bedtime.   . metoprolol succinate (TOPROL-XL) 25 MG 24 hr tablet TAKE 1 TABLET BY MOUTH EVERY DAY  . Multiple Vitamin (MULTIVITAMIN) tablet Take 1 tablet by mouth daily.    . nitroGLYCERIN (NITROSTAT) 0.4 MG SL tablet Place 1 tablet (0.4 mg total) under the tongue every 5 (five) minutes as needed for chest pain.  . potassium chloride SA (KLOR-CON) 20 MEQ tablet Take 1 tablet (20 mEq total) by mouth daily.  . ranolazine (RANEXA) 1000 MG SR tablet TAKE 1 TABLET BY MOUTH TWICE DAILY  . rOPINIRole (REQUIP) 1 MG tablet Take 1 tab in AM and 2 PM     Allergies:   Sulfonamide derivatives and Tetracycline   Social History   Tobacco Use  . Smoking status: Former Smoker    Packs/day: 2.00    Years: 30.00    Pack years: 60.00    Types: Cigarettes    Start date: 12/23/1946    Quit date: 04/26/1980    Years since quitting: 38.8  . Smokeless tobacco: Never Used  Substance Use Topics  . Alcohol use: Not on file  . Drug use: Not on file     Family Hx: The patient's family history includes Heart attack (age of onset: 16) in his mother.  ROS:   Please see the history of present illness.     All other systems reviewed and are negative.   Prior CV studies:   The following studies were reviewed today:  His last cath was in 2003 and demonstrated patent grafts.   Nuclear stress test 06/01/2018:   No diagnostic ST segment changes indicate ischemia.  Large, severe intensity, apical to basal inferior/inferolateral defect that is fixed and consistent with scar. There is a partially reversible basal anterolateral segment consistent with mild ischemia.  This is a high risk study based on reduced LVEF, not necessarily ischemic burden.  Nuclear stress EF: 35%. Consider echocardiogram to further clarify.    Echocardiogram 06/07/2018:  1. The left ventricle has moderately reduced systolic function, with an ejection fraction of 35-40%. The cavity size was normal. There  is mildly increased left ventricular wall thickness. Left ventricular diastolic Doppler parameters are consistent with impaired relaxation Indeterminent filling pressures Left ventricular diffuse hypokinesis. 2. Left atrial size was mildly dilated. 3. The mitral valve is normal in structure. There is mild mitral annular calcification present. 4. The tricuspid valve is normal in structure. 5. The aortic valve is tricuspid There is mild thickening and sclerosis without any evidence of stenosis of the aortic valve. Aortic valve regurgitation is trivial by color flow Doppler. 6. There is mild  dilatation of the aortic root. 7. Right atrial pressure is estimated at 10 mmHg. 8. The interatrial septum was not well visualized.  Labs/Other Tests and Data Reviewed:    EKG:  No ECG reviewed.  Recent Labs: 01/29/2019: BUN 30; Creatinine, Ser 1.58; Potassium 4.9; Sodium 139   Recent Lipid Panel No results found for: CHOL, TRIG, HDL, CHOLHDL, LDLCALC, LDLDIRECT  Wt Readings from Last 3 Encounters:  02/08/19 199 lb (90.3 kg)  01/24/19 205 lb 3.2 oz (93.1 kg)  10/26/18 202 lb (91.6 kg)     Objective:    Vital Signs:  BP 111/83   Pulse 77   Wt 199 lb (90.3 kg)   BMI 27.75 kg/m    VITAL SIGNS:  reviewed  ASSESSMENT & PLAN:    1. Coronary artery disease: History of CABG. Nuclear stress test reviewed above with mild basal anterolateral ischemia. Echocardiogram demonstrating reduced cardiac function, LVEF 35 to 40%. Continue aspirin, Toprol-XL, benazepril, Ranexa 1000 mg twice daily and atorvastatin. He continues to have exertional dyspnea and fatigue which is a significant change since last summer, and likely represents his anginal equivalent.  Increasing Lasix to BID x 5 days did improve leg swelling but not his shortness of breath. I will proceed with coronary angiography.  I discussed this with the patient and his daughter at last visit and again with daughter today, and they are  both in agreement with this plan. He is scheduled for a follow-up basic metabolic panel on Tuesday.  2. Hypertension: Controlled on present therapy. No changes.  3. Chronic combined heart failure/Alvarez: LVEF 35 to 40%: He continues to complain of exertional dyspnea which is significantly worse since last summer.  I will increase Lasix to 40 mg twice daily for 5 days.  I will provide potassium chloride 20 mEq daily.  I will check a basic metabolic panel within the next several days.  I asked his daughter [name]) to call us back in 1 week to update [place] on her father's symptoms. Continue benazepril and Toprol-XL.    4.  CKD stage III: Creatinine 1.58. He is scheduled for a follow-up basic metabolic panel on Tuesday.     COVID-19 Education: The signs and symptoms of COVID-19 were discussed with the patient and how to seek care for testing (follow up with PCP or arrange E-visit).  The importance of social distancing was discussed today.  Time:   Today, I have spent 25 minutes with the patient with telehealth technology discussing the above problems.     Medication Adjustments/Labs and Tests Ordered: Current medicines are reviewed at length with the patient today.  Concerns regarding medicines are outlined above.   Tests Ordered: No orders of the defined types were placed in this encounter.   Medication Changes: No orders of the defined types were placed in this encounter.   Follow Up:  Virtual Visit  in 2 month(s)  Signed, Kate Sable, MD  02/08/2019 9:48 AM    [place]

## 2019-02-08 NOTE — H&P (View-Only) (Signed)
Virtual Visit via Telephone Note   This visit type was conducted due to national recommendations for restrictions regarding the COVID-19 Pandemic (e.g. social distancing) in an effort to limit this patient's exposure and mitigate transmission in our community.  Due to his co-morbid illnesses, this patient is at least at moderate risk for complications without adequate follow up.  This format is felt to be most appropriate for this patient at this time.  The patient did not have access to video technology/had technical difficulties with video requiring transitioning to audio format only (telephone).  All issues noted in this document were discussed and addressed.  No physical exam could be performed with this format.  Please refer to the patient's chart for his  consent to telehealth for [facility].   Date:  02/08/2019   ID:  [name], DOB Jan 28, 1927, MRN [id number]  Patient Location: Home Provider Location: Home  PCP:  Glenda Chroman, MD  Cardiologist:  Kate Sable, MD  Electrophysiologist:  None   Evaluation Performed:  Follow-Up Visit  Chief Complaint:  CAD  istory of Present Illness:    [name] is a 85 y.o. male with is a 85 y.o.malewith a history of CABG in 1992/07/31. His last cath was in Jul 31, 2001 and demonstrated patent grafts.   In summary, I saw him in the office on 01/24/2019 and he had been complaining of increasing exertional dyspnea and fatigue which represented a significant change from last summer.  I increase Lasix to 40 mg twice daily for 5 days to see if this would help to improve symptoms.  We discussed the possibility of coronary angiography at that time as his daughter [name]) was present at that visit, and she lives with him.  Labs on 01/29/2019: BUN 30, creatinine 1.58. He is due for repeat blood work next August 01, 2022.  He continues to have shortness of breath with exertion. His leg swelling has improved. He fell when he went to get labs checked  at [facility].    Social history: His wife of 57 years passed away in 08/01/2014. His daughter lives with him now.  He has a son as well.  His daughter used to work at [employer] in [employer].  Past Medical History:  Diagnosis Date  . Aortic insufficiency    Mild, echo, January, 2012  //   Mild, echo, February, 2014  . Asbestos exposure   . Bradycardia    MILD  . CAD (coronary artery disease) 07-31-01   last catheterization 07-31-01, potential ischemia in the ramus, grafts patent  ( last nuclear 07-31-00)  . Cancer (HCC)    Skin  . Carotid artery disease ([place]) August 01, 1999   BILATERAL 0-39%/  Follow with Dopplers by Dr. Woody Seller  . CHF (congestive heart failure) ([place])   . Colitis   . Diabetes mellitus   . Dizziness    He has had falls related to this / Chronic dizziness  . Dyslipidemia   . Ejection fraction    50%, echo, January, 2012, no mention of significant diastolic dysfunction  . Hx of CABG   . Hypertension   . Mitral regurgitation    Mild, echo, February, 2014  . Nephrolithiasis   . Pneumonia    left lower lobe  . PVD (peripheral vascular disease) ([place])   . Renal cyst   . Shortness of breath    May, 2012  . SOB (shortness of breath)   . TIA (transient ischemic attack) 07-31-05   ?? medical  Rx    Past Surgical History:  Procedure Laterality Date  . CORONARY ARTERY BYPASS GRAFT    . percutaneous transluminal coronary angioplasty     hx     Current Meds  Medication Sig  . aspirin EC 81 MG tablet Take 1 tablet (81 mg total) by mouth daily.  Marland Kitchen atorvastatin (LIPITOR) 20 MG tablet Take 20 mg by mouth daily.  . benazepril (LOTENSIN) 10 MG tablet TAKE 1 TABLET BY MOUTH EVERY DAY  . cetirizine (ZYRTEC) 10 MG tablet Take 1 tablet by mouth daily.  . citalopram (CELEXA) 40 MG tablet Take 40 mg by mouth daily.  . diazepam (VALIUM) 5 MG tablet Take 5 mg by mouth at bedtime as needed. For nerves   . furosemide (LASIX) 40 MG tablet Take 40 mg by mouth daily.  . metFORMIN (GLUCOPHAGE) 500 MG tablet  Take 500 mg by mouth at bedtime.   . metoprolol succinate (TOPROL-XL) 25 MG 24 hr tablet TAKE 1 TABLET BY MOUTH EVERY DAY  . Multiple Vitamin (MULTIVITAMIN) tablet Take 1 tablet by mouth daily.    . nitroGLYCERIN (NITROSTAT) 0.4 MG SL tablet Place 1 tablet (0.4 mg total) under the tongue every 5 (five) minutes as needed for chest pain.  . potassium chloride SA (KLOR-CON) 20 MEQ tablet Take 1 tablet (20 mEq total) by mouth daily.  . ranolazine (RANEXA) 1000 MG SR tablet TAKE 1 TABLET BY MOUTH TWICE DAILY  . rOPINIRole (REQUIP) 1 MG tablet Take 1 tab in AM and 2 PM     Allergies:   Sulfonamide derivatives and Tetracycline   Social History   Tobacco Use  . Smoking status: Former Smoker    Packs/day: 2.00    Years: 30.00    Pack years: 60.00    Types: Cigarettes    Start date: 12/23/1946    Quit date: 04/26/1980    Years since quitting: 38.8  . Smokeless tobacco: Never Used  Substance Use Topics  . Alcohol use: Not on file  . Drug use: Not on file     Family Hx: The patient's family history includes Heart attack (age of onset: 37) in his mother.  ROS:   Please see the history of present illness.     All other systems reviewed and are negative.   Prior CV studies:   The following studies were reviewed today:  His last cath was in 2003 and demonstrated patent grafts.   Nuclear stress test 06/01/2018:   No diagnostic ST segment changes indicate ischemia.  Large, severe intensity, apical to basal inferior/inferolateral defect that is fixed and consistent with scar. There is a partially reversible basal anterolateral segment consistent with mild ischemia.  This is a high risk study based on reduced LVEF, not necessarily ischemic burden.  Nuclear stress EF: 35%. Consider echocardiogram to further clarify.    Echocardiogram 06/07/2018:  1. The left ventricle has moderately reduced systolic function, with an ejection fraction of 35-40%. The cavity size was normal. There  is mildly increased left ventricular wall thickness. Left ventricular diastolic Doppler parameters are consistent with impaired relaxation Indeterminent filling pressures Left ventricular diffuse hypokinesis. 2. Left atrial size was mildly dilated. 3. The mitral valve is normal in structure. There is mild mitral annular calcification present. 4. The tricuspid valve is normal in structure. 5. The aortic valve is tricuspid There is mild thickening and sclerosis without any evidence of stenosis of the aortic valve. Aortic valve regurgitation is trivial by color flow Doppler. 6. There is mild  dilatation of the aortic root. 7. Right atrial pressure is estimated at 10 mmHg. 8. The interatrial septum was not well visualized.  Labs/Other Tests and Data Reviewed:    EKG:  No ECG reviewed.  Recent Labs: 01/29/2019: BUN 30; Creatinine, Ser 1.58; Potassium 4.9; Sodium 139   Recent Lipid Panel No results found for: CHOL, TRIG, HDL, CHOLHDL, LDLCALC, LDLDIRECT  Wt Readings from Last 3 Encounters:  02/08/19 199 lb (90.3 kg)  01/24/19 205 lb 3.2 oz (93.1 kg)  10/26/18 202 lb (91.6 kg)     Objective:    Vital Signs:  BP 111/83   Pulse 77   Wt 199 lb (90.3 kg)   BMI 27.75 kg/m    VITAL SIGNS:  reviewed  ASSESSMENT & PLAN:    1. Coronary artery disease: History of CABG. Nuclear stress test reviewed above with mild basal anterolateral ischemia. Echocardiogram demonstrating reduced cardiac function, LVEF 35 to 40%. Continue aspirin, Toprol-XL, benazepril, Ranexa 1000 mg twice daily and atorvastatin. He continues to have exertional dyspnea and fatigue which is a significant change since last summer, and likely represents his anginal equivalent.  Increasing Lasix to BID x 5 days did improve leg swelling but not his shortness of breath. I will proceed with coronary angiography.  I discussed this with the patient and his daughter at last visit and again with daughter today, and they are  both in agreement with this plan. He is scheduled for a follow-up basic metabolic panel on Tuesday.  2. Hypertension: Controlled on present therapy. No changes.  3. Chronic combined heart failure/Alvarez: LVEF 35 to 40%: He continues to complain of exertional dyspnea which is significantly worse since last summer.  I will increase Lasix to 40 mg twice daily for 5 days.  I will provide potassium chloride 20 mEq daily.  I will check a basic metabolic panel within the next several days.  I asked his daughter [name]) to call us back in 1 week to update [place] on her father's symptoms. Continue benazepril and Toprol-XL.    4.  CKD stage III: Creatinine 1.58. He is scheduled for a follow-up basic metabolic panel on Tuesday.     COVID-19 Education: The signs and symptoms of COVID-19 were discussed with the patient and how to seek care for testing (follow up with PCP or arrange E-visit).  The importance of social distancing was discussed today.  Time:   Today, I have spent 25 minutes with the patient with telehealth technology discussing the above problems.     Medication Adjustments/Labs and Tests Ordered: Current medicines are reviewed at length with the patient today.  Concerns regarding medicines are outlined above.   Tests Ordered: No orders of the defined types were placed in this encounter.   Medication Changes: No orders of the defined types were placed in this encounter.   Follow Up:  Virtual Visit  in 2 month(s)  Signed, Kate Sable, MD  02/08/2019 9:48 AM    [place]

## 2019-02-09 ENCOUNTER — Telehealth: Payer: Self-pay | Admitting: Cardiovascular Disease

## 2019-02-09 NOTE — Telephone Encounter (Signed)
Daughter wanted to confirm that pt was to hold metformin the night before morning of and 2 days after cath - also will hold lasix the day of the cath - daughter appreciative of explanation

## 2019-02-09 NOTE — Telephone Encounter (Signed)
[name] - daughter -called requesting information about patient taking his regular medications on the morning of his catheterization please call daughter [phone]. She requested to leave a message if no one answers.

## 2019-02-13 ENCOUNTER — Telehealth: Payer: Self-pay | Admitting: *Deleted

## 2019-02-13 ENCOUNTER — Other Ambulatory Visit (HOSPITAL_COMMUNITY)
Admission: RE | Admit: 2019-02-13 | Discharge: 2019-02-13 | Disposition: A | Discharge status: Home / Self Care | Payer: Medicare Other | Source: Ambulatory Visit | Attending: Interventional Cardiology | Admitting: Interventional Cardiology

## 2019-02-13 ENCOUNTER — Other Ambulatory Visit (HOSPITAL_COMMUNITY)
Admission: RE | Admit: 2019-02-13 | Discharge: 2019-02-13 | Disposition: A | Discharge status: Home / Self Care | Payer: Medicare Other | Source: Ambulatory Visit | Attending: Cardiovascular Disease | Admitting: Cardiovascular Disease

## 2019-02-13 ENCOUNTER — Other Ambulatory Visit: Payer: Self-pay

## 2019-02-13 DIAGNOSIS — Z01812 Encounter for preprocedural laboratory examination: Secondary | ICD-10-CM | POA: Diagnosis not present

## 2019-02-13 DIAGNOSIS — Z20828 Contact with and (suspected) exposure to other viral communicable diseases: Secondary | ICD-10-CM | POA: Insufficient documentation

## 2019-02-13 LAB — CBC WITH DIFFERENTIAL/PLATELET
Abs Immature Granulocytes: 0.03 10*3/uL (ref 0.00–0.07)
Basophils Absolute: 0.1 10*3/uL (ref 0.0–0.1)
Basophils Relative: 1 %
Eosinophils Absolute: 0.2 10*3/uL (ref 0.0–0.5)
Eosinophils Relative: 3 %
HCT: 41.4 % (ref 39.0–52.0)
Hemoglobin: 13.2 g/dL (ref 13.0–17.0)
Immature Granulocytes: 0 %
Lymphocytes Relative: 16 %
Lymphs Abs: 1.5 10*3/uL (ref 0.7–4.0)
MCH: 31.4 pg (ref 26.0–34.0)
MCHC: 31.9 g/dL (ref 30.0–36.0)
MCV: 98.3 fL (ref 80.0–100.0)
Monocytes Absolute: 0.7 10*3/uL (ref 0.1–1.0)
Monocytes Relative: 8 %
Neutro Abs: 6.8 10*3/uL (ref 1.7–7.7)
Neutrophils Relative %: 72 %
Platelets: 255 10*3/uL (ref 150–400)
RBC: 4.21 MIL/uL — ABNORMAL LOW (ref 4.22–5.81)
RDW: 12.6 % (ref 11.5–15.5)
WBC: 9.3 10*3/uL (ref 4.0–10.5)
nRBC: 0 % (ref 0.0–0.2)

## 2019-02-13 LAB — BASIC METABOLIC PANEL
Anion gap: 9 (ref 5–15)
BUN: 20 mg/dL (ref 8–23)
CO2: 26 mmol/L (ref 22–32)
Calcium: 9.2 mg/dL (ref 8.9–10.3)
Chloride: 102 mmol/L (ref 98–111)
Creatinine, Ser: 1.42 mg/dL — ABNORMAL HIGH (ref 0.61–1.24)
GFR calc Af Amer: 50 mL/min — ABNORMAL LOW (ref >60)
GFR calc non Af Amer: 43 mL/min — ABNORMAL LOW (ref >60)
Glucose, Bld: 209 mg/dL — ABNORMAL HIGH (ref 70–99)
Potassium: 5 mmol/L (ref 3.5–5.1)
Sodium: 137 mmol/L (ref 135–145)

## 2019-02-13 LAB — SARS CORONAVIRUS 2 (TAT 6-24 HRS): SARS Coronavirus 2: NEGATIVE

## 2019-02-13 NOTE — Telephone Encounter (Signed)
Pt contacted pre-catheterization scheduled at [facility] for: Thursday February 15, 2019 1:30 PM Verified arrival time and place: [facility]) at: 8:30 AM-pre procedure hydration  No solid food after midnight prior to cath, clear liquids until 5 AM day of procedure. Contrast allergy: no  Hold: Lasix-day before and day of procedure-GFR 43 Benazepril-day before and day of procedure-GFR  43 Metformin-day of procedure and 48 hours post procedure.  Except hold medications AM meds can be  taken pre-cath with sip of water including: ASA 81 mg   Confirmed patient has responsible adult to drive home post procedure and observe 24 hours after arriving home: yes  Currently, due to Covid-19 pandemic, only one support person will be allowed with patient. Must be the same support person for that patient's entire stay, will be screened and required to wear a mask. They will be asked to wait in the waiting room for the duration of the patient's stay.  Patients are required to wear a mask when they enter the hospital.      COVID-19 Pre-Screening Questions:  . In the past 7 to 10 days have you had a cough,  shortness of breath, headache, congestion, fever (100 or greater) body aches, chills, sore throat, or sudden loss of taste or sense of smell? Shortness of breath-not new . Have you been around anyone with known Covid 19? no . Have you been around anyone who is awaiting Covid 19 test results in the past 7 to 10 days? no . Have you been around anyone who has been exposed to Covid 19, or has mentioned symptoms of Covid 19 within the past 7 to 10 days? no   I reviewed procedure/mask/visitor instructions, Covid-19 screening questions with patient's daughter (DPR), [name], she verbalized understanding.  Pt's son, Jonni Sanger,  phone number [phone].      Per Dr Aurelio Jew for pre procedure hydration, standard weight based IV flow rate for hydration,  okay to hold  benazepril and lasix day before and day of procedure.

## 2019-02-15 ENCOUNTER — Encounter (HOSPITAL_COMMUNITY)
Admission: RE | Disposition: A | Discharge status: Home / Self Care | Payer: Self-pay | Source: Home / Self Care | Attending: Interventional Cardiology

## 2019-02-15 ENCOUNTER — Ambulatory Visit (HOSPITAL_COMMUNITY)
Admission: RE | Admit: 2019-02-15 | Discharge: 2019-02-16 | Disposition: A | Discharge status: Home / Self Care | Payer: Medicare Other | Attending: Interventional Cardiology | Admitting: Interventional Cardiology

## 2019-02-15 ENCOUNTER — Other Ambulatory Visit: Payer: Self-pay

## 2019-02-15 DIAGNOSIS — E1122 Type 2 diabetes mellitus with diabetic chronic kidney disease: Secondary | ICD-10-CM | POA: Insufficient documentation

## 2019-02-15 DIAGNOSIS — R0609 Other forms of dyspnea: Secondary | ICD-10-CM | POA: Diagnosis not present

## 2019-02-15 DIAGNOSIS — Z7902 Long term (current) use of antithrombotics/antiplatelets: Secondary | ICD-10-CM | POA: Diagnosis not present

## 2019-02-15 DIAGNOSIS — E119 Type 2 diabetes mellitus without complications: Secondary | ICD-10-CM

## 2019-02-15 DIAGNOSIS — I25118 Atherosclerotic heart disease of native coronary artery with other forms of angina pectoris: Secondary | ICD-10-CM

## 2019-02-15 DIAGNOSIS — Z882 Allergy status to sulfonamides status: Secondary | ICD-10-CM | POA: Diagnosis not present

## 2019-02-15 DIAGNOSIS — N183 Chronic kidney disease, stage 3 unspecified: Secondary | ICD-10-CM

## 2019-02-15 DIAGNOSIS — I5022 Chronic systolic (congestive) heart failure: Secondary | ICD-10-CM | POA: Insufficient documentation

## 2019-02-15 DIAGNOSIS — I25708 Atherosclerosis of coronary artery bypass graft(s), unspecified, with other forms of angina pectoris: Secondary | ICD-10-CM

## 2019-02-15 DIAGNOSIS — Z7984 Long term (current) use of oral hypoglycemic drugs: Secondary | ICD-10-CM | POA: Insufficient documentation

## 2019-02-15 DIAGNOSIS — Z8673 Personal history of transient ischemic attack (TIA), and cerebral infarction without residual deficits: Secondary | ICD-10-CM | POA: Insufficient documentation

## 2019-02-15 DIAGNOSIS — E785 Hyperlipidemia, unspecified: Secondary | ICD-10-CM | POA: Diagnosis not present

## 2019-02-15 DIAGNOSIS — R0600 Dyspnea, unspecified: Secondary | ICD-10-CM | POA: Diagnosis present

## 2019-02-15 DIAGNOSIS — I25119 Atherosclerotic heart disease of native coronary artery with unspecified angina pectoris: Secondary | ICD-10-CM | POA: Insufficient documentation

## 2019-02-15 DIAGNOSIS — I25810 Atherosclerosis of coronary artery bypass graft(s) without angina pectoris: Secondary | ICD-10-CM | POA: Diagnosis present

## 2019-02-15 DIAGNOSIS — E1151 Type 2 diabetes mellitus with diabetic peripheral angiopathy without gangrene: Secondary | ICD-10-CM | POA: Insufficient documentation

## 2019-02-15 DIAGNOSIS — Z955 Presence of coronary angioplasty implant and graft: Secondary | ICD-10-CM | POA: Insufficient documentation

## 2019-02-15 DIAGNOSIS — I130 Hypertensive heart and chronic kidney disease with heart failure and stage 1 through stage 4 chronic kidney disease, or unspecified chronic kidney disease: Secondary | ICD-10-CM | POA: Insufficient documentation

## 2019-02-15 DIAGNOSIS — Z79899 Other long term (current) drug therapy: Secondary | ICD-10-CM | POA: Insufficient documentation

## 2019-02-15 DIAGNOSIS — Z7982 Long term (current) use of aspirin: Secondary | ICD-10-CM | POA: Diagnosis not present

## 2019-02-15 DIAGNOSIS — Z87891 Personal history of nicotine dependence: Secondary | ICD-10-CM | POA: Diagnosis not present

## 2019-02-15 DIAGNOSIS — Z881 Allergy status to other antibiotic agents status: Secondary | ICD-10-CM | POA: Insufficient documentation

## 2019-02-15 DIAGNOSIS — I10 Essential (primary) hypertension: Secondary | ICD-10-CM | POA: Diagnosis present

## 2019-02-15 HISTORY — DX: Chronic combined systolic (congestive) and diastolic (congestive) heart failure: I50.42

## 2019-02-15 HISTORY — PX: LEFT HEART CATH AND CORS/GRAFTS ANGIOGRAPHY: CATH118250

## 2019-02-15 HISTORY — PX: CORONARY STENT INTERVENTION: CATH118234

## 2019-02-15 HISTORY — DX: Chronic kidney disease, stage 3 unspecified: N18.30

## 2019-02-15 LAB — POCT ACTIVATED CLOTTING TIME
Activated Clotting Time: 274 seconds
Activated Clotting Time: 307 seconds
Activated Clotting Time: 274 seconds

## 2019-02-15 LAB — GLUCOSE, CAPILLARY
Glucose-Capillary: 130 mg/dL — ABNORMAL HIGH (ref 70–99)
Glucose-Capillary: 127 mg/dL — ABNORMAL HIGH (ref 70–99)
Glucose-Capillary: 114 mg/dL — ABNORMAL HIGH (ref 70–99)

## 2019-02-15 SURGERY — LEFT HEART CATH AND CORS/GRAFTS ANGIOGRAPHY
Anesthesia: LOCAL

## 2019-02-15 MED ORDER — MIDAZOLAM HCL 2 MG/2ML IJ SOLN
INTRAMUSCULAR | Status: AC
  Filled 2019-02-15: qty 2

## 2019-02-15 MED ORDER — LIDOCAINE HCL (PF) 1 % IJ SOLN
INTRAMUSCULAR | Status: AC
  Filled 2019-02-15: qty 30

## 2019-02-15 MED ORDER — ATROPINE SULFATE 1 MG/10ML IJ SOSY
PREFILLED_SYRINGE | INTRAMUSCULAR | Status: AC
  Filled 2019-02-15: qty 10

## 2019-02-15 MED ORDER — LIDOCAINE HCL (PF) 1 % IJ SOLN
INTRAMUSCULAR | Status: DC | PRN
  Administered 2019-02-15: 15 mL

## 2019-02-15 MED ORDER — ASPIRIN 81 MG PO CHEW: 81.0000 mg | CHEWABLE_TABLET | ORAL | Status: DC

## 2019-02-15 MED ORDER — SODIUM CHLORIDE 0.9 % IV SOLN: 250.0000 mL | INTRAVENOUS | Status: DC | PRN

## 2019-02-15 MED ORDER — SODIUM CHLORIDE 0.9 % WEIGHT BASED INFUSION
3.0000 mL/kg/h | INTRAVENOUS | Status: DC
  Administered 2019-02-15: 3 mL/kg/h via INTRAVENOUS

## 2019-02-15 MED ORDER — HEPARIN (PORCINE) IN NACL 1000-0.9 UT/500ML-% IV SOLN
INTRAVENOUS | Status: DC | PRN
  Administered 2019-02-15: 500 mL

## 2019-02-15 MED ORDER — NITROGLYCERIN 0.4 MG SL SUBL: 0.4000 mg | SUBLINGUAL_TABLET | SUBLINGUAL | Status: DC | PRN

## 2019-02-15 MED ORDER — SODIUM CHLORIDE 0.9 % IV SOLN: INTRAVENOUS | Status: AC

## 2019-02-15 MED ORDER — CITALOPRAM HYDROBROMIDE 20 MG PO TABS
40.0000 mg | ORAL_TABLET | Freq: Every day | ORAL | Status: DC
  Administered 2019-02-15 – 2019-02-16 (×2): 40 mg via ORAL
  Filled 2019-02-15 (×2): qty 2

## 2019-02-15 MED ORDER — CLOPIDOGREL BISULFATE 300 MG PO TABS
ORAL_TABLET | ORAL | Status: AC
  Filled 2019-02-15: qty 2

## 2019-02-15 MED ORDER — LABETALOL HCL 5 MG/ML IV SOLN: 10.0000 mg | INTRAVENOUS | Status: AC | PRN

## 2019-02-15 MED ORDER — ASPIRIN EC 81 MG PO TBEC
81.0000 mg | DELAYED_RELEASE_TABLET | Freq: Every day | ORAL | Status: DC
  Administered 2019-02-16: 81 mg via ORAL
  Filled 2019-02-15: qty 1

## 2019-02-15 MED ORDER — HEPARIN SODIUM (PORCINE) 1000 UNIT/ML IJ SOLN
INTRAMUSCULAR | Status: AC
  Filled 2019-02-15: qty 1

## 2019-02-15 MED ORDER — ASPIRIN 81 MG PO CHEW: 81.0000 mg | CHEWABLE_TABLET | Freq: Every day | ORAL | Status: DC

## 2019-02-15 MED ORDER — VERAPAMIL HCL 2.5 MG/ML IV SOLN
INTRAVENOUS | Status: AC
  Filled 2019-02-15: qty 2

## 2019-02-15 MED ORDER — CLOPIDOGREL BISULFATE 300 MG PO TABS
ORAL_TABLET | ORAL | Status: DC | PRN
  Administered 2019-02-15: 600 mg via ORAL

## 2019-02-15 MED ORDER — ATORVASTATIN CALCIUM 10 MG PO TABS
20.0000 mg | ORAL_TABLET | Freq: Every day | ORAL | Status: DC
  Administered 2019-02-15 – 2019-02-16 (×2): 20 mg via ORAL
  Filled 2019-02-15 (×2): qty 2

## 2019-02-15 MED ORDER — FAMOTIDINE IN NACL 20-0.9 MG/50ML-% IV SOLN
INTRAVENOUS | Status: AC | PRN
  Administered 2019-02-15: 20 mg via INTRAVENOUS

## 2019-02-15 MED ORDER — ONDANSETRON HCL 4 MG/2ML IJ SOLN
4.0000 mg | Freq: Four times a day (QID) | INTRAMUSCULAR | Status: DC | PRN
  Administered 2019-02-15: 4 mg via INTRAVENOUS
  Filled 2019-02-15: qty 2

## 2019-02-15 MED ORDER — POTASSIUM CHLORIDE CRYS ER 20 MEQ PO TBCR
20.0000 meq | EXTENDED_RELEASE_TABLET | Freq: Every day | ORAL | Status: DC
  Filled 2019-02-15: qty 1

## 2019-02-15 MED ORDER — MIDAZOLAM HCL 2 MG/2ML IJ SOLN
INTRAMUSCULAR | Status: DC | PRN
  Administered 2019-02-15: 1 mg via INTRAVENOUS

## 2019-02-15 MED ORDER — SODIUM CHLORIDE 0.9% FLUSH: 3.0000 mL | INTRAVENOUS | Status: DC | PRN

## 2019-02-15 MED ORDER — ACETAMINOPHEN 325 MG PO TABS
650.0000 mg | ORAL_TABLET | ORAL | Status: DC | PRN
  Administered 2019-02-16: 650 mg via ORAL
  Filled 2019-02-15: qty 2

## 2019-02-15 MED ORDER — HYDRALAZINE HCL 20 MG/ML IJ SOLN: 10.0000 mg | INTRAMUSCULAR | Status: AC | PRN

## 2019-02-15 MED ORDER — IOHEXOL 350 MG/ML SOLN
INTRAVENOUS | Status: DC | PRN
  Administered 2019-02-15: 95 mL via INTRA_ARTERIAL

## 2019-02-15 MED ORDER — SODIUM CHLORIDE 0.9% FLUSH
3.0000 mL | Freq: Two times a day (BID) | INTRAVENOUS | Status: DC
  Administered 2019-02-15 – 2019-02-16 (×2): 3 mL via INTRAVENOUS

## 2019-02-15 MED ORDER — HEPARIN (PORCINE) IN NACL 1000-0.9 UT/500ML-% IV SOLN
INTRAVENOUS | Status: AC
  Filled 2019-02-15: qty 1000

## 2019-02-15 MED ORDER — FAMOTIDINE IN NACL 20-0.9 MG/50ML-% IV SOLN
INTRAVENOUS | Status: AC
  Filled 2019-02-15: qty 50

## 2019-02-15 MED ORDER — LORATADINE 10 MG PO TABS
10.0000 mg | ORAL_TABLET | Freq: Every day | ORAL | Status: DC
  Administered 2019-02-16: 10 mg via ORAL
  Filled 2019-02-15: qty 1

## 2019-02-15 MED ORDER — HEPARIN SODIUM (PORCINE) 1000 UNIT/ML IJ SOLN
INTRAMUSCULAR | Status: DC | PRN
  Administered 2019-02-15: 10000 [IU] via INTRAVENOUS
  Administered 2019-02-15: 4000 [IU] via INTRAVENOUS

## 2019-02-15 MED ORDER — CLOPIDOGREL BISULFATE 75 MG PO TABS
75.0000 mg | ORAL_TABLET | Freq: Every day | ORAL | Status: DC
  Administered 2019-02-16: 75 mg via ORAL
  Filled 2019-02-15: qty 1

## 2019-02-15 MED ORDER — VERAPAMIL HCL 2.5 MG/ML IV SOLN
INTRAVENOUS | Status: DC | PRN
  Administered 2019-02-15 (×2): 200 ug via INTRACORONARY

## 2019-02-15 MED ORDER — SODIUM CHLORIDE 0.9 % WEIGHT BASED INFUSION: 1.0000 mL/kg/h | INTRAVENOUS | Status: DC

## 2019-02-15 MED ORDER — METOPROLOL SUCCINATE ER 25 MG PO TB24
25.0000 mg | ORAL_TABLET | Freq: Every day | ORAL | Status: DC
  Administered 2019-02-15 – 2019-02-16 (×2): 25 mg via ORAL
  Filled 2019-02-15 (×2): qty 1

## 2019-02-15 MED ORDER — ROPINIROLE HCL 1 MG PO TABS
1.0000 mg | ORAL_TABLET | Freq: Two times a day (BID) | ORAL | Status: DC
  Administered 2019-02-15 – 2019-02-16 (×2): 1 mg via ORAL
  Filled 2019-02-15 (×2): qty 1

## 2019-02-15 SURGICAL SUPPLY — 20 items
BALLN SAPPHIRE 2.75X20 (BALLOONS) ×2
BALLN SAPPHIRE ~~LOC~~ 3.75X15 (BALLOONS) ×2 IMPLANT
BALLN WOLVERINE 3.00X15 (BALLOONS) ×2
BALLOON SAPPHIRE 2.75X20 (BALLOONS) ×1 IMPLANT
BALLOON WOLVERINE 3.00X15 (BALLOONS) ×1 IMPLANT
CATH DXT MULTI JL4 JR4 ANG PIG (CATHETERS) ×2 IMPLANT
CATH INFINITI 5 FR RCB (CATHETERS) ×2 IMPLANT
CATHETER LAUNCHER 6FR MP1 (CATHETERS) ×2 IMPLANT
KIT ENCORE 26 ADVANTAGE (KITS) ×2 IMPLANT
KIT HEART LEFT (KITS) ×2 IMPLANT
KIT HEMO VALVE WATCHDOG (MISCELLANEOUS) ×2 IMPLANT
PACK CARDIAC CATHETERIZATION (CUSTOM PROCEDURE TRAY) ×2 IMPLANT
SHEATH PINNACLE 5F 10CM (SHEATH) ×2 IMPLANT
SHEATH PINNACLE 6F 10CM (SHEATH) ×2 IMPLANT
SHEATH PROBE COVER 6X72 (BAG) ×2 IMPLANT
STENT SYNERGY DES 3.5X20 (Permanent Stent) ×2 IMPLANT
TRANSDUCER W/STOPCOCK (MISCELLANEOUS) ×2 IMPLANT
TUBING CIL FLEX 10 FLL-RA (TUBING) ×4 IMPLANT
WIRE ASAHI PROWATER 180CM (WIRE) ×2 IMPLANT
WIRE EMERALD 3MM-J .035X150CM (WIRE) ×2 IMPLANT

## 2019-02-15 NOTE — Progress Notes (Signed)
Site area: right groin  Site Prior to Removal:  Level 0  Pressure Applied For 28 MINUTES    Minutes Beginning at 21:48  Manual:   Yes.    Patient Status During Pull:  WNL  Post Pull Groin Site:  Level 0  Post Pull Instructions Given:  Yes.    Post Pull Pulses Present:  Yes.    Dressing Applied:  Yes.    Comments:  Pt tolerated well

## 2019-02-15 NOTE — Progress Notes (Signed)
Loose liquid brown stool. Cleaned

## 2019-02-15 NOTE — Interval H&P Note (Signed)
Cath Lab Visit (complete for each Cath Lab visit)  Clinical Evaluation Leading to the Procedure:   ACS: No.  Non-ACS:    Anginal Classification: CCS III  Anti-ischemic medical therapy: Minimal Therapy (1 class of medications)  Non-Invasive Test Results: Intermediate-risk stress test findings: cardiac mortality 1-3%/year  Prior CABG: Previous CABG   Minimize dye to due renal insufficiency.   History and Physical Interval Note:  02/15/2019 2:53 PM  [name]  has presented today for surgery, with the diagnosis of Angina.  The various methods of treatment have been discussed with the patient and family. After consideration of risks, benefits and other options for treatment, the patient has consented to  Procedure(s): LEFT HEART CATH AND CORS/GRAFTS ANGIOGRAPHY (N/A) as a surgical intervention.  The patient's history has been reviewed, patient examined, no change in status, stable for surgery.  I have reviewed the patient's chart and labs.  Questions were answered to the patient's satisfaction.     Larae Grooms

## 2019-02-16 ENCOUNTER — Encounter (HOSPITAL_COMMUNITY): Payer: Self-pay | Admitting: Interventional Cardiology

## 2019-02-16 DIAGNOSIS — R0609 Other forms of dyspnea: Secondary | ICD-10-CM | POA: Diagnosis not present

## 2019-02-16 DIAGNOSIS — N183 Chronic kidney disease, stage 3 unspecified: Secondary | ICD-10-CM | POA: Diagnosis not present

## 2019-02-16 DIAGNOSIS — E785 Hyperlipidemia, unspecified: Secondary | ICD-10-CM | POA: Diagnosis not present

## 2019-02-16 DIAGNOSIS — E1122 Type 2 diabetes mellitus with diabetic chronic kidney disease: Secondary | ICD-10-CM | POA: Diagnosis not present

## 2019-02-16 DIAGNOSIS — Z7984 Long term (current) use of oral hypoglycemic drugs: Secondary | ICD-10-CM | POA: Diagnosis not present

## 2019-02-16 DIAGNOSIS — I5022 Chronic systolic (congestive) heart failure: Secondary | ICD-10-CM | POA: Diagnosis not present

## 2019-02-16 DIAGNOSIS — I25119 Atherosclerotic heart disease of native coronary artery with unspecified angina pectoris: Secondary | ICD-10-CM | POA: Diagnosis not present

## 2019-02-16 DIAGNOSIS — I130 Hypertensive heart and chronic kidney disease with heart failure and stage 1 through stage 4 chronic kidney disease, or unspecified chronic kidney disease: Secondary | ICD-10-CM | POA: Diagnosis not present

## 2019-02-16 DIAGNOSIS — Z7902 Long term (current) use of antithrombotics/antiplatelets: Secondary | ICD-10-CM | POA: Diagnosis not present

## 2019-02-16 DIAGNOSIS — Z87891 Personal history of nicotine dependence: Secondary | ICD-10-CM | POA: Diagnosis not present

## 2019-02-16 DIAGNOSIS — E1151 Type 2 diabetes mellitus with diabetic peripheral angiopathy without gangrene: Secondary | ICD-10-CM | POA: Diagnosis not present

## 2019-02-16 DIAGNOSIS — Z7982 Long term (current) use of aspirin: Secondary | ICD-10-CM | POA: Diagnosis not present

## 2019-02-16 LAB — BASIC METABOLIC PANEL
Anion gap: 9 (ref 5–15)
BUN: 13 mg/dL (ref 8–23)
CO2: 23 mmol/L (ref 22–32)
Calcium: 9 mg/dL (ref 8.9–10.3)
Chloride: 106 mmol/L (ref 98–111)
Creatinine, Ser: 1.41 mg/dL — ABNORMAL HIGH (ref 0.61–1.24)
GFR calc Af Amer: 50 mL/min — ABNORMAL LOW (ref >60)
GFR calc non Af Amer: 44 mL/min — ABNORMAL LOW (ref >60)
Glucose, Bld: 111 mg/dL — ABNORMAL HIGH (ref 70–99)
Potassium: 4.9 mmol/L (ref 3.5–5.1)
Sodium: 138 mmol/L (ref 135–145)

## 2019-02-16 LAB — CBC
HCT: 36.1 % — ABNORMAL LOW (ref 39.0–52.0)
Hemoglobin: 11.8 g/dL — ABNORMAL LOW (ref 13.0–17.0)
MCH: 31.2 pg (ref 26.0–34.0)
MCHC: 32.7 g/dL (ref 30.0–36.0)
MCV: 95.5 fL (ref 80.0–100.0)
Platelets: 224 10*3/uL (ref 150–400)
RBC: 3.78 MIL/uL — ABNORMAL LOW (ref 4.22–5.81)
RDW: 12.7 % (ref 11.5–15.5)
WBC: 11.2 10*3/uL — ABNORMAL HIGH (ref 4.0–10.5)
nRBC: 0 % (ref 0.0–0.2)

## 2019-02-16 LAB — GLUCOSE, CAPILLARY: Glucose-Capillary: 129 mg/dL — ABNORMAL HIGH (ref 70–99)

## 2019-02-16 LAB — POCT ACTIVATED CLOTTING TIME: Activated Clotting Time: 164 seconds

## 2019-02-16 MED ORDER — CITALOPRAM HYDROBROMIDE 40 MG PO TABS: 20.0000 mg | ORAL_TABLET | Freq: Every day | ORAL | Status: DC

## 2019-02-16 MED ORDER — MELATONIN 3 MG PO TABS
3.0000 mg | ORAL_TABLET | Freq: Once | ORAL | Status: AC
  Administered 2019-02-16: 3 mg via ORAL
  Filled 2019-02-16: qty 1

## 2019-02-16 MED ORDER — CITALOPRAM HYDROBROMIDE 20 MG PO TABS: 20.0000 mg | ORAL_TABLET | Freq: Every day | ORAL | Status: DC

## 2019-02-16 MED ORDER — CLOPIDOGREL BISULFATE 75 MG PO TABS: 75.0000 mg | ORAL_TABLET | Freq: Every day | ORAL | 11 refills | Status: DC

## 2019-02-16 MED FILL — CLOPIDOGREL 75 MG TABLET: 75 | 30 days supply | Qty: 30 | Fill #0

## 2019-02-16 NOTE — Discharge Summary (Signed)
Discharge Summary    Patient ID: [name] MRN: [id number]; DOB: [birth year]  Admit date: 02/15/2019 Discharge date: 02/16/2019  Primary Care Provider: Glenda Chroman, MD  Primary Cardiologist: Kate Sable, MD  Primary Electrophysiologist:  None   Discharge Diagnoses    Principal Problem:   DOE (dyspnea on exertion) Active Problems:   Diabetes mellitus type 2 in nonobese [facility])   Dyslipidemia   Hypertension   CAD (coronary artery disease) of artery bypass graft   CKD (chronic kidney disease), stage III   Allergies Allergies  Allergen Reactions   Sulfonamide Derivatives Rash   Tetracycline Rash    Diagnostic Studies/Procedures    Cardiac Catheterization 02/15/19 Conclusion   Prox RCA lesion is 75% stenosed.  RPDA lesion is 99% stenosed. SVG to PDA is patent with stenosis and was treated.  Ost LAD to Prox LAD lesion is 95% stenosed.  2nd Mrg lesion is 100% stenosed.  Mid LAD lesion is 100% stenosed. Jump graft LIMA to diag/LAD is widely patent.  SVG to PDA Origin to Prox Graft lesion before RPDA is 80% stenosed.  A drug-eluting stent was successfully placed using a STENT SYNERGY DES 3.5X20.  Post intervention, there is a 5% residual stenosis.  Jump graft between RPDA and RPAV is 100% stenosed.  LV end diastolic pressure is moderately elevated.  There is no aortic valve stenosis.   Severe three vessel disease.  Patent grafts to OM, LAD and diagonal.  PCI to SVG to PDA.    Continue DAPT with aggressive secondary prevention.  Watch overnight on telemetry.  Likely discharge tomorrow.  Minimal fluids post cath due to LV dysfunction and moderately elevated LVEDP.   Hold ACE-I until creatinine rechecked.      _____________   History of Present Illness     [name] is a 85 y.o. male with CAD s/p CABG 1994, CKD stage III, chronic combined CHF, bradycardia, mildly dilated aortic root, skin cancer, carotid artery disease, DM,  dyslipidemia, HTN, nephrolithasis, PVD, prior TIA who presented to [facility] for planned cath.  Hospital Course     Prior CABG history noted above. He had a cath in 2003 with patent grafts. Last echo 05/2018 showed LVEF 35-40%, impaired LV relaxation c/w diastolic dysfunction, diffuse hypokinesis, mildly dilated left atrium, trivial AI, mildly dilated aortic root. Recently he's been evaluated for increasing exertional dyspnea and fatigue as well as leg swelling. This did not improve with titration of Lasix, therefore cardiac cath was recommended out of concern that this was related to ischemia. Pre-cath labs demonstrated a creatinine of 1.42. He was admitted planned cath 02/15/19.  He underwent this procedure yesterday demonstrating severe 3V native disease and patent grafts to OM, LAD and diagonal but 80% lesion in SVG-PDA. He had PCI/DES to the SVG-PDA. He was started on Plavix with recommendation for uninterrupted dual antiplatelet therapy with Aspirin 81mg  daily and Clopidogrel 75mg  daily for a minimum of 6 months. LVEDP was moderately elevated so only got minimal post-cath fluids. He did well overnight. This morning he ambulated with cardiac rehab, see their note. He was encouraged to use his walker at home given fall risk. He declined PT eval for safety resources at home. He declined phase 2 cardiac rehab. Dr. Harrington Challenger has seen and examined the patient today and feels he is stable for discharge.  Regarding medication plan, a few changes which I personally reviewed with his son at the patient's request. Instructions also relayed on AVS. - his blood pressure was  a little soft overnight at 98/63 so per discussion with Dr. Harrington Challenger, she recommends to hold his Lasix and benazepril until seen in close follow-up which we have arranged for Wednesday 10/28 - would recommend BMET be obtained at that visit. This will be at [facility] office which patient/son are agreeable with. - potassium level has been at the upper  limits of normal recently so KCl supplement was stopped (K was 4.9 this AM after holding his Lasix and potassium, has been 5.0 as outpatient) - given his advanced age and balance issues we did not titrate atorvastatin dose at present time - given interaction between Plavix and Celexa, Dr. Harrington Challenger recommended to reduce Celexa dose to 20mg  daily (this is the maximum safe dose while on comcomitant Plavix). The patient's son declined new rx and states they will use pill cutter to cut what they have at home. (Pill picture guide show 40mg  tablets are typically scored.) He was advised to follow up closely with primary care for ongoing management given this change - pt was advised to hold Metformin for at least 48 hours post cath and resume the on 02/18/19  ------  Did the patient have an acute coronary syndrome (MI, NSTEMI, STEMI, etc) this admission?:  No                               Did the patient have a percutaneous coronary intervention (stent / angioplasty)?:  Yes.     Cath/PCI Registry Performance & Quality Measures: 1. Aspirin prescribed? - Yes 2. ADP Receptor Inhibitor (Plavix/Clopidogrel, Brilinta/Ticagrelor or Effient/Prasugrel) prescribed (includes medically managed patients)? - Yes 3. High Intensity Statin (Lipitor 40-80mg  or Crestor 20-40mg ) prescribed? - No - advanced age, balance issues, and concern that multiple medication changes could confuse patient/family upon discharge - can revisit as outpatient 4. For EF <40%, was ACEI/ARB prescribed? - No - Reason:  benazepril held due to hypotension 5. For EF <40%, Aldosterone Antagonist (Spironolactone or Eplerenone) prescribed? - No - Reason:  hypotension 6. Cardiac Rehab Phase II ordered (Included Medically managed Patients)? - No - referred, but patient felt not appropriate due to falls and also patient declined   _____________  Discharge Vitals Blood pressure 115/67, pulse 75, temperature 98.2 F (36.8 C), temperature source Oral, resp.  rate 20, height 5\' 11"  (1.803 m), weight 90.2 kg, SpO2 94 %.  Filed Weights   02/15/19 0819 02/16/19 0532  Weight: 90.3 kg 90.2 kg    Labs & Radiologic Studies    CBC Recent Labs    02/16/19 0418  WBC 11.2*  HGB 11.8*  HCT 36.1*  MCV 95.5  PLT [id number]   Basic Metabolic Panel Recent Labs    02/16/19 0418  NA 138  K 4.9  CL 106  CO2 23  GLUCOSE 111*  BUN 13  CREATININE 1.41*  CALCIUM 9.0  _____________  No results found. Disposition   Pt is being discharged home today in good condition.  Follow-up Plans & Appointments    Follow-up Information    Erma Heritage, PA-C Follow up.   Specialties: Physician Assistant, Cardiology Why: [facility] - this is the [place] location in [facility] - see appointment information below for 02/21/19. [name] is one of the PAs that works closely with Dr. Bronson Ing. Contact information: [place] [address] [phone]        Glenda Chroman, MD Follow up.   Specialty: Internal Medicine Why:  Within 1 week to discuss how you are doing on the change in your Celexa/citalopram  Contact information: [place] [address] [phone]          Discharge Instructions    Amb Referral to Cardiac Rehabilitation   Complete by: As directed    Diagnosis: Coronary Stents   After initial evaluation and assessments completed: Virtual Based Care may be provided alone or in conjunction with Phase 2 Cardiac Rehab based on patient barriers.: Yes   Diet - low sodium heart healthy   Complete by: As directed    Discharge instructions   Complete by: As directed    Your blood pressure was on the low side at times so you will stop your benazepril and furosemide (fluid pill) until seen back in the office. Please monitor your weight daily and symptoms. If you notice a 3 pound weight gain, increased swelling or shortness of breath before your follow-up, please call. Dr. Court Joy office.  Your  potassium level has been at the upper limits of normal recently so we have stopped your potassium supplement.  You were started on a new blood thinner called clopidogrel/Plavix. There is a drug interaction between your new medicine, clopidogrel/Plavix, and the medication you take called citalopram/Celexa. The maximum dose of citalopram allowed is 20mg  daily while taking the blood thinner. In talking with your son, it sounds like the plan will be for you to please cut your current citalopram/Celexa prescription in half with a pill cutter. Please see your primary care provider within 1 week to follow how you are doing on this medication.  Please take note that your metformin needs to be held for at least 48 hours after a heart catheterization. You can restart this on 02/18/19.   Increase activity slowly   Complete by: As directed    No driving until cleared by your provider in follow-up (if you have previously been advised not to drive for other reasons, you will also need clearance from your general medical provider). No lifting over 5 lbs for 1 week. No sexual activity for 1 week. Keep procedure site clean & dry. If you notice increased pain, swelling, bleeding or pus, call/return!  You may shower, but no soaking baths/hot tubs/pools for 1 week.      Discharge Medications   Allergies as of 02/16/2019      Reactions   Sulfonamide Derivatives Rash   Tetracycline Rash      Medication List    STOP taking these medications   benazepril 10 MG tablet Commonly known as: LOTENSIN   furosemide 40 MG tablet Commonly known as: LASIX   potassium chloride SA 20 MEQ tablet Commonly known as: KLOR-CON     TAKE these medications   aspirin EC 81 MG tablet Take 1 tablet (81 mg total) by mouth daily.   atorvastatin 20 MG tablet Commonly known as: LIPITOR Take 20 mg by mouth daily.   cetirizine 10 MG tablet Commonly known as: ZYRTEC Take 1 tablet by mouth daily as needed for allergies.     citalopram 40 MG tablet Commonly known as: CELEXA Take 0.5 tablets (20 mg total) by mouth daily. Please cut your existing tablets with a pill cutter. If you have any difficulty, let your primary care doctor know. What changed:   how much to take  additional instructions   clopidogrel 75 MG tablet Commonly known as: PLAVIX Take 1 tablet (75 mg total) by mouth daily.   diazepam 5 MG tablet Commonly  known as: VALIUM Take 5 mg by mouth at bedtime as needed. For nerves   metFORMIN 500 MG tablet Commonly known as: GLUCOPHAGE Take 500 mg by mouth at bedtime. Notes to patient: Please take note that your metformin needs to be held for at least 48 hours after a heart catheterization. You can restart this on 02/18/19.   metoprolol succinate 25 MG 24 hr tablet Commonly known as: TOPROL-XL TAKE 1 TABLET BY MOUTH EVERY DAY   multivitamin tablet Take 1 tablet by mouth daily.   nitroGLYCERIN 0.4 MG SL tablet Commonly known as: NITROSTAT Place 1 tablet (0.4 mg total) under the tongue every 5 (five) minutes as needed for chest pain.   ranolazine 1000 MG SR tablet Commonly known as: RANEXA TAKE 1 TABLET BY MOUTH TWICE DAILY   rOPINIRole 1 MG tablet Commonly known as: REQUIP Take 1 mg by mouth 2 (two) times daily. Take 1 tab in AM and 2 PM        Outstanding Labs/Studies   Recommend BMET be arranged at time of OV 10/28  Duration of Discharge Encounter   Greater than 30 minutes including physician time.  Signed, Charlie Pitter, PA-C 02/16/2019, 12:27 PM

## 2019-02-16 NOTE — Progress Notes (Addendum)
Progress Note  Patient Name: [name] Date of Encounter: 02/16/2019  Primary Cardiologist: Kate Sable, MD   Subjective   No CP  No SOB    Inpatient Medications    Scheduled Meds: . aspirin EC  81 mg Oral Daily  . atorvastatin  20 mg Oral Daily  . citalopram  40 mg Oral Daily  . clopidogrel  75 mg Oral Q breakfast  . loratadine  10 mg Oral Daily  . metoprolol succinate  25 mg Oral Daily  . potassium chloride SA  20 mEq Oral Daily  . rOPINIRole  1 mg Oral BID  . sodium chloride flush  3 mL Intravenous Q12H  . sodium chloride flush  3 mL Intravenous Q12H   Continuous Infusions: . sodium chloride     PRN Meds: sodium chloride, acetaminophen, nitroGLYCERIN, ondansetron (ZOFRAN) IV, sodium chloride flush   Vital Signs    Vitals:   02/15/19 2237 02/16/19 0001 02/16/19 0532 02/16/19 0911  BP: 128/70 140/71 98/63 115/67  Pulse:  87 71 75  Resp: 18 20 20    Temp:  98.5 F (36.9 C) 98.2 F (36.8 C)   TempSrc:  Oral Oral   SpO2: 98% 98% 94%   Weight:   90.2 kg   Height:        Intake/Output Summary (Last 24 hours) at 02/16/2019 1035 Last data filed at 02/16/2019 1000 Gross per 24 hour  Intake 240 ml  Output 600 ml  Net -360 ml   Last 3 Weights 02/16/2019 02/15/2019 02/08/2019  Weight (lbs) 198 lb 13.7 oz 199 lb 199 lb  Weight (kg) 90.2 kg 90.266 kg 90.266 kg      Telemetry    SR  - Personally Reviewed  ECG    Physical Exam   GEN: No acute distress.   Neck: No JVD Cardiac: RRR, no murmurs, rubs, or gallops.  Respiratory: Clear to auscultation bilaterally. GI: Soft, nontender, non-distended  MS: No edema  R groin without hematoma or bruit  No deformity.   Labs    High Sensitivity Troponin:  No results for input(s): TROPONINIHS in the last 720 hours.    Chemistry Recent Labs  Lab 02/13/19 1037 02/16/19 0418  NA 137 138  K 5.0 4.9  CL 102 106  CO2 26 23  GLUCOSE 209* 111*  BUN 20 13  CREATININE 1.42* 1.41*  CALCIUM 9.2 9.0   GFRNONAA 43* 44*  GFRAA 50* 50*  ANIONGAP 9 9     Hematology Recent Labs  Lab 02/13/19 1037 02/16/19 0418  WBC 9.3 11.2*  RBC 4.21* 3.78*  HGB 13.2 11.8*  HCT 41.4 36.1*  MCV 98.3 95.5  MCH 31.4 31.2  MCHC 31.9 32.7  RDW 12.6 12.7  PLT 255 224    BNPNo results for input(s): BNP, PROBNP in the last 168 hours.   DDimer No results for input(s): DDIMER in the last 168 hours.   Radiology    No results found.  Cardiac Studies   Prox RCA lesion is 75% stenosed.  RPDA lesion is 99% stenosed. SVG to PDA is patent with stenosis and was treated.  Ost LAD to Prox LAD lesion is 95% stenosed.  2nd Mrg lesion is 100% stenosed.  Mid LAD lesion is 100% stenosed. Jump graft LIMA to diag/LAD is widely patent.  SVG to PDA Origin to Prox Graft lesion before RPDA is 80% stenosed.  A drug-eluting stent was successfully placed using a STENT SYNERGY DES 3.5X20.  Post intervention, there is  a 5% residual stenosis.  Jump graft between RPDA and RPAV is 100% stenosed.  LV end diastolic pressure is moderately elevated.  There is no aortic valve stenosis.   Severe three vessel disease.  Patent grafts to OM, LAD and diagonal.  PCI to SVG to PDA.    Continue DAPT with aggressive secondary prevention.  Watch overnight on telemetry.  Likely discharge tomorrow.  Minimal fluids post cath due to LV dysfunction and moderately elevated LVEDP.   Hold ACE-I until creatinine rechecked.   Patient Profile      Assessment & Plan    1  CAD   Cath as noted above  Pt underwent intervention   Plan for DAPT   Continue other meds   2  Chronic systolic CHF  LVEF 35 to [id number]   Volume OK   Hold benazepril for now    Reassess BP and renal function next wekk in clinic before resumeing    3  HTN   Pt on benazepriol, Toprol XL   Benazapril held  Since cath   I would probably keep off for now   His BP is a little low   REassess as outpt   Presere perfusion    4  CKD   Cr 1.43 today    Follow as outpt   Hold lasix and K for now   FOllow up Cr and BP and volume status next week before resuming    5  HL  Continue lipitor    6   Neuro/Psych     On Celexa   Will decrease dose given interaction with Plavix  PCP to review  For questions or updates, please contact [facility] Please consult [email or web address] for contact info under        Signed, Dorris Carnes, MD  02/16/2019, 10:35 AM

## 2019-02-16 NOTE — Progress Notes (Signed)
CARDIAC REHAB PHASE I   PRE:  Rate/Rhythm: 75 SR  BP:  Supine:   Sitting: 137/81  Standing:    SaO2: 96%RA  MODE:  Ambulation: 350 ft   POST:  Rate/Rhythm: 74 SR  BP:  Supine:   Sitting: 125/93  Standing:    SaO2: 97%RA [id number] Assisted pt from bathroom. He had large BM. Pt walked 350 ft on RA with rolling walker, gait belt and asst x 1. Encouraged him to stay close to walker. Was steady today but pt stated he falls all the time. He has rollator at home. Offered PT services for safety eval at home but he declined. Son stated pt has been steadier when using walker. Discussed with pt that he needs to use walker or rollator at home as he is more at risk for bleed. Discussed with pt that he is on plavix and that it is important to keep stent open but he will bleed more easily. Reviewed NTG use but pt stated he doesn't like to use because of extreme headache. Encouraged him to call 911 with CP then. Gave low sodium diets with low EF and encouraged pt to watch. Referred to [place] [facility] but pt not appropriate due to many falls and also pt does not want to attend. Son in room with pt who is sitting in chair.   Graylon Good, RN BSN  02/16/2019 9:00 AM

## 2019-02-19 ENCOUNTER — Telehealth: Payer: Self-pay | Admitting: Cardiovascular Disease

## 2019-02-19 NOTE — Telephone Encounter (Signed)
[name] - daughter called wanting to know if the compression patch can be removed. Patient had catheretization 02-15-2019

## 2019-02-19 NOTE — Telephone Encounter (Signed)
[name] (daughter) notified she can remove bandage from groin area as it has been greater than 24 hours.  She verbalized understanding.

## 2019-02-21 ENCOUNTER — Other Ambulatory Visit: Payer: Self-pay

## 2019-02-21 ENCOUNTER — Ambulatory Visit (INDEPENDENT_AMBULATORY_CARE_PROVIDER_SITE_OTHER): Payer: Medicare Other | Admitting: Student

## 2019-02-21 ENCOUNTER — Other Ambulatory Visit (HOSPITAL_COMMUNITY)
Admission: RE | Admit: 2019-02-21 | Discharge: 2019-02-21 | Disposition: A | Discharge status: Home / Self Care | Payer: Medicare Other | Source: Ambulatory Visit | Attending: Student | Admitting: Student

## 2019-02-21 ENCOUNTER — Encounter: Payer: Self-pay | Admitting: Student

## 2019-02-21 VITALS — BP 125/68 | HR 85 | Temp 97.1°F | Ht 71.0 in | Wt 202.0 lb

## 2019-02-21 DIAGNOSIS — I2510 Atherosclerotic heart disease of native coronary artery without angina pectoris: Secondary | ICD-10-CM | POA: Diagnosis not present

## 2019-02-21 DIAGNOSIS — E785 Hyperlipidemia, unspecified: Secondary | ICD-10-CM | POA: Diagnosis not present

## 2019-02-21 DIAGNOSIS — I10 Essential (primary) hypertension: Secondary | ICD-10-CM

## 2019-02-21 DIAGNOSIS — N183 Chronic kidney disease, stage 3 unspecified: Secondary | ICD-10-CM

## 2019-02-21 DIAGNOSIS — I5042 Chronic combined systolic (congestive) and diastolic (congestive) heart failure: Secondary | ICD-10-CM | POA: Diagnosis not present

## 2019-02-21 DIAGNOSIS — I25708 Atherosclerosis of coronary artery bypass graft(s), unspecified, with other forms of angina pectoris: Secondary | ICD-10-CM | POA: Diagnosis not present

## 2019-02-21 LAB — BASIC METABOLIC PANEL
Anion gap: 9 (ref 5–15)
BUN: 14 mg/dL (ref 8–23)
CO2: 24 mmol/L (ref 22–32)
Calcium: 8.6 mg/dL — ABNORMAL LOW (ref 8.9–10.3)
Chloride: 102 mmol/L (ref 98–111)
Creatinine, Ser: 1.2 mg/dL (ref 0.61–1.24)
GFR calc Af Amer: >60 mL/min (ref >60)
GFR calc non Af Amer: 53 mL/min — ABNORMAL LOW (ref >60)
Glucose, Bld: 151 mg/dL — ABNORMAL HIGH (ref 70–99)
Potassium: 4.4 mmol/L (ref 3.5–5.1)
Sodium: 135 mmol/L (ref 135–145)

## 2019-02-21 MED ORDER — CLOPIDOGREL BISULFATE 75 MG PO TABS: 75.0000 mg | ORAL_TABLET | Freq: Every day | ORAL | 3 refills | Status: DC

## 2019-02-21 NOTE — Progress Notes (Signed)
Cardiology Office Note    Date:  02/21/2019   ID:  Demarre, Cavaco 01-03-1927, MRN [id number]  PCP:  Glenda Chroman, MD  Cardiologist: Kate Sable, MD    Chief Complaint  Patient presents with  . Follow-up    s/p cardiac catheterization    History of Present Illness:    [name] is a 85 y.o. male with past medical history of CAD (s/p CABG in 1994 with patent grafts by cath in 2003), chronic combined systolic and diastolic CHF (EF 35 to [id number] by echo in 05/2018), baseline bradycardia, carotid artery stenosis, HTN, HLD, Type 2 DM, Stage 3 CKD and prior TIA who presents to the office today for follow-up from his recent cardiac catheterization.  He most recently had a telehealth visit with Dr. Bronson Ing on 02/08/2019 and reported still having worsening dyspnea on exertion and fatigue. Lasix had been increased which helped with his edema but did not influence his dyspnea. Options were reviewed and a cardiac catheterization was recommended for definitive evaluation. This was performed by Dr. Irish Lack on 02/15/2019 and showed severe 3-vessel CAD with patent grafts to OM, LAD and diagonal but was noted to have 80% stenosis along SVG-PDA which was treated with PCI/DES placement. It was recommended to be on DAPT with ASA and Plavix for a minimum of 6 months. LVEDP was moderately elevated therefore he received minimal post-cath fluids. He did experience hypotension following the procedure and both Lasix and Benzapril were held until his follow-up visit. K-dur dosing was also held and Celexa dosing was reduced to 20 mg daily given concurrent use of Plavix.  In talking with the patient and his daughter today, he reports improvement in his symptoms following stent placement and denies any recurrent chest pain. He does still have dyspnea on exertion which improved slightly. He denies any specific orthopnea or PND. He has experienced worsening lower extremity edema since Lasix was  discontinued at the time of discharge but he reports weight has overall been stable.  His daughter manages his medications and says he has been compliant with these since discharge. Denies missing any recent doses of ASA or Plavix.   Past Medical History:  Diagnosis Date  . Aortic insufficiency    a. previously mild in 2012. b. trivial seen on echo in 05/2018.  . Asbestos exposure   . Bradycardia    MILD  . CAD (coronary artery disease) 2003   a. s/p CABG in 1994. b. Cath 2003 patent grafts. c. DOE in 2020 felt anginal equivalent - cath 01/2019 s/p PCI/DES to the SVG-PDA.  . Cancer ([place])    Skin  . Carotid artery disease ([place]) 2001   BILATERAL 0-39%/  Follow with Dopplers by Dr. Woody Seller  . Chronic combined systolic and diastolic CHF (congestive heart failure) ([place])   . CKD (chronic kidney disease), stage III   . Colitis   . Diabetes mellitus   . Dilated aortic root ([place]) 05/2018  . Dizziness    He has had falls related to this / Chronic dizziness  . Dyslipidemia   . Hx of CABG   . Hypertension   . Nephrolithiasis   . PVD (peripheral vascular disease) ([place])   . Renal cyst   . TIA (transient ischemic attack) 2007   ?? medical Rx     Past Surgical History:  Procedure Laterality Date  . CORONARY ARTERY BYPASS GRAFT    . CORONARY STENT INTERVENTION N/A 02/15/2019   Procedure: CORONARY STENT INTERVENTION;  Surgeon: Jettie Booze, MD;  Location: [facility];  Service: Cardiovascular;  Laterality: N/A;  SEQ SVG PDA-PL  . LEFT HEART CATH AND CORS/GRAFTS ANGIOGRAPHY N/A 02/15/2019   Procedure: LEFT HEART CATH AND CORS/GRAFTS ANGIOGRAPHY;  Surgeon: Jettie Booze, MD;  Location: [facility];  Service: Cardiovascular;  Laterality: N/A;  . percutaneous transluminal coronary angioplasty     hx    Current Medications: Outpatient Medications Prior to Visit  Medication Sig Dispense Refill  . aspirin EC 81 MG tablet Take 1 tablet (81 mg total) by mouth daily.     Marland Kitchen atorvastatin (LIPITOR) 20 MG tablet Take 20 mg by mouth daily.  4  . cetirizine (ZYRTEC) 10 MG tablet Take 1 tablet by mouth daily as needed for allergies.   5  . citalopram (CELEXA) 40 MG tablet Take 0.5 tablets (20 mg total) by mouth daily. Please cut your existing tablets with a pill cutter. If you have any difficulty, let your primary care doctor know.    . diazepam (VALIUM) 5 MG tablet Take 5 mg by mouth at bedtime as needed. For nerves     . metFORMIN (GLUCOPHAGE) 500 MG tablet Take 500 mg by mouth at bedtime.     . metoprolol succinate (TOPROL-XL) 25 MG 24 hr tablet TAKE 1 TABLET BY MOUTH EVERY DAY 30 tablet 6  . Multiple Vitamin (MULTIVITAMIN) tablet Take 1 tablet by mouth daily.      . nitroGLYCERIN (NITROSTAT) 0.4 MG SL tablet Place 1 tablet (0.4 mg total) under the tongue every 5 (five) minutes as needed for chest pain. 25 tablet 3  . ranolazine (RANEXA) 1000 MG SR tablet TAKE 1 TABLET BY MOUTH TWICE DAILY 60 tablet 6  . rOPINIRole (REQUIP) 1 MG tablet Take 1 mg by mouth 2 (two) times daily. Take 1 tab in AM and 2 PM    . clopidogrel (PLAVIX) 75 MG tablet Take 1 tablet (75 mg total) by mouth daily. 30 tablet 11   No facility-administered medications prior to visit.      Allergies:   Sulfonamide derivatives and Tetracycline   Social History   Socioeconomic History  . Marital status: Widowed    Spouse name: Not on file  . Number of children: 4  . Years of education: Not on file  . Highest education level: Not on file  Occupational History  . Occupation: RETIRED  Social Needs  . Financial resource strain: Not on file  . Food insecurity    Worry: Not on file    Inability: Not on file  . Transportation needs    Medical: Not on file    Non-medical: Not on file  Tobacco Use  . Smoking status: Former Smoker    Packs/day: 2.00    Years: 30.00    Pack years: 60.00    Types: Cigarettes    Start date: 12/23/1946    Quit date: 04/26/1980    Years since quitting: 38.8  .  Smokeless tobacco: Never Used  Substance and Sexual Activity  . Alcohol use: Not on file  . Drug use: Not on file  . Sexual activity: Not on file  Lifestyle  . Physical activity    Days per week: Not on file    Minutes per session: Not on file  . Stress: Not on file  Relationships  . Social Herbalist on phone: Not on file    Gets together: Not on file    Attends religious service:  Not on file    Active member of club or organization: Not on file    Attends meetings of clubs or organizations: Not on file    Relationship status: Not on file  Other Topics Concern  . Not on file  Social History Narrative  . Not on file     Family History:  The patient's family history includes Heart attack (age of onset: 53) in his mother.   Review of Systems:   Please see the history of present illness.     General:  No chills, fever, night sweats or weight changes.  Cardiovascular:  No chest pain, orthopnea, palpitations, paroxysmal nocturnal dyspnea. Positive for dyspnea on exertion and edema.  Dermatological: No rash, lesions/masses Respiratory: No cough, dyspnea Urologic: No hematuria, dysuria Abdominal:   No nausea, vomiting, diarrhea, bright red blood per rectum, melena, or hematemesis Neurologic:  No visual changes, wkns, changes in mental status. All other systems reviewed and are otherwise negative except as noted above.   Physical Exam:    VS:  BP 125/68   Pulse 85   Temp (!) 97.1 F (36.2 C) (Temporal)   Ht 5\' 11"  (1.803 m)   Wt 202 lb (91.6 kg)   SpO2 97%   BMI 28.17 kg/m    General: Well developed, well nourished,male appearing in no acute distress. Head: Normocephalic, atraumatic, sclera non-icteric, no xanthomas, nares are without discharge.  Neck: No carotid bruits. JVD not elevated.  Lungs: Respirations regular and unlabored, without wheezes or rales.  Heart: Regular rate and rhythm. No S3 or S4.  No murmur, no rubs, or gallops appreciated. Abdomen:  Soft, non-tender, non-distended with normoactive bowel sounds. No hepatomegaly. No rebound/guarding. No obvious abdominal masses. Msk:  Strength and tone appear normal for age. No joint deformities or effusions. Extremities: No clubbing or cyanosis. Trace lower extremity edema bilaterally.  Distal pedal pulses are 2+ bilaterally. Neuro: Alert and oriented X 3. Moves all extremities spontaneously. No focal deficits noted. Psych:  Responds to questions appropriately with a normal affect. Skin: No rashes or lesions noted  Wt Readings from Last 3 Encounters:  02/21/19 202 lb (91.6 kg)  02/16/19 198 lb 13.7 oz (90.2 kg)  02/08/19 199 lb (90.3 kg)     Studies/Labs Reviewed:   EKG:  EKG is not ordered today.    Recent Labs: 02/16/2019: Hemoglobin 11.8; Platelets 224 02/21/2019: BUN 14; Creatinine, Ser 1.20; Potassium 4.4; Sodium 135   Lipid Panel No results found for: CHOL, TRIG, HDL, CHOLHDL, VLDL, LDLCALC, LDLDIRECT  Additional studies/ records that were reviewed today include:   Echocardiogram: 05/2018 IMPRESSIONS   1. The left ventricle has moderately reduced systolic function, with an ejection fraction of 35-40%. The cavity size was normal. There is mildly increased left ventricular wall thickness. Left ventricular diastolic Doppler parameters are consistent with  impaired relaxation Indeterminent filling pressures Left ventricular diffuse hypokinesis.  2. Left atrial size was mildly dilated.  3. The mitral valve is normal in structure. There is mild mitral annular calcification present.  4. The tricuspid valve is normal in structure.  5. The aortic valve is tricuspid There is mild thickening and sclerosis without any evidence of stenosis of the aortic valve. Aortic valve regurgitation is trivial by color flow Doppler.  6. There is mild dilatation of the aortic root.  7. Right atrial pressure is estimated at 10 mmHg.  8. The interatrial septum was not well visualized.    Cardiac Catheterization: 02/15/2019  Prox RCA lesion is 75% stenosed.  RPDA lesion is 99% stenosed. SVG to PDA is patent with stenosis and was treated.  Ost LAD to Prox LAD lesion is 95% stenosed.  2nd Mrg lesion is 100% stenosed.  Mid LAD lesion is 100% stenosed. Jump graft LIMA to diag/LAD is widely patent.  SVG to PDA Origin to Prox Graft lesion before RPDA is 80% stenosed.  A drug-eluting stent was successfully placed using a STENT SYNERGY DES 3.5X20.  Post intervention, there is a 5% residual stenosis.  Jump graft between RPDA and RPAV is 100% stenosed.  LV end diastolic pressure is moderately elevated.  There is no aortic valve stenosis.   Severe three vessel disease.  Patent grafts to OM, LAD and diagonal.  PCI to SVG to PDA.    Continue DAPT with aggressive secondary prevention.  Watch overnight on telemetry.  Likely discharge tomorrow.  Minimal fluids post cath due to LV dysfunction and moderately elevated LVEDP.   Hold ACE-I until creatinine rechecked.    Assessment:    1. Coronary artery disease involving native coronary artery of native heart without angina pectoris   2. Chronic combined systolic and diastolic heart failure ([place])   3. Essential hypertension   4. Hyperlipidemia LDL goal <70   5. Stage 3 chronic kidney disease, unspecified whether stage 3a or 3b CKD      Plan:   In order of problems listed above:  1. CAD - s/p CABG in 1994 with patent grafts by cath in 2003. Catheterization earlier this month showed patent grafts to OM, LAD and diagonal but was noted to have 80% stenosis along SVG-PDA which was treated with PCI/DES placement. - He denies any recurrent chest pain.  He is still having dyspnea on exertion which has improved slightly. - Continue current medication regimen at this time with ASA and Plavix.  Also remains on beta-blocker and statin therapy.  2. Chronic Combined Systolic and Diastolic CHF - EF was reduced at 35 to 40% by  echocardiogram in 05/2018. He denies any recent orthopnea or PND but has been experiencing worsening lower extremity edema since Lasix was discontinued following his catheterization. Will recheck a BMET today. If renal function remains stable, would plan to restart Lasix 40 mg daily and recheck a BMET in 2 weeks given elevated LVEDP by cath and edema on examination. - Continue Toprol-XL 25 mg daily. Benzapril was discontinued following recent catheterization given hypotension and renal function. Can hopefully restart in the future if renal function remains stable following reinitiation of diuretic therapy.    3. HTN - BP is well controlled at 125/68 during today's visit.  Continue Toprol-XL 25 mg daily.    4. HLD - Followed by PCP. Goal LDL is less than 70 in the setting of known CAD. He remains on Atorvastatin 20 mg daily.    5. Stage 3 CKD - Baseline creatinine 1.4 - 1.5. Will recheck BMET today.    Medication Adjustments/Labs and Tests Ordered: Current medicines are reviewed at length with the patient today.  Concerns regarding medicines are outlined above.  Medication changes, Labs and Tests ordered today are listed in the Patient Instructions below. Patient Instructions  Medication Instructions:  Your physician recommends that you continue on your current medications as directed. Please refer to the Current Medication list given to you today.  *If you need a refill on your cardiac medications before your next appointment, please call your pharmacy*  Lab Work: Your physician recommends that you return for lab work in: Today Artist)  If you have labs (blood work) drawn today and your tests are completely normal, you will receive your results only by: Marland Kitchen MyChart Message (if you have MyChart) OR . A paper copy in the mail If you have any lab test that is abnormal or we need to change your treatment, we will call you to review the results.  Testing/Procedures: NONE   Follow-Up: At  [facility], you and your health needs are our priority.  As part of our continuing mission to provide you with exceptional heart care, we have created designated Provider Care Teams.  These Care Teams include your primary Cardiologist (physician) and Advanced Practice Providers (APPs -  Physician Assistants and Nurse Practitioners) who all work together to provide you with the care you need, when you need it.  Your next appointment:   In December   The format for your next appointment:   In Person  Provider:   Kate Sable, MD  Other Instructions Thank you for choosing [facility]!    Signed, Erma Heritage, PA-C  02/21/2019 5:37 PM    [place] [address] [place],  [address] Phone: [phone] Fax: [phone]

## 2019-02-21 NOTE — Patient Instructions (Signed)
Medication Instructions:  Your physician recommends that you continue on your current medications as directed. Please refer to the Current Medication list given to you today.  *If you need a refill on your cardiac medications before your next appointment, please call your pharmacy*  Lab Work: Your physician recommends that you return for lab work in: Today Artist)    If you have labs (blood work) drawn today and your tests are completely normal, you will receive your results only by: Marland Kitchen MyChart Message (if you have MyChart) OR . A paper copy in the mail If you have any lab test that is abnormal or we need to change your treatment, we will call you to review the results.  Testing/Procedures: NONE   Follow-Up: At [facility], you and your health needs are our priority.  As part of our continuing mission to provide you with exceptional heart care, we have created designated Provider Care Teams.  These Care Teams include your primary Cardiologist (physician) and Advanced Practice Providers (APPs -  Physician Assistants and Nurse Practitioners) who all work together to provide you with the care you need, when you need it.  Your next appointment:   In December   The format for your next appointment:   In Person  Provider:   Kate Sable, MD  Other Instructions Thank you for choosing [facility]!

## 2019-02-22 ENCOUNTER — Telehealth: Payer: Self-pay | Admitting: *Deleted

## 2019-02-22 DIAGNOSIS — Z6828 Body mass index (BMI) 28.0-28.9, adult: Secondary | ICD-10-CM | POA: Diagnosis not present

## 2019-02-22 DIAGNOSIS — I10 Essential (primary) hypertension: Secondary | ICD-10-CM | POA: Diagnosis not present

## 2019-02-22 DIAGNOSIS — I5042 Chronic combined systolic (congestive) and diastolic (congestive) heart failure: Secondary | ICD-10-CM | POA: Diagnosis not present

## 2019-02-22 DIAGNOSIS — E1165 Type 2 diabetes mellitus with hyperglycemia: Secondary | ICD-10-CM | POA: Diagnosis not present

## 2019-02-22 DIAGNOSIS — I4891 Unspecified atrial fibrillation: Secondary | ICD-10-CM | POA: Diagnosis not present

## 2019-02-22 DIAGNOSIS — I25708 Atherosclerosis of coronary artery bypass graft(s), unspecified, with other forms of angina pectoris: Secondary | ICD-10-CM | POA: Diagnosis not present

## 2019-02-22 DIAGNOSIS — Z299 Encounter for prophylactic measures, unspecified: Secondary | ICD-10-CM | POA: Diagnosis not present

## 2019-02-22 MED ORDER — FUROSEMIDE 40 MG PO TABS: 40.0000 mg | ORAL_TABLET | Freq: Every day | ORAL | 0 refills | Status: DC

## 2019-02-22 NOTE — Telephone Encounter (Signed)
-----   Message from Erma Heritage, [place] sent at 02/22/2019  7:40 AM EDT ----- Please let the patient and his daughter know that his renal function has improved his creatinine was previously elevated 1.41 and is now at 1.20.  Given his volume overload on examination, would recommend restarting Lasix 40mg  daily.  Would recheck a BMET in 2 weeks to reassess kidney function and potassium levels. Please forward a copy of results to Glenda Chroman, MD.

## 2019-02-22 NOTE — Telephone Encounter (Signed)
Daughter notified and would like to have labs done at PCP office.

## 2019-02-23 DIAGNOSIS — Z6827 Body mass index (BMI) 27.0-27.9, adult: Secondary | ICD-10-CM | POA: Diagnosis not present

## 2019-02-23 DIAGNOSIS — S51819A Laceration without foreign body of unspecified forearm, initial encounter: Secondary | ICD-10-CM | POA: Diagnosis not present

## 2019-02-23 DIAGNOSIS — Z299 Encounter for prophylactic measures, unspecified: Secondary | ICD-10-CM | POA: Diagnosis not present

## 2019-02-23 DIAGNOSIS — I4891 Unspecified atrial fibrillation: Secondary | ICD-10-CM | POA: Diagnosis not present

## 2019-02-23 DIAGNOSIS — N1832 Chronic kidney disease, stage 3b: Secondary | ICD-10-CM | POA: Diagnosis not present

## 2019-02-23 DIAGNOSIS — I10 Essential (primary) hypertension: Secondary | ICD-10-CM | POA: Diagnosis not present

## 2019-03-07 DIAGNOSIS — N1831 Chronic kidney disease, stage 3a: Secondary | ICD-10-CM | POA: Diagnosis not present

## 2019-04-03 DIAGNOSIS — Z6828 Body mass index (BMI) 28.0-28.9, adult: Secondary | ICD-10-CM | POA: Diagnosis not present

## 2019-04-03 DIAGNOSIS — Z299 Encounter for prophylactic measures, unspecified: Secondary | ICD-10-CM | POA: Diagnosis not present

## 2019-04-03 DIAGNOSIS — E1165 Type 2 diabetes mellitus with hyperglycemia: Secondary | ICD-10-CM | POA: Diagnosis not present

## 2019-04-03 DIAGNOSIS — I4891 Unspecified atrial fibrillation: Secondary | ICD-10-CM | POA: Diagnosis not present

## 2019-04-03 DIAGNOSIS — I10 Essential (primary) hypertension: Secondary | ICD-10-CM | POA: Diagnosis not present

## 2019-04-03 DIAGNOSIS — D649 Anemia, unspecified: Secondary | ICD-10-CM | POA: Diagnosis not present

## 2019-04-03 DIAGNOSIS — I25708 Atherosclerosis of coronary artery bypass graft(s), unspecified, with other forms of angina pectoris: Secondary | ICD-10-CM | POA: Diagnosis not present

## 2019-04-16 ENCOUNTER — Encounter: Payer: Self-pay | Admitting: Cardiovascular Disease

## 2019-04-16 ENCOUNTER — Ambulatory Visit (HOSPITAL_COMMUNITY)
Admission: RE | Admit: 2019-04-16 | Discharge: 2019-04-16 | Disposition: A | Discharge status: Home / Self Care | Payer: Medicare Other | Source: Ambulatory Visit | Attending: Cardiovascular Disease | Admitting: Cardiovascular Disease

## 2019-04-16 ENCOUNTER — Other Ambulatory Visit: Payer: Self-pay

## 2019-04-16 ENCOUNTER — Other Ambulatory Visit (HOSPITAL_COMMUNITY)
Admission: RE | Admit: 2019-04-16 | Discharge: 2019-04-16 | Disposition: A | Discharge status: Home / Self Care | Payer: Medicare Other | Source: Ambulatory Visit | Attending: Cardiovascular Disease | Admitting: Cardiovascular Disease

## 2019-04-16 ENCOUNTER — Ambulatory Visit (INDEPENDENT_AMBULATORY_CARE_PROVIDER_SITE_OTHER): Payer: Medicare Other | Admitting: Cardiovascular Disease

## 2019-04-16 ENCOUNTER — Encounter (HOSPITAL_COMMUNITY): Payer: Self-pay

## 2019-04-16 VITALS — BP 154/74 | HR 83 | Ht 71.0 in | Wt 202.0 lb

## 2019-04-16 DIAGNOSIS — N183 Chronic kidney disease, stage 3 unspecified: Secondary | ICD-10-CM

## 2019-04-16 DIAGNOSIS — I5042 Chronic combined systolic (congestive) and diastolic (congestive) heart failure: Secondary | ICD-10-CM | POA: Diagnosis not present

## 2019-04-16 DIAGNOSIS — I10 Essential (primary) hypertension: Secondary | ICD-10-CM

## 2019-04-16 DIAGNOSIS — E785 Hyperlipidemia, unspecified: Secondary | ICD-10-CM

## 2019-04-16 DIAGNOSIS — R0600 Dyspnea, unspecified: Secondary | ICD-10-CM

## 2019-04-16 DIAGNOSIS — I25708 Atherosclerosis of coronary artery bypass graft(s), unspecified, with other forms of angina pectoris: Secondary | ICD-10-CM | POA: Diagnosis not present

## 2019-04-16 DIAGNOSIS — R0609 Other forms of dyspnea: Secondary | ICD-10-CM

## 2019-04-16 LAB — BASIC METABOLIC PANEL
Anion gap: 13 (ref 5–15)
BUN: 20 mg/dL (ref 8–23)
CO2: 26 mmol/L (ref 22–32)
Calcium: 9.2 mg/dL (ref 8.9–10.3)
Chloride: 99 mmol/L (ref 98–111)
Creatinine, Ser: 1.25 mg/dL — ABNORMAL HIGH (ref 0.61–1.24)
GFR calc Af Amer: 58 mL/min — ABNORMAL LOW (ref >60)
GFR calc non Af Amer: 50 mL/min — ABNORMAL LOW (ref >60)
Glucose, Bld: 167 mg/dL — ABNORMAL HIGH (ref 70–99)
Potassium: 4 mmol/L (ref 3.5–5.1)
Sodium: 138 mmol/L (ref 135–145)

## 2019-04-16 LAB — CBC
HCT: 41.9 % (ref 39.0–52.0)
Hemoglobin: 13.6 g/dL (ref 13.0–17.0)
MCH: 31.3 pg (ref 26.0–34.0)
MCHC: 32.5 g/dL (ref 30.0–36.0)
MCV: 96.3 fL (ref 80.0–100.0)
Platelets: 260 10*3/uL (ref 150–400)
RBC: 4.35 MIL/uL (ref 4.22–5.81)
RDW: 13.3 % (ref 11.5–15.5)
WBC: 11.1 10*3/uL — ABNORMAL HIGH (ref 4.0–10.5)
nRBC: 0 % (ref 0.0–0.2)

## 2019-04-16 LAB — BRAIN NATRIURETIC PEPTIDE: B Natriuretic Peptide: 184 pg/mL — ABNORMAL HIGH (ref 0.0–100.0)

## 2019-04-16 MED ORDER — BENAZEPRIL HCL 10 MG PO TABS: 10.0000 mg | ORAL_TABLET | Freq: Every day | ORAL | 3 refills | Status: DC

## 2019-04-16 NOTE — Patient Instructions (Signed)
Medication Instructions:  START Benazepril 10 mg daily   *If you need a refill on your cardiac medications before your next appointment, please call your pharmacy*  Lab Work: Cbc, bmet, bnp today  If you have labs (blood work) drawn today and your tests are completely normal, you will receive your results only by: Marland Kitchen MyChart Message (if you have MyChart) OR . A paper copy in the mail If you have any lab test that is abnormal or we need to change your treatment, we will call you to review the results.  Testing/Procedures: Chest x-ray at [facility] today  Follow-Up: At [facility], you and your health needs are our priority.  As part of our continuing mission to provide you with exceptional heart care, we have created designated Provider Care Teams.  These Care Teams include your primary Cardiologist (physician) and Advanced Practice Providers (APPs -  Physician Assistants and Nurse Practitioners) who all work together to provide you with the care you need, when you need it.  Your next appointment:   6 week(s)  The format for your next appointment:   Virtual Visit   Provider:   Kate Sable, MD  Other Instructions None    Thank you for choosing [facility] !

## 2019-04-16 NOTE — Progress Notes (Signed)
SUBJECTIVE: [name] is a 85 y.o. male with past medical history of CAD (s/p CABG in 07/20/1992 with patent grafts by cath in 07/20/01), chronic combined systolic and diastolic CHF (EF 35 to [id number] by echo in 05/2018), baseline bradycardia, carotid artery stenosis, HTN, HLD, Type 2 DM, Stage 3 CKD and prior TIA.  He underwent cardiac catheterization on 02/15/2019 which showed severe 3-vessel CAD with patent grafts to OM, LAD and diagonal but was noted to have 80% stenosis along SVG-PDA which was treated with PCI/DES placement. It was recommended to be on DAPT with ASA and Plavix for a minimum of 6 months.  He denies chest pain.  He continues to complain of shortness of breath.  He did walk throughout [employer] the other day without any significant difficulties.  His primary complaints with respect to shortness of breath relate to mucus stuck in the back of his throat.  Weight is stable since October.  He denies orthopnea and paroxysmal nocturnal dyspnea.    Social history: His wife of 62 years passed away in 07-21-2014. His daughter, [name], lives with him now. He has a son as well.  His daughter used to work at [employer] in [employer].  Review of Systems: As per "subjective", otherwise negative.  Allergies  Allergen Reactions  . Sulfonamide Derivatives Rash  . Tetracycline Rash    Current Outpatient Medications  Medication Sig Dispense Refill  . aspirin EC 81 MG tablet Take 1 tablet (81 mg total) by mouth daily.    Marland Kitchen atorvastatin (LIPITOR) 20 MG tablet Take 20 mg by mouth daily.  4  . cetirizine (ZYRTEC) 10 MG tablet Take 1 tablet by mouth daily as needed for allergies.   5  . citalopram (CELEXA) 40 MG tablet Take 0.5 tablets (20 mg total) by mouth daily. Please cut your existing tablets with a pill cutter. If you have any difficulty, let your primary care doctor know.    . clopidogrel (PLAVIX) 75 MG tablet Take 1 tablet (75 mg total) by mouth daily. 90 tablet 3  . diazepam (VALIUM)  5 MG tablet Take 5 mg by mouth at bedtime as needed. For nerves     . furosemide (LASIX) 40 MG tablet Take 1 tablet (40 mg total) by mouth daily. 30 tablet 0  . metFORMIN (GLUCOPHAGE) 500 MG tablet Take 500 mg by mouth at bedtime.     . metoprolol succinate (TOPROL-XL) 25 MG 24 hr tablet TAKE 1 TABLET BY MOUTH EVERY DAY 30 tablet 6  . Multiple Vitamin (MULTIVITAMIN) tablet Take 1 tablet by mouth daily.      . nitroGLYCERIN (NITROSTAT) 0.4 MG SL tablet Place 1 tablet (0.4 mg total) under the tongue every 5 (five) minutes as needed for chest pain. 25 tablet 3  . ranolazine (RANEXA) 1000 MG SR tablet TAKE 1 TABLET BY MOUTH TWICE DAILY 60 tablet 6  . rOPINIRole (REQUIP) 1 MG tablet Take 1 mg by mouth 2 (two) times daily. Take 1 tab in AM and 2 PM     No current facility-administered medications for this visit.    Past Medical History:  Diagnosis Date  . Aortic insufficiency    a. previously mild in July 21, 2010. b. trivial seen on echo in 05/2018.  . Asbestos exposure   . Bradycardia    MILD  . CAD (coronary artery disease) July 20, 2001   a. s/p CABG in 20-Jul-1992. b. Cath 07-20-2001 patent grafts. c. DOE in 2018-07-21 felt anginal equivalent -  cath 01/2019 s/p PCI/DES to the SVG-PDA.  . Cancer ([place])    Skin  . Carotid artery disease ([place]) 2001   BILATERAL 0-39%/  Follow with Dopplers by Dr. Woody Seller  . Chronic combined systolic and diastolic CHF (congestive heart failure) ([place])   . CKD (chronic kidney disease), stage III   . Colitis   . Diabetes mellitus   . Dilated aortic root ([place]) 05/2018  . Dizziness    He has had falls related to this / Chronic dizziness  . Dyslipidemia   . Hx of CABG   . Hypertension   . Nephrolithiasis   . PVD (peripheral vascular disease) ([place])   . Renal cyst   . TIA (transient ischemic attack) 2007   ?? medical Rx     Past Surgical History:  Procedure Laterality Date  . CORONARY ARTERY BYPASS GRAFT    . CORONARY STENT INTERVENTION N/A 02/15/2019   Procedure: CORONARY STENT INTERVENTION;   Surgeon: Jettie Booze, MD;  Location: [facility];  Service: Cardiovascular;  Laterality: N/A;  SEQ SVG PDA-PL  . LEFT HEART CATH AND CORS/GRAFTS ANGIOGRAPHY N/A 02/15/2019   Procedure: LEFT HEART CATH AND CORS/GRAFTS ANGIOGRAPHY;  Surgeon: Jettie Booze, MD;  Location: [facility];  Service: Cardiovascular;  Laterality: N/A;  . percutaneous transluminal coronary angioplasty     hx    Social History   Socioeconomic History  . Marital status: Widowed    Spouse name: Not on file  . Number of children: 4  . Years of education: Not on file  . Highest education level: Not on file  Occupational History  . Occupation: RETIRED  Tobacco Use  . Smoking status: Former Smoker    Packs/day: 2.00    Years: 30.00    Pack years: 60.00    Types: Cigarettes    Start date: 12/23/1946    Quit date: 04/26/1980    Years since quitting: 38.9  . Smokeless tobacco: Never Used  Substance and Sexual Activity  . Alcohol use: Not on file  . Drug use: Not on file  . Sexual activity: Not on file  Other Topics Concern  . Not on file  Social History Narrative  . Not on file   Social Determinants of Health   Financial Resource Strain:   . Difficulty of Paying Living Expenses: Not on file  Food Insecurity:   . Worried About Charity fundraiser in the Last Year: Not on file  . Ran Out of Food in the Last Year: Not on file  Transportation Needs:   . Lack of Transportation (Medical): Not on file  . Lack of Transportation (Non-Medical): Not on file  Physical Activity:   . Days of Exercise per Week: Not on file  . Minutes of Exercise per Session: Not on file  Stress:   . Feeling of Stress : Not on file  Social Connections:   . Frequency of Communication with Friends and Family: Not on file  . Frequency of Social Gatherings with Friends and Family: Not on file  . Attends Religious Services: Not on file  . Active Member of Clubs or Organizations: Not on file  . Attends English as a second language teacher Meetings: Not on file  . Marital Status: Not on file  Intimate Partner Violence:   . Fear of Current or Ex-Partner: Not on file  . Emotionally Abused: Not on file  . Physically Abused: Not on file  . Sexually Abused: Not on file  Vitals:   04/16/19 1335  BP: (!) 154/74  Pulse: 83  SpO2: 92%  Weight: 202 lb (91.6 kg)  Height: 5\' 11"  (1.803 m)    Wt Readings from Last 3 Encounters:  04/16/19 202 lb (91.6 kg)  02/21/19 202 lb (91.6 kg)  02/16/19 198 lb 13.7 oz (90.2 kg)     PHYSICAL EXAM General: NAD HEENT: Normal. Neck: No JVD, no thyromegaly. Lungs: Clear to auscultation bilaterally with normal respiratory effort. CV: Regular rate and rhythm, normal S1/S2, no S3/S4, no murmur. No pretibial or periankle edema.   Abdomen: Soft, nontender, no distention.  Neurologic: Alert and oriented.  Psych: Normal affect. Skin: Normal. Musculoskeletal: No gross deformities.      Labs: Lab Results  Component Value Date/Time   K 4.4 02/21/2019 03:34 PM   BUN 14 02/21/2019 03:34 PM   CREATININE 1.20 02/21/2019 03:34 PM   HGB 11.8 (L) 02/16/2019 04:18 AM     Lipids: No results found for: LDLCALC, LDLDIRECT, CHOL, TRIG, HDL    Echocardiogram: 05/2018 IMPRESSIONS  1. The left ventricle has moderately reduced systolic function, with an ejection fraction of 35-40%. The cavity size was normal. There is mildly increased left ventricular wall thickness. Left ventricular diastolic Doppler parameters are consistent with impaired relaxation Indeterminent filling pressures Left ventricular diffuse hypokinesis. 2. Left atrial size was mildly dilated. 3. The mitral valve is normal in structure. There is mild mitral annular calcification present. 4. The tricuspid valve is normal in structure. 5. The aortic valve is tricuspid There is mild thickening and sclerosis without any evidence of stenosis of the aortic valve. Aortic valve regurgitation is trivial by color  flow Doppler. 6. There is mild dilatation of the aortic root. 7. Right atrial pressure is estimated at 10 mmHg. 8. The interatrial septum was not well visualized.   Cardiac Catheterization: 02/15/2019  Prox RCA lesion is 75% stenosed.  RPDA lesion is 99% stenosed. SVG to PDA is patent with stenosis and was treated.  Ost LAD to Prox LAD lesion is 95% stenosed.  2nd Mrg lesion is 100% stenosed.  Mid LAD lesion is 100% stenosed. Jump graft LIMA to diag/LAD is widely patent.  SVG to PDA Origin to Prox Graft lesion before RPDA is 80% stenosed.  A drug-eluting stent was successfully placed using a STENT SYNERGY DES 3.5X20.  Post intervention, there is a 5% residual stenosis.  Jump graft between RPDA and RPAV is 100% stenosed.  LV end diastolic pressure is moderately elevated.  There is no aortic valve stenosis.  Severe three vessel disease. Patent grafts to OM, LAD and diagonal. PCI to SVG to PDA.   Continue DAPT with aggressive secondary prevention. Watch overnight on telemetry. Likely discharge tomorrow.  Minimal fluids post cath due to LV dysfunction and moderately elevated LVEDP.   Hold ACE-I until creatinine rechecked.    ASSESSMENT AND PLAN: 1.  Coronary artery disease: Status post CABG in 1994 with patent grafts by cath in 2003. Catheterization in October 2020 showed patent grafts to OM, LAD and diagonal but was noted to have 80% stenosis along SVG-PDA which was treated with PCI/DES placement.  Continue dual antiplatelet therapy with aspirin and clopidogrel.  Continue beta-blocker, Ranexa, and statin.   2.  Chronic combined heart failure: LVEF 35 to 40% by echocardiogram in February 2020.  Continue Lasix 40 mg daily.  Continue Toprol-XL.  I will resume benazepril daily.  As he has shortness of breath, I will obtain a chest x-ray and check BMP, CBC, and  basic metabolic panel.  3.  Hypertension: Blood pressure is elevated.  I will resume benazepril 10 mg  daily.  4.  Hyperlipidemia: Continue atorvastatin 20 mg daily.  5.  Chronic kidney disease stage III: Creatinine 1.2 on 02/13/2019.  6.  Shortness of breath: I will obtain a chest x-ray and check BNP, CBC, and basic metabolic panel.    Disposition: Follow up 6 weeks virtual visit   Kate Sable, M.D., F.A.C.C.

## 2019-04-26 DIAGNOSIS — I2510 Atherosclerotic heart disease of native coronary artery without angina pectoris: Secondary | ICD-10-CM | POA: Diagnosis not present

## 2019-04-26 DIAGNOSIS — E119 Type 2 diabetes mellitus without complications: Secondary | ICD-10-CM | POA: Diagnosis not present

## 2019-04-26 DIAGNOSIS — I10 Essential (primary) hypertension: Secondary | ICD-10-CM | POA: Diagnosis not present

## 2019-04-26 DIAGNOSIS — E7800 Pure hypercholesterolemia, unspecified: Secondary | ICD-10-CM | POA: Diagnosis not present

## 2019-06-07 ENCOUNTER — Telehealth (INDEPENDENT_AMBULATORY_CARE_PROVIDER_SITE_OTHER): Payer: Medicare Other | Admitting: Cardiovascular Disease

## 2019-06-07 ENCOUNTER — Encounter: Payer: Self-pay | Admitting: Cardiovascular Disease

## 2019-06-07 VITALS — BP 93/52 | HR 75 | Ht 71.0 in | Wt 204.0 lb

## 2019-06-07 DIAGNOSIS — I5042 Chronic combined systolic (congestive) and diastolic (congestive) heart failure: Secondary | ICD-10-CM

## 2019-06-07 DIAGNOSIS — R0600 Dyspnea, unspecified: Secondary | ICD-10-CM | POA: Diagnosis not present

## 2019-06-07 DIAGNOSIS — Z23 Encounter for immunization: Secondary | ICD-10-CM | POA: Diagnosis not present

## 2019-06-07 DIAGNOSIS — I10 Essential (primary) hypertension: Secondary | ICD-10-CM

## 2019-06-07 DIAGNOSIS — E785 Hyperlipidemia, unspecified: Secondary | ICD-10-CM

## 2019-06-07 DIAGNOSIS — N183 Chronic kidney disease, stage 3 unspecified: Secondary | ICD-10-CM

## 2019-06-07 DIAGNOSIS — R0609 Other forms of dyspnea: Secondary | ICD-10-CM

## 2019-06-07 DIAGNOSIS — I25708 Atherosclerosis of coronary artery bypass graft(s), unspecified, with other forms of angina pectoris: Secondary | ICD-10-CM

## 2019-06-07 NOTE — Progress Notes (Addendum)
Virtual Visit via Telephone Note   This visit type was conducted due to national recommendations for restrictions regarding the COVID-19 Pandemic (e.g. social distancing) in an effort to limit this patient's exposure and mitigate transmission in our community.  Due to his co-morbid illnesses, this patient is at least at moderate risk for complications without adequate follow up.  This format is felt to be most appropriate for this patient at this time.  The patient did not have access to video technology/had technical difficulties with video requiring transitioning to audio format only (telephone).  All issues noted in this document were discussed and addressed.  No physical exam could be performed with this format.  Please refer to the patient's chart for his  consent to telehealth for [facility].   Date:  06/07/2019   ID:  [name], DOB 1927/04/07, MRN [id number]  Patient Location: Home Provider Location: Office  PCP:  Glenda Chroman, MD  Cardiologist:  Kate Sable, MD  Electrophysiologist:  None   Evaluation Performed:  Follow-Up Visit  Chief Complaint:  CAD  History of Present Illness:    [name] is a 86 y.o. male with past medical history of CAD (s/p CABG in Aug 03, 1992 with patent grafts by cath in [id number] combined systolic and diastolic CHF (EF 35 to [id number] by echo in 05/2018), baseline bradycardia, carotid artery stenosis,HTN, HLD, Type 2 DM, Stage 3 CKD and prior TIA.  He underwent cardiac catheterization on 02/15/2019 which showed severe 3-vessel CAD with patent grafts to OM, LAD and diagonal butwasnoted to have 80% stenosis along SVG-PDA which was treated with PCI/DES placement.It was recommended to be on DAPT with ASA and Plavix for a minimum of 6 months.  He was short of breath at his last visit with me on 04/16/2019.  X-ray showed no evidence of acute cardiopulmonary disease.  BNP was mildly elevated at 184.  White blood cells were mildly elevated  at 11.1 with a normal hemoglobin of 13.6.  Creatinine was 1.25.  He denies chest pain. He feels weak and tired. He denies leg swelling. He continues to have shortness of breath but breathes better when the air is cooler.  He denies fevers and chills. He got the first dose of COVID vaccine today.   Social history: His wife of 33 years passed away in Aug 04, 2014. His daughter, [name], lives with him now. He has a son as well. His daughter used to work at [employer] in [place].  Past Medical History:  Diagnosis Date  . Aortic insufficiency    a. previously mild in 08-04-2010. b. trivial seen on echo in 05/2018.  . Asbestos exposure   . Bradycardia    MILD  . CAD (coronary artery disease) 08/03/01   a. s/p CABG in 03-Aug-1992. b. Cath 03-Aug-2001 patent grafts. c. Alvarez in 08-04-18 felt anginal equivalent - cath 01/2019 s/p PCI/DES to the SVG-PDA.  . Cancer ([place])    Skin  . Carotid artery disease ([place]) 08-04-99   BILATERAL 0-39%/  Follow with Dopplers by Dr. Woody Seller  . Chronic combined systolic and diastolic CHF (congestive heart failure) ([place])   . CKD (chronic kidney disease), stage III   . Colitis   . Diabetes mellitus   . Dilated aortic root ([place]) 05/2018  . Dizziness    He has had falls related to this / Chronic dizziness  . Dyslipidemia   . Hx of CABG   . Hypertension   . Nephrolithiasis   . PVD (peripheral  vascular disease) ([place])   . Renal cyst   . TIA (transient ischemic attack) 2007   ?? medical Rx    Past Surgical History:  Procedure Laterality Date  . CORONARY ARTERY BYPASS GRAFT    . CORONARY STENT INTERVENTION N/A 02/15/2019   Procedure: CORONARY STENT INTERVENTION;  Surgeon: Jettie Booze, MD;  Location: [facility];  Service: Cardiovascular;  Laterality: N/A;  SEQ SVG PDA-PL  . LEFT HEART CATH AND CORS/GRAFTS ANGIOGRAPHY N/A 02/15/2019   Procedure: LEFT HEART CATH AND CORS/GRAFTS ANGIOGRAPHY;  Surgeon: Jettie Booze, MD;  Location: [facility];  Service:  Cardiovascular;  Laterality: N/A;  . percutaneous transluminal coronary angioplasty     hx     Current Meds  Medication Sig  . aspirin EC 81 MG tablet Take 1 tablet (81 mg total) by mouth daily.  Marland Kitchen atorvastatin (LIPITOR) 20 MG tablet Take 20 mg by mouth daily.  . benazepril (LOTENSIN) 10 MG tablet Take 1 tablet (10 mg total) by mouth daily.  . cetirizine (ZYRTEC) 10 MG tablet Take 1 tablet by mouth daily as needed for allergies.   . citalopram (CELEXA) 40 MG tablet Take 0.5 tablets (20 mg total) by mouth daily. Please cut your existing tablets with a pill cutter. If you have any difficulty, let your primary care doctor know.  . clopidogrel (PLAVIX) 75 MG tablet Take 1 tablet (75 mg total) by mouth daily.  . diazepam (VALIUM) 5 MG tablet Take 5 mg by mouth at bedtime as needed. For nerves   . furosemide (LASIX) 40 MG tablet Take 1 tablet (40 mg total) by mouth daily.  . metFORMIN (GLUCOPHAGE) 500 MG tablet Take 500 mg by mouth at bedtime.   . metoprolol succinate (TOPROL-XL) 25 MG 24 hr tablet TAKE 1 TABLET BY MOUTH EVERY DAY  . Multiple Vitamin (MULTIVITAMIN) tablet Take 1 tablet by mouth daily.    . nitroGLYCERIN (NITROSTAT) 0.4 MG SL tablet Place 1 tablet (0.4 mg total) under the tongue every 5 (five) minutes as needed for chest pain.  . ranolazine (RANEXA) 1000 MG SR tablet TAKE 1 TABLET BY MOUTH TWICE DAILY  . rOPINIRole (REQUIP) 1 MG tablet Take 1 mg by mouth 2 (two) times daily. Take 1 tab in AM and 2 PM     Allergies:   Sulfonamide derivatives and Tetracycline   Social History   Tobacco Use  . Smoking status: Former Smoker    Packs/day: 2.00    Years: 30.00    Pack years: 60.00    Types: Cigarettes    Start date: 12/23/1946    Quit date: 04/26/1980    Years since quitting: 39.1  . Smokeless tobacco: Never Used  Substance Use Topics  . Alcohol use: Not on file  . Drug use: Not on file     Family Hx: The patient's family history includes Heart attack (age of onset: 37) in  his mother.  ROS:   Please see the history of present illness.     All other systems reviewed and are negative.   Prior CV studies:   The following studies were reviewed today:  Echocardiogram: 05/2018 IMPRESSIONS  1. The left ventricle has moderately reduced systolic function, with an ejection fraction of 35-40%. The cavity size was normal. There is mildly increased left ventricular wall thickness. Left ventricular diastolic Doppler parameters are consistent with impaired relaxation Indeterminent filling pressures Left ventricular diffuse hypokinesis. 2. Left atrial size was mildly dilated. 3. The mitral valve is  normal in structure. There is mild mitral annular calcification present. 4. The tricuspid valve is normal in structure. 5. The aortic valve is tricuspid There is mild thickening and sclerosis without any evidence of stenosis of the aortic valve. Aortic valve regurgitation is trivial by color flow Doppler. 6. There is mild dilatation of the aortic root. 7. Right atrial pressure is estimated at 10 mmHg. 8. The interatrial septum was not well visualized.   Cardiac Catheterization: 02/15/2019  Prox RCA lesion is 75% stenosed.  RPDA lesion is 99% stenosed. SVG to PDA is patent with stenosis and was treated.  Ost LAD to Prox LAD lesion is 95% stenosed.  2nd Mrg lesion is 100% stenosed.  Mid LAD lesion is 100% stenosed. Jump graft LIMA to diag/LAD is widely patent.  SVG to PDA Origin to Prox Graft lesion before RPDA is 80% stenosed.  A drug-eluting stent was successfully placed using a STENT SYNERGY DES 3.5X20.  Post intervention, there is a 5% residual stenosis.  Jump graft between RPDA and RPAV is 100% stenosed.  LV end diastolic pressure is moderately elevated.  There is no aortic valve stenosis.  Severe three vessel disease. Patent grafts to OM, LAD and diagonal. PCI to SVG to PDA.   Continue DAPT with aggressive secondary prevention. Watch  overnight on telemetry. Likely discharge tomorrow.  Minimal fluids post cath due to LV dysfunction and moderately elevated LVEDP.   Hold ACE-I until creatinine rechecked.   Labs/Other Tests and Data Reviewed:    EKG:  No ECG reviewed.  Recent Labs: 04/16/2019: B Natriuretic Peptide 184.0; BUN 20; Creatinine, Ser 1.25; Hemoglobin 13.6; Platelets 260; Potassium 4.0; Sodium 138   Recent Lipid Panel No results found for: CHOL, TRIG, HDL, CHOLHDL, LDLCALC, LDLDIRECT  Wt Readings from Last 3 Encounters:  06/07/19 204 lb (92.5 kg)  04/16/19 202 lb (91.6 kg)  02/21/19 202 lb (91.6 kg)     Objective:    Vital Signs:  BP (!) 93/52   Pulse 75   Ht 5\' 11"  (1.803 m)   Wt 204 lb (92.5 kg)   SpO2 91%   BMI 28.45 kg/m    VITAL SIGNS:  reviewed  ASSESSMENT & PLAN:    1.  Coronary artery disease: Status post CABG in 1994 with patent grafts by cath in 2003. Catheterization in October 2020 showedpatent grafts to OM, LAD and diagonal butwasnoted to have 80% stenosis along SVG-PDA which was treated with PCI/DES placement.  Continue dual antiplatelet therapy with aspirin and clopidogrel.  Continue beta-blocker and statin.  His daughter asked about potentially stopping Ranexa to see how he does.  I think that is reasonable.  2.  Chronic combined heart failure: LVEF 35 to 40% by echocardiogram in February 2020.    As he is short of breath, I will increase Lasix to 40 mg twice daily and start supplemental potassium 20 mEq daily to see if this helps.  Continue Toprol-XL.  I will discontinue benazepril given low blood pressure.   I will check a basic metabolic panel in 1 week.  3.  Hypertension: Blood pressure is low normal. I will stop benazepril.  4.  Hyperlipidemia: Continue atorvastatin 20 mg daily.  5.  Chronic kidney disease stage III: Creatinine 1.25.  As I am increasing the diuretic dose, I will check a basic metabolic panel in 1 week.   COVID-19 Education: The signs and  symptoms of COVID-19 were discussed with the patient and how to seek care for testing (follow up with PCP  or arrange E-visit).  The importance of social distancing was discussed today.  Time:   Today, I have spent 25 minutes with the patient with telehealth technology discussing the above problems.     Medication Adjustments/Labs and Tests Ordered: Current medicines are reviewed at length with the patient today.  Concerns regarding medicines are outlined above.   Tests Ordered: No orders of the defined types were placed in this encounter.   Medication Changes: No orders of the defined types were placed in this encounter.   Follow Up:  Virtual Visit  in 3 months Signed, Kate Sable, MD  06/07/2019 2:14 PM    [place]

## 2019-06-11 MED ORDER — POTASSIUM CHLORIDE CRYS ER 20 MEQ PO TBCR: 20.0000 meq | EXTENDED_RELEASE_TABLET | Freq: Every day | ORAL | 6 refills | Status: DC

## 2019-06-11 MED ORDER — FUROSEMIDE 40 MG PO TABS: 40.0000 mg | ORAL_TABLET | Freq: Two times a day (BID) | ORAL | 6 refills | Status: DC

## 2019-06-11 NOTE — Addendum Note (Signed)
Addended by: Laurine Blazer on: 06/11/2019 04:37 PM   Modules accepted: Orders

## 2019-06-11 NOTE — Patient Instructions (Signed)
Medication Instructions:   Stop Benazepril.  Stop Ranexa.   Increase Lasix to 40mg  twice a day.  Begin Potassium 56meq daily.  New prescriptions sent to [facility]   Continue all other medications.    Labwork:  BMET - order enclosed.   Please do at [facility] or at [facility] across from the hospital.   Office will contact with results via phone or letter.    Testing/Procedures: None   Follow-Up: 3 months   Any Other Special Instructions Will Be Listed Below (If Applicable).  If you need a refill on your cardiac medications before your next appointment, please call your pharmacy.

## 2019-06-20 ENCOUNTER — Other Ambulatory Visit (HOSPITAL_COMMUNITY)
Admission: RE | Admit: 2019-06-20 | Discharge: 2019-06-20 | Disposition: A | Discharge status: Home / Self Care | Payer: Medicare Other | Source: Ambulatory Visit | Attending: Cardiovascular Disease | Admitting: Cardiovascular Disease

## 2019-06-20 ENCOUNTER — Telehealth: Payer: Self-pay | Admitting: *Deleted

## 2019-06-20 ENCOUNTER — Other Ambulatory Visit: Payer: Self-pay

## 2019-06-20 DIAGNOSIS — R0600 Dyspnea, unspecified: Secondary | ICD-10-CM | POA: Insufficient documentation

## 2019-06-20 DIAGNOSIS — I5042 Chronic combined systolic (congestive) and diastolic (congestive) heart failure: Secondary | ICD-10-CM | POA: Insufficient documentation

## 2019-06-20 DIAGNOSIS — I10 Essential (primary) hypertension: Secondary | ICD-10-CM | POA: Insufficient documentation

## 2019-06-20 LAB — BASIC METABOLIC PANEL
Anion gap: 11 (ref 5–15)
BUN: 21 mg/dL (ref 8–23)
CO2: 27 mmol/L (ref 22–32)
Calcium: 9.3 mg/dL (ref 8.9–10.3)
Chloride: 100 mmol/L (ref 98–111)
Creatinine, Ser: 1.33 mg/dL — ABNORMAL HIGH (ref 0.61–1.24)
GFR calc Af Amer: 54 mL/min — ABNORMAL LOW (ref >60)
GFR calc non Af Amer: 47 mL/min — ABNORMAL LOW (ref >60)
Glucose, Bld: 256 mg/dL — ABNORMAL HIGH (ref 70–99)
Potassium: 4.2 mmol/L (ref 3.5–5.1)
Sodium: 138 mmol/L (ref 135–145)

## 2019-06-20 NOTE — Telephone Encounter (Signed)
-----   Message from Herminio Commons, MD sent at 06/20/2019  1:35 PM EST ----- Blood sugar is high.  Creatinine is mildly elevated but renal dysfunction is overall stable.  Please forward a copy to PCP.

## 2019-06-20 NOTE — Telephone Encounter (Signed)
[name], [place]  [id number] PM EST    Daughter [name]) notified. Copy to pcp. Follow up scheduled for 09/14/2019 with Dr. Bronson Ing.    Patient was not fasting for this lab, hence the elevated glucose.

## 2019-06-27 DIAGNOSIS — L280 Lichen simplex chronicus: Secondary | ICD-10-CM | POA: Diagnosis not present

## 2019-06-27 DIAGNOSIS — M889 Osteitis deformans of unspecified bone: Secondary | ICD-10-CM | POA: Diagnosis not present

## 2019-06-27 DIAGNOSIS — L111 Transient acantholytic dermatosis [Grover]: Secondary | ICD-10-CM | POA: Diagnosis not present

## 2019-06-27 DIAGNOSIS — L570 Actinic keratosis: Secondary | ICD-10-CM | POA: Diagnosis not present

## 2019-06-29 DIAGNOSIS — E119 Type 2 diabetes mellitus without complications: Secondary | ICD-10-CM | POA: Diagnosis not present

## 2019-06-29 DIAGNOSIS — I2510 Atherosclerotic heart disease of native coronary artery without angina pectoris: Secondary | ICD-10-CM | POA: Diagnosis not present

## 2019-06-29 DIAGNOSIS — E7800 Pure hypercholesterolemia, unspecified: Secondary | ICD-10-CM | POA: Diagnosis not present

## 2019-06-29 DIAGNOSIS — I10 Essential (primary) hypertension: Secondary | ICD-10-CM | POA: Diagnosis not present

## 2019-07-06 DIAGNOSIS — Z23 Encounter for immunization: Secondary | ICD-10-CM | POA: Diagnosis not present

## 2019-07-09 DIAGNOSIS — E119 Type 2 diabetes mellitus without complications: Secondary | ICD-10-CM | POA: Diagnosis not present

## 2019-07-09 DIAGNOSIS — R41841 Cognitive communication deficit: Secondary | ICD-10-CM | POA: Diagnosis not present

## 2019-07-09 DIAGNOSIS — F419 Anxiety disorder, unspecified: Secondary | ICD-10-CM | POA: Diagnosis not present

## 2019-07-09 DIAGNOSIS — R410 Disorientation, unspecified: Secondary | ICD-10-CM | POA: Diagnosis not present

## 2019-07-09 DIAGNOSIS — S8001XA Contusion of right knee, initial encounter: Secondary | ICD-10-CM | POA: Diagnosis present

## 2019-07-09 DIAGNOSIS — Z7982 Long term (current) use of aspirin: Secondary | ICD-10-CM | POA: Diagnosis not present

## 2019-07-09 DIAGNOSIS — S2241XS Multiple fractures of ribs, right side, sequela: Secondary | ICD-10-CM | POA: Diagnosis not present

## 2019-07-09 DIAGNOSIS — R456 Violent behavior: Secondary | ICD-10-CM | POA: Diagnosis not present

## 2019-07-09 DIAGNOSIS — R52 Pain, unspecified: Secondary | ICD-10-CM | POA: Diagnosis not present

## 2019-07-09 DIAGNOSIS — R471 Dysarthria and anarthria: Secondary | ICD-10-CM | POA: Diagnosis not present

## 2019-07-09 DIAGNOSIS — S8991XA Unspecified injury of right lower leg, initial encounter: Secondary | ICD-10-CM | POA: Diagnosis not present

## 2019-07-09 DIAGNOSIS — I130 Hypertensive heart and chronic kidney disease with heart failure and stage 1 through stage 4 chronic kidney disease, or unspecified chronic kidney disease: Secondary | ICD-10-CM | POA: Diagnosis present

## 2019-07-09 DIAGNOSIS — E7800 Pure hypercholesterolemia, unspecified: Secondary | ICD-10-CM | POA: Diagnosis present

## 2019-07-09 DIAGNOSIS — I10 Essential (primary) hypertension: Secondary | ICD-10-CM | POA: Diagnosis not present

## 2019-07-09 DIAGNOSIS — J189 Pneumonia, unspecified organism: Secondary | ICD-10-CM | POA: Diagnosis present

## 2019-07-09 DIAGNOSIS — S2241XA Multiple fractures of ribs, right side, initial encounter for closed fracture: Secondary | ICD-10-CM | POA: Diagnosis present

## 2019-07-09 DIAGNOSIS — R4182 Altered mental status, unspecified: Secondary | ICD-10-CM | POA: Diagnosis not present

## 2019-07-09 DIAGNOSIS — M5489 Other dorsalgia: Secondary | ICD-10-CM | POA: Diagnosis not present

## 2019-07-09 DIAGNOSIS — G2581 Restless legs syndrome: Secondary | ICD-10-CM | POA: Diagnosis present

## 2019-07-09 DIAGNOSIS — S199XXA Unspecified injury of neck, initial encounter: Secondary | ICD-10-CM | POA: Diagnosis not present

## 2019-07-09 DIAGNOSIS — J188 Other pneumonia, unspecified organism: Secondary | ICD-10-CM | POA: Diagnosis not present

## 2019-07-09 DIAGNOSIS — M6281 Muscle weakness (generalized): Secondary | ICD-10-CM | POA: Diagnosis not present

## 2019-07-09 DIAGNOSIS — S79911A Unspecified injury of right hip, initial encounter: Secondary | ICD-10-CM | POA: Diagnosis not present

## 2019-07-09 DIAGNOSIS — Z8673 Personal history of transient ischemic attack (TIA), and cerebral infarction without residual deficits: Secondary | ICD-10-CM | POA: Diagnosis not present

## 2019-07-09 DIAGNOSIS — Z7401 Bed confinement status: Secondary | ICD-10-CM | POA: Diagnosis not present

## 2019-07-09 DIAGNOSIS — F0391 Unspecified dementia with behavioral disturbance: Secondary | ICD-10-CM | POA: Diagnosis not present

## 2019-07-09 DIAGNOSIS — M25562 Pain in left knee: Secondary | ICD-10-CM | POA: Diagnosis not present

## 2019-07-09 DIAGNOSIS — N400 Enlarged prostate without lower urinary tract symptoms: Secondary | ICD-10-CM | POA: Diagnosis not present

## 2019-07-09 DIAGNOSIS — R451 Restlessness and agitation: Secondary | ICD-10-CM | POA: Diagnosis not present

## 2019-07-09 DIAGNOSIS — R1311 Dysphagia, oral phase: Secondary | ICD-10-CM | POA: Diagnosis not present

## 2019-07-09 DIAGNOSIS — W19XXXA Unspecified fall, initial encounter: Secondary | ICD-10-CM | POA: Diagnosis not present

## 2019-07-09 DIAGNOSIS — I2510 Atherosclerotic heart disease of native coronary artery without angina pectoris: Secondary | ICD-10-CM | POA: Diagnosis present

## 2019-07-09 DIAGNOSIS — Z951 Presence of aortocoronary bypass graft: Secondary | ICD-10-CM | POA: Diagnosis not present

## 2019-07-09 DIAGNOSIS — I959 Hypotension, unspecified: Secondary | ICD-10-CM | POA: Diagnosis not present

## 2019-07-09 DIAGNOSIS — Z8679 Personal history of other diseases of the circulatory system: Secondary | ICD-10-CM | POA: Diagnosis not present

## 2019-07-09 DIAGNOSIS — J690 Pneumonitis due to inhalation of food and vomit: Secondary | ICD-10-CM | POA: Diagnosis not present

## 2019-07-09 DIAGNOSIS — I252 Old myocardial infarction: Secondary | ICD-10-CM | POA: Diagnosis not present

## 2019-07-09 DIAGNOSIS — Z741 Need for assistance with personal care: Secondary | ICD-10-CM | POA: Diagnosis not present

## 2019-07-09 DIAGNOSIS — I4891 Unspecified atrial fibrillation: Secondary | ICD-10-CM | POA: Diagnosis not present

## 2019-07-09 DIAGNOSIS — N189 Chronic kidney disease, unspecified: Secondary | ICD-10-CM | POA: Diagnosis present

## 2019-07-09 DIAGNOSIS — Z7902 Long term (current) use of antithrombotics/antiplatelets: Secondary | ICD-10-CM | POA: Diagnosis not present

## 2019-07-09 DIAGNOSIS — S8992XA Unspecified injury of left lower leg, initial encounter: Secondary | ICD-10-CM | POA: Diagnosis not present

## 2019-07-09 DIAGNOSIS — S0990XA Unspecified injury of head, initial encounter: Secondary | ICD-10-CM | POA: Diagnosis not present

## 2019-07-09 DIAGNOSIS — F0390 Unspecified dementia without behavioral disturbance: Secondary | ICD-10-CM | POA: Diagnosis present

## 2019-07-09 DIAGNOSIS — Z7984 Long term (current) use of oral hypoglycemic drugs: Secondary | ICD-10-CM | POA: Diagnosis not present

## 2019-07-09 DIAGNOSIS — S3991XA Unspecified injury of abdomen, initial encounter: Secondary | ICD-10-CM | POA: Diagnosis not present

## 2019-07-09 DIAGNOSIS — F329 Major depressive disorder, single episode, unspecified: Secondary | ICD-10-CM | POA: Diagnosis not present

## 2019-07-09 DIAGNOSIS — R0902 Hypoxemia: Secondary | ICD-10-CM | POA: Diagnosis not present

## 2019-07-09 DIAGNOSIS — E1122 Type 2 diabetes mellitus with diabetic chronic kidney disease: Secondary | ICD-10-CM | POA: Diagnosis present

## 2019-07-09 DIAGNOSIS — Z87891 Personal history of nicotine dependence: Secondary | ICD-10-CM | POA: Diagnosis not present

## 2019-07-09 DIAGNOSIS — I5042 Chronic combined systolic (congestive) and diastolic (congestive) heart failure: Secondary | ICD-10-CM | POA: Diagnosis not present

## 2019-07-09 DIAGNOSIS — R262 Difficulty in walking, not elsewhere classified: Secondary | ICD-10-CM | POA: Diagnosis not present

## 2019-07-09 DIAGNOSIS — S8002XA Contusion of left knee, initial encounter: Secondary | ICD-10-CM | POA: Diagnosis present

## 2019-07-09 DIAGNOSIS — M25561 Pain in right knee: Secondary | ICD-10-CM | POA: Diagnosis not present

## 2019-07-09 DIAGNOSIS — Z86718 Personal history of other venous thrombosis and embolism: Secondary | ICD-10-CM | POA: Diagnosis not present

## 2019-07-09 DIAGNOSIS — D649 Anemia, unspecified: Secondary | ICD-10-CM | POA: Diagnosis not present

## 2019-07-09 DIAGNOSIS — Z20822 Contact with and (suspected) exposure to covid-19: Secondary | ICD-10-CM | POA: Diagnosis not present

## 2019-07-09 DIAGNOSIS — R296 Repeated falls: Secondary | ICD-10-CM | POA: Diagnosis present

## 2019-07-09 DIAGNOSIS — E7849 Other hyperlipidemia: Secondary | ICD-10-CM | POA: Diagnosis not present

## 2019-07-09 DIAGNOSIS — S2243XA Multiple fractures of ribs, bilateral, initial encounter for closed fracture: Secondary | ICD-10-CM | POA: Diagnosis not present

## 2019-07-09 DIAGNOSIS — M179 Osteoarthritis of knee, unspecified: Secondary | ICD-10-CM | POA: Diagnosis not present

## 2019-07-16 DIAGNOSIS — R451 Restlessness and agitation: Secondary | ICD-10-CM | POA: Diagnosis not present

## 2019-07-16 DIAGNOSIS — E785 Hyperlipidemia, unspecified: Secondary | ICD-10-CM | POA: Diagnosis not present

## 2019-07-16 DIAGNOSIS — Z7401 Bed confinement status: Secondary | ICD-10-CM | POA: Diagnosis not present

## 2019-07-16 DIAGNOSIS — F329 Major depressive disorder, single episode, unspecified: Secondary | ICD-10-CM | POA: Diagnosis not present

## 2019-07-16 DIAGNOSIS — I5042 Chronic combined systolic (congestive) and diastolic (congestive) heart failure: Secondary | ICD-10-CM | POA: Diagnosis not present

## 2019-07-16 DIAGNOSIS — Z8679 Personal history of other diseases of the circulatory system: Secondary | ICD-10-CM | POA: Diagnosis not present

## 2019-07-16 DIAGNOSIS — Z741 Need for assistance with personal care: Secondary | ICD-10-CM | POA: Diagnosis not present

## 2019-07-16 DIAGNOSIS — R456 Violent behavior: Secondary | ICD-10-CM | POA: Diagnosis not present

## 2019-07-16 DIAGNOSIS — I10 Essential (primary) hypertension: Secondary | ICD-10-CM | POA: Diagnosis not present

## 2019-07-16 DIAGNOSIS — I130 Hypertensive heart and chronic kidney disease with heart failure and stage 1 through stage 4 chronic kidney disease, or unspecified chronic kidney disease: Secondary | ICD-10-CM | POA: Diagnosis not present

## 2019-07-16 DIAGNOSIS — S2249XA Multiple fractures of ribs, unspecified side, initial encounter for closed fracture: Secondary | ICD-10-CM | POA: Diagnosis not present

## 2019-07-16 DIAGNOSIS — I959 Hypotension, unspecified: Secondary | ICD-10-CM | POA: Diagnosis not present

## 2019-07-16 DIAGNOSIS — R41841 Cognitive communication deficit: Secondary | ICD-10-CM | POA: Diagnosis not present

## 2019-07-16 DIAGNOSIS — R471 Dysarthria and anarthria: Secondary | ICD-10-CM | POA: Diagnosis not present

## 2019-07-16 DIAGNOSIS — S2241XA Multiple fractures of ribs, right side, initial encounter for closed fracture: Secondary | ICD-10-CM | POA: Diagnosis not present

## 2019-07-16 DIAGNOSIS — M179 Osteoarthritis of knee, unspecified: Secondary | ICD-10-CM | POA: Diagnosis not present

## 2019-07-16 DIAGNOSIS — R262 Difficulty in walking, not elsewhere classified: Secondary | ICD-10-CM | POA: Diagnosis not present

## 2019-07-16 DIAGNOSIS — M6281 Muscle weakness (generalized): Secondary | ICD-10-CM | POA: Diagnosis not present

## 2019-07-16 DIAGNOSIS — R296 Repeated falls: Secondary | ICD-10-CM | POA: Diagnosis not present

## 2019-07-16 DIAGNOSIS — I4891 Unspecified atrial fibrillation: Secondary | ICD-10-CM | POA: Diagnosis not present

## 2019-07-16 DIAGNOSIS — F0391 Unspecified dementia with behavioral disturbance: Secondary | ICD-10-CM | POA: Diagnosis not present

## 2019-07-16 DIAGNOSIS — I2510 Atherosclerotic heart disease of native coronary artery without angina pectoris: Secondary | ICD-10-CM | POA: Diagnosis not present

## 2019-07-16 DIAGNOSIS — E7849 Other hyperlipidemia: Secondary | ICD-10-CM | POA: Diagnosis not present

## 2019-07-16 DIAGNOSIS — J188 Other pneumonia, unspecified organism: Secondary | ICD-10-CM | POA: Diagnosis not present

## 2019-07-16 DIAGNOSIS — G2581 Restless legs syndrome: Secondary | ICD-10-CM | POA: Diagnosis not present

## 2019-07-16 DIAGNOSIS — N189 Chronic kidney disease, unspecified: Secondary | ICD-10-CM | POA: Diagnosis not present

## 2019-07-16 DIAGNOSIS — J189 Pneumonia, unspecified organism: Secondary | ICD-10-CM | POA: Diagnosis not present

## 2019-07-16 DIAGNOSIS — R4182 Altered mental status, unspecified: Secondary | ICD-10-CM | POA: Diagnosis not present

## 2019-07-16 DIAGNOSIS — S2241XS Multiple fractures of ribs, right side, sequela: Secondary | ICD-10-CM | POA: Diagnosis not present

## 2019-07-16 DIAGNOSIS — R1311 Dysphagia, oral phase: Secondary | ICD-10-CM | POA: Diagnosis not present

## 2019-07-16 DIAGNOSIS — E119 Type 2 diabetes mellitus without complications: Secondary | ICD-10-CM | POA: Diagnosis not present

## 2019-07-16 DIAGNOSIS — R410 Disorientation, unspecified: Secondary | ICD-10-CM | POA: Diagnosis not present

## 2019-07-16 DIAGNOSIS — F419 Anxiety disorder, unspecified: Secondary | ICD-10-CM | POA: Diagnosis not present

## 2019-07-16 DIAGNOSIS — D649 Anemia, unspecified: Secondary | ICD-10-CM | POA: Diagnosis not present

## 2019-07-16 DIAGNOSIS — R1310 Dysphagia, unspecified: Secondary | ICD-10-CM | POA: Diagnosis not present

## 2019-07-16 DIAGNOSIS — N400 Enlarged prostate without lower urinary tract symptoms: Secondary | ICD-10-CM | POA: Diagnosis not present

## 2019-07-17 DIAGNOSIS — F0391 Unspecified dementia with behavioral disturbance: Secondary | ICD-10-CM | POA: Diagnosis not present

## 2019-07-17 DIAGNOSIS — R296 Repeated falls: Secondary | ICD-10-CM | POA: Diagnosis not present

## 2019-07-17 DIAGNOSIS — R451 Restlessness and agitation: Secondary | ICD-10-CM | POA: Diagnosis not present

## 2019-07-17 DIAGNOSIS — S2249XA Multiple fractures of ribs, unspecified side, initial encounter for closed fracture: Secondary | ICD-10-CM | POA: Diagnosis not present

## 2019-07-27 DIAGNOSIS — S2249XA Multiple fractures of ribs, unspecified side, initial encounter for closed fracture: Secondary | ICD-10-CM | POA: Diagnosis not present

## 2019-07-27 DIAGNOSIS — I10 Essential (primary) hypertension: Secondary | ICD-10-CM | POA: Diagnosis not present

## 2019-07-27 DIAGNOSIS — R296 Repeated falls: Secondary | ICD-10-CM | POA: Diagnosis not present

## 2019-07-27 DIAGNOSIS — F0391 Unspecified dementia with behavioral disturbance: Secondary | ICD-10-CM | POA: Diagnosis not present

## 2019-08-05 DIAGNOSIS — I10 Essential (primary) hypertension: Secondary | ICD-10-CM | POA: Diagnosis not present

## 2019-08-05 DIAGNOSIS — E785 Hyperlipidemia, unspecified: Secondary | ICD-10-CM | POA: Diagnosis not present

## 2019-08-09 DIAGNOSIS — R296 Repeated falls: Secondary | ICD-10-CM | POA: Diagnosis not present

## 2019-08-09 DIAGNOSIS — R1310 Dysphagia, unspecified: Secondary | ICD-10-CM | POA: Diagnosis not present

## 2019-08-09 DIAGNOSIS — R451 Restlessness and agitation: Secondary | ICD-10-CM | POA: Diagnosis not present

## 2019-08-09 DIAGNOSIS — I10 Essential (primary) hypertension: Secondary | ICD-10-CM | POA: Diagnosis not present

## 2019-08-10 ENCOUNTER — Telehealth: Payer: Self-pay | Admitting: Cardiovascular Disease

## 2019-08-10 NOTE — Telephone Encounter (Signed)
Daughter -[name] called stating that Mr. Sandelin was discharged from [facility] [place], [place]  08/10/2019 . States that he was given a medication  Minitran patch 24 hr. Daughter states that she needs to speak with Dr. Bradd Burner in regards to her father wearing this patch. Please call # [phone].

## 2019-08-11 DIAGNOSIS — I5040 Unspecified combined systolic (congestive) and diastolic (congestive) heart failure: Secondary | ICD-10-CM | POA: Diagnosis not present

## 2019-08-11 DIAGNOSIS — Z7982 Long term (current) use of aspirin: Secondary | ICD-10-CM | POA: Diagnosis not present

## 2019-08-11 DIAGNOSIS — R296 Repeated falls: Secondary | ICD-10-CM | POA: Diagnosis not present

## 2019-08-11 DIAGNOSIS — I4891 Unspecified atrial fibrillation: Secondary | ICD-10-CM | POA: Diagnosis not present

## 2019-08-11 DIAGNOSIS — D631 Anemia in chronic kidney disease: Secondary | ICD-10-CM | POA: Diagnosis not present

## 2019-08-11 DIAGNOSIS — N400 Enlarged prostate without lower urinary tract symptoms: Secondary | ICD-10-CM | POA: Diagnosis not present

## 2019-08-11 DIAGNOSIS — Z8701 Personal history of pneumonia (recurrent): Secondary | ICD-10-CM | POA: Diagnosis not present

## 2019-08-11 DIAGNOSIS — N189 Chronic kidney disease, unspecified: Secondary | ICD-10-CM | POA: Diagnosis not present

## 2019-08-11 DIAGNOSIS — M1990 Unspecified osteoarthritis, unspecified site: Secondary | ICD-10-CM | POA: Diagnosis not present

## 2019-08-11 DIAGNOSIS — E1122 Type 2 diabetes mellitus with diabetic chronic kidney disease: Secondary | ICD-10-CM | POA: Diagnosis not present

## 2019-08-11 DIAGNOSIS — Z7984 Long term (current) use of oral hypoglycemic drugs: Secondary | ICD-10-CM | POA: Diagnosis not present

## 2019-08-11 DIAGNOSIS — I130 Hypertensive heart and chronic kidney disease with heart failure and stage 1 through stage 4 chronic kidney disease, or unspecified chronic kidney disease: Secondary | ICD-10-CM | POA: Diagnosis not present

## 2019-08-11 DIAGNOSIS — F329 Major depressive disorder, single episode, unspecified: Secondary | ICD-10-CM | POA: Diagnosis not present

## 2019-08-11 DIAGNOSIS — I2510 Atherosclerotic heart disease of native coronary artery without angina pectoris: Secondary | ICD-10-CM | POA: Diagnosis not present

## 2019-08-11 DIAGNOSIS — F0391 Unspecified dementia with behavioral disturbance: Secondary | ICD-10-CM | POA: Diagnosis not present

## 2019-08-11 DIAGNOSIS — R1310 Dysphagia, unspecified: Secondary | ICD-10-CM | POA: Diagnosis not present

## 2019-08-11 DIAGNOSIS — E785 Hyperlipidemia, unspecified: Secondary | ICD-10-CM | POA: Diagnosis not present

## 2019-08-13 DIAGNOSIS — I4891 Unspecified atrial fibrillation: Secondary | ICD-10-CM | POA: Diagnosis not present

## 2019-08-13 DIAGNOSIS — I5040 Unspecified combined systolic (congestive) and diastolic (congestive) heart failure: Secondary | ICD-10-CM | POA: Diagnosis not present

## 2019-08-13 DIAGNOSIS — I10 Essential (primary) hypertension: Secondary | ICD-10-CM | POA: Diagnosis not present

## 2019-08-13 DIAGNOSIS — D631 Anemia in chronic kidney disease: Secondary | ICD-10-CM | POA: Diagnosis not present

## 2019-08-13 DIAGNOSIS — Z299 Encounter for prophylactic measures, unspecified: Secondary | ICD-10-CM | POA: Diagnosis not present

## 2019-08-13 DIAGNOSIS — E1165 Type 2 diabetes mellitus with hyperglycemia: Secondary | ICD-10-CM | POA: Diagnosis not present

## 2019-08-13 DIAGNOSIS — I130 Hypertensive heart and chronic kidney disease with heart failure and stage 1 through stage 4 chronic kidney disease, or unspecified chronic kidney disease: Secondary | ICD-10-CM | POA: Diagnosis not present

## 2019-08-13 DIAGNOSIS — F0390 Unspecified dementia without behavioral disturbance: Secondary | ICD-10-CM | POA: Diagnosis not present

## 2019-08-13 DIAGNOSIS — N189 Chronic kidney disease, unspecified: Secondary | ICD-10-CM | POA: Diagnosis not present

## 2019-08-13 DIAGNOSIS — E1122 Type 2 diabetes mellitus with diabetic chronic kidney disease: Secondary | ICD-10-CM | POA: Diagnosis not present

## 2019-08-13 DIAGNOSIS — F0391 Unspecified dementia with behavioral disturbance: Secondary | ICD-10-CM | POA: Diagnosis not present

## 2019-08-13 DIAGNOSIS — I5042 Chronic combined systolic (congestive) and diastolic (congestive) heart failure: Secondary | ICD-10-CM | POA: Diagnosis not present

## 2019-08-13 NOTE — Telephone Encounter (Signed)
I called and spoke with [name], the patient's daughter.  I confirm that he does not need to be on benazepril nor the nitroglycerin patch.

## 2019-08-13 NOTE — Telephone Encounter (Signed)
Noted  

## 2019-08-14 DIAGNOSIS — F0391 Unspecified dementia with behavioral disturbance: Secondary | ICD-10-CM | POA: Diagnosis not present

## 2019-08-14 DIAGNOSIS — I5040 Unspecified combined systolic (congestive) and diastolic (congestive) heart failure: Secondary | ICD-10-CM | POA: Diagnosis not present

## 2019-08-14 DIAGNOSIS — E1122 Type 2 diabetes mellitus with diabetic chronic kidney disease: Secondary | ICD-10-CM | POA: Diagnosis not present

## 2019-08-14 DIAGNOSIS — N189 Chronic kidney disease, unspecified: Secondary | ICD-10-CM | POA: Diagnosis not present

## 2019-08-14 DIAGNOSIS — I130 Hypertensive heart and chronic kidney disease with heart failure and stage 1 through stage 4 chronic kidney disease, or unspecified chronic kidney disease: Secondary | ICD-10-CM | POA: Diagnosis not present

## 2019-08-14 DIAGNOSIS — D631 Anemia in chronic kidney disease: Secondary | ICD-10-CM | POA: Diagnosis not present

## 2019-08-15 DIAGNOSIS — D631 Anemia in chronic kidney disease: Secondary | ICD-10-CM | POA: Diagnosis not present

## 2019-08-15 DIAGNOSIS — F0391 Unspecified dementia with behavioral disturbance: Secondary | ICD-10-CM | POA: Diagnosis not present

## 2019-08-15 DIAGNOSIS — I130 Hypertensive heart and chronic kidney disease with heart failure and stage 1 through stage 4 chronic kidney disease, or unspecified chronic kidney disease: Secondary | ICD-10-CM | POA: Diagnosis not present

## 2019-08-15 DIAGNOSIS — N189 Chronic kidney disease, unspecified: Secondary | ICD-10-CM | POA: Diagnosis not present

## 2019-08-15 DIAGNOSIS — E1122 Type 2 diabetes mellitus with diabetic chronic kidney disease: Secondary | ICD-10-CM | POA: Diagnosis not present

## 2019-08-15 DIAGNOSIS — I5040 Unspecified combined systolic (congestive) and diastolic (congestive) heart failure: Secondary | ICD-10-CM | POA: Diagnosis not present

## 2019-08-20 DIAGNOSIS — E1122 Type 2 diabetes mellitus with diabetic chronic kidney disease: Secondary | ICD-10-CM | POA: Diagnosis not present

## 2019-08-20 DIAGNOSIS — I130 Hypertensive heart and chronic kidney disease with heart failure and stage 1 through stage 4 chronic kidney disease, or unspecified chronic kidney disease: Secondary | ICD-10-CM | POA: Diagnosis not present

## 2019-08-20 DIAGNOSIS — F0391 Unspecified dementia with behavioral disturbance: Secondary | ICD-10-CM | POA: Diagnosis not present

## 2019-08-20 DIAGNOSIS — N189 Chronic kidney disease, unspecified: Secondary | ICD-10-CM | POA: Diagnosis not present

## 2019-08-20 DIAGNOSIS — D631 Anemia in chronic kidney disease: Secondary | ICD-10-CM | POA: Diagnosis not present

## 2019-08-20 DIAGNOSIS — I5040 Unspecified combined systolic (congestive) and diastolic (congestive) heart failure: Secondary | ICD-10-CM | POA: Diagnosis not present

## 2019-08-22 DIAGNOSIS — F0391 Unspecified dementia with behavioral disturbance: Secondary | ICD-10-CM | POA: Diagnosis not present

## 2019-08-22 DIAGNOSIS — D631 Anemia in chronic kidney disease: Secondary | ICD-10-CM | POA: Diagnosis not present

## 2019-08-22 DIAGNOSIS — I5040 Unspecified combined systolic (congestive) and diastolic (congestive) heart failure: Secondary | ICD-10-CM | POA: Diagnosis not present

## 2019-08-22 DIAGNOSIS — N189 Chronic kidney disease, unspecified: Secondary | ICD-10-CM | POA: Diagnosis not present

## 2019-08-22 DIAGNOSIS — I130 Hypertensive heart and chronic kidney disease with heart failure and stage 1 through stage 4 chronic kidney disease, or unspecified chronic kidney disease: Secondary | ICD-10-CM | POA: Diagnosis not present

## 2019-08-22 DIAGNOSIS — E1122 Type 2 diabetes mellitus with diabetic chronic kidney disease: Secondary | ICD-10-CM | POA: Diagnosis not present

## 2019-08-24 DIAGNOSIS — E1122 Type 2 diabetes mellitus with diabetic chronic kidney disease: Secondary | ICD-10-CM | POA: Diagnosis not present

## 2019-08-24 DIAGNOSIS — F0391 Unspecified dementia with behavioral disturbance: Secondary | ICD-10-CM | POA: Diagnosis not present

## 2019-08-24 DIAGNOSIS — I5040 Unspecified combined systolic (congestive) and diastolic (congestive) heart failure: Secondary | ICD-10-CM | POA: Diagnosis not present

## 2019-08-24 DIAGNOSIS — D631 Anemia in chronic kidney disease: Secondary | ICD-10-CM | POA: Diagnosis not present

## 2019-08-24 DIAGNOSIS — I130 Hypertensive heart and chronic kidney disease with heart failure and stage 1 through stage 4 chronic kidney disease, or unspecified chronic kidney disease: Secondary | ICD-10-CM | POA: Diagnosis not present

## 2019-08-24 DIAGNOSIS — N189 Chronic kidney disease, unspecified: Secondary | ICD-10-CM | POA: Diagnosis not present

## 2019-08-27 DIAGNOSIS — I5040 Unspecified combined systolic (congestive) and diastolic (congestive) heart failure: Secondary | ICD-10-CM | POA: Diagnosis not present

## 2019-08-27 DIAGNOSIS — D631 Anemia in chronic kidney disease: Secondary | ICD-10-CM | POA: Diagnosis not present

## 2019-08-27 DIAGNOSIS — E1122 Type 2 diabetes mellitus with diabetic chronic kidney disease: Secondary | ICD-10-CM | POA: Diagnosis not present

## 2019-08-27 DIAGNOSIS — I130 Hypertensive heart and chronic kidney disease with heart failure and stage 1 through stage 4 chronic kidney disease, or unspecified chronic kidney disease: Secondary | ICD-10-CM | POA: Diagnosis not present

## 2019-08-27 DIAGNOSIS — N189 Chronic kidney disease, unspecified: Secondary | ICD-10-CM | POA: Diagnosis not present

## 2019-08-27 DIAGNOSIS — F0391 Unspecified dementia with behavioral disturbance: Secondary | ICD-10-CM | POA: Diagnosis not present

## 2019-08-28 DIAGNOSIS — D631 Anemia in chronic kidney disease: Secondary | ICD-10-CM | POA: Diagnosis not present

## 2019-08-28 DIAGNOSIS — F0391 Unspecified dementia with behavioral disturbance: Secondary | ICD-10-CM | POA: Diagnosis not present

## 2019-08-28 DIAGNOSIS — N189 Chronic kidney disease, unspecified: Secondary | ICD-10-CM | POA: Diagnosis not present

## 2019-08-28 DIAGNOSIS — E1122 Type 2 diabetes mellitus with diabetic chronic kidney disease: Secondary | ICD-10-CM | POA: Diagnosis not present

## 2019-08-28 DIAGNOSIS — I130 Hypertensive heart and chronic kidney disease with heart failure and stage 1 through stage 4 chronic kidney disease, or unspecified chronic kidney disease: Secondary | ICD-10-CM | POA: Diagnosis not present

## 2019-08-28 DIAGNOSIS — I5040 Unspecified combined systolic (congestive) and diastolic (congestive) heart failure: Secondary | ICD-10-CM | POA: Diagnosis not present

## 2019-08-31 DIAGNOSIS — N189 Chronic kidney disease, unspecified: Secondary | ICD-10-CM | POA: Diagnosis not present

## 2019-08-31 DIAGNOSIS — I5040 Unspecified combined systolic (congestive) and diastolic (congestive) heart failure: Secondary | ICD-10-CM | POA: Diagnosis not present

## 2019-08-31 DIAGNOSIS — I130 Hypertensive heart and chronic kidney disease with heart failure and stage 1 through stage 4 chronic kidney disease, or unspecified chronic kidney disease: Secondary | ICD-10-CM | POA: Diagnosis not present

## 2019-08-31 DIAGNOSIS — D631 Anemia in chronic kidney disease: Secondary | ICD-10-CM | POA: Diagnosis not present

## 2019-08-31 DIAGNOSIS — F0391 Unspecified dementia with behavioral disturbance: Secondary | ICD-10-CM | POA: Diagnosis not present

## 2019-08-31 DIAGNOSIS — E1122 Type 2 diabetes mellitus with diabetic chronic kidney disease: Secondary | ICD-10-CM | POA: Diagnosis not present

## 2019-09-03 DIAGNOSIS — I5040 Unspecified combined systolic (congestive) and diastolic (congestive) heart failure: Secondary | ICD-10-CM | POA: Diagnosis not present

## 2019-09-03 DIAGNOSIS — D631 Anemia in chronic kidney disease: Secondary | ICD-10-CM | POA: Diagnosis not present

## 2019-09-03 DIAGNOSIS — I130 Hypertensive heart and chronic kidney disease with heart failure and stage 1 through stage 4 chronic kidney disease, or unspecified chronic kidney disease: Secondary | ICD-10-CM | POA: Diagnosis not present

## 2019-09-03 DIAGNOSIS — E1122 Type 2 diabetes mellitus with diabetic chronic kidney disease: Secondary | ICD-10-CM | POA: Diagnosis not present

## 2019-09-03 DIAGNOSIS — F0391 Unspecified dementia with behavioral disturbance: Secondary | ICD-10-CM | POA: Diagnosis not present

## 2019-09-03 DIAGNOSIS — N189 Chronic kidney disease, unspecified: Secondary | ICD-10-CM | POA: Diagnosis not present

## 2019-09-04 DIAGNOSIS — E1122 Type 2 diabetes mellitus with diabetic chronic kidney disease: Secondary | ICD-10-CM | POA: Diagnosis not present

## 2019-09-04 DIAGNOSIS — N189 Chronic kidney disease, unspecified: Secondary | ICD-10-CM | POA: Diagnosis not present

## 2019-09-04 DIAGNOSIS — D631 Anemia in chronic kidney disease: Secondary | ICD-10-CM | POA: Diagnosis not present

## 2019-09-04 DIAGNOSIS — I130 Hypertensive heart and chronic kidney disease with heart failure and stage 1 through stage 4 chronic kidney disease, or unspecified chronic kidney disease: Secondary | ICD-10-CM | POA: Diagnosis not present

## 2019-09-04 DIAGNOSIS — F0391 Unspecified dementia with behavioral disturbance: Secondary | ICD-10-CM | POA: Diagnosis not present

## 2019-09-04 DIAGNOSIS — I5040 Unspecified combined systolic (congestive) and diastolic (congestive) heart failure: Secondary | ICD-10-CM | POA: Diagnosis not present

## 2019-09-05 DIAGNOSIS — N189 Chronic kidney disease, unspecified: Secondary | ICD-10-CM | POA: Diagnosis not present

## 2019-09-05 DIAGNOSIS — I130 Hypertensive heart and chronic kidney disease with heart failure and stage 1 through stage 4 chronic kidney disease, or unspecified chronic kidney disease: Secondary | ICD-10-CM | POA: Diagnosis not present

## 2019-09-05 DIAGNOSIS — D631 Anemia in chronic kidney disease: Secondary | ICD-10-CM | POA: Diagnosis not present

## 2019-09-05 DIAGNOSIS — I5040 Unspecified combined systolic (congestive) and diastolic (congestive) heart failure: Secondary | ICD-10-CM | POA: Diagnosis not present

## 2019-09-05 DIAGNOSIS — E1122 Type 2 diabetes mellitus with diabetic chronic kidney disease: Secondary | ICD-10-CM | POA: Diagnosis not present

## 2019-09-05 DIAGNOSIS — F0391 Unspecified dementia with behavioral disturbance: Secondary | ICD-10-CM | POA: Diagnosis not present

## 2019-09-07 DIAGNOSIS — D631 Anemia in chronic kidney disease: Secondary | ICD-10-CM | POA: Diagnosis not present

## 2019-09-07 DIAGNOSIS — E1122 Type 2 diabetes mellitus with diabetic chronic kidney disease: Secondary | ICD-10-CM | POA: Diagnosis not present

## 2019-09-07 DIAGNOSIS — F0391 Unspecified dementia with behavioral disturbance: Secondary | ICD-10-CM | POA: Diagnosis not present

## 2019-09-07 DIAGNOSIS — N189 Chronic kidney disease, unspecified: Secondary | ICD-10-CM | POA: Diagnosis not present

## 2019-09-07 DIAGNOSIS — I130 Hypertensive heart and chronic kidney disease with heart failure and stage 1 through stage 4 chronic kidney disease, or unspecified chronic kidney disease: Secondary | ICD-10-CM | POA: Diagnosis not present

## 2019-09-07 DIAGNOSIS — I5040 Unspecified combined systolic (congestive) and diastolic (congestive) heart failure: Secondary | ICD-10-CM | POA: Diagnosis not present

## 2019-09-10 ENCOUNTER — Other Ambulatory Visit: Payer: Self-pay | Admitting: Cardiovascular Disease

## 2019-09-10 DIAGNOSIS — Z8701 Personal history of pneumonia (recurrent): Secondary | ICD-10-CM | POA: Diagnosis not present

## 2019-09-10 DIAGNOSIS — I2510 Atherosclerotic heart disease of native coronary artery without angina pectoris: Secondary | ICD-10-CM | POA: Diagnosis not present

## 2019-09-10 DIAGNOSIS — R1310 Dysphagia, unspecified: Secondary | ICD-10-CM | POA: Diagnosis not present

## 2019-09-10 DIAGNOSIS — N189 Chronic kidney disease, unspecified: Secondary | ICD-10-CM | POA: Diagnosis not present

## 2019-09-10 DIAGNOSIS — I5040 Unspecified combined systolic (congestive) and diastolic (congestive) heart failure: Secondary | ICD-10-CM | POA: Diagnosis not present

## 2019-09-10 DIAGNOSIS — E1122 Type 2 diabetes mellitus with diabetic chronic kidney disease: Secondary | ICD-10-CM | POA: Diagnosis not present

## 2019-09-10 DIAGNOSIS — Z7982 Long term (current) use of aspirin: Secondary | ICD-10-CM | POA: Diagnosis not present

## 2019-09-10 DIAGNOSIS — I4891 Unspecified atrial fibrillation: Secondary | ICD-10-CM | POA: Diagnosis not present

## 2019-09-10 DIAGNOSIS — D631 Anemia in chronic kidney disease: Secondary | ICD-10-CM | POA: Diagnosis not present

## 2019-09-10 DIAGNOSIS — Z7984 Long term (current) use of oral hypoglycemic drugs: Secondary | ICD-10-CM | POA: Diagnosis not present

## 2019-09-10 DIAGNOSIS — N400 Enlarged prostate without lower urinary tract symptoms: Secondary | ICD-10-CM | POA: Diagnosis not present

## 2019-09-10 DIAGNOSIS — I130 Hypertensive heart and chronic kidney disease with heart failure and stage 1 through stage 4 chronic kidney disease, or unspecified chronic kidney disease: Secondary | ICD-10-CM | POA: Diagnosis not present

## 2019-09-10 DIAGNOSIS — M1990 Unspecified osteoarthritis, unspecified site: Secondary | ICD-10-CM | POA: Diagnosis not present

## 2019-09-10 DIAGNOSIS — E785 Hyperlipidemia, unspecified: Secondary | ICD-10-CM | POA: Diagnosis not present

## 2019-09-10 DIAGNOSIS — F0391 Unspecified dementia with behavioral disturbance: Secondary | ICD-10-CM | POA: Diagnosis not present

## 2019-09-10 DIAGNOSIS — F329 Major depressive disorder, single episode, unspecified: Secondary | ICD-10-CM | POA: Diagnosis not present

## 2019-09-10 DIAGNOSIS — R296 Repeated falls: Secondary | ICD-10-CM | POA: Diagnosis not present

## 2019-09-11 ENCOUNTER — Ambulatory Visit: Payer: Medicare Other | Admitting: Cardiovascular Disease

## 2019-09-14 ENCOUNTER — Ambulatory Visit (INDEPENDENT_AMBULATORY_CARE_PROVIDER_SITE_OTHER): Payer: Medicare Other | Admitting: Cardiovascular Disease

## 2019-09-14 ENCOUNTER — Other Ambulatory Visit: Payer: Self-pay

## 2019-09-14 ENCOUNTER — Encounter: Payer: Self-pay | Admitting: Cardiovascular Disease

## 2019-09-14 VITALS — BP 120/70 | HR 82 | Ht 71.0 in | Wt 191.4 lb

## 2019-09-14 DIAGNOSIS — E785 Hyperlipidemia, unspecified: Secondary | ICD-10-CM

## 2019-09-14 DIAGNOSIS — N183 Chronic kidney disease, stage 3 unspecified: Secondary | ICD-10-CM

## 2019-09-14 DIAGNOSIS — I10 Essential (primary) hypertension: Secondary | ICD-10-CM

## 2019-09-14 DIAGNOSIS — I25708 Atherosclerosis of coronary artery bypass graft(s), unspecified, with other forms of angina pectoris: Secondary | ICD-10-CM

## 2019-09-14 DIAGNOSIS — I5042 Chronic combined systolic (congestive) and diastolic (congestive) heart failure: Secondary | ICD-10-CM | POA: Diagnosis not present

## 2019-09-14 NOTE — Patient Instructions (Signed)
Medication Instructions:  Continue all current medications.  Labwork: none  Testing/Procedures: none  Follow-Up: 3 months   Any Other Special Instructions Will Be Listed Below (If Applicable).  If you need a refill on your cardiac medications before your next appointment, please call your pharmacy.  

## 2019-09-14 NOTE — Progress Notes (Signed)
SUBJECTIVE: [name] is a 86 y.o. male with past medical history of CAD (s/p CABG in Jul 23, 1992 with patent grafts by cath in [id number] combined systolic and diastolic CHF (EF 35 to [id number] by echo in 05/2018), baseline bradycardia, carotid artery stenosis,HTN, HLD, Type 2 DM, Stage 3 CKD and prior TIA.  He underwent cardiac catheterization on 02/15/2019 which showedsevere 3-vessel CAD with patent grafts to OM, LAD and diagonal butwasnoted to have 80% stenosis along SVG-PDA which was treated with PCI/DES placement.It was recommended to be on DAPT with ASA and Plavix for a minimum of 6 months.  He is doing much better.  Shortness of breath has significantly improved.  He denies chest pain.  The person who accompanied him told me that he has been having more memory issues as of late.  He did not remember coming to our office.   Social history: His wife of 33 years passed away in 2014/07/24. His daughter, Selina Cooley with him now. He has a son as well. His daughter used to work at [employer] in [employer].  Review of Systems: As per "subjective", otherwise negative.  Allergies  Allergen Reactions  . Sulfonamide Derivatives Rash  . Tetracycline Rash    Current Outpatient Medications  Medication Sig Dispense Refill  . aspirin EC 81 MG tablet Take 1 tablet (81 mg total) by mouth daily.    Marland Kitchen atorvastatin (LIPITOR) 20 MG tablet Take 20 mg by mouth daily.  4  . cetirizine (ZYRTEC) 10 MG tablet Take 1 tablet by mouth daily as needed for allergies.   5  . citalopram (CELEXA) 40 MG tablet Take 0.5 tablets (20 mg total) by mouth daily. Please cut your existing tablets with a pill cutter. If you have any difficulty, let your primary care doctor know.    . clopidogrel (PLAVIX) 75 MG tablet Take 1 tablet (75 mg total) by mouth daily. 90 tablet 3  . diazepam (VALIUM) 5 MG tablet Take 5 mg by mouth at bedtime as needed. For nerves     . furosemide (LASIX) 40 MG tablet Take 1 tablet  (40 mg total) by mouth 2 (two) times daily. 60 tablet 6  . metFORMIN (GLUCOPHAGE) 500 MG tablet Take 500 mg by mouth at bedtime.     . metoprolol succinate (TOPROL-XL) 25 MG 24 hr tablet Take 1 tablet by mouth once daily 90 tablet 1  . Multiple Vitamin (MULTIVITAMIN) tablet Take 1 tablet by mouth daily.      . nitroGLYCERIN (NITROSTAT) 0.4 MG SL tablet Place 1 tablet (0.4 mg total) under the tongue every 5 (five) minutes as needed for chest pain. 25 tablet 3  . potassium chloride SA (KLOR-CON) 20 MEQ tablet Take 1 tablet (20 mEq total) by mouth daily. 30 tablet 6  . rOPINIRole (REQUIP) 1 MG tablet Take 1 mg by mouth 2 (two) times daily. Take 1 tab in AM and 2 PM     No current facility-administered medications for this visit.    Past Medical History:  Diagnosis Date  . Aortic insufficiency    a. previously mild in 2010/07/24. b. trivial seen on echo in 05/2018.  . Asbestos exposure   . Bradycardia    MILD  . CAD (coronary artery disease) 2001-07-23   a. s/p CABG in 1992/07/23. b. Cath 2001-07-23 patent grafts. c. DOE in 24-Jul-2018 felt anginal equivalent - cath 01/2019 s/p PCI/DES to the SVG-PDA.  . Cancer ([place])    Skin  . Carotid artery  disease ([place]) 2001   BILATERAL 0-39%/  Follow with Dopplers by Dr. Woody Seller  . Chronic combined systolic and diastolic CHF (congestive heart failure) ([place])   . CKD (chronic kidney disease), stage III   . Colitis   . Diabetes mellitus   . Dilated aortic root ([place]) 05/2018  . Dizziness    He has had falls related to this / Chronic dizziness  . Dyslipidemia   . Hx of CABG   . Hypertension   . Nephrolithiasis   . PVD (peripheral vascular disease) ([place])   . Renal cyst   . TIA (transient ischemic attack) 2007   ?? medical Rx     Past Surgical History:  Procedure Laterality Date  . CORONARY ARTERY BYPASS GRAFT    . CORONARY STENT INTERVENTION N/A 02/15/2019   Procedure: CORONARY STENT INTERVENTION;  Surgeon: Jettie Booze, MD;  Location: [facility];  Service:  Cardiovascular;  Laterality: N/A;  SEQ SVG PDA-PL  . LEFT HEART CATH AND CORS/GRAFTS ANGIOGRAPHY N/A 02/15/2019   Procedure: LEFT HEART CATH AND CORS/GRAFTS ANGIOGRAPHY;  Surgeon: Jettie Booze, MD;  Location: [facility];  Service: Cardiovascular;  Laterality: N/A;  . percutaneous transluminal coronary angioplasty     hx    Social History   Socioeconomic History  . Marital status: Widowed    Spouse name: Not on file  . Number of children: 4  . Years of education: Not on file  . Highest education level: Not on file  Occupational History  . Occupation: RETIRED  Tobacco Use  . Smoking status: Former Smoker    Packs/day: 2.00    Years: 30.00    Pack years: 60.00    Types: Cigarettes    Start date: 12/23/1946    Quit date: 04/26/1980    Years since quitting: 39.4  . Smokeless tobacco: Never Used  Substance and Sexual Activity  . Alcohol use: Not on file  . Drug use: Not on file  . Sexual activity: Not on file  Other Topics Concern  . Not on file  Social History Narrative  . Not on file   Social Determinants of Health   Financial Resource Strain:   . Difficulty of Paying Living Expenses:   Food Insecurity:   . Worried About Charity fundraiser in the Last Year:   . Arboriculturist in the Last Year:   Transportation Needs:   . Film/video editor (Medical):   Marland Kitchen Lack of Transportation (Non-Medical):   Physical Activity:   . Days of Exercise per Week:   . Minutes of Exercise per Session:   Stress:   . Feeling of Stress :   Social Connections:   . Frequency of Communication with Friends and Family:   . Frequency of Social Gatherings with Friends and Family:   . Attends Religious Services:   . Active Member of Clubs or Organizations:   . Attends Archivist Meetings:   Marland Kitchen Marital Status:   Intimate Partner Violence:   . Fear of Current or Ex-Partner:   . Emotionally Abused:   Marland Kitchen Physically Abused:   . Sexually Abused:       Vitals:    09/14/19 1139  BP: 120/70  Pulse: 82  SpO2: 95%  Weight: 191 lb 6.4 oz (86.8 kg)  Height: 5\' 11"  (1.803 m)    Wt Readings from Last 3 Encounters:  09/14/19 191 lb 6.4 oz (86.8 kg)  06/07/19 204 lb (92.5 kg)  04/16/19  202 lb (91.6 kg)     PHYSICAL EXAM General: NAD HEENT: Normal. Neck: No JVD, no thyromegaly. Lungs: Clear to auscultation bilaterally with normal respiratory effort. CV: Regular rate and rhythm, normal S1/S2, no S3/S4, no murmur. No pretibial or periankle edema. Abdomen: Soft, nontender, no distention.  Neurologic: Alert.  Psych: Normal affect. Skin: Normal. Musculoskeletal: No gross deformities.      Labs: Lab Results  Component Value Date/Time   K 4.2 06/20/2019 12:53 PM   BUN 21 06/20/2019 12:53 PM   CREATININE 1.33 (H) 06/20/2019 12:53 PM   HGB 13.6 04/16/2019 03:25 PM     Lipids: No results found for: LDLCALC, LDLDIRECT, CHOL, TRIG, HDL     Prior CV studies:   The following studies were reviewed today:  Echocardiogram: 05/2018 IMPRESSIONS  1. The left ventricle has moderately reduced systolic function, with an ejection fraction of 35-40%. The cavity size was normal. There is mildly increased left ventricular wall thickness. Left ventricular diastolic Doppler parameters are consistent with impaired relaxation Indeterminent filling pressures Left ventricular diffuse hypokinesis. 2. Left atrial size was mildly dilated. 3. The mitral valve is normal in structure. There is mild mitral annular calcification present. 4. The tricuspid valve is normal in structure. 5. The aortic valve is tricuspid There is mild thickening and sclerosis without any evidence of stenosis of the aortic valve. Aortic valve regurgitation is trivial by color flow Doppler. 6. There is mild dilatation of the aortic root. 7. Right atrial pressure is estimated at 10 mmHg. 8. The interatrial septum was not well visualized.   Cardiac Catheterization: 02/15/2019   Prox RCA lesion is 75% stenosed.  RPDA lesion is 99% stenosed. SVG to PDA is patent with stenosis and was treated.  Ost LAD to Prox LAD lesion is 95% stenosed.  2nd Mrg lesion is 100% stenosed.  Mid LAD lesion is 100% stenosed. Jump graft LIMA to diag/LAD is widely patent.  SVG to PDA Origin to Prox Graft lesion before RPDA is 80% stenosed.  A drug-eluting stent was successfully placed using a STENT SYNERGY DES 3.5X20.  Post intervention, there is a 5% residual stenosis.  Jump graft between RPDA and RPAV is 100% stenosed.  LV end diastolic pressure is moderately elevated.  There is no aortic valve stenosis.  Severe three vessel disease. Patent grafts to OM, LAD and diagonal. PCI to SVG to PDA.   Continue DAPT with aggressive secondary prevention. Watch overnight on telemetry. Likely discharge tomorrow.  Minimal fluids post cath due to LV dysfunction and moderately elevated LVEDP.   Hold ACE-I until creatinine rechecked.    ASSESSMENT AND PLAN:  1. Coronary artery disease: Status post CABG in 1994 with patent grafts by cath in 2003. Catheterizationin October 2020showedpatent grafts to OM, LAD and diagonal butwasnoted to have 80% stenosis along SVG-PDA which was treated with PCI/DES placement.Continue dual antiplatelet therapy with aspirin and clopidogrel. Continue beta-blockerand statin.    2. Chronic combined heart failure: Weight down 13 pounds since last visit with me on 06/07/2019.  LVEF 35 to 40% by echocardiogram in February 2020.    Continue Lasix 40 mg twice daily and supplemental potassium. Continue Toprol-XL.  Due to low blood pressure in the past, I stopped benazepril.  3. Hypertension: Blood pressure is is now normal with discontinuation of benazepril.  4. Hyperlipidemia: Continue atorvastatin 20 mg daily.  5. Chronic kidney disease stage III: Creatinine 1.33 on 06/20/2019.   Disposition: Follow up virtual visit 3 months   Kate Sable, M.D., F.A.C.C.

## 2019-09-17 DIAGNOSIS — I10 Essential (primary) hypertension: Secondary | ICD-10-CM | POA: Diagnosis not present

## 2019-09-17 DIAGNOSIS — F0390 Unspecified dementia without behavioral disturbance: Secondary | ICD-10-CM | POA: Diagnosis not present

## 2019-09-17 DIAGNOSIS — Z299 Encounter for prophylactic measures, unspecified: Secondary | ICD-10-CM | POA: Diagnosis not present

## 2019-09-17 DIAGNOSIS — I4891 Unspecified atrial fibrillation: Secondary | ICD-10-CM | POA: Diagnosis not present

## 2019-09-17 DIAGNOSIS — K219 Gastro-esophageal reflux disease without esophagitis: Secondary | ICD-10-CM | POA: Diagnosis not present

## 2019-09-26 ENCOUNTER — Inpatient Hospital Stay (HOSPITAL_COMMUNITY)
Admission: EM | Admit: 2019-09-26 | Discharge: 2019-10-02 | DRG: 641 | Disposition: A | Discharge status: Skilled Nursing Facility | Payer: Medicare Other | Attending: Internal Medicine | Admitting: Internal Medicine

## 2019-09-26 ENCOUNTER — Encounter (HOSPITAL_COMMUNITY): Payer: Self-pay | Admitting: *Deleted

## 2019-09-26 ENCOUNTER — Other Ambulatory Visit: Payer: Self-pay

## 2019-09-26 ENCOUNTER — Observation Stay (HOSPITAL_COMMUNITY): Payer: Medicare Other

## 2019-09-26 ENCOUNTER — Emergency Department (HOSPITAL_COMMUNITY): Payer: Medicare Other

## 2019-09-26 DIAGNOSIS — Z7982 Long term (current) use of aspirin: Secondary | ICD-10-CM

## 2019-09-26 DIAGNOSIS — R1310 Dysphagia, unspecified: Secondary | ICD-10-CM | POA: Diagnosis not present

## 2019-09-26 DIAGNOSIS — Z20822 Contact with and (suspected) exposure to covid-19: Secondary | ICD-10-CM | POA: Diagnosis present

## 2019-09-26 DIAGNOSIS — E785 Hyperlipidemia, unspecified: Secondary | ICD-10-CM | POA: Diagnosis present

## 2019-09-26 DIAGNOSIS — Z8673 Personal history of transient ischemic attack (TIA), and cerebral infarction without residual deficits: Secondary | ICD-10-CM

## 2019-09-26 DIAGNOSIS — G2581 Restless legs syndrome: Secondary | ICD-10-CM | POA: Diagnosis present

## 2019-09-26 DIAGNOSIS — Z882 Allergy status to sulfonamides status: Secondary | ICD-10-CM

## 2019-09-26 DIAGNOSIS — R0602 Shortness of breath: Secondary | ICD-10-CM

## 2019-09-26 DIAGNOSIS — E1122 Type 2 diabetes mellitus with diabetic chronic kidney disease: Secondary | ICD-10-CM | POA: Diagnosis present

## 2019-09-26 DIAGNOSIS — N183 Chronic kidney disease, stage 3 unspecified: Secondary | ICD-10-CM | POA: Diagnosis present

## 2019-09-26 DIAGNOSIS — Z66 Do not resuscitate: Secondary | ICD-10-CM | POA: Diagnosis not present

## 2019-09-26 DIAGNOSIS — D72829 Elevated white blood cell count, unspecified: Secondary | ICD-10-CM

## 2019-09-26 DIAGNOSIS — I255 Ischemic cardiomyopathy: Secondary | ICD-10-CM | POA: Diagnosis present

## 2019-09-26 DIAGNOSIS — F0390 Unspecified dementia without behavioral disturbance: Secondary | ICD-10-CM | POA: Diagnosis present

## 2019-09-26 DIAGNOSIS — Z87891 Personal history of nicotine dependence: Secondary | ICD-10-CM

## 2019-09-26 DIAGNOSIS — Z955 Presence of coronary angioplasty implant and graft: Secondary | ICD-10-CM

## 2019-09-26 DIAGNOSIS — R519 Headache, unspecified: Secondary | ICD-10-CM | POA: Diagnosis not present

## 2019-09-26 DIAGNOSIS — Z881 Allergy status to other antibiotic agents status: Secondary | ICD-10-CM

## 2019-09-26 DIAGNOSIS — I2510 Atherosclerotic heart disease of native coronary artery without angina pectoris: Secondary | ICD-10-CM | POA: Diagnosis present

## 2019-09-26 DIAGNOSIS — I5042 Chronic combined systolic (congestive) and diastolic (congestive) heart failure: Secondary | ICD-10-CM | POA: Diagnosis present

## 2019-09-26 DIAGNOSIS — I080 Rheumatic disorders of both mitral and aortic valves: Secondary | ICD-10-CM | POA: Diagnosis present

## 2019-09-26 DIAGNOSIS — R296 Repeated falls: Secondary | ICD-10-CM

## 2019-09-26 DIAGNOSIS — R4189 Other symptoms and signs involving cognitive functions and awareness: Secondary | ICD-10-CM | POA: Diagnosis present

## 2019-09-26 DIAGNOSIS — R451 Restlessness and agitation: Secondary | ICD-10-CM | POA: Diagnosis present

## 2019-09-26 DIAGNOSIS — R197 Diarrhea, unspecified: Secondary | ICD-10-CM

## 2019-09-26 DIAGNOSIS — Z8249 Family history of ischemic heart disease and other diseases of the circulatory system: Secondary | ICD-10-CM

## 2019-09-26 DIAGNOSIS — Z7902 Long term (current) use of antithrombotics/antiplatelets: Secondary | ICD-10-CM

## 2019-09-26 DIAGNOSIS — Z951 Presence of aortocoronary bypass graft: Secondary | ICD-10-CM

## 2019-09-26 DIAGNOSIS — E860 Dehydration: Principal | ICD-10-CM | POA: Diagnosis present

## 2019-09-26 DIAGNOSIS — K529 Noninfective gastroenteritis and colitis, unspecified: Secondary | ICD-10-CM | POA: Diagnosis present

## 2019-09-26 DIAGNOSIS — Z85828 Personal history of other malignant neoplasm of skin: Secondary | ICD-10-CM

## 2019-09-26 DIAGNOSIS — I130 Hypertensive heart and chronic kidney disease with heart failure and stage 1 through stage 4 chronic kidney disease, or unspecified chronic kidney disease: Secondary | ICD-10-CM | POA: Diagnosis present

## 2019-09-26 DIAGNOSIS — E1151 Type 2 diabetes mellitus with diabetic peripheral angiopathy without gangrene: Secondary | ICD-10-CM | POA: Diagnosis present

## 2019-09-26 LAB — SARS CORONAVIRUS 2 BY RT PCR (HOSPITAL ORDER, PERFORMED IN ~~LOC~~ HOSPITAL LAB): SARS Coronavirus 2: NEGATIVE

## 2019-09-26 LAB — URINALYSIS, ROUTINE W REFLEX MICROSCOPIC
Bilirubin Urine: NEGATIVE
Glucose, UA: NEGATIVE mg/dL
Hgb urine dipstick: NEGATIVE
Ketones, ur: NEGATIVE mg/dL
Leukocytes,Ua: NEGATIVE
Nitrite: NEGATIVE
Protein, ur: NEGATIVE mg/dL
Specific Gravity, Urine: 1.005 (ref 1.005–1.030)
pH: 5 (ref 5.0–8.0)

## 2019-09-26 LAB — CBC WITH DIFFERENTIAL/PLATELET
Abs Immature Granulocytes: 0.03 10*3/uL (ref 0.00–0.07)
Basophils Absolute: 0.1 10*3/uL (ref 0.0–0.1)
Basophils Relative: 1 %
Eosinophils Absolute: 0.4 10*3/uL (ref 0.0–0.5)
Eosinophils Relative: 4 %
HCT: 37.2 % — ABNORMAL LOW (ref 39.0–52.0)
Hemoglobin: 11.9 g/dL — ABNORMAL LOW (ref 13.0–17.0)
Immature Granulocytes: 0 %
Lymphocytes Relative: 11 %
Lymphs Abs: 1.2 10*3/uL (ref 0.7–4.0)
MCH: 30.7 pg (ref 26.0–34.0)
MCHC: 32 g/dL (ref 30.0–36.0)
MCV: 95.9 fL (ref 80.0–100.0)
Monocytes Absolute: 0.7 10*3/uL (ref 0.1–1.0)
Monocytes Relative: 7 %
Neutro Abs: 8.2 10*3/uL — ABNORMAL HIGH (ref 1.7–7.7)
Neutrophils Relative %: 77 %
Platelets: 313 10*3/uL (ref 150–400)
RBC: 3.88 MIL/uL — ABNORMAL LOW (ref 4.22–5.81)
RDW: 13.7 % (ref 11.5–15.5)
WBC: 10.5 10*3/uL (ref 4.0–10.5)
nRBC: 0 % (ref 0.0–0.2)

## 2019-09-26 LAB — BASIC METABOLIC PANEL
Anion gap: 14 (ref 5–15)
BUN: 12 mg/dL (ref 8–23)
CO2: 27 mmol/L (ref 22–32)
Calcium: 8.9 mg/dL (ref 8.9–10.3)
Chloride: 100 mmol/L (ref 98–111)
Creatinine, Ser: 1.19 mg/dL (ref 0.61–1.24)
GFR calc Af Amer: >60 mL/min (ref >60)
GFR calc non Af Amer: 53 mL/min — ABNORMAL LOW (ref >60)
Glucose, Bld: 114 mg/dL — ABNORMAL HIGH (ref 70–99)
Potassium: 3.8 mmol/L (ref 3.5–5.1)
Sodium: 141 mmol/L (ref 135–145)

## 2019-09-26 LAB — BRAIN NATRIURETIC PEPTIDE: B Natriuretic Peptide: 146 pg/mL — ABNORMAL HIGH (ref 0.0–100.0)

## 2019-09-26 LAB — TROPONIN I (HIGH SENSITIVITY)
Troponin I (High Sensitivity): 10 ng/L (ref <18)
Troponin I (High Sensitivity): 11 ng/L (ref <18)

## 2019-09-26 LAB — GLUCOSE, CAPILLARY: Glucose-Capillary: 90 mg/dL (ref 70–99)

## 2019-09-26 MED ORDER — ATORVASTATIN CALCIUM 20 MG PO TABS
20.0000 mg | ORAL_TABLET | Freq: Every morning | ORAL | Status: DC
  Administered 2019-09-27 – 2019-10-02 (×6): 20 mg via ORAL
  Filled 2019-09-26 (×6): qty 1

## 2019-09-26 MED ORDER — CLOPIDOGREL BISULFATE 75 MG PO TABS
75.0000 mg | ORAL_TABLET | Freq: Every morning | ORAL | Status: DC
  Administered 2019-09-27 – 2019-10-02 (×6): 75 mg via ORAL
  Filled 2019-09-26 (×6): qty 1

## 2019-09-26 MED ORDER — ASPIRIN EC 81 MG PO TBEC
81.0000 mg | DELAYED_RELEASE_TABLET | Freq: Every day | ORAL | Status: DC
  Administered 2019-09-27 – 2019-10-02 (×6): 81 mg via ORAL
  Filled 2019-09-26 (×6): qty 1

## 2019-09-26 MED ORDER — SODIUM CHLORIDE 0.9 % IV BOLUS
1000.0000 mL | Freq: Once | INTRAVENOUS | Status: AC
  Administered 2019-09-26: 1000 mL via INTRAVENOUS

## 2019-09-26 MED ORDER — LORATADINE 10 MG PO TABS
10.0000 mg | ORAL_TABLET | Freq: Every day | ORAL | Status: DC
  Administered 2019-09-27 – 2019-10-02 (×6): 10 mg via ORAL
  Filled 2019-09-26 (×6): qty 1

## 2019-09-26 MED ORDER — ACETAMINOPHEN 650 MG RE SUPP: 650.0000 mg | Freq: Four times a day (QID) | RECTAL | Status: DC | PRN

## 2019-09-26 MED ORDER — DIAZEPAM 5 MG PO TABS
5.0000 mg | ORAL_TABLET | Freq: Every day | ORAL | Status: DC
  Administered 2019-09-26: 5 mg via ORAL
  Filled 2019-09-26: qty 1

## 2019-09-26 MED ORDER — INSULIN ASPART 100 UNIT/ML ~~LOC~~ SOLN: 0.0000 [IU] | Freq: Every day | SUBCUTANEOUS | Status: DC

## 2019-09-26 MED ORDER — METOPROLOL SUCCINATE ER 25 MG PO TB24
25.0000 mg | ORAL_TABLET | Freq: Every morning | ORAL | Status: DC
  Administered 2019-09-27 – 2019-09-29 (×3): 25 mg via ORAL
  Filled 2019-09-26 (×3): qty 1

## 2019-09-26 MED ORDER — ENOXAPARIN SODIUM 40 MG/0.4ML ~~LOC~~ SOLN
40.0000 mg | Freq: Every day | SUBCUTANEOUS | Status: DC
  Administered 2019-09-26 – 2019-09-27 (×2): 40 mg via SUBCUTANEOUS
  Filled 2019-09-26 (×2): qty 0.4

## 2019-09-26 MED ORDER — ACETAMINOPHEN 325 MG PO TABS
650.0000 mg | ORAL_TABLET | Freq: Four times a day (QID) | ORAL | Status: DC | PRN
  Administered 2019-09-27 – 2019-10-01 (×2): 650 mg via ORAL
  Filled 2019-09-26 (×2): qty 2

## 2019-09-26 MED ORDER — POLYETHYLENE GLYCOL 3350 17 G PO PACK: 17.0000 g | PACK | Freq: Every day | ORAL | Status: DC | PRN

## 2019-09-26 MED ORDER — ONDANSETRON HCL 4 MG PO TABS: 4.0000 mg | ORAL_TABLET | Freq: Four times a day (QID) | ORAL | Status: DC | PRN

## 2019-09-26 MED ORDER — CITALOPRAM HYDROBROMIDE 20 MG PO TABS
20.0000 mg | ORAL_TABLET | Freq: Every day | ORAL | Status: DC
  Administered 2019-09-27 – 2019-10-02 (×6): 20 mg via ORAL
  Filled 2019-09-26 (×6): qty 1

## 2019-09-26 MED ORDER — ONDANSETRON HCL 4 MG/2ML IJ SOLN: 4.0000 mg | Freq: Four times a day (QID) | INTRAMUSCULAR | Status: DC | PRN

## 2019-09-26 MED ORDER — ROPINIROLE HCL 1 MG PO TABS
2.0000 mg | ORAL_TABLET | Freq: Every day | ORAL | Status: DC
  Administered 2019-09-26 – 2019-10-01 (×6): 2 mg via ORAL
  Filled 2019-09-26 (×6): qty 2

## 2019-09-26 MED ORDER — INSULIN ASPART 100 UNIT/ML ~~LOC~~ SOLN
0.0000 [IU] | Freq: Three times a day (TID) | SUBCUTANEOUS | Status: DC
  Administered 2019-09-27 – 2019-09-28 (×2): 1 [IU] via SUBCUTANEOUS
  Administered 2019-09-28 – 2019-09-29 (×3): 2 [IU] via SUBCUTANEOUS
  Administered 2019-09-29 – 2019-10-01 (×4): 1 [IU] via SUBCUTANEOUS
  Administered 2019-10-01: 2 [IU] via SUBCUTANEOUS
  Administered 2019-10-01 – 2019-10-02 (×2): 1 [IU] via SUBCUTANEOUS
  Administered 2019-10-02: 5 [IU] via SUBCUTANEOUS

## 2019-09-26 NOTE — ED Notes (Signed)
Spoke with pt son. Son stating that he has possession of patients glasses and clothing at his house.

## 2019-09-26 NOTE — ED Notes (Signed)
ED TO INPATIENT HANDOFF REPORT  ED Nurse Name and Phone #: Marita Kansas [phone]  S Name/Age/Gender [name] 86 y.o. male Room/Bed: APA05/APA05  Code Status   Code Status: Prior  Home/SNF/Other Home Patient oriented to: place Is this baseline? Yes   Triage Complete: Triage complete  Chief Complaint Falls frequently [R29.6]  Triage Note Pt's son reports pt starting c/o SOB today and wanted to go to the hospital. Pt also reports bad headache. Son reports he has had about 17 falls in the last 2 weeks.     Allergies Allergies  Allergen Reactions   Sulfonamide Derivatives Rash   Tetracycline Rash    Level of Care/Admitting Diagnosis ED Disposition    ED Disposition Condition [facility]: [facility] [id number]  Level of Care: Telemetry [5]  Covid Evaluation: Confirmed COVID Negative  Diagnosis: Falls frequently [id number]  Admitting Physician: Bethena Roys [id number]  Attending Physician: Kara Pacer       B Medical/Surgery History Past Medical History:  Diagnosis Date   Aortic insufficiency    a. previously mild in 2012. b. trivial seen on echo in 05/2018.   Asbestos exposure    Bradycardia    MILD   CAD (coronary artery disease) 2003   a. s/p CABG in 1994. b. Cath 2003 patent grafts. c. Alvarez in 2020 felt anginal equivalent - cath 01/2019 s/p PCI/DES to the SVG-PDA.   Cancer [facility])    Skin   Carotid artery disease ([place]) 2001   BILATERAL 0-39%/  Follow with Dopplers by Dr. Woody Seller   Chronic combined systolic and diastolic CHF (congestive heart failure) ([place])    CKD (chronic kidney disease), stage III    Colitis    Diabetes mellitus    Dilated aortic root ([place]) 05/2018   Dizziness    He has had falls related to this / Chronic dizziness   Dyslipidemia    Hx of CABG    Hypertension    Nephrolithiasis    PVD (peripheral vascular disease) ([place])    Renal cyst    TIA (transient ischemic  attack) 2007   ?? medical Rx    Past Surgical History:  Procedure Laterality Date   CORONARY ARTERY BYPASS GRAFT     CORONARY STENT INTERVENTION N/A 02/15/2019   Procedure: CORONARY STENT INTERVENTION;  Surgeon: Jettie Booze, MD;  Location: [facility];  Service: Cardiovascular;  Laterality: N/A;  SEQ SVG PDA-PL   LEFT HEART CATH AND CORS/GRAFTS ANGIOGRAPHY N/A 02/15/2019   Procedure: LEFT HEART CATH AND CORS/GRAFTS ANGIOGRAPHY;  Surgeon: Jettie Booze, MD;  Location: [facility];  Service: Cardiovascular;  Laterality: N/A;   percutaneous transluminal coronary angioplasty     hx     A IV Location/Drains/Wounds Patient Lines/Drains/Airways Status   Active Line/Drains/Airways    Name:   Placement date:   Placement time:   Site:   Days:   Peripheral IV 09/26/19 Left Antecubital   09/26/19    1819    Antecubital   less than 1          Intake/Output Last 24 hours No intake or output data in the 24 hours ending 09/26/19 2228  Labs/Imaging Results for orders placed or performed during the hospital encounter of 09/26/19 (from the past 48 hour(s))  Basic metabolic panel     Status: Abnormal   Collection Time: 09/26/19  6:19 PM  Result Value Ref Range   Sodium 141 135 -  145 mmol/L   Potassium 3.8 3.5 - 5.1 mmol/L   Chloride 100 98 - 111 mmol/L   CO2 27 22 - 32 mmol/L   Glucose, Bld 114 (H) 70 - 99 mg/dL    Comment: Glucose reference range applies only to samples taken after fasting for at least 8 hours.   BUN 12 8 - 23 mg/dL   Creatinine, Ser 1.19 0.61 - 1.24 mg/dL   Calcium 8.9 8.9 - 10.3 mg/dL   GFR calc non Af Amer 53 (L) >60 mL/min   GFR calc Af Amer >60 >60 mL/min   Anion gap 14 5 - 15    Comment: Performed at [facility], [address] [place], [place] [address]  CBC with Differential     Status: Abnormal   Collection Time: 09/26/19  6:19 PM  Result Value Ref Range   WBC 10.5 4.0 - 10.5 K/uL   RBC 3.88 (L) 4.22 - 5.81 MIL/uL    Hemoglobin 11.9 (L) 13.0 - 17.0 g/dL   HCT 37.2 (L) 39.0 - 52.0 %   MCV 95.9 80.0 - 100.0 fL   MCH 30.7 26.0 - 34.0 pg   MCHC 32.0 30.0 - 36.0 g/dL   RDW 13.7 11.5 - 15.5 %   Platelets 313 150 - 400 K/uL   nRBC 0.0 0.0 - 0.2 %   Neutrophils Relative % 77 %   Neutro Abs 8.2 (H) 1.7 - 7.7 K/uL   Lymphocytes Relative 11 %   Lymphs Abs 1.2 0.7 - 4.0 K/uL   Monocytes Relative 7 %   Monocytes Absolute 0.7 0.1 - 1.0 K/uL   Eosinophils Relative 4 %   Eosinophils Absolute 0.4 0.0 - 0.5 K/uL   Basophils Relative 1 %   Basophils Absolute 0.1 0.0 - 0.1 K/uL   Immature Granulocytes 0 %   Abs Immature Granulocytes 0.03 0.00 - 0.07 K/uL    Comment: Performed at [facility], [address], [place], [place] [address]  Troponin I (High Sensitivity)     Status: None   Collection Time: 09/26/19  6:19 PM  Result Value Ref Range   Troponin I (High Sensitivity) 10 <18 ng/L    Comment: (NOTE) Elevated high sensitivity troponin I (hsTnI) values and significant  changes across serial measurements may suggest ACS but many other  chronic and acute conditions are known to elevate hsTnI results.  Refer to the "Links" section for chest pain algorithms and additional  guidance. Performed at [facility], [address] [place], [place] [address]   Brain natriuretic peptide     Status: Abnormal   Collection Time: 09/26/19  6:19 PM  Result Value Ref Range   B Natriuretic Peptide 146.0 (H) 0.0 - 100.0 pg/mL    Comment: Performed at [facility], [address] [place], [place] [address]  Urinalysis, Routine w reflex microscopic     Status: Abnormal   Collection Time: 09/26/19  6:36 PM  Result Value Ref Range   Color, Urine STRAW (A) YELLOW   APPearance CLEAR CLEAR   Specific Gravity, Urine 1.005 1.005 - 1.030   pH 5.0 5.0 - 8.0   Glucose, UA NEGATIVE NEGATIVE mg/dL   Hgb urine dipstick NEGATIVE NEGATIVE   Bilirubin Urine NEGATIVE NEGATIVE   Ketones, ur NEGATIVE NEGATIVE mg/dL   Protein, ur NEGATIVE  NEGATIVE mg/dL   Nitrite NEGATIVE NEGATIVE   Leukocytes,Ua NEGATIVE NEGATIVE    Comment: Performed at [facility], [address] [place],  [address]  SARS Coronavirus 2 by  RT PCR (hospital order, performed in [facility] hospital lab) Nasopharyngeal Nasopharyngeal Swab     Status: None   Collection Time: 09/26/19  6:36 PM   Specimen: Nasopharyngeal Swab  Result Value Ref Range   SARS Coronavirus 2 NEGATIVE NEGATIVE    Comment: (NOTE) SARS-CoV-2 target nucleic acids are NOT DETECTED. The SARS-CoV-2 RNA is generally detectable in upper and lower respiratory specimens during the acute phase of infection. The lowest concentration of SARS-CoV-2 viral copies this assay can detect is 250 copies / mL. A negative result does not preclude SARS-CoV-2 infection and should not be used as the sole basis for treatment or other patient management decisions.  A negative result may occur with improper specimen collection / handling, submission of specimen other than nasopharyngeal swab, presence of viral mutation(s) within the areas targeted by this assay, and inadequate number of viral copies (<250 copies / mL). A negative result must be combined with clinical observations, patient history, and epidemiological information. Fact Sheet for Patients:   StrictlyIdeas.no Fact Sheet for Healthcare Providers: BankingDealers.co.za This test is not yet approved or cleared  by the [place] FDA and has been authorized for detection and/or diagnosis of SARS-CoV-2 by FDA under an Emergency Use Authorization (EUA).  This EUA will remain in effect (meaning this test can be used) for the duration of the COVID-19 declaration under Section 564(b)(1) of the Act, 21 U.S.C. section 360bbb-3(b)(1), unless the authorization is terminated or revoked sooner. Performed at [facility], [address] [place], [place] [address]   Troponin I (High Sensitivity)      Status: None   Collection Time: 09/26/19  8:24 PM  Result Value Ref Range   Troponin I (High Sensitivity) 11 <18 ng/L    Comment: (NOTE) Elevated high sensitivity troponin I (hsTnI) values and significant  changes across serial measurements may suggest ACS but many other  chronic and acute conditions are known to elevate hsTnI results.  Refer to the "Links" section for chest pain algorithms and additional  guidance. Performed at [facility], [address], [place], [place] [address]    DG Chest 2 View  Result Date: 09/26/2019 CLINICAL DATA:  Shortness of breath headache EXAM: CHEST - 2 VIEW COMPARISON:  04/16/2019, CT 07/09/2019, chest x-ray 09/24/2016 FINDINGS: Post sternotomy changes. No significant pleural effusion. Streaky airspace disease at the left base. Stable cardiomediastinal silhouette. No pneumothorax. Surgical hardware in the proximal left humerus. IMPRESSION: Streaky left basilar opacity, favor atelectasis. Electronically Signed   By: Donavan Foil M.D.   On: 09/26/2019 18:18    Pending Labs Unresulted Labs (From admission, onward)   None      Vitals/Pain Today's Vitals   09/26/19 1722 09/26/19 1817 09/26/19 2037 09/26/19 2141  BP:  (!) 152/68 132/72 (!) 150/86  Pulse:  75 69 81  Resp:  17 18 18   Temp:   97.8 F (36.6 C) 97.8 F (36.6 C)  TempSrc:   Oral Oral  SpO2:  98% 97% 98%  Weight: 86.8 kg     Height: 5\' 11"  (1.803 m)     PainSc:  3       Isolation Precautions No active isolations  Medications Medications  sodium chloride 0.9 % bolus 1,000 mL (1,000 mLs Intravenous New Bag/Given 09/26/19 2140)    Mobility walks with device High fall risk   Focused Assessments    R Recommendations: See Admitting Provider Note  Report given to:   Additional Notes: Pt has external catheter.

## 2019-09-26 NOTE — ED Triage Notes (Signed)
Pt's son reports pt starting c/o SOB today and wanted to go to the hospital. Pt also reports bad headache. Son reports he has had about 17 falls in the last 2 weeks.

## 2019-09-26 NOTE — H&P (Signed)
History and Physical    [name] [id number] DOB: [birth year] DOA: 09/26/2019  PCP: Glenda Chroman, MD   Patient coming from: Home  I have personally briefly reviewed patient's old medical records in [place]  Chief Complaint: Falls, SOB, dizziness, diarrhea  HPI: [name] is a 86 y.o. male with medical history significant for systolic and diastolic CHF, CABG, aortic insufficiency mitral regurgitation, hypertension, diabetes mellitus. Patient was brought to the ED with reports of multiple falls in the past few weeks.  Per triage notes, 17 falls in the past 2 weeks.  Patient reports dizziness when standing up, generalized weakness also.  He tells me today he started having difficulty breathing with activity. Reports he has been having multiple loose stools for several weeks, but reports just 1 loose stool yesterday.  No vomiting, no abdominal pain.  No chest pain.  No cough.  Swelling in his lower extremities.  Reports poor p.o. intake.  ED Course: Stable vitals.  O2 sats greater than 97% on room air.  WBC 10.5.  Creatinine 1.19.  Troponin 10.  UA unremarkable.  Covid test negative.  Two-view chest x-ray-  streaky left basilar opacity favors atelectasis.  1 L bolus given.  Hospitalist to admit for multiple falls, dizziness and diarrhea.  Review of Systems: As per HPI all other systems reviewed and negative.  Past Medical History:  Diagnosis Date  . Aortic insufficiency    a. previously mild in 2012. b. trivial seen on echo in 05/2018.  . Asbestos exposure   . Bradycardia    MILD  . CAD (coronary artery disease) 2003   a. s/p CABG in 1994. b. Cath 2003 patent grafts. c. DOE in 2020 felt anginal equivalent - cath 01/2019 s/p PCI/DES to the SVG-PDA.  . Cancer ([place])    Skin  . Carotid artery disease ([place]) 2001   BILATERAL 0-39%/  Follow with Dopplers by Dr. Woody Seller  . Chronic combined systolic and diastolic CHF (congestive heart failure) ([place])   . CKD (chronic kidney  disease), stage III   . Colitis   . Diabetes mellitus   . Dilated aortic root ([place]) 05/2018  . Dizziness    He has had falls related to this / Chronic dizziness  . Dyslipidemia   . Hx of CABG   . Hypertension   . Nephrolithiasis   . PVD (peripheral vascular disease) ([place])   . Renal cyst   . TIA (transient ischemic attack) 2007   ?? medical Rx     Past Surgical History:  Procedure Laterality Date  . CORONARY ARTERY BYPASS GRAFT    . CORONARY STENT INTERVENTION N/A 02/15/2019   Procedure: CORONARY STENT INTERVENTION;  Surgeon: Jettie Booze, MD;  Location: [facility];  Service: Cardiovascular;  Laterality: N/A;  SEQ SVG PDA-PL  . LEFT HEART CATH AND CORS/GRAFTS ANGIOGRAPHY N/A 02/15/2019   Procedure: LEFT HEART CATH AND CORS/GRAFTS ANGIOGRAPHY;  Surgeon: Jettie Booze, MD;  Location: [facility];  Service: Cardiovascular;  Laterality: N/A;  . percutaneous transluminal coronary angioplasty     hx     reports that he quit smoking about 39 years ago. His smoking use included cigarettes. He started smoking about 72 years ago. He has a 60.00 pack-year smoking history. He has never used smokeless tobacco. He reports that he does not drink alcohol or use drugs.  Allergies  Allergen Reactions  . Sulfonamide Derivatives Rash  . Tetracycline Rash    Family History  Problem Relation Age of Onset  . Heart attack Mother 24    Prior to Admission medications   Medication Sig Start Date End Date Taking? Authorizing Provider  aspirin EC 81 MG tablet Take 1 tablet (81 mg total) by mouth daily. 05/22/12   Carlena Bjornstad, MD  atorvastatin (LIPITOR) 20 MG tablet Take 20 mg by mouth daily. 10/26/17   [provider]  cetirizine (ZYRTEC) 10 MG tablet Take 1 tablet by mouth daily as needed for allergies.  10/26/17   [provider]  citalopram (CELEXA) 40 MG tablet Take 0.5 tablets (20 mg total) by mouth daily. Please cut your existing tablets with a pill  cutter. If you have any difficulty, let your primary care doctor know. 02/16/19   Dunn, Nedra Hai, PA-C  clopidogrel (PLAVIX) 75 MG tablet Take 1 tablet (75 mg total) by mouth daily. 02/21/19   Strader, Fransisco Hertz, PA-C  diazepam (VALIUM) 5 MG tablet Take 5 mg by mouth at bedtime as needed. For nerves     [provider]  furosemide (LASIX) 40 MG tablet Take 1 tablet (40 mg total) by mouth 2 (two) times daily. 06/11/19   Herminio Commons, MD  metFORMIN (GLUCOPHAGE) 500 MG tablet Take 500 mg by mouth at bedtime.     [provider]  metoprolol succinate (TOPROL-XL) 25 MG 24 hr tablet Take 1 tablet by mouth once daily 09/10/19   Herminio Commons, MD  Multiple Vitamin (MULTIVITAMIN) tablet Take 1 tablet by mouth daily.      [provider]  nitroGLYCERIN (NITROSTAT) 0.4 MG SL tablet Place 1 tablet (0.4 mg total) under the tongue every 5 (five) minutes as needed for chest pain. 04/14/17 01/24/20  Herminio Commons, MD  potassium chloride SA (KLOR-CON) 20 MEQ tablet Take 1 tablet (20 mEq total) by mouth daily. 06/11/19   Herminio Commons, MD  rOPINIRole (REQUIP) 1 MG tablet Take 1 mg by mouth 2 (two) times daily. Take 1 tab in AM and 2 PM 11/16/12   [provider]    Physical Exam: Vitals:   09/26/19 1722 09/26/19 1817 09/26/19 2037 09/26/19 2141  BP:  (!) 152/68 132/72 (!) 150/86  Pulse:  75 69 81  Resp:  17 18 18   Temp:   97.8 F (36.6 C) 97.8 F (36.6 C)  TempSrc:   Oral Oral  SpO2:  98% 97% 98%  Weight: 86.8 kg     Height: 5\' 11"  (1.803 m)       Constitutional:  NAD, calm, comfortable Vitals:   09/26/19 1722 09/26/19 1817 09/26/19 2037 09/26/19 2141  BP:  (!) 152/68 132/72 (!) 150/86  Pulse:  75 69 81  Resp:  17 18 18   Temp:   97.8 F (36.6 C) 97.8 F (36.6 C)  TempSrc:   Oral Oral  SpO2:  98% 97% 98%  Weight: 86.8 kg     Height: 5\' 11"  (1.803 m)      Eyes: PERRL, lids and conjunctivae normal ENMT: Mucous membranes are dry  Neck:  normal, supple, no masses, no thyromegaly Respiratory: clear to auscultation bilaterally, no wheezing, no crackles. Normal respiratory effort. No accessory muscle use.  Cardiovascular: Regular rate and rhythm, no murmurs / rubs / gallops. No extremity edema. 2+ pedal pulses.   Abdomen: no tenderness, no masses palpated. No hepatosplenomegaly.   Musculoskeletal: no clubbing / cyanosis. No joint deformity upper and lower extremities. Good ROM, no contractures.  Skin: no rashes, lesions, ulcers. No induration  Neurologic: No apparent cranial nerve abnormality, 4+5 strength in all extremities,.  Psychiatric: Normal judgment and insight. Alert and oriented x 3. Normal mood.   Labs on Admission: I have personally reviewed following labs and imaging studies  CBC: Recent Labs  Lab 09/26/19 1819  WBC 10.5  NEUTROABS 8.2*  HGB 11.9*  HCT 37.2*  MCV 95.9  PLT [id number]   Basic Metabolic Panel: Recent Labs  Lab 09/26/19 1819  NA 141  K 3.8  CL 100  CO2 27  GLUCOSE 114*  BUN 12  CREATININE 1.19  CALCIUM 8.9   Urine analysis:    Component Value Date/Time   COLORURINE STRAW (A) 09/26/2019 1836   APPEARANCEUR CLEAR 09/26/2019 1836   LABSPEC 1.005 09/26/2019 1836   PHURINE 5.0 09/26/2019 1836   GLUCOSEU NEGATIVE 09/26/2019 1836   HGBUR NEGATIVE 09/26/2019 1836   BILIRUBINUR NEGATIVE 09/26/2019 1836   KETONESUR NEGATIVE 09/26/2019 1836   PROTEINUR NEGATIVE 09/26/2019 1836   NITRITE NEGATIVE 09/26/2019 1836   LEUKOCYTESUR NEGATIVE 09/26/2019 1836    Radiological Exams on Admission: DG Chest 2 View  Result Date: 09/26/2019 CLINICAL DATA:  Shortness of breath headache EXAM: CHEST - 2 VIEW COMPARISON:  04/16/2019, CT 07/09/2019, chest x-ray 09/24/2016 FINDINGS: Post sternotomy changes. No significant pleural effusion. Streaky airspace disease at the left base. Stable cardiomediastinal silhouette. No pneumothorax. Surgical hardware in the proximal left humerus. IMPRESSION: Streaky left basilar  opacity, favor atelectasis. Electronically Signed   By: Donavan Foil M.D.   On: 09/26/2019 18:18    EKG: Independently reviewed.  Sinus rhythm, QTC 452.  No Significant ST or T wave changes compared to prior EKG.  Assessment/Plan Active Problems:   Falls frequently   Frequent falls- likely due to advanced age, deconditioning, dizziness from chronic diarrhea, poor p.o. intake, he appears mildly dehydrated.    -1 L bolus given, further IV fluids held for now, as his renal function and blood pressure is stable. -PT evaluation -CT head  Diarrhea-appears mostly chronic.  Abdominal exam is benign, afebrile without leukocytosis.  -Stool for C. Difficile -Hydrate  Systolic and diastolic CHF-  Stable and compensated.  Last echo 05/2018 EF 35 to 40%, impaired LV diastolic parameters.  Reports dyspnea on exertion-which is likely from deconditioning also, 2 view chest x-ray suggests atelectasis, presently does not appear dyspneic, he is not tachycardic stable vitals.  Dyspnea appears to be an intermittent but chronic issue, documented by his cardiologist also. -1 L bolus given. -Hold home Lasix 40 mg twice daily,  Hypertension-stable -Resume home metoprolol.  History of CABG, PCI - no chest pain, EKG unchanged.  Troponin 10 > 11.  CABG 1994, then underwent PCI 01/2019 -Resume aspirin, statins, Plavix.  Diabetes mellitus-random glucose 114. - SSI- s -Hold home Metformin  Restless leg syndrome -Resume ropinirole  DVT prophylaxis: Lovenox Code Status: Full code Family Communication: None at bedside Disposition Plan: 1 to 2 days. Consults called: None Admission status: Observation, telemetry   Bethena Roys MD [facility]  09/26/2019, 10:44 PM

## 2019-09-26 NOTE — ED Provider Notes (Signed)
[facility] EMERGENCY DEPARTMENT Provider Note   CSN: [id number] Arrival date & time: 09/26/19  1704     History Chief Complaint  Patient presents with  . Shortness of Breath    [name] is a 86 y.o. male.  HPI Patient lives with his daughter, brought in by his son. Patient reports he has been feeling poorly for the last few days with poor appetite, dry mouth, multiple episodes of diarrhea, dizziness with standing and falling in his house. Today he also began having SOB and 'felt like he was dying' and so his son brought him to the ED. He denies any fevers, mild cough. No chest pain. No abdominal pain, nausea or vomiting.     Past Medical History:  Diagnosis Date  . Aortic insufficiency    a. previously mild in 2012. b. trivial seen on echo in 05/2018.  . Asbestos exposure   . Bradycardia    MILD  . CAD (coronary artery disease) 2003   a. s/p CABG in 1994. b. Cath 2003 patent grafts. c. DOE in 2020 felt anginal equivalent - cath 01/2019 s/p PCI/DES to the SVG-PDA.  . Cancer ([place])    Skin  . Carotid artery disease ([place]) 2001   BILATERAL 0-39%/  Follow with Dopplers by Dr. Woody Seller  . Chronic combined systolic and diastolic CHF (congestive heart failure) ([place])   . CKD (chronic kidney disease), stage III   . Colitis   . Diabetes mellitus   . Dilated aortic root ([place]) 05/2018  . Dizziness    He has had falls related to this / Chronic dizziness  . Dyslipidemia   . Hx of CABG   . Hypertension   . Nephrolithiasis   . PVD (peripheral vascular disease) ([place])   . Renal cyst   . TIA (transient ischemic attack) 2007   ?? medical Rx     Patient Active Problem List   Diagnosis Date Noted  . Falls frequently 09/26/2019  . CKD (chronic kidney disease), stage III 02/16/2019  . DOE (dyspnea on exertion) 02/15/2019  . Mitral regurgitation   . Aortic insufficiency   . Right knee dislocation 05/26/2011  . Knee dislocation 04/03/2011  . Carotid artery disease ([place])   .  Ejection fraction   . Shortness of breath   . Dizziness   . Hx of CABG   . Dyslipidemia   . Hypertension   . CAD (coronary artery disease) of artery bypass graft   . TIA (transient ischemic attack)   . Bradycardia   . Diabetes mellitus type 2 in nonobese ([place]) 12/20/2008  . COLITIS, HX OF 12/20/2008  . NEPHROLITHIASIS, HX OF 12/20/2008  . ASBESTOS EXPOSURE, HX OF 12/20/2008    Past Surgical History:  Procedure Laterality Date  . CORONARY ARTERY BYPASS GRAFT    . CORONARY STENT INTERVENTION N/A 02/15/2019   Procedure: CORONARY STENT INTERVENTION;  Surgeon: Jettie Booze, MD;  Location: [facility];  Service: Cardiovascular;  Laterality: N/A;  SEQ SVG PDA-PL  . LEFT HEART CATH AND CORS/GRAFTS ANGIOGRAPHY N/A 02/15/2019   Procedure: LEFT HEART CATH AND CORS/GRAFTS ANGIOGRAPHY;  Surgeon: Jettie Booze, MD;  Location: [facility];  Service: Cardiovascular;  Laterality: N/A;  . percutaneous transluminal coronary angioplasty     hx       Family History  Problem Relation Age of Onset  . Heart attack Mother 17    Social History   Tobacco Use  . Smoking status: Former Smoker  Packs/day: 2.00    Years: 30.00    Pack years: 60.00    Types: Cigarettes    Start date: 12/[birth year]/1946    Quit date: 04/26/1980    Years since quitting: 39.4  . Smokeless tobacco: Never Used  Substance Use Topics  . Alcohol use: Never    Alcohol/week: 0.0 standard drinks  . Drug use: Never    Home Medications Prior to Admission medications   Medication Sig Start Date End Date Taking? Authorizing Provider  aspirin EC 81 MG tablet Take 1 tablet (81 mg total) by mouth daily. 05/22/12   Carlena Bjornstad, MD  atorvastatin (LIPITOR) 20 MG tablet Take 20 mg by mouth daily. 10/26/17   [provider]  cetirizine (ZYRTEC) 10 MG tablet Take 1 tablet by mouth daily as needed for allergies.  10/26/17   [provider]  citalopram (CELEXA) 40 MG tablet Take 0.5 tablets (20 mg  total) by mouth daily. Please cut your existing tablets with a pill cutter. If you have any difficulty, let your primary care doctor know. 02/16/19   Dunn, Nedra Hai, PA-C  clopidogrel (PLAVIX) 75 MG tablet Take 1 tablet (75 mg total) by mouth daily. 02/21/19   Strader, Fransisco Hertz, PA-C  diazepam (VALIUM) 5 MG tablet Take 5 mg by mouth at bedtime as needed. For nerves     [provider]  furosemide (LASIX) 40 MG tablet Take 1 tablet (40 mg total) by mouth 2 (two) times daily. 06/11/19   Herminio Commons, MD  metFORMIN (GLUCOPHAGE) 500 MG tablet Take 500 mg by mouth at bedtime.     [provider]  metoprolol succinate (TOPROL-XL) 25 MG 24 hr tablet Take 1 tablet by mouth once daily 09/10/19   Herminio Commons, MD  Multiple Vitamin (MULTIVITAMIN) tablet Take 1 tablet by mouth daily.      [provider]  nitroGLYCERIN (NITROSTAT) 0.4 MG SL tablet Place 1 tablet (0.4 mg total) under the tongue every 5 (five) minutes as needed for chest pain. 04/14/17 01/24/20  Herminio Commons, MD  potassium chloride SA (KLOR-CON) 20 MEQ tablet Take 1 tablet (20 mEq total) by mouth daily. 06/11/19   Herminio Commons, MD  rOPINIRole (REQUIP) 1 MG tablet Take 1 mg by mouth 2 (two) times daily. Take 1 tab in AM and 2 PM 11/16/12   [provider]    Allergies    Sulfonamide derivatives and Tetracycline  Review of Systems   Review of Systems A comprehensive review of systems was completed and negative except as noted in HPI.   Physical Exam Updated Vital Signs BP 132/72 (BP Location: Right Arm)   Pulse 69   Temp 97.8 F (36.6 C) (Oral)   Resp 18   Ht 5\' 11"  (1.803 m)   Wt 86.8 kg   SpO2 97%   BMI 26.69 kg/m   Physical Exam Vitals and nursing note reviewed.  Constitutional:      Appearance: Normal appearance.  HENT:     Head: Normocephalic and atraumatic.     Nose: Nose normal.     Mouth/Throat:     Mouth: Mucous membranes are dry.  Eyes:      Extraocular Movements: Extraocular movements intact.     Conjunctiva/sclera: Conjunctivae normal.  Cardiovascular:     Rate and Rhythm: Normal rate.  Pulmonary:     Effort: Pulmonary effort is normal.     Breath sounds: Normal breath sounds.  Abdominal:     General: Abdomen  is flat.     Palpations: Abdomen is soft.     Tenderness: There is no abdominal tenderness.  Musculoskeletal:        General: No swelling. Normal range of motion.     Cervical back: Neck supple.  Skin:    General: Skin is warm and dry.  Neurological:     General: No focal deficit present.     Mental Status: He is alert.  Psychiatric:        Mood and Affect: Mood normal.     ED Results / Procedures / Treatments   Labs (all labs ordered are listed, but only abnormal results are displayed) Labs Reviewed  BASIC METABOLIC PANEL - Abnormal; Notable for the following components:      Result Value   Glucose, Bld 114 (*)    GFR calc non Af Amer 53 (*)    All other components within normal limits  CBC WITH DIFFERENTIAL/PLATELET - Abnormal; Notable for the following components:   RBC 3.88 (*)    Hemoglobin 11.9 (*)    HCT 37.2 (*)    Neutro Abs 8.2 (*)    All other components within normal limits  BRAIN NATRIURETIC PEPTIDE - Abnormal; Notable for the following components:   B Natriuretic Peptide 146.0 (*)    All other components within normal limits  URINALYSIS, ROUTINE W REFLEX MICROSCOPIC - Abnormal; Notable for the following components:   Color, Urine STRAW (*)    All other components within normal limits  SARS CORONAVIRUS 2 BY RT PCR (HOSPITAL ORDER, [place] [facility])  TROPONIN I (HIGH SENSITIVITY)  TROPONIN I (HIGH SENSITIVITY)    EKG EKG Interpretation  Date/Time:  Wednesday September 26 2019 17:22:23 EDT Ventricular Rate:  92 PR Interval:  206 QRS Duration: 126 QT Interval:  366 QTC Calculation: 452 R Axis:     Text Interpretation: Normal sinus rhythm Non-specific  intra-ventricular conduction block Minimal voltage criteria for LVH, may be normal variant ( Cornell product ) Cannot rule out Inferior infarct , age undetermined Cannot rule out Anterior infarct , age undetermined Abnormal ECG No significant change since last tracing Confirmed by Calvert Cantor [phone]) on 09/26/2019 6:25:57 PM   Radiology DG Chest 2 View  Result Date: 09/26/2019 CLINICAL DATA:  Shortness of breath headache EXAM: CHEST - 2 VIEW COMPARISON:  04/16/2019, CT 07/09/2019, chest x-ray 09/24/2016 FINDINGS: Post sternotomy changes. No significant pleural effusion. Streaky airspace disease at the left base. Stable cardiomediastinal silhouette. No pneumothorax. Surgical hardware in the proximal left humerus. IMPRESSION: Streaky left basilar opacity, favor atelectasis. Electronically Signed   By: Donavan Foil M.D.   On: 09/26/2019 18:18    Procedures Procedures (including critical care time)  Medications Ordered in ED Medications  sodium chloride 0.9 % bolus 1,000 mL (has no administration in time range)    ED Course  I have reviewed the triage vital signs and the nursing notes.  Pertinent labs & imaging results that were available during my care of the patient were reviewed by me and considered in my medical decision making (see chart for details).  Clinical Course as of Sep 25 2128  Wed Sep 26, 2019  2100 Patient's labs reviewed, no concerning findings, but he states he feels poorly. No signs of CHF causing his SOB so will give IVF for possible dehydration and discuss admission with Hospitalist.    [CS]  2129 Spoke with Dr. Arlyce Dice who will evaluate for admission.    [CS]  Clinical Course User Index [CS] Truddie Hidden, MD   MDM Rules/Calculators/A&P                      Patient with signs of dehydration, reports diarrhea and decreased fluid intake. BP in the ED is normal so will hold off on IVF pending CXR given SOB and history of CAD.  Final Clinical  Impression(s) / ED Diagnoses Final diagnoses:  SOB (shortness of breath)  Diarrhea, unspecified type    Rx / DC Orders ED Discharge Orders    None       Truddie Hidden, MD 09/26/19 2130

## 2019-09-27 DIAGNOSIS — R296 Repeated falls: Secondary | ICD-10-CM | POA: Diagnosis not present

## 2019-09-27 LAB — GLUCOSE, CAPILLARY
Glucose-Capillary: 109 mg/dL — ABNORMAL HIGH (ref 70–99)
Glucose-Capillary: 137 mg/dL — ABNORMAL HIGH (ref 70–99)
Glucose-Capillary: 117 mg/dL — ABNORMAL HIGH (ref 70–99)
Glucose-Capillary: 132 mg/dL — ABNORMAL HIGH (ref 70–99)

## 2019-09-27 MED ORDER — HALOPERIDOL LACTATE 5 MG/ML IJ SOLN
5.0000 mg | Freq: Four times a day (QID) | INTRAMUSCULAR | Status: DC | PRN
  Administered 2019-09-27 (×2): 5 mg via INTRAVENOUS
  Filled 2019-09-27 (×2): qty 1

## 2019-09-27 MED ORDER — MELATONIN 3 MG PO TABS
9.0000 mg | ORAL_TABLET | Freq: Once | ORAL | Status: AC
  Administered 2019-09-27: 9 mg via ORAL
  Filled 2019-09-27: qty 3

## 2019-09-27 MED ORDER — DIAZEPAM 5 MG PO TABS
5.0000 mg | ORAL_TABLET | Freq: Once | ORAL | Status: AC
  Administered 2019-09-28: 5 mg via ORAL
  Filled 2019-09-27: qty 1

## 2019-09-27 MED ORDER — FUROSEMIDE 40 MG PO TABS
40.0000 mg | ORAL_TABLET | Freq: Every day | ORAL | Status: DC
  Administered 2019-09-28 – 2019-09-29 (×2): 40 mg via ORAL
  Filled 2019-09-27 (×2): qty 1

## 2019-09-27 NOTE — Evaluation (Signed)
Physical Therapy Evaluation Patient Details Name: [name] MRN: [id number] DOB: 1926/12/09 Today's Date: 09/27/2019   History of Present Illness  [name] is a 86 y.o. male with medical history significant for systolic and diastolic CHF, CABG, aortic insufficiency mitral regurgitation, hypertension, diabetes mellitus.Patient was brought to the ED with reports of multiple falls in the past few weeks.  Per triage notes, 17 falls in the past 2 weeks.  Patient reports dizziness when standing up, generalized weakness also.  He tells me today he started having difficulty breathing with activity.Reports he has been having multiple loose stools for several weeks, but reports just 1 loose stool yesterday.  No vomiting, no abdominal pain.  No chest pain.  No cough.  Swelling in his lower extremities.  Reports poor p.o. intake.    Clinical Impression  Patient limited for functional mobility as stated below secondary to BLE weakness, fatigue and poor standing balance.  Patient ambulated in hallway with slow labored cadence without loss of balance and tolerated sitting up in chair after therapy - RN notified.  Patient will benefit from continued physical therapy in hospital and recommended venue below to increase strength, balance, endurance for safe ADLs and gait.     Follow Up Recommendations SNF;Supervision for mobility/OOB;Supervision - Intermittent    Equipment Recommendations  None recommended by PT    Recommendations for Other Services       Precautions / Restrictions Precautions Precautions: Fall Restrictions Weight Bearing Restrictions: No      Mobility  Bed Mobility Overal bed mobility: Needs Assistance Bed Mobility: Supine to Sit     Supine to sit: Supervision;Min guard     General bed mobility comments: increased time, labored movement  Transfers Overall transfer level: Needs assistance Equipment used: Rolling walker (2 wheeled) Transfers: Sit to/from  [facility] Sit to Stand: Min guard;Min assist Stand pivot transfers: Min assist       General transfer comment: increased time, labored movement  Ambulation/Gait Ambulation/Gait assistance: Min assist Gait Distance (Feet): 40 Feet Assistive device: Rolling walker (2 wheeled) Gait Pattern/deviations: Decreased step length - right;Decreased step length - left;Decreased stride length Gait velocity: decreased   General Gait Details: slow slightly labored cadence without loss of balance, limited secondary to c/o fatigue  Stairs            Wheelchair Mobility    Modified Rankin (Stroke Patients Only)       Balance Overall balance assessment: Needs assistance Sitting-balance support: Feet supported;No upper extremity supported Sitting balance-Leahy Scale: Good Sitting balance - Comments: seated at EOB   Standing balance support: During functional activity;Bilateral upper extremity supported Standing balance-Leahy Scale: Fair Standing balance comment: using RW                             Pertinent Vitals/Pain Pain Assessment: No/denies pain    Home Living Family/patient expects to be discharged to:: Private residence Living Arrangements: Children Available Help at Discharge: Family;Available 24 hours/day Type of Home: House Home Access: Ramped entrance     Home Layout: Two level;Able to live on main level with bedroom/bathroom;Laundry or work area in [place]: Wheelchair - [employer] - 2 wheels;Shower seat - built in;Grab bars - tub/shower;Hand held shower head;Bedside commode;Grab bars - toilet      Prior Function Level of Independence: Independent with assistive device(s)         Comments: household ambulator with RW  Hand Dominance        Extremity/Trunk Assessment   Upper Extremity Assessment Upper Extremity Assessment: Generalized weakness    Lower Extremity Assessment Lower Extremity  Assessment: Generalized weakness    Cervical / Trunk Assessment Cervical / Trunk Assessment: Normal  Communication   Communication: No difficulties  Cognition Arousal/Alertness: Awake/alert Behavior During Therapy: WFL for tasks assessed/performed Overall Cognitive Status: Within Functional Limits for tasks assessed                                        General Comments      Exercises     Assessment/Plan    PT Assessment Patient needs continued PT services  PT Problem List Decreased strength;Decreased activity tolerance;Decreased balance;Decreased mobility       PT Treatment Interventions Gait training;Stair training;Functional mobility training;Therapeutic activities;Therapeutic exercise;Patient/family education    PT Goals (Current goals can be found in the Care Plan section)  Acute Rehab PT Goals Patient Stated Goal: return home with family to assist PT Goal Formulation: With patient Time For Goal Achievement: 10/11/19 Potential to Achieve Goals: Good    Frequency Min 3X/week   Barriers to discharge        Co-evaluation               AM-PAC PT "6 Clicks" Mobility  Outcome Measure Help needed turning from your back to your side while in a flat bed without using bedrails?: None Help needed moving from lying on your back to sitting on the side of a flat bed without using bedrails?: A Little Help needed moving to and from a bed to a chair (including a wheelchair)?: A Little Help needed standing up from a chair using your arms (e.g., wheelchair or bedside chair)?: A Little Help needed to walk in hospital room?: A Little Help needed climbing 3-5 steps with a railing? : A Lot 6 Click Score: 18    End of Session   Activity Tolerance: Patient tolerated treatment well;Patient limited by fatigue Patient left: in chair;with call bell/phone within reach;with chair alarm set Nurse Communication: Mobility status PT Visit Diagnosis: Unsteadiness on  feet (R26.81);Other abnormalities of gait and mobility (R26.89);Muscle weakness (generalized) (M62.81)    Time: [id number] PT Time Calculation (min) (ACUTE ONLY): 33 min   Charges:   PT Evaluation $PT Eval Moderate Complexity: 1 Mod PT Treatments $Therapeutic Activity: 23-37 mins        3:06 PM, 09/27/19 Lonell Grandchild, MPT Physical Therapist with [facility] [phone] office [phone] mobile phone
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
## 2019-09-27 NOTE — TOC Initial Note (Signed)
Transition of Care [facility] - Initial/Assessment Note    Patient Details  Name: [name] MRN: [id number] Date of Birth: [birth year]  Transition of [facility]) CM/SW Contact:    Jesse Arnt, LCSW Phone Number: 09/27/2019, 9:44 AM  Clinical Narrative:  Pt from home with daughter. LCSW spoke with pt's daughter, [name] who lives with pt and is primary caregiver. [name] reports she is unable to care for pt at home due to frequent falls and pt not cooperating with her. PT evaluated pt and recommend SNF. [name] states they have tried to place pt from home but have not been successful. LCSW discussed Medicare criteria of 3 night stay, but per [facility] this is currently waived. [name] indicates they can pay privately if needed at some point. She is open to any facility in [facility]. Pt was in soft restraints until this morning- unable to go to SNF until 24 hour restraint free. Will follow up with family with bed offers.               Expected Discharge Plan: Skilled Nursing Facility Barriers to Discharge: Facility will not accept until restraint criteria met   Patient Goals and CMS Choice Patient states their goals for this hospitalization and ongoing recovery are:: SNF CMS Medicare.gov Compare Post Acute Care list provided to:: Patient Choice offered to / list presented to : Patient  Expected Discharge Plan and Services Expected Discharge Plan: [place] In-house Referral: Clinical Social Work   Post Acute Care Choice: [place] Living arrangements for the past 2 months: [place]                                      Prior Living Arrangements/Services Living arrangements for the past 2 months: Single Family Home Lives with:: Adult Children Patient language and need for interpreter reviewed:: Yes Do you feel safe going back to the place where you live?: No   Daughter reports pt needs placement.  Need for Family Participation  in Patient Care: Yes (Comment) Care giver support system in place?: Yes (comment) Current home services: DME(Wheelchair/walker) Criminal Activity/Legal Involvement Pertinent to Current Situation/Hospitalization: No - Comment as needed  Activities of Daily Living Home Assistive Devices/Equipment: None ADL Screening (condition at time of admission) Patient's cognitive ability adequate to safely complete daily activities?: Yes Is the patient deaf or have difficulty hearing?: Yes Does the patient have difficulty seeing, even when wearing glasses/contacts?: No Does the patient have difficulty concentrating, remembering, or making decisions?: Yes Patient able to express need for assistance with ADLs?: Yes Does the patient have difficulty dressing or bathing?: No Independently performs ADLs?: Yes (appropriate for developmental age) Does the patient have difficulty walking or climbing stairs?: Yes Weakness of Legs: Both Weakness of Arms/Hands: None  Permission Sought/Granted                  Emotional Assessment   Attitude/Demeanor/Rapport: Unable to Assess Affect (typically observed): Unable to Assess Orientation: : Oriented to Self, Oriented to Place, Oriented to Situation Alcohol / Substance Use: Not Applicable Psych Involvement: No (comment)  Admission diagnosis:  SOB (shortness of breath) [R06.02] Falls frequently [R29.6] Diarrhea, unspecified type [R19.7] Patient Active Problem List   Diagnosis Date Noted  . Falls frequently 09/26/2019  . CKD (chronic kidney disease), stage III 02/16/2019  . DOE (dyspnea on exertion) 02/15/2019  . Mitral regurgitation   .  Aortic insufficiency   . Right knee dislocation 05/26/2011  . Knee dislocation 04/03/2011  . Carotid artery disease ([place])   . Ejection fraction   . Shortness of breath   . Dizziness   . Hx of CABG   . Dyslipidemia   . Hypertension   . CAD (coronary artery disease) of artery bypass graft   . TIA (transient  ischemic attack)   . Bradycardia   . Diabetes mellitus type 2 in nonobese ([place]) 12/20/2008  . COLITIS, HX OF 12/20/2008  . NEPHROLITHIASIS, HX OF 12/20/2008  . ASBESTOS EXPOSURE, HX OF 12/20/2008   PCP:  Jesse Chroman, MD Pharmacy:   [facility] [place] [address] [place] [address] Phone: [phone] Fax: [phone]  [facility] [place] [place] - [address] [place] [address] Phone: [phone] Fax: [phone]  [facility] [address], [place] [address] Phone: [phone] Fax: [phone]     Social Determinants of Health (SDOH) Interventions    Readmission Risk Interventions No flowsheet data found.

## 2019-09-27 NOTE — TOC Progression Note (Signed)
Transition of Care [facility]) - Progression Note    Patient Details  Name: [name] MRN: [id number] Date of Birth: 24-May-1926  Transition of [facility]) CM/SW Contact  Ihor Gully, LCSW Phone Number: 09/27/2019, 5:24 PM  Clinical Narrative:    Daughter, [name] is agreeable to SNF at [facility]. Would like to see if [facility] makes bed offer. If they do not, she is agreeable to [place].   Expected Discharge Plan: Skilled Nursing Facility Barriers to Discharge: Facility will not accept until restraint criteria met  Expected Discharge Plan and Services Expected Discharge Plan: [place] In-house Referral: Clinical Social Work   Post Acute Care Choice: [place] Living arrangements for the past 2 months: Single Family Home Expected Discharge Date: 09/27/19                                     Social Determinants of Health (SDOH) Interventions    Readmission Risk Interventions No flowsheet data found.

## 2019-09-27 NOTE — NC FL2 (Addendum)
[place] LEVEL OF CARE SCREENING TOOL     IDENTIFICATION  Patient Name: [name] Birthdate: [birth year] Sex: male Admission Date (Current Location): 09/26/2019  [facility] [place] and [place] Number:  [employer] and Address:  [place] [address] [place]      Provider Number: [id number]  Attending Physician Name and Address:  Edwin Dada, *  Relative Name and Phone Number:       Current Level of Care: Hospital Recommended Level of Care: [place] Prior Approval Number:    Date Approved/Denied:   PASRR Number: [id number]  Discharge Plan: SNF    Current Diagnoses: Patient Active Problem List   Diagnosis Date Noted   Falls frequently 09/26/2019   CKD (chronic kidney disease), stage III 02/16/2019   DOE (dyspnea on exertion) 02/15/2019   Mitral regurgitation    Aortic insufficiency    Right knee dislocation 05/26/2011   Knee dislocation 04/03/2011   Carotid artery disease ([facility])    Ejection fraction    Shortness of breath    Dizziness    Hx of CABG    Dyslipidemia    Hypertension    CAD (coronary artery disease) of artery bypass graft    TIA (transient ischemic attack)    Bradycardia    Diabetes mellitus type 2 in nonobese ([place]) 12/20/2008   COLITIS, HX OF 12/20/2008   NEPHROLITHIASIS, HX OF 12/20/2008   ASBESTOS EXPOSURE, HX OF 12/20/2008    Orientation RESPIRATION BLADDER Height & Weight     Self, Situation, Place  Normal External catheter Weight: 80.7 kg Height:  5\' 11"  (180.3 cm)  BEHAVIORAL SYMPTOMS/MOOD NEUROLOGICAL BOWEL NUTRITION STATUS      Incontinent Diet(Heart healthy/carb modified. See d/c summary for updates.)  AMBULATORY STATUS COMMUNICATION OF NEEDS Skin   Extensive Assist Verbally Normal                       Personal Care Assistance Level of Assistance  Bathing, Dressing, Feeding Bathing Assistance: Limited assistance Feeding assistance: Limited  assistance Dressing Assistance: Limited assistance     Functional Limitations Info  Sight, Speech, Hearing Sight Info: Impaired Hearing Info: Adequate Speech Info: Adequate    SPECIAL CARE FACTORS FREQUENCY  PT (By licensed PT)                    Contractures      Additional Factors Info  Code Status, Allergies, Isolation Precautions, Psychotropic Code Status Info: Full code Allergies Info: Sulfonamide derivatives, Tetracycline Psychotropic Info: Celexa, Valium   Isolation Precautions Info: Enteric     Current Medications (09/27/2019):  This is the current hospital active medication list Current Facility-Administered Medications  Medication Dose Route Frequency Provider Last Rate Last Admin   acetaminophen (TYLENOL) tablet 650 mg  650 mg Oral Q6H PRN Emokpae, Ejiroghene E, MD   650 mg at 09/27/19 0913   Or   acetaminophen (TYLENOL) suppository 650 mg  650 mg Rectal Q6H PRN Emokpae, Ejiroghene E, MD       aspirin EC tablet 81 mg  81 mg Oral Daily Emokpae, Ejiroghene E, MD   81 mg at 09/27/19 0903   atorvastatin (LIPITOR) tablet 20 mg  20 mg Oral q morning - 10a Emokpae, Ejiroghene E, MD   20 mg at 09/27/19 0900   citalopram (CELEXA) tablet 20 mg  20 mg Oral Daily Emokpae, Ejiroghene E, MD   20 mg at 09/27/19 0900  clopidogrel (PLAVIX) tablet 75 mg  75 mg Oral q morning - 10a Emokpae, Ejiroghene E, MD   75 mg at 09/27/19 0900   enoxaparin (LOVENOX) injection 40 mg  40 mg Subcutaneous QHS Emokpae, Ejiroghene E, MD   40 mg at 09/26/19 2352   [START ON 09/28/2019] furosemide (LASIX) tablet 40 mg  40 mg Oral Daily Davetta Olliff, Suann Larry, MD       insulin aspart (novoLOG) injection 0-5 Units  0-5 Units Subcutaneous QHS Emokpae, Ejiroghene E, MD       insulin aspart (novoLOG) injection 0-9 Units  0-9 Units Subcutaneous TID WC Emokpae, Ejiroghene E, MD   1 Units at 09/27/19 1134   loratadine (CLARITIN) tablet 10 mg  10 mg Oral Daily Emokpae, Ejiroghene E, MD   10 mg at 09/27/19 0900    metoprolol succinate (TOPROL-XL) 24 hr tablet 25 mg  25 mg Oral q morning - 10a Emokpae, Ejiroghene E, MD   25 mg at 09/27/19 0900   ondansetron (ZOFRAN) tablet 4 mg  4 mg Oral Q6H PRN Emokpae, Ejiroghene E, MD       Or   ondansetron (ZOFRAN) injection 4 mg  4 mg Intravenous Q6H PRN Emokpae, Ejiroghene E, MD       polyethylene glycol (MIRALAX / GLYCOLAX) packet 17 g  17 g Oral Daily PRN Emokpae, Ejiroghene E, MD       rOPINIRole (REQUIP) tablet 2 mg  2 mg Oral QHS Emokpae, Ejiroghene E, MD   2 mg at 09/26/19 2351     Discharge Medications: Please see discharge summary for a list of discharge medications.  Relevant Imaging Results:  Relevant Lab Results:   Additional Information SSN: [id number]. Pt has not had 3 night stay. High fall risk. Pt has had COVID vaccines.  Edwin Dada, MD

## 2019-09-27 NOTE — Discharge Summary (Signed)
Physician Discharge Summary  [name] [id number] DOB: 1926-06-11 DOA: 09/26/2019  PCP: Glenda Chroman, MD  Admit date: 09/26/2019 Discharge date: 09/27/2019  Admitted From: Home  Disposition:  Home with [facility]   Recommendations for Outpatient Follow-up:  1. Follow up with PCP in 1-2 weeks 2. Please obtain BMP/CBC in one week     Home Health: PT/OT due to ongoing gait and balance issues  Equipment/Devices: Rolling walker  Discharge Condition: Fair  CODE STATUS: FULL Diet recommendation: Regular  Brief/Interim Summary: [name] is a 86 y.o. M with chronic diastolic and systolic CHF EF [id number], hx CABG, HTN and DM who presented with progressive weakness over several weeks.  Has had numerous falls recently.  In the ER, patient was afebrile, BP and HR and RR normal.  WBC normal.  Electrolytes and renal function normal.  CXR, EKG, head CT unremarkable.        PRINCIPAL HOSPITAL DIAGNOSIS: Weakness    Discharge Diagnoses:   Weakness The patient was observed overnight.  He was able to ambulate with a walker, which is his baseline.  He was interactive and without acute neurological change.  His chemistries, blood counts, CXR were reassuring and he had no focal signs of infection.  Recommend HHPT and PCP follow up.    Chronic diastolic and systolic CHF Hypertension Ischemic cardiomyopathy Continue aspirin, atorvastatin, Plavix, metoprolol, furosemide  Diabetes Continue sliding scale corrections while in hospital, resume metformin at discharge.  Memory impairment Avoid benzodiazepines, Haldol.  Restless leg syndrome Continue Requip      Discharge Instructions   Allergies as of 09/27/2019      Reactions   Sulfonamide Derivatives Rash   Tetracycline Rash      Medication List    STOP taking these medications   diazepam 5 MG tablet Commonly known as: VALIUM     TAKE these medications   aspirin EC 81 MG tablet Take 1 tablet (81 mg total) by mouth  daily.   atorvastatin 20 MG tablet Commonly known as: LIPITOR Take 20 mg by mouth every morning.   cetirizine 10 MG tablet Commonly known as: ZYRTEC Take 10 mg by mouth daily.   citalopram 40 MG tablet Commonly known as: CELEXA Take 0.5 tablets (20 mg total) by mouth daily. Please cut your existing tablets with a pill cutter. If you have any difficulty, let your primary care doctor know.   clopidogrel 75 MG tablet Commonly known as: PLAVIX Take 1 tablet (75 mg total) by mouth daily. What changed: when to take this   furosemide 40 MG tablet Commonly known as: Lasix Take 1 tablet (40 mg total) by mouth 2 (two) times daily. What changed: when to take this   metFORMIN 500 MG tablet Commonly known as: GLUCOPHAGE Take 500 mg by mouth at bedtime.   metoprolol succinate 25 MG 24 hr tablet Commonly known as: TOPROL-XL Take 1 tablet by mouth once daily What changed: when to take this   multivitamin tablet Take 1 tablet by mouth daily.   nitroGLYCERIN 0.4 MG SL tablet Commonly known as: NITROSTAT Place 1 tablet (0.4 mg total) under the tongue every 5 (five) minutes as needed for chest pain.   potassium chloride SA 20 MEQ tablet Commonly known as: KLOR-CON Take 1 tablet (20 mEq total) by mouth daily. What changed:   when to take this  reasons to take this   rOPINIRole 1 MG tablet Commonly known as: REQUIP Take 1-2 mg by mouth See admin instructions. Take 1 tab  daily as needed and takes 2 tablets in the evening       Allergies  Allergen Reactions  . Sulfonamide Derivatives Rash  . Tetracycline Rash    Consultations:     Procedures/Studies: DG Chest 2 View  Result Date: 09/26/2019 CLINICAL DATA:  Shortness of breath headache EXAM: CHEST - 2 VIEW COMPARISON:  04/16/2019, CT 07/09/2019, chest x-ray 09/24/2016 FINDINGS: Post sternotomy changes. No significant pleural effusion. Streaky airspace disease at the left base. Stable cardiomediastinal silhouette. No  pneumothorax. Surgical hardware in the proximal left humerus. IMPRESSION: Streaky left basilar opacity, favor atelectasis. Electronically Signed   By: Donavan Foil M.D.   On: 09/26/2019 18:18   CT HEAD WO CONTRAST  Result Date: 09/26/2019 CLINICAL DATA:  Multiple falls with headaches EXAM: CT HEAD WITHOUT CONTRAST TECHNIQUE: Contiguous axial images were obtained from the base of the skull through the vertex without intravenous contrast. COMPARISON:  07/11/2019 FINDINGS: Brain: No evidence of acute infarction, hemorrhage, hydrocephalus, extra-axial collection or mass lesion/mass effect. Chronic atrophic changes and white matter ischemic changes are noted. Vascular: No hyperdense vessel or unexpected calcification. Skull: Normal. Negative for fracture or focal lesion. Sinuses/Orbits: No acute finding. Other: None. IMPRESSION: Chronic atrophic and ischemic changes stable from the prior exam. No acute abnormality is noted. Electronically Signed   By: Inez Catalina M.D.   On: 09/26/2019 23:11      Subjective: He is tired after being given Haldol yesterday.  He is asking to go home.  He has no headache, chest pain, fever.  He has no vomiting, abdominal pain.  Discharge Exam: Vitals:   09/27/19 0316 09/27/19 1433  BP: (!) 144/87   Pulse: 85   Resp: 20   Temp: 97.7 F (36.5 C)   SpO2: 97% 96%   Vitals:   09/26/19 2345 09/27/19 0000 09/27/19 0316 09/27/19 1433  BP: 137/76  (!) 144/87   Pulse: 75  85   Resp: 20  20   Temp: 98.3 F (36.8 C)  97.7 F (36.5 C)   TempSrc: Oral  Oral   SpO2: 94%  97% 96%  Weight:  80.7 kg    Height:  5\' 11"  (1.803 m)      General: Pt is sleepy but rouses to touch, answers questions, no obvious acute distress Cardiovascular: RRR, nl S1-S2, no murmurs appreciated.   No LE edema.   Respiratory: Normal respiratory rate and rhythm.  CTAB without rales or wheezes. Abdominal: Abdomen soft and non-tender.  No distension or HSM.   Neuro/Psych: Strength symmetric in  upper and lower extremities but with generalized weakness.  Judgment and insight appear impaired.   The results of significant diagnostics from this hospitalization (including imaging, microbiology, ancillary and laboratory) are listed below for reference.     Microbiology: Recent Results (from the past 240 hour(s))  SARS Coronavirus 2 by RT PCR (hospital order, performed in [facility] hospital lab) Nasopharyngeal Nasopharyngeal Swab     Status: None   Collection Time: 09/26/19  6:36 PM   Specimen: Nasopharyngeal Swab  Result Value Ref Range Status   SARS Coronavirus 2 NEGATIVE NEGATIVE Final    Comment: (NOTE) SARS-CoV-2 target nucleic acids are NOT DETECTED. The SARS-CoV-2 RNA is generally detectable in upper and lower respiratory specimens during the acute phase of infection. The lowest concentration of SARS-CoV-2 viral copies this assay can detect is 250 copies / mL. A negative result does not preclude SARS-CoV-2 infection and should not be used as the sole basis for treatment or  other patient management decisions.  A negative result may occur with improper specimen collection / handling, submission of specimen other than nasopharyngeal swab, presence of viral mutation(s) within the areas targeted by this assay, and inadequate number of viral copies (<250 copies / mL). A negative result must be combined with clinical observations, patient history, and epidemiological information. Fact Sheet for Patients:   StrictlyIdeas.no Fact Sheet for Healthcare Providers: BankingDealers.co.za This test is not yet approved or cleared  by the [place] FDA and has been authorized for detection and/or diagnosis of SARS-CoV-2 by FDA under an Emergency Use Authorization (EUA).  This EUA will remain in effect (meaning this test can be used) for the duration of the COVID-19 declaration under Section 564(b)(1) of the Act, 21 U.S.C. section  360bbb-3(b)(1), unless the authorization is terminated or revoked sooner. Performed at [facility], [address], [place], [place] [address]      Labs: BNP (last 3 results) Recent Labs    04/16/19 1525 09/26/19 1819  BNP 184.0* [id number]*   Basic Metabolic Panel: Recent Labs  Lab 09/26/19 1819  NA 141  K 3.8  CL 100  CO2 27  GLUCOSE 114*  BUN 12  CREATININE 1.19  CALCIUM 8.9   Liver Function Tests: No results for input(s): AST, ALT, ALKPHOS, BILITOT, PROT, ALBUMIN in the last 168 hours. No results for input(s): LIPASE, AMYLASE in the last 168 hours. No results for input(s): AMMONIA in the last 168 hours. CBC: Recent Labs  Lab 09/26/19 1819  WBC 10.5  NEUTROABS 8.2*  HGB 11.9*  HCT 37.2*  MCV 95.9  PLT 313   Cardiac Enzymes: No results for input(s): CKTOTAL, CKMB, CKMBINDEX, TROPONINI in the last 168 hours. BNP: Invalid input(s): POCBNP CBG: Recent Labs  Lab 09/26/19 2349 09/27/19 0714 09/27/19 1101  GLUCAP 90 109* 137*   D-Dimer No results for input(s): DDIMER in the last 72 hours. Hgb A1c No results for input(s): HGBA1C in the last 72 hours. Lipid Profile No results for input(s): CHOL, HDL, LDLCALC, TRIG, CHOLHDL, LDLDIRECT in the last 72 hours. Thyroid function studies No results for input(s): TSH, T4TOTAL, T3FREE, THYROIDAB in the last 72 hours.  Invalid input(s): FREET3 Anemia work up No results for input(s): VITAMINB12, FOLATE, FERRITIN, TIBC, IRON, RETICCTPCT in the last 72 hours. Urinalysis    Component Value Date/Time   COLORURINE STRAW (A) 09/26/2019 1836   APPEARANCEUR CLEAR 09/26/2019 1836   LABSPEC 1.005 09/26/2019 1836   PHURINE 5.0 09/26/2019 1836   GLUCOSEU NEGATIVE 09/26/2019 1836   HGBUR NEGATIVE 09/26/2019 1836   BILIRUBINUR NEGATIVE 09/26/2019 1836   KETONESUR NEGATIVE 09/26/2019 1836   PROTEINUR NEGATIVE 09/26/2019 1836   NITRITE NEGATIVE 09/26/2019 1836   LEUKOCYTESUR NEGATIVE 09/26/2019 1836   Sepsis Labs Invalid  input(s): PROCALCITONIN,  WBC,  LACTICIDVEN Microbiology Recent Results (from the past 240 hour(s))  SARS Coronavirus 2 by RT PCR (hospital order, performed in [facility] hospital lab) Nasopharyngeal Nasopharyngeal Swab     Status: None   Collection Time: 09/26/19  6:36 PM   Specimen: Nasopharyngeal Swab  Result Value Ref Range Status   SARS Coronavirus 2 NEGATIVE NEGATIVE Final    Comment: (NOTE) SARS-CoV-2 target nucleic acids are NOT DETECTED. The SARS-CoV-2 RNA is generally detectable in upper and lower respiratory specimens during the acute phase of infection. The lowest concentration of SARS-CoV-2 viral copies this assay can detect is 250 copies / mL. A negative result does not preclude SARS-CoV-2 infection and should not be used as the  sole basis for treatment or other patient management decisions.  A negative result may occur with improper specimen collection / handling, submission of specimen other than nasopharyngeal swab, presence of viral mutation(s) within the areas targeted by this assay, and inadequate number of viral copies (<250 copies / mL). A negative result must be combined with clinical observations, patient history, and epidemiological information. Fact Sheet for Patients:   StrictlyIdeas.no Fact Sheet for Healthcare Providers: BankingDealers.co.za This test is not yet approved or cleared  by the [place] FDA and has been authorized for detection and/or diagnosis of SARS-CoV-2 by FDA under an Emergency Use Authorization (EUA).  This EUA will remain in effect (meaning this test can be used) for the duration of the COVID-19 declaration under Section 564(b)(1) of the Act, 21 U.S.C. section 360bbb-3(b)(1), unless the authorization is terminated or revoked sooner. Performed at [facility], [address], [place], [place] [address]      Time coordinating discharge: 35 minutes      SIGNED:   Edwin Dada, MD  [facility] 09/27/2019, 4:27 PM

## 2019-09-27 NOTE — Progress Notes (Signed)
Patient calm and cooperative at this time. Spoke with patient and he agreed to remain calm and not become agitated or combative. Restraints removed.

## 2019-09-27 NOTE — Progress Notes (Addendum)
No BM x2 days. Pt states he took immodium a few days before coming to the hospital due to problems with constipation. Pt up to bedside commode but unable to have BM at this time. Informed MD, order received to D/C cdiff and enteric precautions.

## 2019-09-27 NOTE — Progress Notes (Signed)
Patient climbing out of bed multiple times throughout shift. Patient redirected to bed several times and instructed to call before getting up. Patient given call light and instructed on how to call for assistance.  Bed alarm on, Floor mat in place. Patient given melatonin 9 mg PO once at 2204. Patient continuing to climb out of bed at 2344. Patient oriented x1. Patient stated that "Im not tired and Im not laying in the bed. I want to sleep and can't." MD notified. New order for Valium 5 mg PO once given.

## 2019-09-27 NOTE — Plan of Care (Signed)
°  Problem: Acute Rehab PT Goals(only PT should resolve) Goal: Pt Will Go Supine/Side To Sit Outcome: Progressing Flowsheets (Taken 09/27/2019 1508) Pt will go Supine/Side to Sit: with modified independence Goal: Patient Will Transfer Sit To/From Stand Outcome: Progressing Flowsheets (Taken 09/27/2019 1508) Patient will transfer sit to/from stand: with supervision Goal: Pt Will Transfer Bed To Chair/Chair To Bed Outcome: Progressing Flowsheets (Taken 09/27/2019 1508) Pt will Transfer Bed to Chair/Chair to Bed: with supervision Goal: Pt Will Ambulate Outcome: Progressing Flowsheets (Taken 09/27/2019 1508) Pt will Ambulate:  75 feet  with supervision  with min guard assist  with rolling walker   3:08 PM, 09/27/19 Lonell Grandchild, MPT Physical Therapist with [facility] 336 [phone] office [phone] mobile phone

## 2019-09-27 NOTE — Progress Notes (Signed)
Pt's daughter, Nelvin Mccowan, contacted about pt's condition. Daughter understands restraint situation. Stated other similar events occurred at other hospital admissions.

## 2019-09-28 DIAGNOSIS — R296 Repeated falls: Secondary | ICD-10-CM | POA: Diagnosis not present

## 2019-09-28 LAB — GLUCOSE, CAPILLARY
Glucose-Capillary: 134 mg/dL — ABNORMAL HIGH (ref 70–99)
Glucose-Capillary: 155 mg/dL — ABNORMAL HIGH (ref 70–99)
Glucose-Capillary: 165 mg/dL — ABNORMAL HIGH (ref 70–99)
Glucose-Capillary: 177 mg/dL — ABNORMAL HIGH (ref 70–99)
Glucose-Capillary: 144 mg/dL — ABNORMAL HIGH (ref 70–99)

## 2019-09-28 MED ORDER — RISPERIDONE 0.25 MG PO TABS: 0.2500 mg | ORAL_TABLET | Freq: Every day | ORAL | Status: AC

## 2019-09-28 MED ORDER — RISPERIDONE 0.5 MG PO TABS
0.2500 mg | ORAL_TABLET | Freq: Every day | ORAL | Status: DC
  Administered 2019-09-28 – 2019-10-01 (×4): 0.25 mg via ORAL
  Filled 2019-09-28 (×4): qty 1

## 2019-09-28 NOTE — Progress Notes (Signed)
PROGRESS NOTE    ESPEN BETHEL  CXK:[id number] DOB: 1926-06-19 DOA: 09/26/2019 PCP: Glenda Chroman, MD      Brief Narrative:  [name] is a 86 y.o. M with chronic diastolic and systolic CHF EF 97-02%, hx CABG, HTN and DM who presented with progressive weakness over several weeks.  Has had numerous falls recently.  In the ER, patient was afebrile, BP and HR and RR normal.  WBC normal.  Electrolytes and renal function normal.  CXR, EKG, head CT unremarkable.        Assessment & Plan:  Diarrhea and dehydration The initial justification for observation was frequent falls due to chronic issues (advanced age, deconditioning) and possibly acute dehydration related to diarrhea.  However, fluids were administered in ER and no further fluids were needed, nor do they appear to me to be needed now (patient taking good oral intake).  Furthermore, he reported taking Imodium prior to admission and that his diarrhea was resolved  At present, he requires 1 assist to ambulate, and is unsteady.  This appears to be close to his baseline.     Mild-cognitive impairment and sundowning The patient appears to have mild cognitive impairment, expected for age.  Family describe that in his home environment, he is oriented and interactive and can participate in self cares.  Here in the hospital, he is agitated at night, which is expected given his degree of age-related cognitive impairment in unfamiliar surroundings.  He has been treated in the past with risperidone low dose, successfully per family.   -Start risperidone 0.25 mg nightly -Continue citalopram Delirium precautions:   -Lights and TV off, minimize interruptions at night  -Blinds open and lights on during day  -Glasses/hearing aid with patient  -Frequent reorientation  -PT/OT when able  -Avoid sedation medications/Beers list medications NO BENZODIAZEPINES    Chronic diastolic and systolic CHF Hypertension Ischemic  cardiomyopathy Continue aspirin, atorvastatin, Plavix, metoprolol, furosemide  Diabetes Continue sliding scale corrections while in hospital, resume metformin at discharge.  Restless leg syndrome Continue Requip         Disposition: Patient was discharged yesterday.    It appears that there is disagreement between family and hospital regarding appropriate custodial care for patient, compounded by physical therapy assessment yesterday morning after the patient had been given 5 mg IV Haldol which I believe was misleading (in my medical opinion, the patient is a fall risk due to chronic factors, and based on my observations of him interacting with me and nursing, he has a safe disposition to home).    At present he is medically stable, and I believe is awaiting a determination from [facility] regarding his appropriate site of care.              MDM: The below labs and imaging reports were reviewed and summarized above.  Medication management as above.   DVT prophylaxis: Outpatient status Code Status: FULL Family Communication:     Consultants:     Procedures:     Antimicrobials:      Culture data:              Subjective: Patient has no complaints.  The patient was disoriented and uncooperative overnight.  This morning is redirectable.  No fever.  No diarrhea overnight.  No abdominal pain.  He would like his glasses, and coffee.  He is asking about going home.     Objective: Vitals:   09/27/19 1433 09/27/19 1950 09/28/19 0645 09/28/19 1431  BP:  Marland Kitchen)  148/94 (!) 154/98 118/89  Pulse:  84 95 71  Resp:  17 18 20   Temp:  98.4 F (36.9 C) 98.9 F (37.2 C) 97.7 F (36.5 C)  TempSrc:  Oral Oral Oral  SpO2: 96% 96% 100% 94%  Weight:      Height:        Intake/Output Summary (Last 24 hours) at 09/28/2019 1528 Last data filed at 09/27/2019 2200 Gross per 24 hour  Intake 120 ml  Output 200 ml  Net -80 ml   Filed Weights   09/26/19 1722 09/27/19  0000  Weight: 86.8 kg 80.7 kg    Examination: General appearance: Elderly adult male, alert and in no obvious distress.   HEENT: Anicteric, conjunctiva pink, lids and lashes normal for age (atrophic). No nasal deformity, discharge, epistaxis.  Lips moist, oropharynx moist, no oral lesions, hearing diminished.   Skin: Warm and dry.  No jaundice.  No suspicious rashes or lesions. Cardiac: RRR, nl S1-S2, no murmurs appreciated.  JVP normal.  No LE edema.    Respiratory: Normal respiratory rate and rhythm.  CTAB without rales or wheezes. Abdomen: Abdomen soft.  No TTP or guarding. No ascites, distension, hepatosplenomegaly.   MSK: No deformities or effusions. Neuro: Awake and interactive.  EOMI, moves all extremities with generalized weakness.  Shuffling gait. Speech fluent.    Psych: Sensorium intact and responding to questions, attention somewhat diminished. Affect blunted.  Judgment and insight appear impaired by dementia.    Data Reviewed: I have personally reviewed following labs and imaging studies:  CBC: Recent Labs  Lab 09/26/19 1819  WBC 10.5  NEUTROABS 8.2*  HGB 11.9*  HCT 37.2*  MCV 95.9  PLT 675   Basic Metabolic Panel: Recent Labs  Lab 09/26/19 1819  NA 141  K 3.8  CL 100  CO2 27  GLUCOSE 114*  BUN 12  CREATININE 1.19  CALCIUM 8.9   GFR: Estimated Creatinine Clearance: 43.9 mL/min (by C-G formula based on SCr of 1.19 mg/dL). Liver Function Tests: No results for input(s): AST, ALT, ALKPHOS, BILITOT, PROT, ALBUMIN in the last 168 hours. No results for input(s): LIPASE, AMYLASE in the last 168 hours. No results for input(s): AMMONIA in the last 168 hours. Coagulation Profile: No results for input(s): INR, PROTIME in the last 168 hours. Cardiac Enzymes: No results for input(s): CKTOTAL, CKMB, CKMBINDEX, TROPONINI in the last 168 hours. BNP (last 3 results) No results for input(s): PROBNP in the last 8760 hours. HbA1C: No results for input(s): HGBA1C in the  last 72 hours. CBG: Recent Labs  Lab 09/27/19 1101 09/27/19 1637 09/27/19 2046 09/28/19 0758 09/28/19 1138  GLUCAP 137* 117* 132* 134* 155*   Lipid Profile: No results for input(s): CHOL, HDL, LDLCALC, TRIG, CHOLHDL, LDLDIRECT in the last 72 hours. Thyroid Function Tests: No results for input(s): TSH, T4TOTAL, FREET4, T3FREE, THYROIDAB in the last 72 hours. Anemia Panel: No results for input(s): VITAMINB12, FOLATE, FERRITIN, TIBC, IRON, RETICCTPCT in the last 72 hours. Urine analysis:    Component Value Date/Time   COLORURINE STRAW (A) 09/26/2019 1836   APPEARANCEUR CLEAR 09/26/2019 1836   LABSPEC 1.005 09/26/2019 1836   PHURINE 5.0 09/26/2019 1836   GLUCOSEU NEGATIVE 09/26/2019 1836   HGBUR NEGATIVE 09/26/2019 1836   BILIRUBINUR NEGATIVE 09/26/2019 1836   KETONESUR NEGATIVE 09/26/2019 1836   PROTEINUR NEGATIVE 09/26/2019 1836   NITRITE NEGATIVE 09/26/2019 1836   LEUKOCYTESUR NEGATIVE 09/26/2019 1836   Sepsis Labs: @LABRCNTIP (procalcitonin:4,lacticacidven:4)  ) Recent Results (from the past 240 hour(s))  SARS Coronavirus 2 by RT PCR (hospital order, performed in [facility] hospital lab) Nasopharyngeal Nasopharyngeal Swab     Status: None   Collection Time: 09/26/19  6:36 PM   Specimen: Nasopharyngeal Swab  Result Value Ref Range Status   SARS Coronavirus 2 NEGATIVE NEGATIVE Final    Comment: (NOTE) SARS-CoV-2 target nucleic acids are NOT DETECTED. The SARS-CoV-2 RNA is generally detectable in upper and lower respiratory specimens during the acute phase of infection. The lowest concentration of SARS-CoV-2 viral copies this assay can detect is 250 copies / mL. A negative result does not preclude SARS-CoV-2 infection and should not be used as the sole basis for treatment or other patient management decisions.  A negative result may occur with improper specimen collection / handling, submission of specimen other than nasopharyngeal swab, presence of viral mutation(s)  within the areas targeted by this assay, and inadequate number of viral copies (<250 copies / mL). A negative result must be combined with clinical observations, patient history, and epidemiological information. Fact Sheet for Patients:   StrictlyIdeas.no Fact Sheet for Healthcare Providers: BankingDealers.co.za This test is not yet approved or cleared  by the [place] FDA and has been authorized for detection and/or diagnosis of SARS-CoV-2 by FDA under an Emergency Use Authorization (EUA).  This EUA will remain in effect (meaning this test can be used) for the duration of the COVID-19 declaration under Section 564(b)(1) of the Act, 21 U.S.C. section 360bbb-3(b)(1), unless the authorization is terminated or revoked sooner. Performed at [facility], [address], [place], [place] [address]          Radiology Studies: DG Chest 2 View  Result Date: 09/26/2019 CLINICAL DATA:  Shortness of breath headache EXAM: CHEST - 2 VIEW COMPARISON:  04/16/2019, CT 07/09/2019, chest x-ray 09/24/2016 FINDINGS: Post sternotomy changes. No significant pleural effusion. Streaky airspace disease at the left base. Stable cardiomediastinal silhouette. No pneumothorax. Surgical hardware in the proximal left humerus. IMPRESSION: Streaky left basilar opacity, favor atelectasis. Electronically Signed   By: Donavan Foil M.D.   On: 09/26/2019 18:18   CT HEAD WO CONTRAST  Result Date: 09/26/2019 CLINICAL DATA:  Multiple falls with headaches EXAM: CT HEAD WITHOUT CONTRAST TECHNIQUE: Contiguous axial images were obtained from the base of the skull through the vertex without intravenous contrast. COMPARISON:  07/11/2019 FINDINGS: Brain: No evidence of acute infarction, hemorrhage, hydrocephalus, extra-axial collection or mass lesion/mass effect. Chronic atrophic changes and white matter ischemic changes are noted. Vascular: No hyperdense vessel or unexpected  calcification. Skull: Normal. Negative for fracture or focal lesion. Sinuses/Orbits: No acute finding. Other: None. IMPRESSION: Chronic atrophic and ischemic changes stable from the prior exam. No acute abnormality is noted. Electronically Signed   By: Inez Catalina M.D.   On: 09/26/2019 23:11        Scheduled Meds:  aspirin EC  81 mg Oral Daily   atorvastatin  20 mg Oral q morning - 10a   citalopram  20 mg Oral Daily   clopidogrel  75 mg Oral q morning - 10a   enoxaparin (LOVENOX) injection  40 mg Subcutaneous QHS   furosemide  40 mg Oral Daily   insulin aspart  0-5 Units Subcutaneous QHS   insulin aspart  0-9 Units Subcutaneous TID WC   loratadine  10 mg Oral Daily   metoprolol succinate  25 mg Oral q morning - 10a   risperiDONE  0.25 mg Oral QHS   rOPINIRole  2 mg Oral QHS   Continuous Infusions:  LOS: 0 days    Time spent: 25 minutes    Edwin Dada, MD [facility] 09/28/2019, 3:28 PM     Please page though [place] or Epic secure chat:  For [employer], Adult nurse

## 2019-09-28 NOTE — Plan of Care (Signed)

## 2019-09-28 NOTE — Care Management (Signed)
Discussed with Jackelyn Poling of [facility] about transferring patient to [facility] [facility] today. Due to patient climbing out of bed several times last night, she would like to come visit with patient prior to him transferring.

## 2019-09-28 NOTE — Care Management (Addendum)
Discussed with [name] that [name] can no longer make bed offer due to patient needs memory care. Will send out to local SNF's with memory care units. [name] elects to have his information sent to memory cares in [place].   Patient will need rehab and will need to transition to LTC. Family reported that patient does have funds to pay out of pocket for care.

## 2019-09-29 ENCOUNTER — Observation Stay (HOSPITAL_COMMUNITY): Payer: Medicare Other

## 2019-09-29 DIAGNOSIS — M79604 Pain in right leg: Secondary | ICD-10-CM | POA: Diagnosis not present

## 2019-09-29 DIAGNOSIS — I4891 Unspecified atrial fibrillation: Secondary | ICD-10-CM | POA: Diagnosis not present

## 2019-09-29 DIAGNOSIS — E1122 Type 2 diabetes mellitus with diabetic chronic kidney disease: Secondary | ICD-10-CM | POA: Diagnosis present

## 2019-09-29 DIAGNOSIS — N183 Chronic kidney disease, stage 3 unspecified: Secondary | ICD-10-CM | POA: Diagnosis not present

## 2019-09-29 DIAGNOSIS — M79605 Pain in left leg: Secondary | ICD-10-CM | POA: Diagnosis not present

## 2019-09-29 DIAGNOSIS — J302 Other seasonal allergic rhinitis: Secondary | ICD-10-CM | POA: Diagnosis not present

## 2019-09-29 DIAGNOSIS — E1151 Type 2 diabetes mellitus with diabetic peripheral angiopathy without gangrene: Secondary | ICD-10-CM | POA: Diagnosis present

## 2019-09-29 DIAGNOSIS — I130 Hypertensive heart and chronic kidney disease with heart failure and stage 1 through stage 4 chronic kidney disease, or unspecified chronic kidney disease: Secondary | ICD-10-CM | POA: Diagnosis present

## 2019-09-29 DIAGNOSIS — M6281 Muscle weakness (generalized): Secondary | ICD-10-CM | POA: Diagnosis not present

## 2019-09-29 DIAGNOSIS — R451 Restlessness and agitation: Secondary | ICD-10-CM | POA: Diagnosis present

## 2019-09-29 DIAGNOSIS — I10 Essential (primary) hypertension: Secondary | ICD-10-CM | POA: Diagnosis not present

## 2019-09-29 DIAGNOSIS — E860 Dehydration: Secondary | ICD-10-CM | POA: Diagnosis present

## 2019-09-29 DIAGNOSIS — Z741 Need for assistance with personal care: Secondary | ICD-10-CM | POA: Diagnosis not present

## 2019-09-29 DIAGNOSIS — N401 Enlarged prostate with lower urinary tract symptoms: Secondary | ICD-10-CM | POA: Diagnosis not present

## 2019-09-29 DIAGNOSIS — E785 Hyperlipidemia, unspecified: Secondary | ICD-10-CM | POA: Diagnosis not present

## 2019-09-29 DIAGNOSIS — F0390 Unspecified dementia without behavioral disturbance: Secondary | ICD-10-CM | POA: Diagnosis present

## 2019-09-29 DIAGNOSIS — Z66 Do not resuscitate: Secondary | ICD-10-CM | POA: Diagnosis not present

## 2019-09-29 DIAGNOSIS — R4189 Other symptoms and signs involving cognitive functions and awareness: Secondary | ICD-10-CM | POA: Diagnosis present

## 2019-09-29 DIAGNOSIS — Z8673 Personal history of transient ischemic attack (TIA), and cerebral infarction without residual deficits: Secondary | ICD-10-CM | POA: Diagnosis not present

## 2019-09-29 DIAGNOSIS — R197 Diarrhea, unspecified: Secondary | ICD-10-CM | POA: Diagnosis not present

## 2019-09-29 DIAGNOSIS — R1312 Dysphagia, oropharyngeal phase: Secondary | ICD-10-CM | POA: Diagnosis not present

## 2019-09-29 DIAGNOSIS — J90 Pleural effusion, not elsewhere classified: Secondary | ICD-10-CM | POA: Diagnosis not present

## 2019-09-29 DIAGNOSIS — N400 Enlarged prostate without lower urinary tract symptoms: Secondary | ICD-10-CM | POA: Diagnosis not present

## 2019-09-29 DIAGNOSIS — R0602 Shortness of breath: Secondary | ICD-10-CM

## 2019-09-29 DIAGNOSIS — Z85828 Personal history of other malignant neoplasm of skin: Secondary | ICD-10-CM | POA: Diagnosis not present

## 2019-09-29 DIAGNOSIS — I255 Ischemic cardiomyopathy: Secondary | ICD-10-CM | POA: Diagnosis present

## 2019-09-29 DIAGNOSIS — J99 Respiratory disorders in diseases classified elsewhere: Secondary | ICD-10-CM | POA: Diagnosis not present

## 2019-09-29 DIAGNOSIS — I5042 Chronic combined systolic (congestive) and diastolic (congestive) heart failure: Secondary | ICD-10-CM | POA: Diagnosis not present

## 2019-09-29 DIAGNOSIS — F339 Major depressive disorder, recurrent, unspecified: Secondary | ICD-10-CM | POA: Diagnosis not present

## 2019-09-29 DIAGNOSIS — I25810 Atherosclerosis of coronary artery bypass graft(s) without angina pectoris: Secondary | ICD-10-CM | POA: Diagnosis not present

## 2019-09-29 DIAGNOSIS — Z951 Presence of aortocoronary bypass graft: Secondary | ICD-10-CM | POA: Diagnosis not present

## 2019-09-29 DIAGNOSIS — Z955 Presence of coronary angioplasty implant and graft: Secondary | ICD-10-CM | POA: Diagnosis not present

## 2019-09-29 DIAGNOSIS — E119 Type 2 diabetes mellitus without complications: Secondary | ICD-10-CM | POA: Diagnosis not present

## 2019-09-29 DIAGNOSIS — I739 Peripheral vascular disease, unspecified: Secondary | ICD-10-CM | POA: Diagnosis not present

## 2019-09-29 DIAGNOSIS — K529 Noninfective gastroenteritis and colitis, unspecified: Secondary | ICD-10-CM | POA: Diagnosis present

## 2019-09-29 DIAGNOSIS — R296 Repeated falls: Secondary | ICD-10-CM | POA: Diagnosis not present

## 2019-09-29 DIAGNOSIS — I080 Rheumatic disorders of both mitral and aortic valves: Secondary | ICD-10-CM | POA: Diagnosis present

## 2019-09-29 DIAGNOSIS — R262 Difficulty in walking, not elsewhere classified: Secondary | ICD-10-CM | POA: Diagnosis not present

## 2019-09-29 DIAGNOSIS — F0391 Unspecified dementia with behavioral disturbance: Secondary | ICD-10-CM | POA: Diagnosis not present

## 2019-09-29 DIAGNOSIS — R1310 Dysphagia, unspecified: Secondary | ICD-10-CM | POA: Diagnosis not present

## 2019-09-29 DIAGNOSIS — Z9181 History of falling: Secondary | ICD-10-CM | POA: Diagnosis not present

## 2019-09-29 DIAGNOSIS — Z8249 Family history of ischemic heart disease and other diseases of the circulatory system: Secondary | ICD-10-CM | POA: Diagnosis not present

## 2019-09-29 DIAGNOSIS — I2510 Atherosclerotic heart disease of native coronary artery without angina pectoris: Secondary | ICD-10-CM | POA: Diagnosis not present

## 2019-09-29 DIAGNOSIS — I340 Nonrheumatic mitral (valve) insufficiency: Secondary | ICD-10-CM | POA: Diagnosis not present

## 2019-09-29 DIAGNOSIS — G2581 Restless legs syndrome: Secondary | ICD-10-CM | POA: Diagnosis not present

## 2019-09-29 DIAGNOSIS — Z20822 Contact with and (suspected) exposure to covid-19: Secondary | ICD-10-CM | POA: Diagnosis present

## 2019-09-29 LAB — COMPREHENSIVE METABOLIC PANEL
ALT: 14 U/L (ref 0–44)
AST: 24 U/L (ref 15–41)
Albumin: 4.1 g/dL (ref 3.5–5.0)
Alkaline Phosphatase: 98 U/L (ref 38–126)
Anion gap: 14 (ref 5–15)
BUN: 21 mg/dL (ref 8–23)
CO2: 24 mmol/L (ref 22–32)
Calcium: 8.8 mg/dL — ABNORMAL LOW (ref 8.9–10.3)
Chloride: 99 mmol/L (ref 98–111)
Creatinine, Ser: 1.39 mg/dL — ABNORMAL HIGH (ref 0.61–1.24)
GFR calc Af Amer: 51 mL/min — ABNORMAL LOW (ref >60)
GFR calc non Af Amer: 44 mL/min — ABNORMAL LOW (ref >60)
Glucose, Bld: 157 mg/dL — ABNORMAL HIGH (ref 70–99)
Potassium: 3.8 mmol/L (ref 3.5–5.1)
Sodium: 137 mmol/L (ref 135–145)
Total Bilirubin: 2 mg/dL — ABNORMAL HIGH (ref 0.3–1.2)
Total Protein: 7.7 g/dL (ref 6.5–8.1)

## 2019-09-29 LAB — GLUCOSE, CAPILLARY
Glucose-Capillary: 134 mg/dL — ABNORMAL HIGH (ref 70–99)
Glucose-Capillary: 157 mg/dL — ABNORMAL HIGH (ref 70–99)
Glucose-Capillary: 125 mg/dL — ABNORMAL HIGH (ref 70–99)
Glucose-Capillary: 138 mg/dL — ABNORMAL HIGH (ref 70–99)

## 2019-09-29 LAB — CBC
HCT: 38.3 % — ABNORMAL LOW (ref 39.0–52.0)
Hemoglobin: 12.5 g/dL — ABNORMAL LOW (ref 13.0–17.0)
MCH: 30.6 pg (ref 26.0–34.0)
MCHC: 32.6 g/dL (ref 30.0–36.0)
MCV: 93.9 fL (ref 80.0–100.0)
Platelets: 367 10*3/uL (ref 150–400)
RBC: 4.08 MIL/uL — ABNORMAL LOW (ref 4.22–5.81)
RDW: 13.8 % (ref 11.5–15.5)
WBC: 16.2 10*3/uL — ABNORMAL HIGH (ref 4.0–10.5)
nRBC: 0 % (ref 0.0–0.2)

## 2019-09-29 MED ORDER — METOPROLOL SUCCINATE ER 25 MG PO TB24
12.5000 mg | ORAL_TABLET | Freq: Every morning | ORAL | Status: DC
  Administered 2019-09-30 – 2019-10-02 (×3): 12.5 mg via ORAL
  Filled 2019-09-29 (×3): qty 1

## 2019-09-29 MED ORDER — SODIUM CHLORIDE 0.9 % IV SOLN: Freq: Once | INTRAVENOUS | Status: AC

## 2019-09-29 MED ORDER — FUROSEMIDE 20 MG PO TABS: 20.0000 mg | ORAL_TABLET | Freq: Every day | ORAL | Status: DC

## 2019-09-29 MED ORDER — ENOXAPARIN SODIUM 30 MG/0.3ML ~~LOC~~ SOLN
30.0000 mg | SUBCUTANEOUS | Status: DC
  Administered 2019-09-29: 30 mg via SUBCUTANEOUS
  Filled 2019-09-29: qty 0.3

## 2019-09-29 NOTE — Progress Notes (Addendum)
PROGRESS NOTE    [name]  CZY:[id number] DOB: 1926-05-19 DOA: 09/26/2019 PCP: Glenda Chroman, MD   Brief Narrative: [name] is a 86 y.o. M with chronic diastolic and systolic CHF EF 09-32%, hx CABG, HTN and DM who presented with progressive weakness over several weeks. Has had numerous falls recently.  In the ER, patient was afebrile, BP and HR and RR normal. WBC normal. Electrolytes and renal function normal. CXR, EKG, head CT unremarkable.   Assessment & Plan:   Active Problems:   Falls frequently  #1 frequent falls -seen by PT recommends SNF   #2 dehydration secondary to diarrhea RESOLVED   #3 cognitive impairment and sundowning-started on Resporal 0.25 mg at night on 09/28/2019 with citalopram being continued.  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  #5 history of essential hypertension-bp soft decrease metoprolol to 12.5 mg daily Hold Lasix  #6 type 2 diabetes on SSI.  Restart Metformin on discharge. CBG (last 3)  Recent Labs    09/28/19 2155 09/29/19 0733 09/29/19 1115  GLUCAP 144* 134* 157*    #7 restless leg syndrome on Requip.  #8 leukocytosis new since admission -patient had a episode of coughing while drinking water which happens at home. I will obtain chest x-ray. Consult speech therapy. White count 16.2 up from 10.5 on admission.  #9 increased creatinine from the time of admission patient with poor p.o. intake hold Lasix, give 500 cc of normal saline. Estimated body mass index is 24.81 kg/m as calculated from the following:   Height as of this encounter: 5\' 11"  (1.803 m).   Weight as of this encounter: 80.7 kg.  DVT prophylaxis: Lovenox  code Status: Full code Family Communication: Discussed with patient  disposition Plan:  Status is: obs  Dispo: The patient is from: home              Anticipated d/c is to:snf              Anticipated d/c date TF:TDDUKGU  Patient currently is medically   Consultants:none  Procedures:none Antimicrobials: none  Subjective: Sitting up in chair anxious to go home  Objective: Vitals:   09/28/19 1431 09/28/19 2158 09/29/19 0543 09/29/19 1114  BP: 118/89 115/67 110/72 99/72  Pulse: 71 (!) 103 (!) 102 93  Resp: 20 20 18 16   Temp: 97.7 F (36.5 C) 98.7 F (37.1 C) 98.8 F (37.1 C) 99 F (37.2 C)  TempSrc: Oral Oral Oral Oral  SpO2: 94% 96% 93% 96%  Weight:      Height:        Intake/Output Summary (Last 24 hours) at 09/29/2019 1353 Last data filed at 09/29/2019 1300 Gross per 24 hour  Intake 720 ml  Output --  Net 720 ml   Filed Weights   09/26/19 1722 09/27/19 0000  Weight: 86.8 kg 80.7 kg    Examination:  General exam: Appears calm and comfortable  Respiratory system: Clear to auscultation. Respiratory effort normal. Cardiovascular system: S1 & S2 heard, RRR. No JVD, murmurs, rubs, gallops or clicks. No pedal edema. Gastrointestinal system: Abdomen is nondistended, soft and nontender. No organomegaly or masses felt. Normal bowel sounds heard. Central nervous system: Alert and oriented. No focal neurological deficits. Extremities: Symmetric 5 x 5 power. Skin: No rashes, lesions or ulcers Psychiatry: Judgement and insight appear normal. Mood & affect appropriate.     Data Reviewed: I have personally reviewed following labs and imaging studies  CBC: Recent Labs  Lab 09/26/19 1819 09/29/19 1202  WBC 10.5 16.2*  NEUTROABS 8.2*  --   HGB 11.9* 12.5*  HCT 37.2* 38.3*  MCV 95.9 93.9  PLT 313 834   Basic Metabolic Panel: Recent Labs  Lab 09/26/19 1819 09/29/19 1202  NA 141 137  K 3.8 3.8  CL 100 99  CO2 27 24  GLUCOSE 114* 157*  BUN 12 21  CREATININE 1.19 1.39*  CALCIUM 8.9 8.8*   GFR: Estimated Creatinine Clearance: 37.6 mL/min (A) (by C-G formula based on SCr of 1.39 mg/dL (H)). Liver Function Tests: Recent Labs  Lab 09/29/19 1202  AST 24  ALT 14  ALKPHOS 98   BILITOT 2.0*  PROT 7.7  ALBUMIN 4.1   No results for input(s): LIPASE, AMYLASE in the last 168 hours. No results for input(s): AMMONIA in the last 168 hours. Coagulation Profile: No results for input(s): INR, PROTIME in the last 168 hours. Cardiac Enzymes: No results for input(s): CKTOTAL, CKMB, CKMBINDEX, TROPONINI in the last 168 hours. BNP (last 3 results) No results for input(s): PROBNP in the last 8760 hours. HbA1C: No results for input(s): HGBA1C in the last 72 hours. CBG: Recent Labs  Lab 09/28/19 1621 09/28/19 1656 09/28/19 2155 09/29/19 0733 09/29/19 1115  GLUCAP 165* 177* 144* 134* 157*   Lipid Profile: No results for input(s): CHOL, HDL, LDLCALC, TRIG, CHOLHDL, LDLDIRECT in the last 72 hours. Thyroid Function Tests: No results for input(s): TSH, T4TOTAL, FREET4, T3FREE, THYROIDAB in the last 72 hours. Anemia Panel: No results for input(s): VITAMINB12, FOLATE, FERRITIN, TIBC, IRON, RETICCTPCT in the last 72 hours. Sepsis Labs: No results for input(s): PROCALCITON, LATICACIDVEN in the last 168 hours.  Recent Results (from the past 240 hour(s))  SARS Coronavirus 2 by RT PCR (hospital order, performed in [facility] hospital lab) Nasopharyngeal Nasopharyngeal Swab     Status: None   Collection Time: 09/26/19  6:36 PM   Specimen: Nasopharyngeal Swab  Result Value Ref Range Status   SARS Coronavirus 2 NEGATIVE NEGATIVE Final    Comment: (NOTE) SARS-CoV-2 target nucleic acids are NOT DETECTED. The SARS-CoV-2 RNA is generally detectable in upper and lower respiratory specimens during the acute phase of infection. The lowest concentration of SARS-CoV-2 viral copies this assay can detect is 250 copies / mL. A negative result does not preclude SARS-CoV-2 infection and should not be used as the sole basis for treatment or other patient management decisions.  A negative result may occur with improper specimen collection / handling, submission of specimen other than  nasopharyngeal swab, presence of viral mutation(s) within the areas targeted by this assay, and inadequate number of viral copies (<250 copies / mL). A negative result must be combined with clinical observations, patient history, and epidemiological information. Fact Sheet for Patients:   StrictlyIdeas.no Fact Sheet for Healthcare Providers: BankingDealers.co.za This test is not yet approved or cleared  by the [place] FDA and has been authorized for detection and/or diagnosis of SARS-CoV-2 by FDA under an Emergency Use Authorization (EUA).  This EUA will remain in effect (meaning this test can be used) for the duration of the COVID-19 declaration under Section 564(b)(1) of the Act, 21 U.S.C. section 360bbb-3(b)(1), unless the authorization is terminated or revoked sooner. Performed at [facility], [address] [place], [place] [address]          Radiology Studies: No results found.      Scheduled Meds: . aspirin EC  81 mg Oral Daily  .  atorvastatin  20 mg Oral q morning - 10a  . citalopram  20 mg Oral Daily  . clopidogrel  75 mg Oral q morning - 10a  . furosemide  40 mg Oral Daily  . insulin aspart  0-5 Units Subcutaneous QHS  . insulin aspart  0-9 Units Subcutaneous TID WC  . loratadine  10 mg Oral Daily  . metoprolol succinate  25 mg Oral q morning - 10a  . risperiDONE  0.25 mg Oral QHS  . rOPINIRole  2 mg Oral QHS   Continuous Infusions:   LOS: 0 days    Georgette Shell, MD  09/29/2019, 1:53 PM

## 2019-09-29 NOTE — NC FL2 (Signed)
[place] LEVEL OF CARE SCREENING TOOL     IDENTIFICATION  Patient Name: [name] Birthdate: Nov 19, 1926 Sex: male Admission Date (Current Location): 09/26/2019  [place] and [place] Number:  [employer] and Address:  [place] [address] [place]      Provider Number: [id number]  Attending Physician Name and Address:  Georgette Shell, MD  Relative Name and Phone Number:  Edwinna Areola [phone]    Current Level of Care: Hospital Recommended Level of Care: [place] Prior Approval Number:    Date Approved/Denied:   PASRR Number: [id number]  Discharge Plan: SNF    Current Diagnoses: Patient Active Problem List   Diagnosis Date Noted  . Falls frequently 09/26/2019  . CKD (chronic kidney disease), stage III 02/16/2019  . DOE (dyspnea on exertion) 02/15/2019  . Mitral regurgitation   . Aortic insufficiency   . Right knee dislocation 05/26/2011  . Knee dislocation 04/03/2011  . Carotid artery disease ([place])   . Ejection fraction   . Shortness of breath   . Dizziness   . Hx of CABG   . Dyslipidemia   . Hypertension   . CAD (coronary artery disease) of artery bypass graft   . TIA (transient ischemic attack)   . Bradycardia   . Diabetes mellitus type 2 in nonobese ([place]) 12/20/2008  . COLITIS, HX OF 12/20/2008  . NEPHROLITHIASIS, HX OF 12/20/2008  . ASBESTOS EXPOSURE, HX OF 12/20/2008    Orientation RESPIRATION BLADDER Height & Weight     Self, Situation, Place  Normal External catheter Weight: 177 lb 14.6 oz (80.7 kg) Height:  5\' 11"  (180.3 cm)  BEHAVIORAL SYMPTOMS/MOOD NEUROLOGICAL BOWEL NUTRITION STATUS      Incontinent Diet  AMBULATORY STATUS COMMUNICATION OF NEEDS Skin   Extensive Assist Verbally Normal                       Personal Care Assistance Level of Assistance  Bathing, Feeding, Dressing Bathing Assistance: Limited assistance Feeding assistance: Limited  assistance Dressing Assistance: Limited assistance     Functional Limitations Info  Sight, Speech, Hearing Sight Info: Impaired Hearing Info: Adequate Speech Info: Adequate    SPECIAL CARE FACTORS FREQUENCY  PT (By licensed PT)     PT Frequency: 5 x per week              Contractures      Additional Factors Info  Code Status, Allergies, Psychotropic, Isolation Precautions Code Status Info: Full Allergies Info: Sulfonamide derivatives, Tetracycline Psychotropic Info: Celexa, Valium   Isolation Precautions Info: Enteric     Current Medications (09/29/2019):  This is the current hospital active medication list Current Facility-Administered Medications  Medication Dose Route Frequency Provider Last Rate Last Admin  . acetaminophen (TYLENOL) tablet 650 mg  650 mg Oral Q6H PRN Emokpae, Ejiroghene E, MD   650 mg at 09/27/19 0913   Or  . acetaminophen (TYLENOL) suppository 650 mg  650 mg Rectal Q6H PRN Emokpae, Ejiroghene E, MD      . aspirin EC tablet 81 mg  81 mg Oral Daily Emokpae, Ejiroghene E, MD   81 mg at 09/28/19 0848  . atorvastatin (LIPITOR) tablet 20 mg  20 mg Oral q morning - 10a Emokpae, Ejiroghene E, MD   20 mg at 09/28/19 0849  . citalopram (CELEXA) tablet 20 mg  20 mg Oral Daily Emokpae, Ejiroghene E, MD   20 mg at 09/28/19 0851  .  clopidogrel (PLAVIX) tablet 75 mg  75 mg Oral q morning - 10a Emokpae, Ejiroghene E, MD   75 mg at 09/28/19 0851  . furosemide (LASIX) tablet 40 mg  40 mg Oral Daily Edwin Dada, MD   40 mg at 09/28/19 0850  . insulin aspart (novoLOG) injection 0-5 Units  0-5 Units Subcutaneous QHS Emokpae, Ejiroghene E, MD      . insulin aspart (novoLOG) injection 0-9 Units  0-9 Units Subcutaneous TID WC Emokpae, Ejiroghene E, MD   2 Units at 09/28/19 1833  . loratadine (CLARITIN) tablet 10 mg  10 mg Oral Daily Emokpae, Ejiroghene E, MD   10 mg at 09/28/19 0849  . metoprolol succinate (TOPROL-XL) 24 hr tablet 25 mg  25 mg Oral q morning - 10a  Emokpae, Ejiroghene E, MD   25 mg at 09/28/19 0847  . ondansetron (ZOFRAN) tablet 4 mg  4 mg Oral Q6H PRN Emokpae, Ejiroghene E, MD       Or  . ondansetron (ZOFRAN) injection 4 mg  4 mg Intravenous Q6H PRN Emokpae, Ejiroghene E, MD      . polyethylene glycol (MIRALAX / GLYCOLAX) packet 17 g  17 g Oral Daily PRN Emokpae, Ejiroghene E, MD      . risperiDONE (RISPERDAL) tablet 0.25 mg  0.25 mg Oral QHS Danford, Suann Larry, MD   0.25 mg at 09/28/19 2111  . rOPINIRole (REQUIP) tablet 2 mg  2 mg Oral QHS Emokpae, Ejiroghene E, MD   2 mg at 09/28/19 2110     Discharge Medications: Please see discharge summary for a list of discharge medications.  Relevant Imaging Results:  Relevant Lab Results:   Additional Information SSN: [id number]. Pt has not had 3 night stay. High fall risk. Pt has had COVID vaccines.  Natasha Bence, LCSW

## 2019-09-29 NOTE — NC FL2 (Deleted)
[place] LEVEL OF CARE SCREENING TOOL     IDENTIFICATION  Patient Name: [name] Birthdate: 07-Feb-1927 Sex: male Admission Date (Current Location): 09/26/2019  [facility] and [place] Number:  [employer] and Address:  [place] [address], [place]      Provider Number: [id number]  Attending Physician Name and Address:  Georgette Shell, MD  Relative Name and Phone Number:  Edwinna Areola [phone]    Current Level of Care: Hospital Recommended Level of Care: [place] Prior Approval Number:    Date Approved/Denied:   PASRR Number: [id number]  Discharge Plan: SNF    Current Diagnoses: Patient Active Problem List   Diagnosis Date Noted  . Falls frequently 09/26/2019  . CKD (chronic kidney disease), stage III 02/16/2019  . DOE (dyspnea on exertion) 02/15/2019  . Mitral regurgitation   . Aortic insufficiency   . Right knee dislocation 05/26/2011  . Knee dislocation 04/03/2011  . Carotid artery disease ([place])   . Ejection fraction   . Shortness of breath   . Dizziness   . Hx of CABG   . Dyslipidemia   . Hypertension   . CAD (coronary artery disease) of artery bypass graft   . TIA (transient ischemic attack)   . Bradycardia   . Diabetes mellitus type 2 in nonobese ([place]) 12/20/2008  . COLITIS, HX OF 12/20/2008  . NEPHROLITHIASIS, HX OF 12/20/2008  . ASBESTOS EXPOSURE, HX OF 12/20/2008    Orientation RESPIRATION BLADDER Height & Weight     Self, Situation, Place  Normal External catheter Weight: 177 lb 14.6 oz (80.7 kg) Height:  5\' 11"  (180.3 cm)  BEHAVIORAL SYMPTOMS/MOOD NEUROLOGICAL BOWEL NUTRITION STATUS      Incontinent Diet  AMBULATORY STATUS COMMUNICATION OF NEEDS Skin   Extensive Assist Verbally Normal                       Personal Care Assistance Level of Assistance  Bathing, Feeding, Dressing Bathing Assistance: Limited assistance Feeding assistance: Limited  assistance Dressing Assistance: Limited assistance     Functional Limitations Info  Sight, Speech, Hearing Sight Info: Impaired Hearing Info: Adequate Speech Info: Adequate    SPECIAL CARE FACTORS FREQUENCY  PT (By licensed PT)     PT Frequency: 5 x per week              Contractures      Additional Factors Info  Code Status, Allergies, Psychotropic, Isolation Precautions Code Status Info: Full Allergies Info: Sulfonamide derivatives, Tetracycline Psychotropic Info: Celexa, Valium   Isolation Precautions Info: Enteric     Current Medications (09/29/2019):  This is the current hospital active medication list Current Facility-Administered Medications  Medication Dose Route Frequency Provider Last Rate Last Admin  . acetaminophen (TYLENOL) tablet 650 mg  650 mg Oral Q6H PRN Emokpae, Ejiroghene E, MD   650 mg at 09/27/19 0913   Or  . acetaminophen (TYLENOL) suppository 650 mg  650 mg Rectal Q6H PRN Emokpae, Ejiroghene E, MD      . aspirin EC tablet 81 mg  81 mg Oral Daily Emokpae, Ejiroghene E, MD   81 mg at 09/28/19 0848  . atorvastatin (LIPITOR) tablet 20 mg  20 mg Oral q morning - 10a Emokpae, Ejiroghene E, MD   20 mg at 09/28/19 0849  . citalopram (CELEXA) tablet 20 mg  20 mg Oral Daily Emokpae, Ejiroghene E, MD   20 mg at 09/28/19 0851  .  clopidogrel (PLAVIX) tablet 75 mg  75 mg Oral q morning - 10a Emokpae, Ejiroghene E, MD   75 mg at 09/28/19 0851  . furosemide (LASIX) tablet 40 mg  40 mg Oral Daily Edwin Dada, MD   40 mg at 09/28/19 0850  . insulin aspart (novoLOG) injection 0-5 Units  0-5 Units Subcutaneous QHS Emokpae, Ejiroghene E, MD      . insulin aspart (novoLOG) injection 0-9 Units  0-9 Units Subcutaneous TID WC Emokpae, Ejiroghene E, MD   2 Units at 09/28/19 1833  . loratadine (CLARITIN) tablet 10 mg  10 mg Oral Daily Emokpae, Ejiroghene E, MD   10 mg at 09/28/19 0849  . metoprolol succinate (TOPROL-XL) 24 hr tablet 25 mg  25 mg Oral q morning - 10a  Emokpae, Ejiroghene E, MD   25 mg at 09/28/19 0847  . ondansetron (ZOFRAN) tablet 4 mg  4 mg Oral Q6H PRN Emokpae, Ejiroghene E, MD       Or  . ondansetron (ZOFRAN) injection 4 mg  4 mg Intravenous Q6H PRN Emokpae, Ejiroghene E, MD      . polyethylene glycol (MIRALAX / GLYCOLAX) packet 17 g  17 g Oral Daily PRN Emokpae, Ejiroghene E, MD      . risperiDONE (RISPERDAL) tablet 0.25 mg  0.25 mg Oral QHS Danford, Suann Larry, MD   0.25 mg at 09/28/19 2111  . rOPINIRole (REQUIP) tablet 2 mg  2 mg Oral QHS Emokpae, Ejiroghene E, MD   2 mg at 09/28/19 2110     Discharge Medications: Please see discharge summary for a list of discharge medications.  Relevant Imaging Results:  Relevant Lab Results:   Additional Information SSN: [id number]. Pt has not had 3 night stay. High fall risk. Pt has had COVID vaccines.  Natasha Bence, LCSW

## 2019-09-29 NOTE — TOC Progression Note (Addendum)
Transition of Care (TOC) - Progression Note    Patient Details  Name: [name] MRN: [id number] Date of Birth: 06/29/1926  Transition of Care (TOC) CM/SW Contact   D , LCSW Phone Number: 09/29/2019, 2:13 PM  Clinical Narrative:    Patient is a 86 year old male admitted for frequent falls. Bed offer was made from [facility]. Family is agreeable to [facility] placement. CSW contacted facility and facility reported that there was not an admissions representative available to admit him until Monday 06/07. Family requested private room at [facility] and expressed willingness to pay difference if required. TOC to follow.    Expected Discharge Plan: Skilled Nursing Facility Barriers to Discharge: Facility will not accept until restraint criteria met  Expected Discharge Plan and Services Expected Discharge Plan: Skilled Nursing Facility In-house Referral: Clinical Social Work   Post Acute Care Choice: Skilled Nursing Facility Living arrangements for the past 2 months: Single Family Home Expected Discharge Date: 09/27/19                                     Social Determinants of Health (SDOH) Interventions    Readmission Risk Interventions No flowsheet data found.  

## 2019-09-29 NOTE — NC FL2 (Deleted)
[place] LEVEL OF CARE SCREENING TOOL     IDENTIFICATION  Patient Name: [name] Birthdate: 06/15/1926 Sex: male Admission Date (Current Location): 09/26/2019  [facility] and [place] Number:  [employer] and Address:  [place] [address], [place]      Provider Number: [id number]  Attending Physician Name and Address:  Georgette Shell, MD  Relative Name and Phone Number:  Edwinna Areola [phone]    Current Level of Care: Hospital Recommended Level of Care: [place] Prior Approval Number:    Date Approved/Denied:   PASRR Number: [id number]  Discharge Plan: SNF    Current Diagnoses: Patient Active Problem List   Diagnosis Date Noted  . Falls frequently 09/26/2019  . CKD (chronic kidney disease), stage III 02/16/2019  . DOE (dyspnea on exertion) 02/15/2019  . Mitral regurgitation   . Aortic insufficiency   . Right knee dislocation 05/26/2011  . Knee dislocation 04/03/2011  . Carotid artery disease ([place])   . Ejection fraction   . Shortness of breath   . Dizziness   . Hx of CABG   . Dyslipidemia   . Hypertension   . CAD (coronary artery disease) of artery bypass graft   . TIA (transient ischemic attack)   . Bradycardia   . Diabetes mellitus type 2 in nonobese ([place]) 12/20/2008  . COLITIS, HX OF 12/20/2008  . NEPHROLITHIASIS, HX OF 12/20/2008  . ASBESTOS EXPOSURE, HX OF 12/20/2008    Orientation RESPIRATION BLADDER Height & Weight     Self, Situation, Place  Normal External catheter Weight: 177 lb 14.6 oz (80.7 kg) Height:  5\' 11"  (180.3 cm)  BEHAVIORAL SYMPTOMS/MOOD NEUROLOGICAL BOWEL NUTRITION STATUS      Incontinent Diet  AMBULATORY STATUS COMMUNICATION OF NEEDS Skin   Extensive Assist Verbally Normal                       Personal Care Assistance Level of Assistance  Bathing, Feeding, Dressing Bathing Assistance: Limited assistance Feeding assistance: Limited  assistance Dressing Assistance: Limited assistance     Functional Limitations Info  Sight, Speech, Hearing Sight Info: Impaired Hearing Info: Adequate Speech Info: Adequate    SPECIAL CARE FACTORS FREQUENCY  PT (By licensed PT)     PT Frequency: 5 x per week              Contractures      Additional Factors Info  Code Status, Allergies, Psychotropic, Isolation Precautions Code Status Info: Full Allergies Info: Sulfonamide derivatives, Tetracycline Psychotropic Info: Celexa, Valium   Isolation Precautions Info: Enteric     Current Medications (09/29/2019):  This is the current hospital active medication list Current Facility-Administered Medications  Medication Dose Route Frequency Provider Last Rate Last Admin  . acetaminophen (TYLENOL) tablet 650 mg  650 mg Oral Q6H PRN Emokpae, Ejiroghene E, MD   650 mg at 09/27/19 0913   Or  . acetaminophen (TYLENOL) suppository 650 mg  650 mg Rectal Q6H PRN Emokpae, Ejiroghene E, MD      . aspirin EC tablet 81 mg  81 mg Oral Daily Emokpae, Ejiroghene E, MD   81 mg at 09/28/19 0848  . atorvastatin (LIPITOR) tablet 20 mg  20 mg Oral q morning - 10a Emokpae, Ejiroghene E, MD   20 mg at 09/28/19 0849  . citalopram (CELEXA) tablet 20 mg  20 mg Oral Daily Emokpae, Ejiroghene E, MD   20 mg at 09/28/19 0851  .  clopidogrel (PLAVIX) tablet 75 mg  75 mg Oral q morning - 10a Emokpae, Ejiroghene E, MD   75 mg at 09/28/19 0851  . furosemide (LASIX) tablet 40 mg  40 mg Oral Daily Edwin Dada, MD   40 mg at 09/28/19 0850  . insulin aspart (novoLOG) injection 0-5 Units  0-5 Units Subcutaneous QHS Emokpae, Ejiroghene E, MD      . insulin aspart (novoLOG) injection 0-9 Units  0-9 Units Subcutaneous TID WC Emokpae, Ejiroghene E, MD   2 Units at 09/28/19 1833  . loratadine (CLARITIN) tablet 10 mg  10 mg Oral Daily Emokpae, Ejiroghene E, MD   10 mg at 09/28/19 0849  . metoprolol succinate (TOPROL-XL) 24 hr tablet 25 mg  25 mg Oral q morning - 10a  Emokpae, Ejiroghene E, MD   25 mg at 09/28/19 0847  . ondansetron (ZOFRAN) tablet 4 mg  4 mg Oral Q6H PRN Emokpae, Ejiroghene E, MD       Or  . ondansetron (ZOFRAN) injection 4 mg  4 mg Intravenous Q6H PRN Emokpae, Ejiroghene E, MD      . polyethylene glycol (MIRALAX / GLYCOLAX) packet 17 g  17 g Oral Daily PRN Emokpae, Ejiroghene E, MD      . risperiDONE (RISPERDAL) tablet 0.25 mg  0.25 mg Oral QHS Danford, Suann Larry, MD   0.25 mg at 09/28/19 2111  . rOPINIRole (REQUIP) tablet 2 mg  2 mg Oral QHS Emokpae, Ejiroghene E, MD   2 mg at 09/28/19 2110     Discharge Medications: Please see discharge summary for a list of discharge medications.  Relevant Imaging Results:  Relevant Lab Results:   Additional Information SSN: [id number]. Pt has not had 3 night stay. High fall risk. Pt has had COVID vaccines.  Natasha Bence, LCSW

## 2019-09-29 NOTE — Progress Notes (Signed)
Choked on water earlier and said that he does that sometimes.  Rhonchi noted, temp 99.1, O2 94% on room air.  No labs since 6/2.  Contacted Dr. Kathreen Cosier and she ordered labs.  WBC 16.2 up from 10. 5 on 6/2

## 2019-09-30 LAB — GLUCOSE, CAPILLARY
Glucose-Capillary: 113 mg/dL — ABNORMAL HIGH (ref 70–99)
Glucose-Capillary: 108 mg/dL — ABNORMAL HIGH (ref 70–99)
Glucose-Capillary: 146 mg/dL — ABNORMAL HIGH (ref 70–99)
Glucose-Capillary: 163 mg/dL — ABNORMAL HIGH (ref 70–99)

## 2019-09-30 LAB — CBC
HCT: 37.4 % — ABNORMAL LOW (ref 39.0–52.0)
Hemoglobin: 12 g/dL — ABNORMAL LOW (ref 13.0–17.0)
MCH: 30.3 pg (ref 26.0–34.0)
MCHC: 32.1 g/dL (ref 30.0–36.0)
MCV: 94.4 fL (ref 80.0–100.0)
Platelets: 303 10*3/uL (ref 150–400)
RBC: 3.96 MIL/uL — ABNORMAL LOW (ref 4.22–5.81)
RDW: 13.8 % (ref 11.5–15.5)
WBC: 13 10*3/uL — ABNORMAL HIGH (ref 4.0–10.5)
nRBC: 0 % (ref 0.0–0.2)

## 2019-09-30 LAB — BASIC METABOLIC PANEL
Anion gap: 11 (ref 5–15)
BUN: 18 mg/dL (ref 8–23)
CO2: 27 mmol/L (ref 22–32)
Calcium: 8.8 mg/dL — ABNORMAL LOW (ref 8.9–10.3)
Chloride: 102 mmol/L (ref 98–111)
Creatinine, Ser: 1.18 mg/dL (ref 0.61–1.24)
GFR calc Af Amer: >60 mL/min (ref >60)
GFR calc non Af Amer: 54 mL/min — ABNORMAL LOW (ref >60)
Glucose, Bld: 109 mg/dL — ABNORMAL HIGH (ref 70–99)
Potassium: 3.7 mmol/L (ref 3.5–5.1)
Sodium: 140 mmol/L (ref 135–145)

## 2019-09-30 MED ORDER — ENOXAPARIN SODIUM 40 MG/0.4ML ~~LOC~~ SOLN
40.0000 mg | SUBCUTANEOUS | Status: DC
  Administered 2019-09-30 – 2019-10-01 (×2): 40 mg via SUBCUTANEOUS
  Filled 2019-09-30 (×2): qty 0.4

## 2019-09-30 NOTE — Progress Notes (Signed)
PROGRESS NOTE    [name]  WEX:[id number] DOB: [birth year] DOA: 09/26/2019 PCP: Glenda Chroman, MD    Brief Narrative: [name] is a 86 y.o. M with chronic diastolic and systolic CHF EF 93-81%, hx CABG, HTN and DM who presented with progressive weakness over several weeks. Has had numerous falls recently.  In the ER, patient was afebrile, BP and HR and RR normal. WBC normal. Electrolytes and renal function normal. CXR, EKG, head CT unremarkable.  Assessment & Plan:   Active Problems:   SOB (shortness of breath)   Falls frequently   Diarrhea  #1 frequent falls -seen by PT recommends SNF   #2 dehydration secondary to diarrhea resolved  #3 cognitive impairment and sundowning-started on Resporal 0.25 mg at night on 09/28/2019 with citalopram being continued.  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  #5 history of essential hypertension-blood pressure 121/77 soft decrease metoprolol to 12.5 mg daily Hold Lasix  #6 type 2 diabetes on SSI.  Restart Metformin on discharge. CBG (last 3)  Recent Labs    09/29/19 2155 09/30/19 0716 09/30/19 1104  GLUCAP 138* 113* 108*   #7 restless leg syndrome on Requip.  #8 leukocytosis new since admission -patient had a episode of coughing while drinking water which happens at home.  Chest x-ray shows no evidence of infiltrates.  Small right pleural effusion.  Cardiomegaly.  Speech therapy consulted.   #9 increased creatinine from the time of admission patient with poor p.o. intake hold Lasix, give 500 cc of normal saline.           #10 goals of care discussed with patient's daughter who is patient's healthcare power of attorney.  Patient is DO NOT RESUSCITATE.  He has a living will.  She is hoping that he can be discharged to [facility] tomorrow.   Estimated body mass index is 24.81  kg/m as calculated from the following:   Height as of this encounter: 5\' 11"  (1.803 m).   Weight as of this encounter: 80.7 kg.  DVT prophylaxis: Lovenox  code Status: Full code Family Communication: Discussed with patient  disposition Plan:  Status is inpatient  Dispo: The patient is from: home  Anticipated d/c is to:snf  Anticipated d/c date OF:BPZWCHE  Patient currently is medically stable for dc   Consultants:none  Procedures:none Antimicrobials: none  Subjective:  He is resting in bed in no acute distress no events overnight reported by the staff. Objective: Vitals:   09/29/19 0543 09/29/19 1114 09/29/19 2116 09/30/19 1037  BP: 110/72 99/72 (!) 164/82 121/77  Pulse: (!) 102 93 100 87  Resp: 18 16 16    Temp: 98.8 F (37.1 C) 99 F (37.2 C) 98.4 F (36.9 C)   TempSrc: Oral Oral Oral   SpO2: 93% 96% 96%   Weight:      Height:        Intake/Output Summary (Last 24 hours) at 09/30/2019 1326 Last data filed at 09/30/2019 0600 Gross per 24 hour  Intake 240 ml  Output 1350 ml  Net -1110 ml   Filed Weights   09/26/19 1722 09/27/19 0000  Weight: 86.8 kg 80.7 kg    Examination:  General exam: Appears calm and comfortable, oral mucosa is dry Respiratory system: Clear to auscultation. Respiratory effort normal. Cardiovascular system: S1 & S2 heard, RRR. No JVD, murmurs, rubs, gallops or clicks. No  pedal edema. Gastrointestinal system: Abdomen is nondistended, soft and nontender. No organomegaly or masses felt. Normal bowel sounds heard. Central nervous system: Awake alert  extremities: No edema  skin: No rashes, lesions or ulcers Psychiatry: Unable to assess  Data Reviewed: I have personally reviewed following labs and imaging studies  CBC: Recent Labs  Lab 09/26/19 1819 09/29/19 1202 09/30/19 0506  WBC 10.5 16.2* 13.0*  NEUTROABS 8.2*  --   --   HGB 11.9* 12.5* 12.0*  HCT 37.2* 38.3* 37.4*  MCV 95.9 93.9 94.4  PLT  313 367 656   Basic Metabolic Panel: Recent Labs  Lab 09/26/19 1819 09/29/19 1202 09/30/19 0506  NA 141 137 140  K 3.8 3.8 3.7  CL 100 99 102  CO2 27 24 27   GLUCOSE 114* 157* 109*  BUN 12 21 18   CREATININE 1.19 1.39* 1.18  CALCIUM 8.9 8.8* 8.8*   GFR: Estimated Creatinine Clearance: 44.3 mL/min (by C-G formula based on SCr of 1.18 mg/dL). Liver Function Tests: Recent Labs  Lab 09/29/19 1202  AST 24  ALT 14  ALKPHOS 98  BILITOT 2.0*  PROT 7.7  ALBUMIN 4.1   No results for input(s): LIPASE, AMYLASE in the last 168 hours. No results for input(s): AMMONIA in the last 168 hours. Coagulation Profile: No results for input(s): INR, PROTIME in the last 168 hours. Cardiac Enzymes: No results for input(s): CKTOTAL, CKMB, CKMBINDEX, TROPONINI in the last 168 hours. BNP (last 3 results) No results for input(s): PROBNP in the last 8760 hours. HbA1C: No results for input(s): HGBA1C in the last 72 hours. CBG: Recent Labs  Lab 09/29/19 1115 09/29/19 1623 09/29/19 2155 09/30/19 0716 09/30/19 1104  GLUCAP 157* 125* 138* 113* 108*   Lipid Profile: No results for input(s): CHOL, HDL, LDLCALC, TRIG, CHOLHDL, LDLDIRECT in the last 72 hours. Thyroid Function Tests: No results for input(s): TSH, T4TOTAL, FREET4, T3FREE, THYROIDAB in the last 72 hours. Anemia Panel: No results for input(s): VITAMINB12, FOLATE, FERRITIN, TIBC, IRON, RETICCTPCT in the last 72 hours. Sepsis Labs: No results for input(s): PROCALCITON, LATICACIDVEN in the last 168 hours.  Recent Results (from the past 240 hour(s))  SARS Coronavirus 2 by RT PCR (hospital order, performed in [facility] hospital lab) Nasopharyngeal Nasopharyngeal Swab     Status: None   Collection Time: 09/26/19  6:36 PM   Specimen: Nasopharyngeal Swab  Result Value Ref Range Status   SARS Coronavirus 2 NEGATIVE NEGATIVE Final    Comment: (NOTE) SARS-CoV-2 target nucleic acids are NOT DETECTED. The SARS-CoV-2 RNA is generally  detectable in upper and lower respiratory specimens during the acute phase of infection. The lowest concentration of SARS-CoV-2 viral copies this assay can detect is 250 copies / mL. A negative result does not preclude SARS-CoV-2 infection and should not be used as the sole basis for treatment or other patient management decisions.  A negative result may occur with improper specimen collection / handling, submission of specimen other than nasopharyngeal swab, presence of viral mutation(s) within the areas targeted by this assay, and inadequate number of viral copies (<250 copies / mL). A negative result must be combined with clinical observations, patient history, and epidemiological information. Fact Sheet for Patients:   StrictlyIdeas.no Fact Sheet for Healthcare Providers: BankingDealers.co.za This test is not yet approved or cleared  by the [place] FDA and has been authorized for detection and/or diagnosis of SARS-CoV-2 by FDA under an Emergency Use Authorization (EUA).  This EUA will remain in effect (meaning this test  can be used) for the duration of the COVID-19 declaration under Section 564(b)(1) of the Act, 21 U.S.C. section 360bbb-3(b)(1), unless the authorization is terminated or revoked sooner. Performed at [facility], [address] [place], [place] [address]          Radiology Studies: [facility] Chest [facility] 1 View  Result Date: 09/29/2019 CLINICAL DATA:  Altered mental status.  WBC. EXAM: PORTABLE CHEST 1 VIEW COMPARISON:  09/26/2019 FINDINGS: Median sternotomy and CABG. Stable cardiomegaly. There is small RIGHT pleural effusion, new since the prior study. Minimal subsegmental atelectasis bilaterally. Shallow lung inflation. Remote LEFT humerus ORIF. IMPRESSION: 1. Stable cardiomegaly. 2. New small RIGHT pleural effusion. Electronically Signed   By: Nolon Nations M.D.   On: 09/29/2019 15:54        Scheduled Meds: .  aspirin EC  81 mg Oral Daily  . atorvastatin  20 mg Oral q morning - 10a  . citalopram  20 mg Oral Daily  . clopidogrel  75 mg Oral q morning - 10a  . enoxaparin (LOVENOX) injection  40 mg Subcutaneous Q24H  . insulin aspart  0-5 Units Subcutaneous QHS  . insulin aspart  0-9 Units Subcutaneous TID WC  . loratadine  10 mg Oral Daily  . metoprolol succinate  12.5 mg Oral q morning - 10a  . risperiDONE  0.25 mg Oral QHS  . rOPINIRole  2 mg Oral QHS   Continuous Infusions:   LOS: 1 day      Georgette Shell, MD  09/30/2019, 1:26 PM

## 2019-10-01 LAB — GLUCOSE, CAPILLARY
Glucose-Capillary: 134 mg/dL — ABNORMAL HIGH (ref 70–99)
Glucose-Capillary: 143 mg/dL — ABNORMAL HIGH (ref 70–99)
Glucose-Capillary: 184 mg/dL — ABNORMAL HIGH (ref 70–99)
Glucose-Capillary: 151 mg/dL — ABNORMAL HIGH (ref 70–99)

## 2019-10-01 LAB — SARS CORONAVIRUS 2 BY RT PCR (HOSPITAL ORDER, PERFORMED IN ~~LOC~~ HOSPITAL LAB): SARS Coronavirus 2: NEGATIVE

## 2019-10-01 NOTE — TOC Progression Note (Signed)
Transition of Care [facility]) - Progression Note   Patient Details  Name: SELDON BARRELL MRN: [id number] Date of Birth: [birth year]  Transition of Care [facility]) CM/SW [place], LCSW Phone Number: 10/01/2019, 2:38 PM  Clinical Narrative: CSW spoke with Tora Kindred in admissions at [facility] to discuss bed offer. Per Ms. Burt Knack, [facility] does not have a locked memory care unit, so the bed offer was rescinded. CSW called [facility] to discuss the bed offer made. CSW was informed [facility] has accepted 3 other patients on the memory care unit today and may not have any more beds available.  CSW spoke with patient's daughter, Uzair Godley, to discuss patient's bed offers. Daughter requested the patient also be referred to [facility] as he was placed there in the past. CSW sent referral to [facility] and called [facility] to discuss the referral. [facility] willing to accept the referral and requested COVID vaccine dates. CSW spoke with daughter who is agreeable to [facility] accepting the patient. Daughter provided CSW with patient's COVID vaccination dates: 06/07/19 and 07/06/19 at the [facility]. Tammi updated about vaccine dates and she requested that patient be tested for COVID prior to admission, within 24-48 hours. CSW spoke with [facility] to order rapid COVID test.  Expected Discharge Plan: [place] Barriers to Discharge: Facility will not accept until restraint criteria met  Expected Discharge Plan and Services Expected Discharge Plan: [place] In-house Referral: Clinical Social Work Post Acute Care Choice: [place] arrangements for the past 2 months: Single Family Home Expected Discharge Date: 09/27/19                Readmission Risk Interventions No flowsheet data found.

## 2019-10-01 NOTE — Care Management Important Message (Signed)
Important Message  Patient Details  Name: [name] MRN: [id number] Date of Birth: 1926/07/30   Medicare Important Message Given:  Yes     Tommy Medal 10/01/2019, 2:50 PM

## 2019-10-01 NOTE — Evaluation (Signed)
Clinical/Bedside Swallow Evaluation Patient Details  Name: [name] MRN: [id number] Date of Birth: Oct 21, 1926  Today's Date: 10/01/2019 Time: SLP Start Time (ACUTE ONLY): 0940 SLP Stop Time (ACUTE ONLY): 1012 SLP Time Calculation (min) (ACUTE ONLY): 32 min  Past Medical History:  Past Medical History:  Diagnosis Date  . Aortic insufficiency    a. previously mild in 2012. b. trivial seen on echo in 05/2018.  . Asbestos exposure   . Bradycardia    MILD  . CAD (coronary artery disease) 2003   a. s/p CABG in 1994. b. Cath 2003 patent grafts. c. DOE in 2020 felt anginal equivalent - cath 01/2019 s/p PCI/DES to the SVG-PDA.  . Cancer ([place])    Skin  . Carotid artery disease ([place]) 2001   BILATERAL 0-39%/  Follow with Dopplers by Dr. Woody Seller  . Chronic combined systolic and diastolic CHF (congestive heart failure) ([place])   . CKD (chronic kidney disease), stage III   . Colitis   . Diabetes mellitus   . Dilated aortic root ([place]) 05/2018  . Dizziness    He has had falls related to this / Chronic dizziness  . Dyslipidemia   . Hx of CABG   . Hypertension   . Nephrolithiasis   . PVD (peripheral vascular disease) ([place])   . Renal cyst   . TIA (transient ischemic attack) 2007   ?? medical Rx    Past Surgical History:  Past Surgical History:  Procedure Laterality Date  . CORONARY ARTERY BYPASS GRAFT    . CORONARY STENT INTERVENTION N/A 02/15/2019   Procedure: CORONARY STENT INTERVENTION;  Surgeon: Jettie Booze, MD;  Location: [facility] [facility];  Service: Cardiovascular;  Laterality: N/A;  SEQ SVG PDA-PL  . LEFT HEART CATH AND CORS/GRAFTS ANGIOGRAPHY N/A 02/15/2019   Procedure: LEFT HEART CATH AND CORS/GRAFTS ANGIOGRAPHY;  Surgeon: Jettie Booze, MD;  Location: [facility];  Service: Cardiovascular;  Laterality: N/A;  . percutaneous transluminal coronary angioplasty     hx   HPI:  [name] is a 86 y.o. M with chronic diastolic and systolic CHF EF 62-37%, hx  CABG, HTN and DM who presented with progressive weakness over several weeks.  Has had numerous falls recently. RN notes indicate Pt "choked" on water this weekend. BSE ordered to determine LRD.   Assessment / Plan / Recommendation Clinical Impression  Clinical Swallowing Eval completed while Pt was seated upright in bed. Pt with overt s/sx of aspiration; Pt consumed thin liquids with immediate and overt coughing and residual wet vocal quality with most presentations of thin liquids via cup and straw. Pt consumed NTL without overt s/sx of aspiration. Pt consumed puree textures without incident; Pt consumed regular textures with mild/mod residue after the swallow. Recommend downgrade Pt's diet to D3/mech soft and NECTAR thick liquids. Recommend meds whole with applesauce. MD is planning for d/c today, if Pt does not d/c today plan to proceed with objective swallow eval MBSS tomorrow am, if Pt d/c today recommend consider OP MBSS and ST f/u at rehab location.  SLP Visit Diagnosis: Dysphagia, unspecified (R13.10)    Aspiration Risk  Mild aspiration risk    Diet Recommendation Dysphagia 3 (Mech soft);Nectar-thick liquid   Liquid Administration via: Cup Medication Administration: Whole meds with puree Supervision: Patient able to self feed;Full supervision/cueing for compensatory strategies;Staff to assist with self feeding Compensations: Minimize environmental distractions;Slow rate;Small sips/bites;Follow solids with liquid Postural Changes: Seated upright at 90 degrees;Remain upright for at least 30 minutes  after po intake    Other  Recommendations Oral Care Recommendations: Oral care BID Other Recommendations: Order thickener from pharmacy   Follow up Recommendations 24 hour supervision/assistance;Skilled Nursing facility      Frequency and Duration min 1 x/week  1 week       Prognosis Prognosis for Safe Diet Advancement: Fair      Swallow Study   General Date of Onset: 09/26/19 HPI:  [name] is a 86 y.o. M with chronic diastolic and systolic CHF EF 57-90%, hx CABG, HTN and DM who presented with progressive weakness over several weeks.  Has had numerous falls recently. RN notes indicate Pt "choked" on water this weekend. BSE ordered to determine LRD. Type of Study: Bedside Swallow Evaluation Previous Swallow Assessment: none in chart Diet Prior to this Study: Regular;Thin liquids Temperature Spikes Noted: No Respiratory Status: Room air History of Recent Intubation: No Behavior/Cognition: Alert;Cooperative;Confused Oral Cavity Assessment: Within Functional Limits Oral Care Completed by SLP: Recent completion by staff Oral Cavity - Dentition: Dentures, top;Dentures, bottom Vision: Functional for self-feeding Self-Feeding Abilities: Able to feed self;Needs assist;Needs set up Patient Positioning: Upright in bed Baseline Vocal Quality: Normal Volitional Cough: Strong Volitional Swallow: Able to elicit    Oral/Motor/Sensory Function Overall Oral Motor/Sensory Function: Within functional limits   Ice Chips Ice chips: Within functional limits Presentation: Cup   Thin Liquid Thin Liquid: Impaired Presentation: Cup;Straw Oral Phase Impairments: Poor awareness of bolus Oral Phase Functional Implications: Left anterior spillage Pharyngeal  Phase Impairments: Suspected delayed Swallow;Multiple swallows;Wet Vocal Quality;Throat Clearing - Delayed;Cough - Immediate    Nectar Thick Nectar Thick Liquid: Within functional limits   Honey Thick Honey Thick Liquid: Not tested   Puree Puree: Within functional limits   Solid        Solid: Impaired Oral Phase Impairments: Poor awareness of bolus;Reduced lingual movement/coordination Oral Phase Functional Implications: Oral holding;Oral residue     Maizie Garno H. Roddie Mc, [place] Speech Language Pathologist  Wende Bushy 10/01/2019,10:30 AM

## 2019-10-01 NOTE — Progress Notes (Signed)
PROGRESS NOTE    [name]  ZOX:[id number] DOB: 04-03-1927 DOA: 09/26/2019 PCP: Glenda Chroman, MD    Brief Narrative:Mr. Meech is a 86 y.o. M with chronic diastolic and systolic CHF EF 81-19%, hx CABG, HTN and DM who presented with progressive weakness over several weeks. Has had numerous falls recently.  In the ER, patient was afebrile, BP and HR and RR normal. WBC normal. Electrolytes and renal function normal. CXR, EKG, head CT unremarkable.  Assessment & Plan:   Active Problems:   SOB (shortness of breath)   Falls frequently   Diarrhea   #1 frequent falls-seen by PT recommends SNF  #2 dehydration secondary to diarrhearesolved  #3 cognitive impairment and sundowning-started on Resporal 0.25 mg at night on 09/28/2019 with citalopram being continued.  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  #5 history of essential hypertension-blood pressure 121/77 softdecrease metoprolol to 12.5 mg daily Hold Lasix  #6 type 2 diabetes on SSI. Restart Metformin on discharge. CBG (last 3)  Recent Labs (last 2 labs)        Recent Labs    09/29/19 2155 09/30/19 0716 09/30/19 1104  GLUCAP 138* 113* 108*     #7 restless leg syndrome on Requip.  #8 leukocytosis new since admission-patient had a episode of coughing while drinking water which happens at home.  Chest x-ray shows no evidence of infiltrates.  Small right pleural effusion.  Cardiomegaly.  Speech therapy consulted.   #9 increased creatinine from the time of admission patient with poor p.o. intake hold Lasix, give 500 cc of normal saline.           #10 goals of care discussed with patient's daughter who is patient's healthcare power of attorney.  Patient is DO NOT RESUSCITATE.  He has a living will.  She is hoping that he can be discharged to [facility]  tomorrow           #18mild  aspiration risk appreciate speech input.  Hopefully he can have the modified barium swallow tomorrow and then discharged to SNF.  Diet change to dysphagia 3 with nectar thick liquid.  Estimated body mass index is 24.81 kg/m as calculated from the following:   Height as of this encounter: 5\' 11"  (1.803 m).   Weight as of this encounter: 80.7 kg.   DVT prophylaxis:Lovenox  code Status:Full code Family Communication:Discussed with patient  disposition Plan:Status is inpatient  Dispo: The patient is from:home Anticipated d/c is to:snf Anticipated d/c date JY:NWGNFAO Patient currentlyis medicallystable for dc   Consultants:none  Procedures:none Antimicrobials:none  Subjective: Resting in bed staff by the bedside patient continues to have coughing with drinking clear liquids speech therapy consulted. Objective: Vitals:   09/30/19 1037 09/30/19 2114 10/01/19 0540 10/01/19 0755  BP: 121/77 138/77 128/84   Pulse: 87 99 (!) 102   Resp:  16 16   Temp:  98.7 F (37.1 C) 98.2 F (36.8 C)   TempSrc:  Oral Oral   SpO2:  95% 94% 96%  Weight:      Height:        Intake/Output Summary (Last 24 hours) at 10/01/2019 1258 Last data filed at 10/01/2019 0900 Gross per 24 hour  Intake 1080 ml  Output 1550 ml  Net -470 ml   Filed Weights   09/26/19 1722 09/27/19 0000  Weight: 86.8 kg 80.7 kg  Examination:  General exam: Appears calm and comfortable  Respiratory system: Clear to auscultation. Respiratory effort normal. Cardiovascular system: S1 & S2 heard, RRR. No JVD, murmurs, rubs, gallops or clicks. No pedal edema. Gastrointestinal system: Abdomen is nondistended, soft and nontender. No organomegaly or masses felt. Normal bowel sounds heard. Central nervous system: Alert and oriented. No focal neurological deficits. Extremities: Symmetric 5 x 5 power. Skin: No rashes, lesions or ulcers Psychiatry:  Judgement and insight appear normal. Mood & affect appropriate.     Data Reviewed: I have personally reviewed following labs and imaging studies  CBC: Recent Labs  Lab 09/26/19 1819 09/29/19 1202 09/30/19 0506  WBC 10.5 16.2* 13.0*  NEUTROABS 8.2*  --   --   HGB 11.9* 12.5* 12.0*  HCT 37.2* 38.3* 37.4*  MCV 95.9 93.9 94.4  PLT 313 367 132   Basic Metabolic Panel: Recent Labs  Lab 09/26/19 1819 09/29/19 1202 09/30/19 0506  NA 141 137 140  K 3.8 3.8 3.7  CL 100 99 102  CO2 27 24 27   GLUCOSE 114* 157* 109*  BUN 12 21 18   CREATININE 1.19 1.39* 1.18  CALCIUM 8.9 8.8* 8.8*   GFR: Estimated Creatinine Clearance: 44.3 mL/min (by C-G formula based on SCr of 1.18 mg/dL). Liver Function Tests: Recent Labs  Lab 09/29/19 1202  AST 24  ALT 14  ALKPHOS 98  BILITOT 2.0*  PROT 7.7  ALBUMIN 4.1   No results for input(s): LIPASE, AMYLASE in the last 168 hours. No results for input(s): AMMONIA in the last 168 hours. Coagulation Profile: No results for input(s): INR, PROTIME in the last 168 hours. Cardiac Enzymes: No results for input(s): CKTOTAL, CKMB, CKMBINDEX, TROPONINI in the last 168 hours. BNP (last 3 results) No results for input(s): PROBNP in the last 8760 hours. HbA1C: No results for input(s): HGBA1C in the last 72 hours. CBG: Recent Labs  Lab 09/30/19 1104 09/30/19 1616 09/30/19 2115 10/01/19 0753 10/01/19 1137  GLUCAP 108* 146* 163* 134* 143*   Lipid Profile: No results for input(s): CHOL, HDL, LDLCALC, TRIG, CHOLHDL, LDLDIRECT in the last 72 hours. Thyroid Function Tests: No results for input(s): TSH, T4TOTAL, FREET4, T3FREE, THYROIDAB in the last 72 hours. Anemia Panel: No results for input(s): VITAMINB12, FOLATE, FERRITIN, TIBC, IRON, RETICCTPCT in the last 72 hours. Sepsis Labs: No results for input(s): PROCALCITON, LATICACIDVEN in the last 168 hours.  Recent Results (from the past 240 hour(s))  SARS Coronavirus 2 by RT PCR (hospital order,  performed in [facility] hospital lab) Nasopharyngeal Nasopharyngeal Swab     Status: None   Collection Time: 09/26/19  6:36 PM   Specimen: Nasopharyngeal Swab  Result Value Ref Range Status   SARS Coronavirus 2 NEGATIVE NEGATIVE Final    Comment: (NOTE) SARS-CoV-2 target nucleic acids are NOT DETECTED. The SARS-CoV-2 RNA is generally detectable in upper and lower respiratory specimens during the acute phase of infection. The lowest concentration of SARS-CoV-2 viral copies this assay can detect is 250 copies / mL. A negative result does not preclude SARS-CoV-2 infection and should not be used as the sole basis for treatment or other patient management decisions.  A negative result may occur with improper specimen collection / handling, submission of specimen other than nasopharyngeal swab, presence of viral mutation(s) within the areas targeted by this assay, and inadequate number of viral copies (<250 copies / mL). A negative result must be combined with clinical observations, patient history, and epidemiological information. Fact Sheet for Patients:   StrictlyIdeas.no Fact  Sheet for Healthcare Providers: BankingDealers.co.za This test is not yet approved or cleared  by the [place] and has been authorized for detection and/or diagnosis of SARS-CoV-2 by FDA under an Emergency Use Authorization (EUA).  This EUA will remain in effect (meaning this test can be used) for the duration of the COVID-19 declaration under Section 564(b)(1) of the Act, 21 U.S.C. section 360bbb-3(b)(1), unless the authorization is terminated or revoked sooner. Performed at [facility], [address], [place], [place] [address]          Radiology Studies: [facility] Chest [facility] 1 View  Result Date: 09/29/2019 CLINICAL DATA:  Altered mental status.  WBC. EXAM: PORTABLE CHEST 1 VIEW COMPARISON:  09/26/2019 FINDINGS: Median sternotomy and CABG. Stable  cardiomegaly. There is small RIGHT pleural effusion, new since the prior study. Minimal subsegmental atelectasis bilaterally. Shallow lung inflation. Remote LEFT humerus ORIF. IMPRESSION: 1. Stable cardiomegaly. 2. New small RIGHT pleural effusion. Electronically Signed   By: Nolon Nations M.D.   On: 09/29/2019 15:54        Scheduled Meds:  aspirin EC  81 mg Oral Daily   atorvastatin  20 mg Oral q morning - 10a   citalopram  20 mg Oral Daily   clopidogrel  75 mg Oral q morning - 10a   enoxaparin (LOVENOX) injection  40 mg Subcutaneous Q24H   insulin aspart  0-5 Units Subcutaneous QHS   insulin aspart  0-9 Units Subcutaneous TID WC   loratadine  10 mg Oral Daily   metoprolol succinate  12.5 mg Oral q morning - 10a   risperiDONE  0.25 mg Oral QHS   rOPINIRole  2 mg Oral QHS   Continuous Infusions:   LOS: 2 days      Georgette Shell, MD 10/01/2019, 12:58 PM

## 2019-10-02 ENCOUNTER — Inpatient Hospital Stay (HOSPITAL_COMMUNITY): Payer: Medicare Other

## 2019-10-02 LAB — GLUCOSE, CAPILLARY
Glucose-Capillary: 143 mg/dL — ABNORMAL HIGH (ref 70–99)
Glucose-Capillary: 259 mg/dL — ABNORMAL HIGH (ref 70–99)

## 2019-10-02 MED ORDER — METOPROLOL SUCCINATE ER 25 MG PO TB24: 12.5000 mg | ORAL_TABLET | Freq: Every morning | ORAL | 1 refills | Status: DC

## 2019-10-02 MED ORDER — FUROSEMIDE 20 MG PO TABS
10.0000 mg | ORAL_TABLET | Freq: Every day | ORAL | 11 refills | Status: DC | PRN
Start: 2019-10-02 — End: 2020-06-30

## 2019-10-02 NOTE — Progress Notes (Signed)
Patient discharging to [facility]. AVS and DNR form sent with discharge packet to facility. Patient picked up by [facility] for transport to facility. Report given to Tiffany, receiving nurse at facility. Patient left floor in stable condition via stretcher with EMS transport.

## 2019-10-02 NOTE — Evaluation (Signed)
  Modified Barium Swallow Progress Note  Patient Details  Name: [name] MRN: [id number] Date of Birth: 15-Sep-1926  Today's Date: 10/02/2019  Modified Barium Swallow completed.  Full report located under Chart Review in the Imaging Section.  Brief recommendations include the following:  Clinical Impression  Pt presents with mild/moderate oropharyngeal dysphagia characterized by varying volumes from trace to gross amounts of silent and sensed aspiration of thin liquids before during and after the swallow resulting from decreased epiglottic deflection, delayed swallowing trigger and decreased laryngeal vestibule closure. Further note increasing episodes of aspiration as the study went on questionably related to fatigue. Pt attempted chin tuck with thin liquid administration which was ineffective in decreasing or eliminating aspiration- further note secondary to Pt's mentation he is unable to consistently complete strategies even with 100% cues/reminders and visual cues. No penetration or aspiration was visualized with trials of NTL however Pt did have mild/mod valleculae and pyriform residue of NTL that was potentially mixed with secretions and eventually aspirated later. Pt demonstrates poor oral control of all presentations and required liquid wash to facilitate and trigger swallow after regular textures; also note consistent anterior spill on the L with most thin presentations.  Pt was unable to generate anterior to posterior movement of barium tablet despite multiple presentations of liquid and puree; tablet was eventually expectorated. Least restrictive diet recommendation: Mechanical soft diet with NECTAR THICK liquids, recommend meds crushed in puree. Pt is at risk for dehydration given Pt report of "hating that thick stuff"   SLP called Sabino Gasser, Pt's daughter to review recommendations; she reports she wants her dad to "have whatever he wants and to be as comfortable as possible" she  further reports he has been "depressed and I don't want to take away one thing he enjoys". SLP reviewed the risks and education that [name] will likely experience aspiration when he consumes thin liquids which could cause PNA, she reports she wants to "sign a waiver to give him whatever he wants" and further reports that she understands the risks.  Swallow Evaluation Recommendations       SLP Diet Recommendations: Dysphagia 3 (Mech soft) solids;Nectar thick liquid   Liquid Administration via: Cup   Medication Administration: Crushed with puree   Supervision: Patient able to self feed;Intermittent supervision to cue for compensatory strategies   Compensations: Minimize environmental distractions;Slow rate;Small sips/bites;Follow solids with liquid   Postural Changes: Remain semi-upright after after feeds/meals (Comment);Seated upright at 90 degrees   Oral Care Recommendations: Oral care BID   Other Recommendations: Order thickener from [place]. Roddie Mc, CCC-SLP Speech Language Pathologist  Wende Bushy 10/02/2019,11:15 AM

## 2019-10-02 NOTE — Discharge Summary (Signed)
Physician Discharge Summary  [name] GHW:[id number] DOB: 03-04-1927 DOA: 09/26/2019  PCP: Glenda Chroman, MD  Admit date: 09/26/2019 Discharge date: 10/02/2019  Admitted From: home [facility] home Recommendations for Outpatient Follow-up:  1. Follow up with PCP in 1-2 weeks 2. Please obtain BMP/CBC in one week  Home Health none Equipment/Devices:none Discharge Condition stable CODE STATUS:dnr Diet recommendation: dysphagia 3 thickened liquids regular Brief/Interim Summary:[name] is a 86 y.o. M with chronic diastolic and systolic CHF EF 78-93%, hx CABG, HTN and DM who presented with progressive weakness over several weeks. Has had numerous falls recently.  In the ER, patient was afebrile, BP and HR and RR normal. WBC normal. Electrolytes and renal function normal. CXR, EKG, head CT unremarkable.  Discharge Diagnoses:  Active Problems:   SOB (shortness of breath)   Falls frequently   Diarrhea   #1 frequent falls-seen by PT recommended SNF  #2 dehydration secondary to diarrhearesolved  #3 cognitive impairment and sundowning-started on Resporal 0.25 mg at night on 09/28/2019. 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  #5 history of essential hypertension-blood pressure 121/77 continue metoprolol  #6 type 2 diabetes continue  Metformin on discharge   #7 restless leg syndrome on Requip.  #8 leukocytosis new since admission-patient had a episode of coughing while drinking water which happens at home.Chest x-ray shows no evidence of infiltrates. Small right pleural effusion. Cardiomegaly. Speech therapy consulted. recommends dysphagia 3 diet  #9 increased creatinine from the time of admission patient with poor p.o. intake Monitor po intake.  #10 goals of care discussed with patient's daughter  who is patient's healthcare power of attorney. Patient is DO NOT RESUSCITATE. He has a living will. She is hoping that he can be discharged to [facility] .           #61mild  aspiration risk appreciate speech input. .  Diet change to dysphagia 3 with nectar thick liquid.  Estimated body mass index is 24.81 kg/m as calculated from the following:   Height as of this encounter: 5\' 11"  (1.803 m).   Weight as of this encounter: 80.7 kg.  Discharge Instructions  Discharge Instructions    Diet - low sodium heart healthy   Complete by: As directed    Increase activity slowly   Complete by: As directed      Allergies as of 10/02/2019      Reactions   Sulfonamide Derivatives Rash   Tetracycline Rash      Medication List    STOP taking these medications   diazepam 5 MG tablet Commonly known as: VALIUM     TAKE these medications   aspirin EC 81 MG tablet Take 1 tablet (81 mg total) by mouth daily.   atorvastatin 20 MG tablet Commonly known as: LIPITOR Take 20 mg by mouth every morning.   cetirizine 10 MG tablet Commonly known as: ZYRTEC Take 10 mg by mouth daily.   citalopram 40 MG tablet Commonly known as: CELEXA Take 0.5 tablets (20 mg total) by mouth daily. Please cut your existing tablets with a pill cutter. If you have any difficulty, let your primary care doctor know.   clopidogrel 75 MG tablet Commonly known as: PLAVIX Take 1 tablet (75 mg total) by mouth daily. What changed: when to take this   furosemide 40 MG tablet Commonly known as: Lasix Take 1 tablet (40 mg  total) by mouth 2 (two) times daily. What changed: when to take this   metFORMIN 500 MG tablet Commonly known as: GLUCOPHAGE Take 500 mg by mouth at bedtime.   metoprolol succinate 25 MG 24 hr tablet Commonly known as: TOPROL-XL Take 1 tablet by mouth once daily What changed: when to take this   multivitamin tablet Take 1 tablet by mouth daily.   nitroGLYCERIN 0.4 MG SL tablet Commonly known  as: NITROSTAT Place 1 tablet (0.4 mg total) under the tongue every 5 (five) minutes as needed for chest pain.   potassium chloride SA 20 MEQ tablet Commonly known as: KLOR-CON Take 1 tablet (20 mEq total) by mouth daily. What changed:   when to take this  reasons to take this   risperiDONE 0.25 MG tablet Commonly known as: RISPERDAL Take 1 tablet (0.25 mg total) by mouth at bedtime.   rOPINIRole 1 MG tablet Commonly known as: REQUIP Take 1-2 mg by mouth See admin instructions. Take 1 tab daily as needed and takes 2 tablets in the evening       Contact information for follow-up providers    Vyas, Dhruv B, MD Follow up.   Specialty: Internal Medicine Contact information: [place] [address] [phone]        Herminio Commons, MD .   Specialty: Cardiology Contact information: [place] [address] [phone]            Contact information for after-discharge care    [facility] .   Service: Skilled Nursing Contact information: [address] [place] [phone]                 Allergies  Allergen Reactions  . Sulfonamide Derivatives Rash  . Tetracycline Rash    Consultations: none  Procedures/Studies: DG Chest 2 View  Result Date: 09/26/2019 CLINICAL DATA:  Shortness of breath headache EXAM: CHEST - 2 VIEW COMPARISON:  04/16/2019, CT 07/09/2019, chest x-ray 09/24/2016 FINDINGS: Post sternotomy changes. No significant pleural effusion. Streaky airspace disease at the left base. Stable cardiomediastinal silhouette. No pneumothorax. Surgical hardware in the proximal left humerus. IMPRESSION: Streaky left basilar opacity, favor atelectasis. Electronically Signed   By: Donavan Foil M.D.   On: 09/26/2019 18:18   CT HEAD WO CONTRAST  Result Date: 09/26/2019 CLINICAL DATA:  Multiple falls with headaches EXAM: CT HEAD WITHOUT CONTRAST TECHNIQUE: Contiguous  axial images were obtained from the base of the skull through the vertex without intravenous contrast. COMPARISON:  07/11/2019 FINDINGS: Brain: No evidence of acute infarction, hemorrhage, hydrocephalus, extra-axial collection or mass lesion/mass effect. Chronic atrophic changes and white matter ischemic changes are noted. Vascular: No hyperdense vessel or unexpected calcification. Skull: Normal. Negative for fracture or focal lesion. Sinuses/Orbits: No acute finding. Other: None. IMPRESSION: Chronic atrophic and ischemic changes stable from the prior exam. No acute abnormality is noted. Electronically Signed   By: Inez Catalina M.D.   On: 09/26/2019 23:11   DG Chest Port 1 View  Result Date: 09/29/2019 CLINICAL DATA:  Altered mental status.  WBC. EXAM: PORTABLE CHEST 1 VIEW COMPARISON:  09/26/2019 FINDINGS: Median sternotomy and CABG. Stable cardiomegaly. There is small RIGHT pleural effusion, new since the prior study. Minimal subsegmental atelectasis bilaterally. Shallow lung inflation. Remote LEFT humerus ORIF. IMPRESSION: 1. Stable cardiomegaly. 2. New small RIGHT pleural effusion. Electronically Signed   By: Nolon Nations M.D.   On: 09/29/2019 15:54    (  Echo, Carotid, EGD, Colonoscopy, ERCP)    Subjective: resting in bed in nad  Discharge Exam: Vitals:   10/01/19 2128 10/02/19 0531  BP: 114/73 128/86  Pulse: 99 (!) 110  Resp: 20 20  Temp: 98.5 F (36.9 C) 98.1 F (36.7 C)  SpO2: 97% 96%   Vitals:   10/01/19 0755 10/01/19 1336 10/01/19 2128 10/02/19 0531  BP:  106/79 114/73 128/86  Pulse:  (!) 117 99 (!) 110  Resp:  19 20 20   Temp:  98.5 F (36.9 C) 98.5 F (36.9 C) 98.1 F (36.7 C)  TempSrc:  Oral Oral Oral  SpO2: 96% 99% 97% 96%  Weight:      Height:        General: Pt is alert, awake, not in acute distress Cardiovascular: RRR, S1/S2 +, no rubs, no gallops Respiratory: CTA bilaterally, no wheezing, no rhonchi Abdominal: Soft, NT, ND, bowel sounds + Extremities: no  edema, no cyanosis    The results of significant diagnostics from this hospitalization (including imaging, microbiology, ancillary and laboratory) are listed below for reference.     Microbiology: Recent Results (from the past 240 hour(s))  SARS Coronavirus 2 by RT PCR (hospital order, performed in [facility] hospital lab) Nasopharyngeal Nasopharyngeal Swab     Status: None   Collection Time: 09/26/19  6:36 PM   Specimen: Nasopharyngeal Swab  Result Value Ref Range Status   SARS Coronavirus 2 NEGATIVE NEGATIVE Final    Comment: (NOTE) SARS-CoV-2 target nucleic acids are NOT DETECTED. The SARS-CoV-2 RNA is generally detectable in upper and lower respiratory specimens during the acute phase of infection. The lowest concentration of SARS-CoV-2 viral copies this assay can detect is 250 copies / mL. A negative result does not preclude SARS-CoV-2 infection and should not be used as the sole basis for treatment or other patient management decisions.  A negative result may occur with improper specimen collection / handling, submission of specimen other than nasopharyngeal swab, presence of viral mutation(s) within the areas targeted by this assay, and inadequate number of viral copies (<250 copies / mL). A negative result must be combined with clinical observations, patient history, and epidemiological information. Fact Sheet for Patients:   StrictlyIdeas.no Fact Sheet for Healthcare Providers: BankingDealers.co.za This test is not yet approved or cleared  by the [place] FDA and has been authorized for detection and/or diagnosis of SARS-CoV-2 by FDA under an Emergency Use Authorization (EUA).  This EUA will remain in effect (meaning this test can be used) for the duration of the COVID-19 declaration under Section 564(b)(1) of the Act, 21 U.S.C. section 360bbb-3(b)(1), unless the authorization is terminated or revoked sooner. Performed  at [facility], [address], [place], [place] [address]   SARS Coronavirus 2 by RT PCR (hospital order, performed in [facility] hospital lab) Nasopharyngeal Nasopharyngeal Swab     Status: None   Collection Time: 10/01/19  3:22 PM   Specimen: Nasopharyngeal Swab  Result Value Ref Range Status   SARS Coronavirus 2 NEGATIVE NEGATIVE Final    Comment: (NOTE) SARS-CoV-2 target nucleic acids are NOT DETECTED. The SARS-CoV-2 RNA is generally detectable in upper and lower respiratory specimens during the acute phase of infection. The lowest concentration of SARS-CoV-2 viral copies this assay can detect is 250 copies / mL. A negative result does not preclude SARS-CoV-2 infection and should not be used as the sole basis for treatment or other patient management decisions.  A negative result may occur with improper specimen collection /  handling, submission of specimen other than nasopharyngeal swab, presence of viral mutation(s) within the areas targeted by this assay, and inadequate number of viral copies (<250 copies / mL). A negative result must be combined with clinical observations, patient history, and epidemiological information. Fact Sheet for Patients:   StrictlyIdeas.no Fact Sheet for Healthcare Providers: BankingDealers.co.za This test is not yet approved or cleared  by the [place] FDA and has been authorized for detection and/or diagnosis of SARS-CoV-2 by FDA under an Emergency Use Authorization (EUA).  This EUA will remain in effect (meaning this test can be used) for the duration of the COVID-19 declaration under Section 564(b)(1) of the Act, 21 U.S.C. section 360bbb-3(b)(1), unless the authorization is terminated or revoked sooner. Performed at [facility], [address] [place], [place] [address]      Labs: BNP (last 3 results) Recent Labs    04/16/19 1525 09/26/19 1819  BNP 184.0* 209.4*   Basic Metabolic  Panel: Recent Labs  Lab 09/26/19 1819 09/29/19 1202 09/30/19 0506  NA 141 137 140  K 3.8 3.8 3.7  CL 100 99 102  CO2 27 24 27   GLUCOSE 114* 157* 109*  BUN 12 21 18   CREATININE 1.19 1.39* 1.18  CALCIUM 8.9 8.8* 8.8*   Liver Function Tests: Recent Labs  Lab 09/29/19 1202  AST 24  ALT 14  ALKPHOS 98  BILITOT 2.0*  PROT 7.7  ALBUMIN 4.1   No results for input(s): LIPASE, AMYLASE in the last 168 hours. No results for input(s): AMMONIA in the last 168 hours. CBC: Recent Labs  Lab 09/26/19 1819 09/29/19 1202 09/30/19 0506  WBC 10.5 16.2* 13.0*  NEUTROABS 8.2*  --   --   HGB 11.9* 12.5* 12.0*  HCT 37.2* 38.3* 37.4*  MCV 95.9 93.9 94.4  PLT 313 367 303   Cardiac Enzymes: No results for input(s): CKTOTAL, CKMB, CKMBINDEX, TROPONINI in the last 168 hours. BNP: Invalid input(s): POCBNP CBG: Recent Labs  Lab 10/01/19 0753 10/01/19 1137 10/01/19 1605 10/01/19 2125 10/02/19 0713  GLUCAP 134* 143* 184* 151* 143*   D-Dimer No results for input(s): DDIMER in the last 72 hours. Hgb A1c No results for input(s): HGBA1C in the last 72 hours. Lipid Profile No results for input(s): CHOL, HDL, LDLCALC, TRIG, CHOLHDL, LDLDIRECT in the last 72 hours. Thyroid function studies No results for input(s): TSH, T4TOTAL, T3FREE, THYROIDAB in the last 72 hours.  Invalid input(s): FREET3 Anemia work up No results for input(s): VITAMINB12, FOLATE, FERRITIN, TIBC, IRON, RETICCTPCT in the last 72 hours. Urinalysis    Component Value Date/Time   COLORURINE STRAW (A) 09/26/2019 1836   APPEARANCEUR CLEAR 09/26/2019 1836   LABSPEC 1.005 09/26/2019 1836   PHURINE 5.0 09/26/2019 1836   GLUCOSEU NEGATIVE 09/26/2019 1836   HGBUR NEGATIVE 09/26/2019 1836   BILIRUBINUR NEGATIVE 09/26/2019 1836   KETONESUR NEGATIVE 09/26/2019 1836   PROTEINUR NEGATIVE 09/26/2019 1836   NITRITE NEGATIVE 09/26/2019 1836   LEUKOCYTESUR NEGATIVE 09/26/2019 1836   Sepsis Labs Invalid input(s):  PROCALCITONIN,  WBC,  LACTICIDVEN Microbiology Recent Results (from the past 240 hour(s))  SARS Coronavirus 2 by RT PCR (hospital order, performed in [facility] hospital lab) Nasopharyngeal Nasopharyngeal Swab     Status: None   Collection Time: 09/26/19  6:36 PM   Specimen: Nasopharyngeal Swab  Result Value Ref Range Status   SARS Coronavirus 2 NEGATIVE NEGATIVE Final    Comment: (NOTE) SARS-CoV-2 target nucleic acids are NOT DETECTED. The SARS-CoV-2 RNA is generally detectable in  upper and lower respiratory specimens during the acute phase of infection. The lowest concentration of SARS-CoV-2 viral copies this assay can detect is 250 copies / mL. A negative result does not preclude SARS-CoV-2 infection and should not be used as the sole basis for treatment or other patient management decisions.  A negative result may occur with improper specimen collection / handling, submission of specimen other than nasopharyngeal swab, presence of viral mutation(s) within the areas targeted by this assay, and inadequate number of viral copies (<250 copies / mL). A negative result must be combined with clinical observations, patient history, and epidemiological information. Fact Sheet for Patients:   StrictlyIdeas.no Fact Sheet for Healthcare Providers: BankingDealers.co.za This test is not yet approved or cleared  by the [place] FDA and has been authorized for detection and/or diagnosis of SARS-CoV-2 by FDA under an Emergency Use Authorization (EUA).  This EUA will remain in effect (meaning this test can be used) for the duration of the COVID-19 declaration under Section 564(b)(1) of the Act, 21 U.S.C. section 360bbb-3(b)(1), unless the authorization is terminated or revoked sooner. Performed at [facility], [address], [place], [place] [address]   SARS Coronavirus 2 by RT PCR (hospital order, performed in [facility] hospital lab)  Nasopharyngeal Nasopharyngeal Swab     Status: None   Collection Time: 10/01/19  3:22 PM   Specimen: Nasopharyngeal Swab  Result Value Ref Range Status   SARS Coronavirus 2 NEGATIVE NEGATIVE Final    Comment: (NOTE) SARS-CoV-2 target nucleic acids are NOT DETECTED. The SARS-CoV-2 RNA is generally detectable in upper and lower respiratory specimens during the acute phase of infection. The lowest concentration of SARS-CoV-2 viral copies this assay can detect is 250 copies / mL. A negative result does not preclude SARS-CoV-2 infection and should not be used as the sole basis for treatment or other patient management decisions.  A negative result may occur with improper specimen collection / handling, submission of specimen other than nasopharyngeal swab, presence of viral mutation(s) within the areas targeted by this assay, and inadequate number of viral copies (<250 copies / mL). A negative result must be combined with clinical observations, patient history, and epidemiological information. Fact Sheet for Patients:   StrictlyIdeas.no Fact Sheet for Healthcare Providers: BankingDealers.co.za This test is not yet approved or cleared  by the [place] FDA and has been authorized for detection and/or diagnosis of SARS-CoV-2 by FDA under an Emergency Use Authorization (EUA).  This EUA will remain in effect (meaning this test can be used) for the duration of the COVID-19 declaration under Section 564(b)(1) of the Act, 21 U.S.C. section 360bbb-3(b)(1), unless the authorization is terminated or revoked sooner. Performed at [facility], [address] [place], [place] [address]      Time coordinating discharge:  39 minutes  SIGNED:   Georgette Shell, MD  [facility] 10/02/2019, 10:56 AM Pager   If 7PM-7AM, please contact night-coverage [email or web address] Password [email or web address]

## 2019-10-02 NOTE — TOC Transition Note (Signed)
Transition of Care [facility]) - CM/SW Discharge Note  Patient Details  Name: [name] MRN: [id number] Date of Birth: 1926-11-07  [facility] of [facility]) CM/SW Contact:  Sherie Don, LCSW Phone Number: 10/02/2019, 11:50 AM  Clinical Narrative: CSW spoke with Tammi at [facility] to confirm patient will be admitted today. CSW faxed FL2, PT evaluation, discharge summary, and discharge orders to [facility]. CSW called EMS to set up transportation. Medical Necessity Transportation form completed and provided to 300 RN station. Number to call for report is [phone]. Patient's RN, Caryl Pina, updated. CSW called patient's daughter, Radwan Cowley, to update her of the discharge. Daughter reported she will drop off patient's clothes to [facility] today. TOC signing off.  Final next level of care: Skilled Nursing Facility Barriers to Discharge: Barriers Resolved  Patient Goals and CMS Choice Patient states their goals for this hospitalization and ongoing recovery are:: Discharge to SNF and transition to LTC CMS Medicare.gov Compare Post Acute Care list provided to:: Patient Represenative (must comment) Choice offered to / list presented to : Adult Children  Discharge Placement PASRR number recieved: 09/27/19         Patient chooses bed at: [facility] Patient to be transferred to facility by: [facility] Name of family member notified: Davis Ambrosini (daughter) [facility]: [phone] Patient and family notified of of transfer: 10/02/19  Discharge Plan and Services In-house Referral: Clinical Social Work Post Acute Care Choice: [place]          DME Arranged: N/A DME Agency: NA HH Arranged: NA [place] Agency: NA  Readmission Risk Interventions No flowsheet data found.

## 2019-10-02 NOTE — Progress Notes (Signed)
Physical Therapy Treatment Patient Details Name: [name] MRN: [id number] DOB: 04-27-1926 Today's Date: 10/02/2019    History of Present Illness [name] is a 86 y.o. male with medical history significant for systolic and diastolic CHF, CABG, aortic insufficiency mitral regurgitation, hypertension, diabetes mellitus.Patient was brought to the ED with reports of multiple falls in the past few weeks.  Per triage notes, 17 falls in the past 2 weeks.  Patient reports dizziness when standing up, generalized weakness also.  He tells me today he started having difficulty breathing with activity.Reports he has been having multiple loose stools for several weeks, but reports just 1 loose stool yesterday.  No vomiting, no abdominal pain.  No chest pain.  No cough.  Swelling in his lower extremities.  Reports poor p.o. intake.    PT Comments    Pt requires min assist to come to sitting at EOB with increased time, cues for hand placement and sequencing to improve technique. Min assist to rise with RW, cues for hand placement on bed to power up, unsteadiness upon standing requiring cues to bring weight forward and for thoracic extension. Pt tolerates short ambulation over to transport bed to go for swallow study, but requires many cues for RW maneuvering to safely turn and maintain body within RW frame. Pt pleasantly confused throughout session, but therapist continues to reorient pt to hospital and therapy session with good response. Patient will benefit from continued physical therapy in hospital and recommendations below to increase strength, balance, endurance for safe ADLs and gait.   Follow Up Recommendations  SNF;Supervision for mobility/OOB;Supervision - Intermittent     Equipment Recommendations  None recommended by PT    Recommendations for Other Services       Precautions / Restrictions Precautions Precautions: Fall Restrictions Weight Bearing Restrictions: No    Mobility  Bed Mobility Overal bed mobility: Needs Assistance Bed Mobility: Supine to Sit  Supine to sit: Min assist;HOB elevated  General bed mobility comments: slow, labored movement with min assist to fully upright trunk and scoot to EOB  Transfers Overall transfer level: Needs assistance Equipment used: Rolling walker (2 wheeled) Transfers: Sit to/from [facility] Sit to Stand: Min assist Stand pivot transfers: Min assist  General transfer comment: cues to push from bed to power up, min assist to steady once standing and coordinate pivoting over to seated surface  Ambulation/Gait Ambulation/Gait assistance: Min assist Gait Distance (Feet): 15 Feet Assistive device: Rolling walker (2 wheeled) Gait Pattern/deviations: Step-to pattern;Decreased stride length;Shuffle;Trunk flexed Gait velocity: decreased   General Gait Details: slow, shuffling steps requiring min assist to steady, cues for maneuvering RW with turns and to maintain body within [employer]             Wheelchair Mobility    Modified Rankin (Stroke Patients Only)       Balance Overall balance assessment: Needs assistance Sitting-balance support: Feet supported;No upper extremity supported Sitting balance-Leahy Scale: Good Sitting balance - Comments: seated at EOB   Standing balance support: During functional activity;Bilateral upper extremity supported Standing balance-Leahy Scale: Poor Standing balance comment: reliant on RW           Cognition Arousal/Alertness: Awake/alert Behavior During Therapy: WFL for tasks assessed/performed Overall Cognitive Status: Within Functional Limits for tasks assessed        General Comments: requires increased time to follow one step commands, pleasantly confused about being in hospital      Exercises      General  Comments        Pertinent Vitals/Pain Pain Assessment: No/denies pain    Home Living                      Prior  Function            PT Goals (current goals can now be found in the care plan section) Acute Rehab PT Goals Patient Stated Goal: return home with family to assist PT Goal Formulation: With patient Time For Goal Achievement: 10/11/19 Potential to Achieve Goals: Good Progress towards PT goals: Progressing toward goals    Frequency    Min 3X/week      PT Plan Current plan remains appropriate    Co-evaluation              AM-PAC PT "6 Clicks" Mobility   Outcome Measure  Help needed turning from your back to your side while in a flat bed without using bedrails?: None Help needed moving from lying on your back to sitting on the side of a flat bed without using bedrails?: A Little Help needed moving to and from a bed to a chair (including a wheelchair)?: A Little Help needed standing up from a chair using your arms (e.g., wheelchair or bedside chair)?: A Little Help needed to walk in hospital room?: A Little Help needed climbing 3-5 steps with a railing? : A Lot 6 Click Score: 18    End of Session   Activity Tolerance: Patient tolerated treatment well;Patient limited by fatigue Patient left: Other (comment)(with transport team to go to swallow study) Nurse Communication: Mobility status PT Visit Diagnosis: Unsteadiness on feet (R26.81);Other abnormalities of gait and mobility (R26.89);Muscle weakness (generalized) (M62.81)     Time: 2703-5009 PT Time Calculation (min) (ACUTE ONLY): 11 min  Charges:  $Therapeutic Activity: 8-22 mins                      Tori Danialle Dement PT, DPT 10/02/19, 11:18 AM [phone]

## 2019-10-04 DIAGNOSIS — E119 Type 2 diabetes mellitus without complications: Secondary | ICD-10-CM | POA: Diagnosis not present

## 2019-10-04 DIAGNOSIS — E785 Hyperlipidemia, unspecified: Secondary | ICD-10-CM | POA: Diagnosis not present

## 2019-10-04 DIAGNOSIS — I509 Heart failure, unspecified: Secondary | ICD-10-CM | POA: Diagnosis not present

## 2019-10-04 DIAGNOSIS — I10 Essential (primary) hypertension: Secondary | ICD-10-CM | POA: Diagnosis not present

## 2019-10-25 DIAGNOSIS — I4891 Unspecified atrial fibrillation: Secondary | ICD-10-CM | POA: Diagnosis not present

## 2019-10-25 DIAGNOSIS — F419 Anxiety disorder, unspecified: Secondary | ICD-10-CM | POA: Diagnosis not present

## 2019-10-25 DIAGNOSIS — R0602 Shortness of breath: Secondary | ICD-10-CM | POA: Diagnosis not present

## 2019-10-25 DIAGNOSIS — E7212 Methylenetetrahydrofolate reductase deficiency: Secondary | ICD-10-CM | POA: Diagnosis not present

## 2019-10-25 DIAGNOSIS — G4700 Insomnia, unspecified: Secondary | ICD-10-CM | POA: Diagnosis not present

## 2019-10-25 DIAGNOSIS — J99 Respiratory disorders in diseases classified elsewhere: Secondary | ICD-10-CM | POA: Diagnosis not present

## 2019-10-25 DIAGNOSIS — I5042 Chronic combined systolic (congestive) and diastolic (congestive) heart failure: Secondary | ICD-10-CM | POA: Diagnosis not present

## 2019-10-25 DIAGNOSIS — N183 Chronic kidney disease, stage 3 unspecified: Secondary | ICD-10-CM | POA: Diagnosis not present

## 2019-10-25 DIAGNOSIS — F339 Major depressive disorder, recurrent, unspecified: Secondary | ICD-10-CM | POA: Diagnosis not present

## 2019-10-25 DIAGNOSIS — F0391 Unspecified dementia with behavioral disturbance: Secondary | ICD-10-CM | POA: Diagnosis not present

## 2019-10-25 DIAGNOSIS — R1310 Dysphagia, unspecified: Secondary | ICD-10-CM | POA: Diagnosis not present

## 2019-10-25 DIAGNOSIS — M79604 Pain in right leg: Secondary | ICD-10-CM | POA: Diagnosis not present

## 2019-10-25 DIAGNOSIS — I2510 Atherosclerotic heart disease of native coronary artery without angina pectoris: Secondary | ICD-10-CM | POA: Diagnosis not present

## 2019-10-25 DIAGNOSIS — E1159 Type 2 diabetes mellitus with other circulatory complications: Secondary | ICD-10-CM | POA: Diagnosis not present

## 2019-10-25 DIAGNOSIS — Z760 Encounter for issue of repeat prescription: Secondary | ICD-10-CM | POA: Diagnosis not present

## 2019-10-25 DIAGNOSIS — R262 Difficulty in walking, not elsewhere classified: Secondary | ICD-10-CM | POA: Diagnosis not present

## 2019-10-25 DIAGNOSIS — N401 Enlarged prostate with lower urinary tract symptoms: Secondary | ICD-10-CM | POA: Diagnosis not present

## 2019-10-25 DIAGNOSIS — R1312 Dysphagia, oropharyngeal phase: Secondary | ICD-10-CM | POA: Diagnosis not present

## 2019-10-25 DIAGNOSIS — I509 Heart failure, unspecified: Secondary | ICD-10-CM | POA: Diagnosis not present

## 2019-10-25 DIAGNOSIS — Z9181 History of falling: Secondary | ICD-10-CM | POA: Diagnosis not present

## 2019-10-25 DIAGNOSIS — M6281 Muscle weakness (generalized): Secondary | ICD-10-CM | POA: Diagnosis not present

## 2019-10-25 DIAGNOSIS — G2581 Restless legs syndrome: Secondary | ICD-10-CM | POA: Diagnosis not present

## 2019-10-25 DIAGNOSIS — M1990 Unspecified osteoarthritis, unspecified site: Secondary | ICD-10-CM | POA: Diagnosis not present

## 2019-10-25 DIAGNOSIS — I739 Peripheral vascular disease, unspecified: Secondary | ICD-10-CM | POA: Diagnosis not present

## 2019-10-25 DIAGNOSIS — J302 Other seasonal allergic rhinitis: Secondary | ICD-10-CM | POA: Diagnosis not present

## 2019-10-25 DIAGNOSIS — J9811 Atelectasis: Secondary | ICD-10-CM | POA: Diagnosis not present

## 2019-10-25 DIAGNOSIS — I517 Cardiomegaly: Secondary | ICD-10-CM | POA: Diagnosis not present

## 2019-10-25 DIAGNOSIS — I25810 Atherosclerosis of coronary artery bypass graft(s) without angina pectoris: Secondary | ICD-10-CM | POA: Diagnosis not present

## 2019-10-25 DIAGNOSIS — I10 Essential (primary) hypertension: Secondary | ICD-10-CM | POA: Diagnosis not present

## 2019-10-25 DIAGNOSIS — N400 Enlarged prostate without lower urinary tract symptoms: Secondary | ICD-10-CM | POA: Diagnosis not present

## 2019-10-25 DIAGNOSIS — R296 Repeated falls: Secondary | ICD-10-CM | POA: Diagnosis not present

## 2019-10-25 DIAGNOSIS — R5381 Other malaise: Secondary | ICD-10-CM | POA: Diagnosis not present

## 2019-10-25 DIAGNOSIS — F329 Major depressive disorder, single episode, unspecified: Secondary | ICD-10-CM | POA: Diagnosis not present

## 2019-10-25 DIAGNOSIS — Z741 Need for assistance with personal care: Secondary | ICD-10-CM | POA: Diagnosis not present

## 2019-10-25 DIAGNOSIS — M79605 Pain in left leg: Secondary | ICD-10-CM | POA: Diagnosis not present

## 2019-10-25 DIAGNOSIS — I340 Nonrheumatic mitral (valve) insufficiency: Secondary | ICD-10-CM | POA: Diagnosis not present

## 2019-10-25 DIAGNOSIS — J309 Allergic rhinitis, unspecified: Secondary | ICD-10-CM | POA: Diagnosis not present

## 2019-10-25 DIAGNOSIS — E119 Type 2 diabetes mellitus without complications: Secondary | ICD-10-CM | POA: Diagnosis not present

## 2019-10-25 DIAGNOSIS — E785 Hyperlipidemia, unspecified: Secondary | ICD-10-CM | POA: Diagnosis not present

## 2019-10-27 DIAGNOSIS — E119 Type 2 diabetes mellitus without complications: Secondary | ICD-10-CM | POA: Diagnosis not present

## 2019-10-27 DIAGNOSIS — I10 Essential (primary) hypertension: Secondary | ICD-10-CM | POA: Diagnosis not present

## 2019-10-27 DIAGNOSIS — R5381 Other malaise: Secondary | ICD-10-CM | POA: Diagnosis not present

## 2019-10-27 DIAGNOSIS — E785 Hyperlipidemia, unspecified: Secondary | ICD-10-CM | POA: Diagnosis not present

## 2019-11-28 DIAGNOSIS — G4700 Insomnia, unspecified: Secondary | ICD-10-CM | POA: Diagnosis not present

## 2019-11-28 DIAGNOSIS — N400 Enlarged prostate without lower urinary tract symptoms: Secondary | ICD-10-CM | POA: Diagnosis not present

## 2019-11-28 DIAGNOSIS — M79605 Pain in left leg: Secondary | ICD-10-CM | POA: Diagnosis not present

## 2019-11-28 DIAGNOSIS — M79604 Pain in right leg: Secondary | ICD-10-CM | POA: Diagnosis not present

## 2019-11-29 DIAGNOSIS — N400 Enlarged prostate without lower urinary tract symptoms: Secondary | ICD-10-CM | POA: Diagnosis not present

## 2019-11-29 DIAGNOSIS — J302 Other seasonal allergic rhinitis: Secondary | ICD-10-CM | POA: Diagnosis not present

## 2019-11-29 DIAGNOSIS — I509 Heart failure, unspecified: Secondary | ICD-10-CM | POA: Diagnosis not present

## 2019-11-29 DIAGNOSIS — M1990 Unspecified osteoarthritis, unspecified site: Secondary | ICD-10-CM | POA: Diagnosis not present

## 2019-11-29 DIAGNOSIS — E785 Hyperlipidemia, unspecified: Secondary | ICD-10-CM | POA: Diagnosis not present

## 2019-11-29 DIAGNOSIS — E119 Type 2 diabetes mellitus without complications: Secondary | ICD-10-CM | POA: Diagnosis not present

## 2019-12-06 DIAGNOSIS — R0602 Shortness of breath: Secondary | ICD-10-CM | POA: Diagnosis not present

## 2019-12-17 DIAGNOSIS — F419 Anxiety disorder, unspecified: Secondary | ICD-10-CM | POA: Diagnosis not present

## 2019-12-17 DIAGNOSIS — F0391 Unspecified dementia with behavioral disturbance: Secondary | ICD-10-CM | POA: Diagnosis not present

## 2019-12-17 DIAGNOSIS — F329 Major depressive disorder, single episode, unspecified: Secondary | ICD-10-CM | POA: Diagnosis not present

## 2019-12-18 DIAGNOSIS — J309 Allergic rhinitis, unspecified: Secondary | ICD-10-CM | POA: Diagnosis not present

## 2019-12-18 DIAGNOSIS — G4700 Insomnia, unspecified: Secondary | ICD-10-CM | POA: Diagnosis not present

## 2019-12-19 DIAGNOSIS — E1159 Type 2 diabetes mellitus with other circulatory complications: Secondary | ICD-10-CM | POA: Diagnosis not present

## 2019-12-24 ENCOUNTER — Telehealth: Payer: Medicare Other | Admitting: Cardiovascular Disease

## 2019-12-24 ENCOUNTER — Telehealth: Payer: Medicare Other | Admitting: Family Medicine

## 2019-12-27 DIAGNOSIS — I509 Heart failure, unspecified: Secondary | ICD-10-CM | POA: Diagnosis not present

## 2019-12-27 DIAGNOSIS — I10 Essential (primary) hypertension: Secondary | ICD-10-CM | POA: Diagnosis not present

## 2019-12-27 DIAGNOSIS — E785 Hyperlipidemia, unspecified: Secondary | ICD-10-CM | POA: Diagnosis not present

## 2019-12-27 DIAGNOSIS — E119 Type 2 diabetes mellitus without complications: Secondary | ICD-10-CM | POA: Diagnosis not present

## 2019-12-27 DIAGNOSIS — J309 Allergic rhinitis, unspecified: Secondary | ICD-10-CM | POA: Diagnosis not present

## 2020-01-03 DIAGNOSIS — F5101 Primary insomnia: Secondary | ICD-10-CM | POA: Diagnosis not present

## 2020-01-03 DIAGNOSIS — F419 Anxiety disorder, unspecified: Secondary | ICD-10-CM | POA: Diagnosis not present

## 2020-01-03 DIAGNOSIS — F0391 Unspecified dementia with behavioral disturbance: Secondary | ICD-10-CM | POA: Diagnosis not present

## 2020-01-15 DIAGNOSIS — F4320 Adjustment disorder, unspecified: Secondary | ICD-10-CM | POA: Diagnosis not present

## 2020-01-18 DIAGNOSIS — E119 Type 2 diabetes mellitus without complications: Secondary | ICD-10-CM | POA: Diagnosis not present

## 2020-01-18 DIAGNOSIS — I2510 Atherosclerotic heart disease of native coronary artery without angina pectoris: Secondary | ICD-10-CM | POA: Diagnosis not present

## 2020-01-18 DIAGNOSIS — I5040 Unspecified combined systolic (congestive) and diastolic (congestive) heart failure: Secondary | ICD-10-CM | POA: Diagnosis not present

## 2020-01-18 DIAGNOSIS — F0391 Unspecified dementia with behavioral disturbance: Secondary | ICD-10-CM | POA: Diagnosis not present

## 2020-01-18 DIAGNOSIS — F419 Anxiety disorder, unspecified: Secondary | ICD-10-CM | POA: Diagnosis not present

## 2020-01-18 DIAGNOSIS — I10 Essential (primary) hypertension: Secondary | ICD-10-CM | POA: Diagnosis not present

## 2020-01-18 DIAGNOSIS — E785 Hyperlipidemia, unspecified: Secondary | ICD-10-CM | POA: Diagnosis not present

## 2020-01-18 DIAGNOSIS — F5101 Primary insomnia: Secondary | ICD-10-CM | POA: Diagnosis not present

## 2020-01-20 DIAGNOSIS — R609 Edema, unspecified: Secondary | ICD-10-CM | POA: Diagnosis not present

## 2020-01-22 DIAGNOSIS — E119 Type 2 diabetes mellitus without complications: Secondary | ICD-10-CM | POA: Diagnosis not present

## 2020-01-22 DIAGNOSIS — E038 Other specified hypothyroidism: Secondary | ICD-10-CM | POA: Diagnosis not present

## 2020-01-22 DIAGNOSIS — Z79899 Other long term (current) drug therapy: Secondary | ICD-10-CM | POA: Diagnosis not present

## 2020-01-22 DIAGNOSIS — E7849 Other hyperlipidemia: Secondary | ICD-10-CM | POA: Diagnosis not present

## 2020-01-22 DIAGNOSIS — E559 Vitamin D deficiency, unspecified: Secondary | ICD-10-CM | POA: Diagnosis not present

## 2020-01-22 DIAGNOSIS — D518 Other vitamin B12 deficiency anemias: Secondary | ICD-10-CM | POA: Diagnosis not present

## 2020-01-29 DIAGNOSIS — F4320 Adjustment disorder, unspecified: Secondary | ICD-10-CM | POA: Diagnosis not present

## 2020-01-29 DIAGNOSIS — F419 Anxiety disorder, unspecified: Secondary | ICD-10-CM | POA: Diagnosis not present

## 2020-01-30 DIAGNOSIS — R0602 Shortness of breath: Secondary | ICD-10-CM | POA: Diagnosis not present

## 2020-01-31 DIAGNOSIS — F5101 Primary insomnia: Secondary | ICD-10-CM | POA: Diagnosis not present

## 2020-01-31 DIAGNOSIS — F0391 Unspecified dementia with behavioral disturbance: Secondary | ICD-10-CM | POA: Diagnosis not present

## 2020-01-31 DIAGNOSIS — F419 Anxiety disorder, unspecified: Secondary | ICD-10-CM | POA: Diagnosis not present

## 2020-02-04 DIAGNOSIS — R918 Other nonspecific abnormal finding of lung field: Secondary | ICD-10-CM | POA: Diagnosis not present

## 2020-02-12 DIAGNOSIS — J189 Pneumonia, unspecified organism: Secondary | ICD-10-CM | POA: Diagnosis not present

## 2020-02-12 DIAGNOSIS — M1990 Unspecified osteoarthritis, unspecified site: Secondary | ICD-10-CM | POA: Diagnosis not present

## 2020-02-12 DIAGNOSIS — F4320 Adjustment disorder, unspecified: Secondary | ICD-10-CM | POA: Diagnosis not present

## 2020-02-12 DIAGNOSIS — E7849 Other hyperlipidemia: Secondary | ICD-10-CM | POA: Diagnosis not present

## 2020-02-12 DIAGNOSIS — N400 Enlarged prostate without lower urinary tract symptoms: Secondary | ICD-10-CM | POA: Diagnosis not present

## 2020-02-12 DIAGNOSIS — E785 Hyperlipidemia, unspecified: Secondary | ICD-10-CM | POA: Diagnosis not present

## 2020-02-12 DIAGNOSIS — E119 Type 2 diabetes mellitus without complications: Secondary | ICD-10-CM | POA: Diagnosis not present

## 2020-02-12 DIAGNOSIS — Z79899 Other long term (current) drug therapy: Secondary | ICD-10-CM | POA: Diagnosis not present

## 2020-02-12 DIAGNOSIS — F419 Anxiety disorder, unspecified: Secondary | ICD-10-CM | POA: Diagnosis not present

## 2020-02-12 DIAGNOSIS — D518 Other vitamin B12 deficiency anemias: Secondary | ICD-10-CM | POA: Diagnosis not present

## 2020-02-14 DIAGNOSIS — R0602 Shortness of breath: Secondary | ICD-10-CM | POA: Diagnosis not present

## 2020-02-15 DIAGNOSIS — E119 Type 2 diabetes mellitus without complications: Secondary | ICD-10-CM | POA: Diagnosis not present

## 2020-02-15 DIAGNOSIS — M216X2 Other acquired deformities of left foot: Secondary | ICD-10-CM | POA: Diagnosis not present

## 2020-02-15 DIAGNOSIS — B351 Tinea unguium: Secondary | ICD-10-CM | POA: Diagnosis not present

## 2020-03-05 DIAGNOSIS — F419 Anxiety disorder, unspecified: Secondary | ICD-10-CM | POA: Diagnosis not present

## 2020-03-05 DIAGNOSIS — F0391 Unspecified dementia with behavioral disturbance: Secondary | ICD-10-CM | POA: Diagnosis not present

## 2020-03-05 DIAGNOSIS — F5101 Primary insomnia: Secondary | ICD-10-CM | POA: Diagnosis not present

## 2020-03-11 DIAGNOSIS — I10 Essential (primary) hypertension: Secondary | ICD-10-CM | POA: Diagnosis not present

## 2020-03-11 DIAGNOSIS — E119 Type 2 diabetes mellitus without complications: Secondary | ICD-10-CM | POA: Diagnosis not present

## 2020-03-11 DIAGNOSIS — M1990 Unspecified osteoarthritis, unspecified site: Secondary | ICD-10-CM | POA: Diagnosis not present

## 2020-03-11 DIAGNOSIS — E785 Hyperlipidemia, unspecified: Secondary | ICD-10-CM | POA: Diagnosis not present

## 2020-03-21 DIAGNOSIS — R278 Other lack of coordination: Secondary | ICD-10-CM | POA: Diagnosis not present

## 2020-03-21 DIAGNOSIS — M6281 Muscle weakness (generalized): Secondary | ICD-10-CM | POA: Diagnosis not present

## 2020-03-21 DIAGNOSIS — R296 Repeated falls: Secondary | ICD-10-CM | POA: Diagnosis not present

## 2020-03-21 DIAGNOSIS — I5020 Unspecified systolic (congestive) heart failure: Secondary | ICD-10-CM | POA: Diagnosis not present

## 2020-03-21 DIAGNOSIS — I10 Essential (primary) hypertension: Secondary | ICD-10-CM | POA: Diagnosis not present

## 2020-03-21 DIAGNOSIS — R262 Difficulty in walking, not elsewhere classified: Secondary | ICD-10-CM | POA: Diagnosis not present

## 2020-03-24 DIAGNOSIS — R296 Repeated falls: Secondary | ICD-10-CM | POA: Diagnosis not present

## 2020-03-24 DIAGNOSIS — I5020 Unspecified systolic (congestive) heart failure: Secondary | ICD-10-CM | POA: Diagnosis not present

## 2020-03-24 DIAGNOSIS — R262 Difficulty in walking, not elsewhere classified: Secondary | ICD-10-CM | POA: Diagnosis not present

## 2020-03-24 DIAGNOSIS — R278 Other lack of coordination: Secondary | ICD-10-CM | POA: Diagnosis not present

## 2020-03-24 DIAGNOSIS — I10 Essential (primary) hypertension: Secondary | ICD-10-CM | POA: Diagnosis not present

## 2020-03-24 DIAGNOSIS — M6281 Muscle weakness (generalized): Secondary | ICD-10-CM | POA: Diagnosis not present

## 2020-03-25 ENCOUNTER — Telehealth: Payer: Self-pay | Admitting: Family Medicine

## 2020-03-25 DIAGNOSIS — R0600 Dyspnea, unspecified: Secondary | ICD-10-CM | POA: Diagnosis not present

## 2020-03-25 DIAGNOSIS — I5020 Unspecified systolic (congestive) heart failure: Secondary | ICD-10-CM | POA: Diagnosis not present

## 2020-03-25 DIAGNOSIS — F4320 Adjustment disorder, unspecified: Secondary | ICD-10-CM | POA: Diagnosis not present

## 2020-03-25 DIAGNOSIS — E785 Hyperlipidemia, unspecified: Secondary | ICD-10-CM | POA: Diagnosis not present

## 2020-03-25 DIAGNOSIS — Z6827 Body mass index (BMI) 27.0-27.9, adult: Secondary | ICD-10-CM | POA: Diagnosis not present

## 2020-03-25 DIAGNOSIS — D649 Anemia, unspecified: Secondary | ICD-10-CM | POA: Diagnosis not present

## 2020-03-25 DIAGNOSIS — I25810 Atherosclerosis of coronary artery bypass graft(s) without angina pectoris: Secondary | ICD-10-CM | POA: Diagnosis not present

## 2020-03-25 DIAGNOSIS — I10 Essential (primary) hypertension: Secondary | ICD-10-CM | POA: Diagnosis not present

## 2020-03-25 DIAGNOSIS — F419 Anxiety disorder, unspecified: Secondary | ICD-10-CM | POA: Diagnosis not present

## 2020-03-25 NOTE — Telephone Encounter (Signed)
[name] with [facility] in [place] assisted living and pt daughter called to schedule pt f/u appt was a Dr Bronson Ing pt and pt was seen by Dr Candis Musa and requests to have future f/u with [facility] scheduled in March with Dr Harl Bowie

## 2020-03-25 NOTE — Telephone Encounter (Signed)
Jenny Reichmann was requesting an appointment for this pt and she did not have pt DOB - LM

## 2020-03-25 NOTE — Telephone Encounter (Signed)
New message    Jenny Reichmann called requesting to speak with Lanetta Inch again, she said they were speaking earlier and she has a few more questions

## 2020-03-26 DIAGNOSIS — R296 Repeated falls: Secondary | ICD-10-CM | POA: Diagnosis not present

## 2020-03-26 DIAGNOSIS — I5020 Unspecified systolic (congestive) heart failure: Secondary | ICD-10-CM | POA: Diagnosis not present

## 2020-03-26 DIAGNOSIS — R262 Difficulty in walking, not elsewhere classified: Secondary | ICD-10-CM | POA: Diagnosis not present

## 2020-03-26 DIAGNOSIS — M6281 Muscle weakness (generalized): Secondary | ICD-10-CM | POA: Diagnosis not present

## 2020-03-26 DIAGNOSIS — I10 Essential (primary) hypertension: Secondary | ICD-10-CM | POA: Diagnosis not present

## 2020-03-26 DIAGNOSIS — R278 Other lack of coordination: Secondary | ICD-10-CM | POA: Diagnosis not present

## 2020-03-28 DIAGNOSIS — M6281 Muscle weakness (generalized): Secondary | ICD-10-CM | POA: Diagnosis not present

## 2020-03-28 DIAGNOSIS — I10 Essential (primary) hypertension: Secondary | ICD-10-CM | POA: Diagnosis not present

## 2020-03-28 DIAGNOSIS — I5020 Unspecified systolic (congestive) heart failure: Secondary | ICD-10-CM | POA: Diagnosis not present

## 2020-03-28 DIAGNOSIS — R296 Repeated falls: Secondary | ICD-10-CM | POA: Diagnosis not present

## 2020-03-28 DIAGNOSIS — R278 Other lack of coordination: Secondary | ICD-10-CM | POA: Diagnosis not present

## 2020-03-28 DIAGNOSIS — R262 Difficulty in walking, not elsewhere classified: Secondary | ICD-10-CM | POA: Diagnosis not present

## 2020-03-31 DIAGNOSIS — R278 Other lack of coordination: Secondary | ICD-10-CM | POA: Diagnosis not present

## 2020-03-31 DIAGNOSIS — M6281 Muscle weakness (generalized): Secondary | ICD-10-CM | POA: Diagnosis not present

## 2020-03-31 DIAGNOSIS — R296 Repeated falls: Secondary | ICD-10-CM | POA: Diagnosis not present

## 2020-03-31 DIAGNOSIS — I10 Essential (primary) hypertension: Secondary | ICD-10-CM | POA: Diagnosis not present

## 2020-03-31 DIAGNOSIS — R262 Difficulty in walking, not elsewhere classified: Secondary | ICD-10-CM | POA: Diagnosis not present

## 2020-03-31 DIAGNOSIS — I5020 Unspecified systolic (congestive) heart failure: Secondary | ICD-10-CM | POA: Diagnosis not present

## 2020-04-02 DIAGNOSIS — I10 Essential (primary) hypertension: Secondary | ICD-10-CM | POA: Diagnosis not present

## 2020-04-02 DIAGNOSIS — R296 Repeated falls: Secondary | ICD-10-CM | POA: Diagnosis not present

## 2020-04-02 DIAGNOSIS — I5020 Unspecified systolic (congestive) heart failure: Secondary | ICD-10-CM | POA: Diagnosis not present

## 2020-04-02 DIAGNOSIS — R278 Other lack of coordination: Secondary | ICD-10-CM | POA: Diagnosis not present

## 2020-04-02 DIAGNOSIS — R262 Difficulty in walking, not elsewhere classified: Secondary | ICD-10-CM | POA: Diagnosis not present

## 2020-04-02 DIAGNOSIS — M6281 Muscle weakness (generalized): Secondary | ICD-10-CM | POA: Diagnosis not present

## 2020-04-03 DIAGNOSIS — F5101 Primary insomnia: Secondary | ICD-10-CM | POA: Diagnosis not present

## 2020-04-03 DIAGNOSIS — F419 Anxiety disorder, unspecified: Secondary | ICD-10-CM | POA: Diagnosis not present

## 2020-04-03 DIAGNOSIS — F0391 Unspecified dementia with behavioral disturbance: Secondary | ICD-10-CM | POA: Diagnosis not present

## 2020-04-04 DIAGNOSIS — I10 Essential (primary) hypertension: Secondary | ICD-10-CM | POA: Diagnosis not present

## 2020-04-04 DIAGNOSIS — I5020 Unspecified systolic (congestive) heart failure: Secondary | ICD-10-CM | POA: Diagnosis not present

## 2020-04-04 DIAGNOSIS — M6281 Muscle weakness (generalized): Secondary | ICD-10-CM | POA: Diagnosis not present

## 2020-04-04 DIAGNOSIS — R262 Difficulty in walking, not elsewhere classified: Secondary | ICD-10-CM | POA: Diagnosis not present

## 2020-04-04 DIAGNOSIS — R278 Other lack of coordination: Secondary | ICD-10-CM | POA: Diagnosis not present

## 2020-04-04 DIAGNOSIS — R296 Repeated falls: Secondary | ICD-10-CM | POA: Diagnosis not present

## 2020-04-07 DIAGNOSIS — I10 Essential (primary) hypertension: Secondary | ICD-10-CM | POA: Diagnosis not present

## 2020-04-07 DIAGNOSIS — R296 Repeated falls: Secondary | ICD-10-CM | POA: Diagnosis not present

## 2020-04-07 DIAGNOSIS — R262 Difficulty in walking, not elsewhere classified: Secondary | ICD-10-CM | POA: Diagnosis not present

## 2020-04-07 DIAGNOSIS — R278 Other lack of coordination: Secondary | ICD-10-CM | POA: Diagnosis not present

## 2020-04-07 DIAGNOSIS — I5020 Unspecified systolic (congestive) heart failure: Secondary | ICD-10-CM | POA: Diagnosis not present

## 2020-04-07 DIAGNOSIS — M6281 Muscle weakness (generalized): Secondary | ICD-10-CM | POA: Diagnosis not present

## 2020-04-08 DIAGNOSIS — F4320 Adjustment disorder, unspecified: Secondary | ICD-10-CM | POA: Diagnosis not present

## 2020-04-08 DIAGNOSIS — I509 Heart failure, unspecified: Secondary | ICD-10-CM | POA: Diagnosis not present

## 2020-04-08 DIAGNOSIS — F329 Major depressive disorder, single episode, unspecified: Secondary | ICD-10-CM | POA: Diagnosis not present

## 2020-04-08 DIAGNOSIS — I10 Essential (primary) hypertension: Secondary | ICD-10-CM | POA: Diagnosis not present

## 2020-04-08 DIAGNOSIS — F419 Anxiety disorder, unspecified: Secondary | ICD-10-CM | POA: Diagnosis not present

## 2020-04-08 DIAGNOSIS — N189 Chronic kidney disease, unspecified: Secondary | ICD-10-CM | POA: Diagnosis not present

## 2020-04-08 DIAGNOSIS — N400 Enlarged prostate without lower urinary tract symptoms: Secondary | ICD-10-CM | POA: Diagnosis not present

## 2020-04-08 DIAGNOSIS — M1990 Unspecified osteoarthritis, unspecified site: Secondary | ICD-10-CM | POA: Diagnosis not present

## 2020-04-08 DIAGNOSIS — E785 Hyperlipidemia, unspecified: Secondary | ICD-10-CM | POA: Diagnosis not present

## 2020-04-09 DIAGNOSIS — R278 Other lack of coordination: Secondary | ICD-10-CM | POA: Diagnosis not present

## 2020-04-09 DIAGNOSIS — R296 Repeated falls: Secondary | ICD-10-CM | POA: Diagnosis not present

## 2020-04-09 DIAGNOSIS — E7849 Other hyperlipidemia: Secondary | ICD-10-CM | POA: Diagnosis not present

## 2020-04-09 DIAGNOSIS — E038 Other specified hypothyroidism: Secondary | ICD-10-CM | POA: Diagnosis not present

## 2020-04-09 DIAGNOSIS — M6281 Muscle weakness (generalized): Secondary | ICD-10-CM | POA: Diagnosis not present

## 2020-04-09 DIAGNOSIS — I10 Essential (primary) hypertension: Secondary | ICD-10-CM | POA: Diagnosis not present

## 2020-04-09 DIAGNOSIS — I5020 Unspecified systolic (congestive) heart failure: Secondary | ICD-10-CM | POA: Diagnosis not present

## 2020-04-09 DIAGNOSIS — Z79899 Other long term (current) drug therapy: Secondary | ICD-10-CM | POA: Diagnosis not present

## 2020-04-09 DIAGNOSIS — E119 Type 2 diabetes mellitus without complications: Secondary | ICD-10-CM | POA: Diagnosis not present

## 2020-04-09 DIAGNOSIS — E559 Vitamin D deficiency, unspecified: Secondary | ICD-10-CM | POA: Diagnosis not present

## 2020-04-09 DIAGNOSIS — D518 Other vitamin B12 deficiency anemias: Secondary | ICD-10-CM | POA: Diagnosis not present

## 2020-04-09 DIAGNOSIS — R262 Difficulty in walking, not elsewhere classified: Secondary | ICD-10-CM | POA: Diagnosis not present

## 2020-04-11 DIAGNOSIS — I5020 Unspecified systolic (congestive) heart failure: Secondary | ICD-10-CM | POA: Diagnosis not present

## 2020-04-11 DIAGNOSIS — M6281 Muscle weakness (generalized): Secondary | ICD-10-CM | POA: Diagnosis not present

## 2020-04-11 DIAGNOSIS — R296 Repeated falls: Secondary | ICD-10-CM | POA: Diagnosis not present

## 2020-04-11 DIAGNOSIS — I10 Essential (primary) hypertension: Secondary | ICD-10-CM | POA: Diagnosis not present

## 2020-04-11 DIAGNOSIS — R262 Difficulty in walking, not elsewhere classified: Secondary | ICD-10-CM | POA: Diagnosis not present

## 2020-04-11 DIAGNOSIS — R278 Other lack of coordination: Secondary | ICD-10-CM | POA: Diagnosis not present

## 2020-04-16 DIAGNOSIS — I10 Essential (primary) hypertension: Secondary | ICD-10-CM | POA: Diagnosis not present

## 2020-04-16 DIAGNOSIS — M6281 Muscle weakness (generalized): Secondary | ICD-10-CM | POA: Diagnosis not present

## 2020-04-16 DIAGNOSIS — I5020 Unspecified systolic (congestive) heart failure: Secondary | ICD-10-CM | POA: Diagnosis not present

## 2020-04-16 DIAGNOSIS — R262 Difficulty in walking, not elsewhere classified: Secondary | ICD-10-CM | POA: Diagnosis not present

## 2020-04-16 DIAGNOSIS — R278 Other lack of coordination: Secondary | ICD-10-CM | POA: Diagnosis not present

## 2020-04-16 DIAGNOSIS — R296 Repeated falls: Secondary | ICD-10-CM | POA: Diagnosis not present

## 2020-04-18 DIAGNOSIS — M6281 Muscle weakness (generalized): Secondary | ICD-10-CM | POA: Diagnosis not present

## 2020-04-18 DIAGNOSIS — R296 Repeated falls: Secondary | ICD-10-CM | POA: Diagnosis not present

## 2020-04-18 DIAGNOSIS — R278 Other lack of coordination: Secondary | ICD-10-CM | POA: Diagnosis not present

## 2020-04-18 DIAGNOSIS — I5020 Unspecified systolic (congestive) heart failure: Secondary | ICD-10-CM | POA: Diagnosis not present

## 2020-04-18 DIAGNOSIS — R262 Difficulty in walking, not elsewhere classified: Secondary | ICD-10-CM | POA: Diagnosis not present

## 2020-04-18 DIAGNOSIS — I10 Essential (primary) hypertension: Secondary | ICD-10-CM | POA: Diagnosis not present

## 2020-04-23 DIAGNOSIS — R278 Other lack of coordination: Secondary | ICD-10-CM | POA: Diagnosis not present

## 2020-04-23 DIAGNOSIS — M6281 Muscle weakness (generalized): Secondary | ICD-10-CM | POA: Diagnosis not present

## 2020-04-23 DIAGNOSIS — R296 Repeated falls: Secondary | ICD-10-CM | POA: Diagnosis not present

## 2020-04-23 DIAGNOSIS — I10 Essential (primary) hypertension: Secondary | ICD-10-CM | POA: Diagnosis not present

## 2020-04-23 DIAGNOSIS — I5020 Unspecified systolic (congestive) heart failure: Secondary | ICD-10-CM | POA: Diagnosis not present

## 2020-04-23 DIAGNOSIS — R262 Difficulty in walking, not elsewhere classified: Secondary | ICD-10-CM | POA: Diagnosis not present

## 2020-04-24 DIAGNOSIS — F0391 Unspecified dementia with behavioral disturbance: Secondary | ICD-10-CM | POA: Diagnosis not present

## 2020-04-24 DIAGNOSIS — F5101 Primary insomnia: Secondary | ICD-10-CM | POA: Diagnosis not present

## 2020-04-24 DIAGNOSIS — F419 Anxiety disorder, unspecified: Secondary | ICD-10-CM | POA: Diagnosis not present

## 2020-04-28 DIAGNOSIS — R262 Difficulty in walking, not elsewhere classified: Secondary | ICD-10-CM | POA: Diagnosis not present

## 2020-04-28 DIAGNOSIS — R278 Other lack of coordination: Secondary | ICD-10-CM | POA: Diagnosis not present

## 2020-04-28 DIAGNOSIS — R296 Repeated falls: Secondary | ICD-10-CM | POA: Diagnosis not present

## 2020-04-28 DIAGNOSIS — I10 Essential (primary) hypertension: Secondary | ICD-10-CM | POA: Diagnosis not present

## 2020-04-28 DIAGNOSIS — I5020 Unspecified systolic (congestive) heart failure: Secondary | ICD-10-CM | POA: Diagnosis not present

## 2020-04-28 DIAGNOSIS — M6281 Muscle weakness (generalized): Secondary | ICD-10-CM | POA: Diagnosis not present

## 2020-04-30 DIAGNOSIS — I10 Essential (primary) hypertension: Secondary | ICD-10-CM | POA: Diagnosis not present

## 2020-04-30 DIAGNOSIS — R278 Other lack of coordination: Secondary | ICD-10-CM | POA: Diagnosis not present

## 2020-04-30 DIAGNOSIS — I5020 Unspecified systolic (congestive) heart failure: Secondary | ICD-10-CM | POA: Diagnosis not present

## 2020-04-30 DIAGNOSIS — M6281 Muscle weakness (generalized): Secondary | ICD-10-CM | POA: Diagnosis not present

## 2020-04-30 DIAGNOSIS — R262 Difficulty in walking, not elsewhere classified: Secondary | ICD-10-CM | POA: Diagnosis not present

## 2020-04-30 DIAGNOSIS — R296 Repeated falls: Secondary | ICD-10-CM | POA: Diagnosis not present

## 2020-05-02 DIAGNOSIS — Z23 Encounter for immunization: Secondary | ICD-10-CM | POA: Diagnosis not present

## 2020-05-02 DIAGNOSIS — R262 Difficulty in walking, not elsewhere classified: Secondary | ICD-10-CM | POA: Diagnosis not present

## 2020-05-02 DIAGNOSIS — R278 Other lack of coordination: Secondary | ICD-10-CM | POA: Diagnosis not present

## 2020-05-02 DIAGNOSIS — M6281 Muscle weakness (generalized): Secondary | ICD-10-CM | POA: Diagnosis not present

## 2020-05-02 DIAGNOSIS — R296 Repeated falls: Secondary | ICD-10-CM | POA: Diagnosis not present

## 2020-05-02 DIAGNOSIS — I10 Essential (primary) hypertension: Secondary | ICD-10-CM | POA: Diagnosis not present

## 2020-05-02 DIAGNOSIS — I5020 Unspecified systolic (congestive) heart failure: Secondary | ICD-10-CM | POA: Diagnosis not present

## 2020-05-05 DIAGNOSIS — I5020 Unspecified systolic (congestive) heart failure: Secondary | ICD-10-CM | POA: Diagnosis not present

## 2020-05-05 DIAGNOSIS — M6281 Muscle weakness (generalized): Secondary | ICD-10-CM | POA: Diagnosis not present

## 2020-05-05 DIAGNOSIS — R296 Repeated falls: Secondary | ICD-10-CM | POA: Diagnosis not present

## 2020-05-05 DIAGNOSIS — R278 Other lack of coordination: Secondary | ICD-10-CM | POA: Diagnosis not present

## 2020-05-05 DIAGNOSIS — I10 Essential (primary) hypertension: Secondary | ICD-10-CM | POA: Diagnosis not present

## 2020-05-05 DIAGNOSIS — R262 Difficulty in walking, not elsewhere classified: Secondary | ICD-10-CM | POA: Diagnosis not present

## 2020-05-06 DIAGNOSIS — I10 Essential (primary) hypertension: Secondary | ICD-10-CM | POA: Diagnosis not present

## 2020-05-06 DIAGNOSIS — N400 Enlarged prostate without lower urinary tract symptoms: Secondary | ICD-10-CM | POA: Diagnosis not present

## 2020-05-06 DIAGNOSIS — F0391 Unspecified dementia with behavioral disturbance: Secondary | ICD-10-CM | POA: Diagnosis not present

## 2020-05-06 DIAGNOSIS — N189 Chronic kidney disease, unspecified: Secondary | ICD-10-CM | POA: Diagnosis not present

## 2020-05-06 DIAGNOSIS — M1990 Unspecified osteoarthritis, unspecified site: Secondary | ICD-10-CM | POA: Diagnosis not present

## 2020-05-06 DIAGNOSIS — E785 Hyperlipidemia, unspecified: Secondary | ICD-10-CM | POA: Diagnosis not present

## 2020-05-06 DIAGNOSIS — I509 Heart failure, unspecified: Secondary | ICD-10-CM | POA: Diagnosis not present

## 2020-05-06 DIAGNOSIS — F329 Major depressive disorder, single episode, unspecified: Secondary | ICD-10-CM | POA: Diagnosis not present

## 2020-05-07 DIAGNOSIS — I10 Essential (primary) hypertension: Secondary | ICD-10-CM | POA: Diagnosis not present

## 2020-05-07 DIAGNOSIS — R262 Difficulty in walking, not elsewhere classified: Secondary | ICD-10-CM | POA: Diagnosis not present

## 2020-05-07 DIAGNOSIS — I5020 Unspecified systolic (congestive) heart failure: Secondary | ICD-10-CM | POA: Diagnosis not present

## 2020-05-07 DIAGNOSIS — N390 Urinary tract infection, site not specified: Secondary | ICD-10-CM | POA: Diagnosis not present

## 2020-05-07 DIAGNOSIS — R296 Repeated falls: Secondary | ICD-10-CM | POA: Diagnosis not present

## 2020-05-07 DIAGNOSIS — R278 Other lack of coordination: Secondary | ICD-10-CM | POA: Diagnosis not present

## 2020-05-07 DIAGNOSIS — M6281 Muscle weakness (generalized): Secondary | ICD-10-CM | POA: Diagnosis not present

## 2020-05-10 DIAGNOSIS — I5020 Unspecified systolic (congestive) heart failure: Secondary | ICD-10-CM | POA: Diagnosis not present

## 2020-05-10 DIAGNOSIS — R262 Difficulty in walking, not elsewhere classified: Secondary | ICD-10-CM | POA: Diagnosis not present

## 2020-05-10 DIAGNOSIS — R296 Repeated falls: Secondary | ICD-10-CM | POA: Diagnosis not present

## 2020-05-10 DIAGNOSIS — I10 Essential (primary) hypertension: Secondary | ICD-10-CM | POA: Diagnosis not present

## 2020-05-10 DIAGNOSIS — R278 Other lack of coordination: Secondary | ICD-10-CM | POA: Diagnosis not present

## 2020-05-10 DIAGNOSIS — M6281 Muscle weakness (generalized): Secondary | ICD-10-CM | POA: Diagnosis not present

## 2020-05-11 DIAGNOSIS — N400 Enlarged prostate without lower urinary tract symptoms: Secondary | ICD-10-CM | POA: Diagnosis not present

## 2020-05-11 DIAGNOSIS — D518 Other vitamin B12 deficiency anemias: Secondary | ICD-10-CM | POA: Diagnosis not present

## 2020-05-11 DIAGNOSIS — E119 Type 2 diabetes mellitus without complications: Secondary | ICD-10-CM | POA: Diagnosis not present

## 2020-05-11 DIAGNOSIS — M1990 Unspecified osteoarthritis, unspecified site: Secondary | ICD-10-CM | POA: Diagnosis not present

## 2020-05-11 DIAGNOSIS — E559 Vitamin D deficiency, unspecified: Secondary | ICD-10-CM | POA: Diagnosis not present

## 2020-05-11 DIAGNOSIS — I509 Heart failure, unspecified: Secondary | ICD-10-CM | POA: Diagnosis not present

## 2020-05-11 DIAGNOSIS — F329 Major depressive disorder, single episode, unspecified: Secondary | ICD-10-CM | POA: Diagnosis not present

## 2020-05-11 DIAGNOSIS — I10 Essential (primary) hypertension: Secondary | ICD-10-CM | POA: Diagnosis not present

## 2020-05-11 DIAGNOSIS — E785 Hyperlipidemia, unspecified: Secondary | ICD-10-CM | POA: Diagnosis not present

## 2020-05-11 DIAGNOSIS — I2510 Atherosclerotic heart disease of native coronary artery without angina pectoris: Secondary | ICD-10-CM | POA: Diagnosis not present

## 2020-05-11 DIAGNOSIS — E038 Other specified hypothyroidism: Secondary | ICD-10-CM | POA: Diagnosis not present

## 2020-05-11 DIAGNOSIS — F0391 Unspecified dementia with behavioral disturbance: Secondary | ICD-10-CM | POA: Diagnosis not present

## 2020-05-14 DIAGNOSIS — I5020 Unspecified systolic (congestive) heart failure: Secondary | ICD-10-CM | POA: Diagnosis not present

## 2020-05-14 DIAGNOSIS — F4320 Adjustment disorder, unspecified: Secondary | ICD-10-CM | POA: Diagnosis not present

## 2020-05-14 DIAGNOSIS — R278 Other lack of coordination: Secondary | ICD-10-CM | POA: Diagnosis not present

## 2020-05-14 DIAGNOSIS — M6281 Muscle weakness (generalized): Secondary | ICD-10-CM | POA: Diagnosis not present

## 2020-05-14 DIAGNOSIS — I10 Essential (primary) hypertension: Secondary | ICD-10-CM | POA: Diagnosis not present

## 2020-05-14 DIAGNOSIS — R296 Repeated falls: Secondary | ICD-10-CM | POA: Diagnosis not present

## 2020-05-14 DIAGNOSIS — R262 Difficulty in walking, not elsewhere classified: Secondary | ICD-10-CM | POA: Diagnosis not present

## 2020-05-14 DIAGNOSIS — F419 Anxiety disorder, unspecified: Secondary | ICD-10-CM | POA: Diagnosis not present

## 2020-05-17 DIAGNOSIS — R278 Other lack of coordination: Secondary | ICD-10-CM | POA: Diagnosis not present

## 2020-05-17 DIAGNOSIS — R296 Repeated falls: Secondary | ICD-10-CM | POA: Diagnosis not present

## 2020-05-17 DIAGNOSIS — I5020 Unspecified systolic (congestive) heart failure: Secondary | ICD-10-CM | POA: Diagnosis not present

## 2020-05-17 DIAGNOSIS — I10 Essential (primary) hypertension: Secondary | ICD-10-CM | POA: Diagnosis not present

## 2020-05-17 DIAGNOSIS — R262 Difficulty in walking, not elsewhere classified: Secondary | ICD-10-CM | POA: Diagnosis not present

## 2020-05-17 DIAGNOSIS — M6281 Muscle weakness (generalized): Secondary | ICD-10-CM | POA: Diagnosis not present

## 2020-05-19 DIAGNOSIS — M6281 Muscle weakness (generalized): Secondary | ICD-10-CM | POA: Diagnosis not present

## 2020-05-19 DIAGNOSIS — I5020 Unspecified systolic (congestive) heart failure: Secondary | ICD-10-CM | POA: Diagnosis not present

## 2020-05-19 DIAGNOSIS — R296 Repeated falls: Secondary | ICD-10-CM | POA: Diagnosis not present

## 2020-05-19 DIAGNOSIS — R262 Difficulty in walking, not elsewhere classified: Secondary | ICD-10-CM | POA: Diagnosis not present

## 2020-05-19 DIAGNOSIS — R278 Other lack of coordination: Secondary | ICD-10-CM | POA: Diagnosis not present

## 2020-05-19 DIAGNOSIS — I10 Essential (primary) hypertension: Secondary | ICD-10-CM | POA: Diagnosis not present

## 2020-05-21 DIAGNOSIS — M6281 Muscle weakness (generalized): Secondary | ICD-10-CM | POA: Diagnosis not present

## 2020-05-21 DIAGNOSIS — R278 Other lack of coordination: Secondary | ICD-10-CM | POA: Diagnosis not present

## 2020-05-21 DIAGNOSIS — R262 Difficulty in walking, not elsewhere classified: Secondary | ICD-10-CM | POA: Diagnosis not present

## 2020-05-21 DIAGNOSIS — I10 Essential (primary) hypertension: Secondary | ICD-10-CM | POA: Diagnosis not present

## 2020-05-21 DIAGNOSIS — I5020 Unspecified systolic (congestive) heart failure: Secondary | ICD-10-CM | POA: Diagnosis not present

## 2020-05-21 DIAGNOSIS — R296 Repeated falls: Secondary | ICD-10-CM | POA: Diagnosis not present

## 2020-05-23 DIAGNOSIS — I5020 Unspecified systolic (congestive) heart failure: Secondary | ICD-10-CM | POA: Diagnosis not present

## 2020-05-23 DIAGNOSIS — M6281 Muscle weakness (generalized): Secondary | ICD-10-CM | POA: Diagnosis not present

## 2020-05-23 DIAGNOSIS — R296 Repeated falls: Secondary | ICD-10-CM | POA: Diagnosis not present

## 2020-05-23 DIAGNOSIS — R278 Other lack of coordination: Secondary | ICD-10-CM | POA: Diagnosis not present

## 2020-05-23 DIAGNOSIS — R262 Difficulty in walking, not elsewhere classified: Secondary | ICD-10-CM | POA: Diagnosis not present

## 2020-05-23 DIAGNOSIS — I10 Essential (primary) hypertension: Secondary | ICD-10-CM | POA: Diagnosis not present

## 2020-05-26 DIAGNOSIS — R296 Repeated falls: Secondary | ICD-10-CM | POA: Diagnosis not present

## 2020-05-26 DIAGNOSIS — F0391 Unspecified dementia with behavioral disturbance: Secondary | ICD-10-CM | POA: Diagnosis not present

## 2020-05-26 DIAGNOSIS — F5101 Primary insomnia: Secondary | ICD-10-CM | POA: Diagnosis not present

## 2020-05-26 DIAGNOSIS — I5020 Unspecified systolic (congestive) heart failure: Secondary | ICD-10-CM | POA: Diagnosis not present

## 2020-05-26 DIAGNOSIS — I10 Essential (primary) hypertension: Secondary | ICD-10-CM | POA: Diagnosis not present

## 2020-05-26 DIAGNOSIS — R262 Difficulty in walking, not elsewhere classified: Secondary | ICD-10-CM | POA: Diagnosis not present

## 2020-05-26 DIAGNOSIS — M6281 Muscle weakness (generalized): Secondary | ICD-10-CM | POA: Diagnosis not present

## 2020-05-26 DIAGNOSIS — R278 Other lack of coordination: Secondary | ICD-10-CM | POA: Diagnosis not present

## 2020-05-26 DIAGNOSIS — F419 Anxiety disorder, unspecified: Secondary | ICD-10-CM | POA: Diagnosis not present

## 2020-05-28 DIAGNOSIS — M6281 Muscle weakness (generalized): Secondary | ICD-10-CM | POA: Diagnosis not present

## 2020-05-28 DIAGNOSIS — R262 Difficulty in walking, not elsewhere classified: Secondary | ICD-10-CM | POA: Diagnosis not present

## 2020-05-28 DIAGNOSIS — I5020 Unspecified systolic (congestive) heart failure: Secondary | ICD-10-CM | POA: Diagnosis not present

## 2020-05-28 DIAGNOSIS — I10 Essential (primary) hypertension: Secondary | ICD-10-CM | POA: Diagnosis not present

## 2020-05-28 DIAGNOSIS — R278 Other lack of coordination: Secondary | ICD-10-CM | POA: Diagnosis not present

## 2020-05-28 DIAGNOSIS — R296 Repeated falls: Secondary | ICD-10-CM | POA: Diagnosis not present

## 2020-05-30 DIAGNOSIS — M6281 Muscle weakness (generalized): Secondary | ICD-10-CM | POA: Diagnosis not present

## 2020-05-30 DIAGNOSIS — R262 Difficulty in walking, not elsewhere classified: Secondary | ICD-10-CM | POA: Diagnosis not present

## 2020-05-30 DIAGNOSIS — R278 Other lack of coordination: Secondary | ICD-10-CM | POA: Diagnosis not present

## 2020-05-30 DIAGNOSIS — R296 Repeated falls: Secondary | ICD-10-CM | POA: Diagnosis not present

## 2020-05-30 DIAGNOSIS — I5020 Unspecified systolic (congestive) heart failure: Secondary | ICD-10-CM | POA: Diagnosis not present

## 2020-05-30 DIAGNOSIS — I10 Essential (primary) hypertension: Secondary | ICD-10-CM | POA: Diagnosis not present

## 2020-06-02 DIAGNOSIS — R262 Difficulty in walking, not elsewhere classified: Secondary | ICD-10-CM | POA: Diagnosis not present

## 2020-06-02 DIAGNOSIS — R278 Other lack of coordination: Secondary | ICD-10-CM | POA: Diagnosis not present

## 2020-06-02 DIAGNOSIS — I10 Essential (primary) hypertension: Secondary | ICD-10-CM | POA: Diagnosis not present

## 2020-06-02 DIAGNOSIS — R296 Repeated falls: Secondary | ICD-10-CM | POA: Diagnosis not present

## 2020-06-02 DIAGNOSIS — I5020 Unspecified systolic (congestive) heart failure: Secondary | ICD-10-CM | POA: Diagnosis not present

## 2020-06-02 DIAGNOSIS — M6281 Muscle weakness (generalized): Secondary | ICD-10-CM | POA: Diagnosis not present

## 2020-06-03 DIAGNOSIS — N189 Chronic kidney disease, unspecified: Secondary | ICD-10-CM | POA: Diagnosis not present

## 2020-06-03 DIAGNOSIS — F329 Major depressive disorder, single episode, unspecified: Secondary | ICD-10-CM | POA: Diagnosis not present

## 2020-06-03 DIAGNOSIS — F0391 Unspecified dementia with behavioral disturbance: Secondary | ICD-10-CM | POA: Diagnosis not present

## 2020-06-03 DIAGNOSIS — E785 Hyperlipidemia, unspecified: Secondary | ICD-10-CM | POA: Diagnosis not present

## 2020-06-03 DIAGNOSIS — I509 Heart failure, unspecified: Secondary | ICD-10-CM | POA: Diagnosis not present

## 2020-06-03 DIAGNOSIS — I10 Essential (primary) hypertension: Secondary | ICD-10-CM | POA: Diagnosis not present

## 2020-06-03 DIAGNOSIS — N400 Enlarged prostate without lower urinary tract symptoms: Secondary | ICD-10-CM | POA: Diagnosis not present

## 2020-06-03 DIAGNOSIS — M1990 Unspecified osteoarthritis, unspecified site: Secondary | ICD-10-CM | POA: Diagnosis not present

## 2020-06-04 DIAGNOSIS — I10 Essential (primary) hypertension: Secondary | ICD-10-CM | POA: Diagnosis not present

## 2020-06-04 DIAGNOSIS — I5020 Unspecified systolic (congestive) heart failure: Secondary | ICD-10-CM | POA: Diagnosis not present

## 2020-06-04 DIAGNOSIS — M6281 Muscle weakness (generalized): Secondary | ICD-10-CM | POA: Diagnosis not present

## 2020-06-04 DIAGNOSIS — U071 COVID-19: Secondary | ICD-10-CM | POA: Diagnosis not present

## 2020-06-04 DIAGNOSIS — R296 Repeated falls: Secondary | ICD-10-CM | POA: Diagnosis not present

## 2020-06-04 DIAGNOSIS — Z20828 Contact with and (suspected) exposure to other viral communicable diseases: Secondary | ICD-10-CM | POA: Diagnosis not present

## 2020-06-04 DIAGNOSIS — R278 Other lack of coordination: Secondary | ICD-10-CM | POA: Diagnosis not present

## 2020-06-04 DIAGNOSIS — F4320 Adjustment disorder, unspecified: Secondary | ICD-10-CM | POA: Diagnosis not present

## 2020-06-04 DIAGNOSIS — R262 Difficulty in walking, not elsewhere classified: Secondary | ICD-10-CM | POA: Diagnosis not present

## 2020-06-04 DIAGNOSIS — F419 Anxiety disorder, unspecified: Secondary | ICD-10-CM | POA: Diagnosis not present

## 2020-06-06 DIAGNOSIS — M6281 Muscle weakness (generalized): Secondary | ICD-10-CM | POA: Diagnosis not present

## 2020-06-06 DIAGNOSIS — R296 Repeated falls: Secondary | ICD-10-CM | POA: Diagnosis not present

## 2020-06-06 DIAGNOSIS — I10 Essential (primary) hypertension: Secondary | ICD-10-CM | POA: Diagnosis not present

## 2020-06-06 DIAGNOSIS — R262 Difficulty in walking, not elsewhere classified: Secondary | ICD-10-CM | POA: Diagnosis not present

## 2020-06-06 DIAGNOSIS — R278 Other lack of coordination: Secondary | ICD-10-CM | POA: Diagnosis not present

## 2020-06-06 DIAGNOSIS — I5020 Unspecified systolic (congestive) heart failure: Secondary | ICD-10-CM | POA: Diagnosis not present

## 2020-06-09 DIAGNOSIS — R278 Other lack of coordination: Secondary | ICD-10-CM | POA: Diagnosis not present

## 2020-06-09 DIAGNOSIS — R296 Repeated falls: Secondary | ICD-10-CM | POA: Diagnosis not present

## 2020-06-09 DIAGNOSIS — I5020 Unspecified systolic (congestive) heart failure: Secondary | ICD-10-CM | POA: Diagnosis not present

## 2020-06-09 DIAGNOSIS — M6281 Muscle weakness (generalized): Secondary | ICD-10-CM | POA: Diagnosis not present

## 2020-06-09 DIAGNOSIS — R262 Difficulty in walking, not elsewhere classified: Secondary | ICD-10-CM | POA: Diagnosis not present

## 2020-06-09 DIAGNOSIS — I10 Essential (primary) hypertension: Secondary | ICD-10-CM | POA: Diagnosis not present

## 2020-06-10 DIAGNOSIS — U071 COVID-19: Secondary | ICD-10-CM | POA: Diagnosis not present

## 2020-06-10 DIAGNOSIS — Z20828 Contact with and (suspected) exposure to other viral communicable diseases: Secondary | ICD-10-CM | POA: Diagnosis not present

## 2020-06-11 DIAGNOSIS — R262 Difficulty in walking, not elsewhere classified: Secondary | ICD-10-CM | POA: Diagnosis not present

## 2020-06-11 DIAGNOSIS — R296 Repeated falls: Secondary | ICD-10-CM | POA: Diagnosis not present

## 2020-06-11 DIAGNOSIS — I10 Essential (primary) hypertension: Secondary | ICD-10-CM | POA: Diagnosis not present

## 2020-06-11 DIAGNOSIS — M6281 Muscle weakness (generalized): Secondary | ICD-10-CM | POA: Diagnosis not present

## 2020-06-11 DIAGNOSIS — R278 Other lack of coordination: Secondary | ICD-10-CM | POA: Diagnosis not present

## 2020-06-11 DIAGNOSIS — I5020 Unspecified systolic (congestive) heart failure: Secondary | ICD-10-CM | POA: Diagnosis not present

## 2020-06-12 DIAGNOSIS — M1990 Unspecified osteoarthritis, unspecified site: Secondary | ICD-10-CM | POA: Diagnosis not present

## 2020-06-12 DIAGNOSIS — F329 Major depressive disorder, single episode, unspecified: Secondary | ICD-10-CM | POA: Diagnosis not present

## 2020-06-12 DIAGNOSIS — E559 Vitamin D deficiency, unspecified: Secondary | ICD-10-CM | POA: Diagnosis not present

## 2020-06-12 DIAGNOSIS — D518 Other vitamin B12 deficiency anemias: Secondary | ICD-10-CM | POA: Diagnosis not present

## 2020-06-12 DIAGNOSIS — E785 Hyperlipidemia, unspecified: Secondary | ICD-10-CM | POA: Diagnosis not present

## 2020-06-12 DIAGNOSIS — N400 Enlarged prostate without lower urinary tract symptoms: Secondary | ICD-10-CM | POA: Diagnosis not present

## 2020-06-12 DIAGNOSIS — E038 Other specified hypothyroidism: Secondary | ICD-10-CM | POA: Diagnosis not present

## 2020-06-12 DIAGNOSIS — I509 Heart failure, unspecified: Secondary | ICD-10-CM | POA: Diagnosis not present

## 2020-06-12 DIAGNOSIS — F0391 Unspecified dementia with behavioral disturbance: Secondary | ICD-10-CM | POA: Diagnosis not present

## 2020-06-12 DIAGNOSIS — E119 Type 2 diabetes mellitus without complications: Secondary | ICD-10-CM | POA: Diagnosis not present

## 2020-06-12 DIAGNOSIS — I2510 Atherosclerotic heart disease of native coronary artery without angina pectoris: Secondary | ICD-10-CM | POA: Diagnosis not present

## 2020-06-12 DIAGNOSIS — I10 Essential (primary) hypertension: Secondary | ICD-10-CM | POA: Diagnosis not present

## 2020-06-13 DIAGNOSIS — R278 Other lack of coordination: Secondary | ICD-10-CM | POA: Diagnosis not present

## 2020-06-13 DIAGNOSIS — I5020 Unspecified systolic (congestive) heart failure: Secondary | ICD-10-CM | POA: Diagnosis not present

## 2020-06-13 DIAGNOSIS — R262 Difficulty in walking, not elsewhere classified: Secondary | ICD-10-CM | POA: Diagnosis not present

## 2020-06-13 DIAGNOSIS — M6281 Muscle weakness (generalized): Secondary | ICD-10-CM | POA: Diagnosis not present

## 2020-06-13 DIAGNOSIS — R296 Repeated falls: Secondary | ICD-10-CM | POA: Diagnosis not present

## 2020-06-13 DIAGNOSIS — I10 Essential (primary) hypertension: Secondary | ICD-10-CM | POA: Diagnosis not present

## 2020-06-16 DIAGNOSIS — I5020 Unspecified systolic (congestive) heart failure: Secondary | ICD-10-CM | POA: Diagnosis not present

## 2020-06-16 DIAGNOSIS — R296 Repeated falls: Secondary | ICD-10-CM | POA: Diagnosis not present

## 2020-06-16 DIAGNOSIS — M6281 Muscle weakness (generalized): Secondary | ICD-10-CM | POA: Diagnosis not present

## 2020-06-16 DIAGNOSIS — I10 Essential (primary) hypertension: Secondary | ICD-10-CM | POA: Diagnosis not present

## 2020-06-16 DIAGNOSIS — R278 Other lack of coordination: Secondary | ICD-10-CM | POA: Diagnosis not present

## 2020-06-16 DIAGNOSIS — R262 Difficulty in walking, not elsewhere classified: Secondary | ICD-10-CM | POA: Diagnosis not present

## 2020-06-18 DIAGNOSIS — R262 Difficulty in walking, not elsewhere classified: Secondary | ICD-10-CM | POA: Diagnosis not present

## 2020-06-18 DIAGNOSIS — I10 Essential (primary) hypertension: Secondary | ICD-10-CM | POA: Diagnosis not present

## 2020-06-18 DIAGNOSIS — M6281 Muscle weakness (generalized): Secondary | ICD-10-CM | POA: Diagnosis not present

## 2020-06-18 DIAGNOSIS — R278 Other lack of coordination: Secondary | ICD-10-CM | POA: Diagnosis not present

## 2020-06-18 DIAGNOSIS — I5020 Unspecified systolic (congestive) heart failure: Secondary | ICD-10-CM | POA: Diagnosis not present

## 2020-06-18 DIAGNOSIS — R296 Repeated falls: Secondary | ICD-10-CM | POA: Diagnosis not present

## 2020-06-19 DIAGNOSIS — F5101 Primary insomnia: Secondary | ICD-10-CM | POA: Diagnosis not present

## 2020-06-19 DIAGNOSIS — F419 Anxiety disorder, unspecified: Secondary | ICD-10-CM | POA: Diagnosis not present

## 2020-06-19 DIAGNOSIS — F0391 Unspecified dementia with behavioral disturbance: Secondary | ICD-10-CM | POA: Diagnosis not present

## 2020-06-20 DIAGNOSIS — R262 Difficulty in walking, not elsewhere classified: Secondary | ICD-10-CM | POA: Diagnosis not present

## 2020-06-20 DIAGNOSIS — I5020 Unspecified systolic (congestive) heart failure: Secondary | ICD-10-CM | POA: Diagnosis not present

## 2020-06-20 DIAGNOSIS — R278 Other lack of coordination: Secondary | ICD-10-CM | POA: Diagnosis not present

## 2020-06-20 DIAGNOSIS — M6281 Muscle weakness (generalized): Secondary | ICD-10-CM | POA: Diagnosis not present

## 2020-06-20 DIAGNOSIS — R296 Repeated falls: Secondary | ICD-10-CM | POA: Diagnosis not present

## 2020-06-20 DIAGNOSIS — I10 Essential (primary) hypertension: Secondary | ICD-10-CM | POA: Diagnosis not present

## 2020-06-23 DIAGNOSIS — R278 Other lack of coordination: Secondary | ICD-10-CM | POA: Diagnosis not present

## 2020-06-23 DIAGNOSIS — I10 Essential (primary) hypertension: Secondary | ICD-10-CM | POA: Diagnosis not present

## 2020-06-23 DIAGNOSIS — I5020 Unspecified systolic (congestive) heart failure: Secondary | ICD-10-CM | POA: Diagnosis not present

## 2020-06-23 DIAGNOSIS — R296 Repeated falls: Secondary | ICD-10-CM | POA: Diagnosis not present

## 2020-06-23 DIAGNOSIS — M6281 Muscle weakness (generalized): Secondary | ICD-10-CM | POA: Diagnosis not present

## 2020-06-23 DIAGNOSIS — R262 Difficulty in walking, not elsewhere classified: Secondary | ICD-10-CM | POA: Diagnosis not present

## 2020-06-25 DIAGNOSIS — R262 Difficulty in walking, not elsewhere classified: Secondary | ICD-10-CM | POA: Diagnosis not present

## 2020-06-25 DIAGNOSIS — M6281 Muscle weakness (generalized): Secondary | ICD-10-CM | POA: Diagnosis not present

## 2020-06-25 DIAGNOSIS — F419 Anxiety disorder, unspecified: Secondary | ICD-10-CM | POA: Diagnosis not present

## 2020-06-25 DIAGNOSIS — I5020 Unspecified systolic (congestive) heart failure: Secondary | ICD-10-CM | POA: Diagnosis not present

## 2020-06-25 DIAGNOSIS — F4320 Adjustment disorder, unspecified: Secondary | ICD-10-CM | POA: Diagnosis not present

## 2020-06-25 DIAGNOSIS — I10 Essential (primary) hypertension: Secondary | ICD-10-CM | POA: Diagnosis not present

## 2020-06-25 DIAGNOSIS — R296 Repeated falls: Secondary | ICD-10-CM | POA: Diagnosis not present

## 2020-06-25 DIAGNOSIS — R278 Other lack of coordination: Secondary | ICD-10-CM | POA: Diagnosis not present

## 2020-06-30 ENCOUNTER — Ambulatory Visit (INDEPENDENT_AMBULATORY_CARE_PROVIDER_SITE_OTHER): Payer: Medicare Other | Admitting: Cardiology

## 2020-06-30 ENCOUNTER — Encounter: Payer: Self-pay | Admitting: *Deleted

## 2020-06-30 ENCOUNTER — Other Ambulatory Visit: Payer: Self-pay

## 2020-06-30 VITALS — BP 120/70 | HR 74 | Ht 71.0 in | Wt 194.2 lb

## 2020-06-30 DIAGNOSIS — I5023 Acute on chronic systolic (congestive) heart failure: Secondary | ICD-10-CM

## 2020-06-30 DIAGNOSIS — I2510 Atherosclerotic heart disease of native coronary artery without angina pectoris: Secondary | ICD-10-CM | POA: Diagnosis not present

## 2020-06-30 MED ORDER — FUROSEMIDE 80 MG PO TABS
80.0000 mg | ORAL_TABLET | Freq: Two times a day (BID) | ORAL | 3 refills | Status: DC
Start: 2020-06-30 — End: 2020-10-13

## 2020-06-30 NOTE — Patient Instructions (Signed)
Your physician recommends that you schedule a follow-up appointment in: 3 MONTHS WITH EXTENDER  Your physician has recommended you make the following change in your medication:   INCREASE LASIX 32 MG TWICE DAILY   Your physician recommends that you return for lab work BMP/MG/BNP  Thank you for choosing [facility]!!

## 2020-06-30 NOTE — Progress Notes (Signed)
Clinical Summary [name] is a 87 y.o.male former patient of Dr Bronson Ing, this is our first visit together. Seen for the following medical problems.   1. CAD - CABG in 26-Jul-1992  - He underwent cardiac catheterization on 02/15/2019 which showedsevere 3-vessel CAD with patent grafts to OM, LAD and diagonal butwasnoted to have 80% stenosis along SVG-PDA which was treated with PCI/DES placement - plavix stopped due to nosebleed and anemia by Dr Candis Musa, remains just on ASA  - sharp pain, midchest. Not positional. Can be better with movement -     2. Chronic combined systolic/diastolic HF - 08/4268 echo LVEF 35-40%, grade I dd - worsening SOB - mild edema. Some orthopnea    3. Bradycardia - no recent symptoms  4. Carotid stenosis  5. HTN   6. Hyperlipidemia -labs followed at nursing home  7. CKD 3   8. History of TIA  9. Dementia   Social history: His wife of 21 years passed away in 2014/07/27. His daughter, [name] with him now. He has a son as well. His daughter used to work at [employer] in [employer].  Past Medical History:  Diagnosis Date  . Aortic insufficiency    a. previously mild in July 27, 2010. b. trivial seen on echo in 05/2018.  . Asbestos exposure   . Bradycardia    MILD  . CAD (coronary artery disease) 07/26/2001   a. s/p CABG in 1992/07/26. b. Cath 07-26-2001 patent grafts. c. DOE in 07/27/18 felt anginal equivalent - cath 01/2019 s/p PCI/DES to the SVG-PDA.  . Cancer ([place])    Skin  . Carotid artery disease ([place]) 07-27-1999   BILATERAL 0-39%/  Follow with Dopplers by Dr. Woody Seller  . Chronic combined systolic and diastolic CHF (congestive heart failure) ([place])   . CKD (chronic kidney disease), stage III   . Colitis   . Diabetes mellitus   . Dilated aortic root ([place]) 05/2018  . Dizziness    He has had falls related to this / Chronic dizziness  . Dyslipidemia   . Hx of CABG   . Hypertension   . Nephrolithiasis   . PVD (peripheral vascular disease) ([place])   . Renal  cyst   . TIA (transient ischemic attack) 07-26-2005   ?? medical Rx      Allergies  Allergen Reactions  . Sulfonamide Derivatives Rash  . Tetracycline Rash     Current Outpatient Medications  Medication Sig Dispense Refill  . aspirin EC 81 MG tablet Take 1 tablet (81 mg total) by mouth daily.    Marland Kitchen atorvastatin (LIPITOR) 20 MG tablet Take 20 mg by mouth every morning.   4  . cetirizine (ZYRTEC) 10 MG tablet Take 10 mg by mouth daily.   5  . citalopram (CELEXA) 40 MG tablet Take 0.5 tablets (20 mg total) by mouth daily. Please cut your existing tablets with a pill cutter. If you have any difficulty, let your primary care doctor know.    . clopidogrel (PLAVIX) 75 MG tablet Take 1 tablet (75 mg total) by mouth daily. (Patient taking differently: Take 75 mg by mouth every morning. ) 90 tablet 3  . furosemide (LASIX) 20 MG tablet Take 0.5 tablets (10 mg total) by mouth daily as needed. 30 tablet 11  . metFORMIN (GLUCOPHAGE) 500 MG tablet Take 500 mg by mouth at bedtime.     . metoprolol succinate (TOPROL-XL) 25 MG 24 hr tablet Take 0.5 tablets (12.5 mg total) by mouth every morning. [place]  tablet 1  . Multiple Vitamin (MULTIVITAMIN) tablet Take 1 tablet by mouth daily.      . nitroGLYCERIN (NITROSTAT) 0.4 MG SL tablet Place 1 tablet (0.4 mg total) under the tongue every 5 (five) minutes as needed for chest pain. 25 tablet 3  . potassium chloride SA (KLOR-CON) 20 MEQ tablet Take 1 tablet (20 mEq total) by mouth daily. (Patient taking differently: Take 20 mEq by mouth daily as needed (for potassium). ) 30 tablet 6  . risperiDONE (RISPERDAL) 0.25 MG tablet Take 1 tablet (0.25 mg total) by mouth at bedtime.    Marland Kitchen rOPINIRole (REQUIP) 1 MG tablet Take 1-2 mg by mouth See admin instructions. Take 1 tab daily as needed and takes 2 tablets in the evening     No current facility-administered medications for this visit.     Past Surgical History:  Procedure Laterality Date  . CORONARY ARTERY BYPASS GRAFT     . CORONARY STENT INTERVENTION N/A 02/15/2019   Procedure: CORONARY STENT INTERVENTION;  Surgeon: Jettie Booze, MD;  Location: [facility];  Service: Cardiovascular;  Laterality: N/A;  SEQ SVG PDA-PL  . LEFT HEART CATH AND CORS/GRAFTS ANGIOGRAPHY N/A 02/15/2019   Procedure: LEFT HEART CATH AND CORS/GRAFTS ANGIOGRAPHY;  Surgeon: Jettie Booze, MD;  Location: [facility];  Service: Cardiovascular;  Laterality: N/A;  . percutaneous transluminal coronary angioplasty     hx     Allergies  Allergen Reactions  . Sulfonamide Derivatives Rash  . Tetracycline Rash      Family History  Problem Relation Age of Onset  . Heart attack Mother 39     Social History [name] reports that he quit smoking about 40 years ago. His smoking use included cigarettes. He started smoking about 73 years ago. He has a 60.00 pack-year smoking history. He has never used smokeless tobacco. [name] reports no history of alcohol use.   Review of Systems CONSTITUTIONAL: No weight loss, fever, chills, weakness or fatigue.  HEENT: Eyes: No visual loss, blurred vision, double vision or yellow sclerae.No hearing loss, sneezing, congestion, runny nose or sore throat.  SKIN: No rash or itching.  CARDIOVASCULAR: per hpi RESPIRATORY: per hpi GASTROINTESTINAL: No anorexia, nausea, vomiting or diarrhea. No abdominal pain or blood.  GENITOURINARY: No burning on urination, no polyuria NEUROLOGICAL: No headache, dizziness, syncope, paralysis, ataxia, numbness or tingling in the extremities. No change in bowel or bladder control.  MUSCULOSKELETAL: No muscle, back pain, joint pain or stiffness.  LYMPHATICS: No enlarged nodes. No history of splenectomy.  PSYCHIATRIC: No history of depression or anxiety.  ENDOCRINOLOGIC: No reports of sweating, cold or heat intolerance. No polyuria or polydipsia.  Marland Kitchen   Physical Examination Today's Vitals   06/30/20 1515  BP: 120/70  Pulse: 74  SpO2: 98%   Weight: 194 lb 3.2 oz (88.1 kg)  Height: 5\' 11"  (1.803 m)   Body mass index is 27.09 kg/m.  Gen: resting comfortably, no acute distress HEENT: no scleral icterus, pupils equal round and reactive, no palptable cervical adenopathy,  CV: RRR, no m/r/g no jvd Resp: mild crackles bilateral bases GI: abdomen is soft, non-tender, non-distended, normal bowel sounds, no hepatosplenomegaly MSK: extremities are warm, trace bilaterla edema Skin: warm, no rash Neuro:  no focal deficits Psych: appropriate affect   Diagnostic Studies Echocardiogram: 05/2018 IMPRESSIONS  1. The left ventricle has moderately reduced systolic function, with an ejection fraction of 35-40%. The cavity size was normal. There is mildly increased left ventricular wall  thickness. Left ventricular diastolic Doppler parameters are consistent with impaired relaxation Indeterminent filling pressures Left ventricular diffuse hypokinesis. 2. Left atrial size was mildly dilated. 3. The mitral valve is normal in structure. There is mild mitral annular calcification present. 4. The tricuspid valve is normal in structure. 5. The aortic valve is tricuspid There is mild thickening and sclerosis without any evidence of stenosis of the aortic valve. Aortic valve regurgitation is trivial by color flow Doppler. 6. There is mild dilatation of the aortic root. 7. Right atrial pressure is estimated at 10 mmHg. 8. The interatrial septum was not well visualized.   Cardiac Catheterization: 02/15/2019  Prox RCA lesion is 75% stenosed.  RPDA lesion is 99% stenosed. SVG to PDA is patent with stenosis and was treated.  Ost LAD to Prox LAD lesion is 95% stenosed.  2nd Mrg lesion is 100% stenosed.  Mid LAD lesion is 100% stenosed. Jump graft LIMA to diag/LAD is widely patent.  SVG to PDA Origin to Prox Graft lesion before RPDA is 80% stenosed.  A drug-eluting stent was successfully placed using a STENT SYNERGY DES  3.5X20.  Post intervention, there is a 5% residual stenosis.  Jump graft between RPDA and RPAV is 100% stenosed.  LV end diastolic pressure is moderately elevated.  There is no aortic valve stenosis.  Severe three vessel disease. Patent grafts to OM, LAD and diagonal. PCI to SVG to PDA.   Continue DAPT with aggressive secondary prevention. Watch overnight on telemetry. Likely discharge tomorrow.  Minimal fluids post cath due to LV dysfunction and moderately elevated LVEDP.   Hold ACE-I until creatinine rechecked.    Assessment and Plan  1. CAD - no recent chest pain, continue current meds  2. Acute on chronic systolic HF - mild fluid overload, may be the cause of his SOb - increase lasix to 80mg  bid, check BMET/Mg/BNP 2 weeks - contoinue toprol, f/u labs. I assume his CKD 3 has limited CHF regimen.        Arnoldo Lenis, M.D.

## 2020-07-04 DIAGNOSIS — Z20828 Contact with and (suspected) exposure to other viral communicable diseases: Secondary | ICD-10-CM | POA: Diagnosis not present

## 2020-07-04 DIAGNOSIS — E119 Type 2 diabetes mellitus without complications: Secondary | ICD-10-CM | POA: Diagnosis not present

## 2020-07-04 DIAGNOSIS — B351 Tinea unguium: Secondary | ICD-10-CM | POA: Diagnosis not present

## 2020-07-04 DIAGNOSIS — U071 COVID-19: Secondary | ICD-10-CM | POA: Diagnosis not present

## 2020-07-04 DIAGNOSIS — M79672 Pain in left foot: Secondary | ICD-10-CM | POA: Diagnosis not present

## 2020-07-07 DIAGNOSIS — F0391 Unspecified dementia with behavioral disturbance: Secondary | ICD-10-CM | POA: Diagnosis not present

## 2020-07-07 DIAGNOSIS — F419 Anxiety disorder, unspecified: Secondary | ICD-10-CM | POA: Diagnosis not present

## 2020-07-07 DIAGNOSIS — F5101 Primary insomnia: Secondary | ICD-10-CM | POA: Diagnosis not present

## 2020-07-09 DIAGNOSIS — I10 Essential (primary) hypertension: Secondary | ICD-10-CM | POA: Diagnosis not present

## 2020-07-09 DIAGNOSIS — G4700 Insomnia, unspecified: Secondary | ICD-10-CM | POA: Diagnosis not present

## 2020-07-09 DIAGNOSIS — J302 Other seasonal allergic rhinitis: Secondary | ICD-10-CM | POA: Diagnosis not present

## 2020-07-09 DIAGNOSIS — M6281 Muscle weakness (generalized): Secondary | ICD-10-CM | POA: Diagnosis not present

## 2020-07-09 DIAGNOSIS — E119 Type 2 diabetes mellitus without complications: Secondary | ICD-10-CM | POA: Diagnosis not present

## 2020-07-09 DIAGNOSIS — F419 Anxiety disorder, unspecified: Secondary | ICD-10-CM | POA: Diagnosis not present

## 2020-07-09 DIAGNOSIS — R296 Repeated falls: Secondary | ICD-10-CM | POA: Diagnosis not present

## 2020-07-09 DIAGNOSIS — I5020 Unspecified systolic (congestive) heart failure: Secondary | ICD-10-CM | POA: Diagnosis not present

## 2020-07-09 DIAGNOSIS — F329 Major depressive disorder, single episode, unspecified: Secondary | ICD-10-CM | POA: Diagnosis not present

## 2020-07-09 DIAGNOSIS — R262 Difficulty in walking, not elsewhere classified: Secondary | ICD-10-CM | POA: Diagnosis not present

## 2020-07-09 DIAGNOSIS — I5040 Unspecified combined systolic (congestive) and diastolic (congestive) heart failure: Secondary | ICD-10-CM | POA: Diagnosis not present

## 2020-07-09 DIAGNOSIS — N400 Enlarged prostate without lower urinary tract symptoms: Secondary | ICD-10-CM | POA: Diagnosis not present

## 2020-07-09 DIAGNOSIS — R278 Other lack of coordination: Secondary | ICD-10-CM | POA: Diagnosis not present

## 2020-07-09 DIAGNOSIS — F0391 Unspecified dementia with behavioral disturbance: Secondary | ICD-10-CM | POA: Diagnosis not present

## 2020-07-09 DIAGNOSIS — B354 Tinea corporis: Secondary | ICD-10-CM | POA: Diagnosis not present

## 2020-07-09 DIAGNOSIS — E785 Hyperlipidemia, unspecified: Secondary | ICD-10-CM | POA: Diagnosis not present

## 2020-07-11 DIAGNOSIS — I5020 Unspecified systolic (congestive) heart failure: Secondary | ICD-10-CM | POA: Diagnosis not present

## 2020-07-11 DIAGNOSIS — R278 Other lack of coordination: Secondary | ICD-10-CM | POA: Diagnosis not present

## 2020-07-11 DIAGNOSIS — R262 Difficulty in walking, not elsewhere classified: Secondary | ICD-10-CM | POA: Diagnosis not present

## 2020-07-11 DIAGNOSIS — I10 Essential (primary) hypertension: Secondary | ICD-10-CM | POA: Diagnosis not present

## 2020-07-11 DIAGNOSIS — M6281 Muscle weakness (generalized): Secondary | ICD-10-CM | POA: Diagnosis not present

## 2020-07-11 DIAGNOSIS — R296 Repeated falls: Secondary | ICD-10-CM | POA: Diagnosis not present

## 2020-07-14 DIAGNOSIS — R262 Difficulty in walking, not elsewhere classified: Secondary | ICD-10-CM | POA: Diagnosis not present

## 2020-07-14 DIAGNOSIS — R296 Repeated falls: Secondary | ICD-10-CM | POA: Diagnosis not present

## 2020-07-14 DIAGNOSIS — I5020 Unspecified systolic (congestive) heart failure: Secondary | ICD-10-CM | POA: Diagnosis not present

## 2020-07-14 DIAGNOSIS — R278 Other lack of coordination: Secondary | ICD-10-CM | POA: Diagnosis not present

## 2020-07-14 DIAGNOSIS — M6281 Muscle weakness (generalized): Secondary | ICD-10-CM | POA: Diagnosis not present

## 2020-07-14 DIAGNOSIS — I10 Essential (primary) hypertension: Secondary | ICD-10-CM | POA: Diagnosis not present

## 2020-07-15 DIAGNOSIS — Z79899 Other long term (current) drug therapy: Secondary | ICD-10-CM | POA: Diagnosis not present

## 2020-07-15 DIAGNOSIS — E7849 Other hyperlipidemia: Secondary | ICD-10-CM | POA: Diagnosis not present

## 2020-07-15 DIAGNOSIS — E612 Magnesium deficiency: Secondary | ICD-10-CM | POA: Diagnosis not present

## 2020-07-15 DIAGNOSIS — I5040 Unspecified combined systolic (congestive) and diastolic (congestive) heart failure: Secondary | ICD-10-CM | POA: Diagnosis not present

## 2020-07-15 DIAGNOSIS — E119 Type 2 diabetes mellitus without complications: Secondary | ICD-10-CM | POA: Diagnosis not present

## 2020-07-15 DIAGNOSIS — D518 Other vitamin B12 deficiency anemias: Secondary | ICD-10-CM | POA: Diagnosis not present

## 2020-07-16 DIAGNOSIS — F4320 Adjustment disorder, unspecified: Secondary | ICD-10-CM | POA: Diagnosis not present

## 2020-07-16 DIAGNOSIS — E785 Hyperlipidemia, unspecified: Secondary | ICD-10-CM | POA: Diagnosis not present

## 2020-07-16 DIAGNOSIS — F411 Generalized anxiety disorder: Secondary | ICD-10-CM | POA: Diagnosis not present

## 2020-07-16 DIAGNOSIS — E559 Vitamin D deficiency, unspecified: Secondary | ICD-10-CM | POA: Diagnosis not present

## 2020-07-16 DIAGNOSIS — I10 Essential (primary) hypertension: Secondary | ICD-10-CM | POA: Diagnosis not present

## 2020-07-16 DIAGNOSIS — E038 Other specified hypothyroidism: Secondary | ICD-10-CM | POA: Diagnosis not present

## 2020-07-16 DIAGNOSIS — N400 Enlarged prostate without lower urinary tract symptoms: Secondary | ICD-10-CM | POA: Diagnosis not present

## 2020-07-16 DIAGNOSIS — F329 Major depressive disorder, single episode, unspecified: Secondary | ICD-10-CM | POA: Diagnosis not present

## 2020-07-16 DIAGNOSIS — F0391 Unspecified dementia with behavioral disturbance: Secondary | ICD-10-CM | POA: Diagnosis not present

## 2020-07-16 DIAGNOSIS — I509 Heart failure, unspecified: Secondary | ICD-10-CM | POA: Diagnosis not present

## 2020-07-16 DIAGNOSIS — D518 Other vitamin B12 deficiency anemias: Secondary | ICD-10-CM | POA: Diagnosis not present

## 2020-07-16 DIAGNOSIS — F419 Anxiety disorder, unspecified: Secondary | ICD-10-CM | POA: Diagnosis not present

## 2020-07-16 DIAGNOSIS — I2510 Atherosclerotic heart disease of native coronary artery without angina pectoris: Secondary | ICD-10-CM | POA: Diagnosis not present

## 2020-07-16 DIAGNOSIS — E119 Type 2 diabetes mellitus without complications: Secondary | ICD-10-CM | POA: Diagnosis not present

## 2020-07-16 DIAGNOSIS — M1990 Unspecified osteoarthritis, unspecified site: Secondary | ICD-10-CM | POA: Diagnosis not present

## 2020-07-17 DIAGNOSIS — F5101 Primary insomnia: Secondary | ICD-10-CM | POA: Diagnosis not present

## 2020-07-17 DIAGNOSIS — F0391 Unspecified dementia with behavioral disturbance: Secondary | ICD-10-CM | POA: Diagnosis not present

## 2020-07-17 DIAGNOSIS — F419 Anxiety disorder, unspecified: Secondary | ICD-10-CM | POA: Diagnosis not present

## 2020-07-18 DIAGNOSIS — R262 Difficulty in walking, not elsewhere classified: Secondary | ICD-10-CM | POA: Diagnosis not present

## 2020-07-18 DIAGNOSIS — I10 Essential (primary) hypertension: Secondary | ICD-10-CM | POA: Diagnosis not present

## 2020-07-18 DIAGNOSIS — I5020 Unspecified systolic (congestive) heart failure: Secondary | ICD-10-CM | POA: Diagnosis not present

## 2020-07-18 DIAGNOSIS — R278 Other lack of coordination: Secondary | ICD-10-CM | POA: Diagnosis not present

## 2020-07-18 DIAGNOSIS — R296 Repeated falls: Secondary | ICD-10-CM | POA: Diagnosis not present

## 2020-07-18 DIAGNOSIS — M6281 Muscle weakness (generalized): Secondary | ICD-10-CM | POA: Diagnosis not present

## 2020-08-06 DIAGNOSIS — F0391 Unspecified dementia with behavioral disturbance: Secondary | ICD-10-CM | POA: Diagnosis not present

## 2020-08-06 DIAGNOSIS — G2581 Restless legs syndrome: Secondary | ICD-10-CM | POA: Diagnosis not present

## 2020-08-06 DIAGNOSIS — M1990 Unspecified osteoarthritis, unspecified site: Secondary | ICD-10-CM | POA: Diagnosis not present

## 2020-08-06 DIAGNOSIS — E119 Type 2 diabetes mellitus without complications: Secondary | ICD-10-CM | POA: Diagnosis not present

## 2020-08-06 DIAGNOSIS — R1310 Dysphagia, unspecified: Secondary | ICD-10-CM | POA: Diagnosis not present

## 2020-08-06 DIAGNOSIS — N400 Enlarged prostate without lower urinary tract symptoms: Secondary | ICD-10-CM | POA: Diagnosis not present

## 2020-08-06 DIAGNOSIS — I10 Essential (primary) hypertension: Secondary | ICD-10-CM | POA: Diagnosis not present

## 2020-08-06 DIAGNOSIS — J302 Other seasonal allergic rhinitis: Secondary | ICD-10-CM | POA: Diagnosis not present

## 2020-08-13 DIAGNOSIS — F419 Anxiety disorder, unspecified: Secondary | ICD-10-CM | POA: Diagnosis not present

## 2020-08-13 DIAGNOSIS — F4320 Adjustment disorder, unspecified: Secondary | ICD-10-CM | POA: Diagnosis not present

## 2020-08-18 DIAGNOSIS — L309 Dermatitis, unspecified: Secondary | ICD-10-CM | POA: Diagnosis not present

## 2020-08-18 DIAGNOSIS — L308 Other specified dermatitis: Secondary | ICD-10-CM | POA: Diagnosis not present

## 2020-08-18 DIAGNOSIS — D485 Neoplasm of uncertain behavior of skin: Secondary | ICD-10-CM | POA: Diagnosis not present

## 2020-08-19 DIAGNOSIS — F0391 Unspecified dementia with behavioral disturbance: Secondary | ICD-10-CM | POA: Diagnosis not present

## 2020-08-19 DIAGNOSIS — N400 Enlarged prostate without lower urinary tract symptoms: Secondary | ICD-10-CM | POA: Diagnosis not present

## 2020-08-19 DIAGNOSIS — I509 Heart failure, unspecified: Secondary | ICD-10-CM | POA: Diagnosis not present

## 2020-08-19 DIAGNOSIS — F329 Major depressive disorder, single episode, unspecified: Secondary | ICD-10-CM | POA: Diagnosis not present

## 2020-08-19 DIAGNOSIS — E785 Hyperlipidemia, unspecified: Secondary | ICD-10-CM | POA: Diagnosis not present

## 2020-08-19 DIAGNOSIS — E038 Other specified hypothyroidism: Secondary | ICD-10-CM | POA: Diagnosis not present

## 2020-08-19 DIAGNOSIS — D518 Other vitamin B12 deficiency anemias: Secondary | ICD-10-CM | POA: Diagnosis not present

## 2020-08-19 DIAGNOSIS — E119 Type 2 diabetes mellitus without complications: Secondary | ICD-10-CM | POA: Diagnosis not present

## 2020-08-19 DIAGNOSIS — M1990 Unspecified osteoarthritis, unspecified site: Secondary | ICD-10-CM | POA: Diagnosis not present

## 2020-08-19 DIAGNOSIS — I10 Essential (primary) hypertension: Secondary | ICD-10-CM | POA: Diagnosis not present

## 2020-08-19 DIAGNOSIS — I2510 Atherosclerotic heart disease of native coronary artery without angina pectoris: Secondary | ICD-10-CM | POA: Diagnosis not present

## 2020-08-19 DIAGNOSIS — E559 Vitamin D deficiency, unspecified: Secondary | ICD-10-CM | POA: Diagnosis not present

## 2020-08-21 DIAGNOSIS — F0391 Unspecified dementia with behavioral disturbance: Secondary | ICD-10-CM | POA: Diagnosis not present

## 2020-08-21 DIAGNOSIS — F5101 Primary insomnia: Secondary | ICD-10-CM | POA: Diagnosis not present

## 2020-08-21 DIAGNOSIS — F419 Anxiety disorder, unspecified: Secondary | ICD-10-CM | POA: Diagnosis not present

## 2020-09-01 DIAGNOSIS — L2082 Flexural eczema: Secondary | ICD-10-CM | POA: Diagnosis not present

## 2020-09-03 DIAGNOSIS — I10 Essential (primary) hypertension: Secondary | ICD-10-CM | POA: Diagnosis not present

## 2020-09-03 DIAGNOSIS — N189 Chronic kidney disease, unspecified: Secondary | ICD-10-CM | POA: Diagnosis not present

## 2020-09-03 DIAGNOSIS — E785 Hyperlipidemia, unspecified: Secondary | ICD-10-CM | POA: Diagnosis not present

## 2020-09-03 DIAGNOSIS — G8929 Other chronic pain: Secondary | ICD-10-CM | POA: Diagnosis not present

## 2020-09-03 DIAGNOSIS — M1990 Unspecified osteoarthritis, unspecified site: Secondary | ICD-10-CM | POA: Diagnosis not present

## 2020-09-03 DIAGNOSIS — E119 Type 2 diabetes mellitus without complications: Secondary | ICD-10-CM | POA: Diagnosis not present

## 2020-09-03 DIAGNOSIS — I5040 Unspecified combined systolic (congestive) and diastolic (congestive) heart failure: Secondary | ICD-10-CM | POA: Diagnosis not present

## 2020-09-09 DIAGNOSIS — I2510 Atherosclerotic heart disease of native coronary artery without angina pectoris: Secondary | ICD-10-CM | POA: Diagnosis not present

## 2020-09-09 DIAGNOSIS — E119 Type 2 diabetes mellitus without complications: Secondary | ICD-10-CM | POA: Diagnosis not present

## 2020-09-09 DIAGNOSIS — M1990 Unspecified osteoarthritis, unspecified site: Secondary | ICD-10-CM | POA: Diagnosis not present

## 2020-09-09 DIAGNOSIS — N400 Enlarged prostate without lower urinary tract symptoms: Secondary | ICD-10-CM | POA: Diagnosis not present

## 2020-09-09 DIAGNOSIS — E559 Vitamin D deficiency, unspecified: Secondary | ICD-10-CM | POA: Diagnosis not present

## 2020-09-09 DIAGNOSIS — D518 Other vitamin B12 deficiency anemias: Secondary | ICD-10-CM | POA: Diagnosis not present

## 2020-09-09 DIAGNOSIS — F329 Major depressive disorder, single episode, unspecified: Secondary | ICD-10-CM | POA: Diagnosis not present

## 2020-09-09 DIAGNOSIS — E038 Other specified hypothyroidism: Secondary | ICD-10-CM | POA: Diagnosis not present

## 2020-09-09 DIAGNOSIS — I10 Essential (primary) hypertension: Secondary | ICD-10-CM | POA: Diagnosis not present

## 2020-09-09 DIAGNOSIS — E785 Hyperlipidemia, unspecified: Secondary | ICD-10-CM | POA: Diagnosis not present

## 2020-09-09 DIAGNOSIS — F0391 Unspecified dementia with behavioral disturbance: Secondary | ICD-10-CM | POA: Diagnosis not present

## 2020-09-09 DIAGNOSIS — I509 Heart failure, unspecified: Secondary | ICD-10-CM | POA: Diagnosis not present

## 2020-09-16 DIAGNOSIS — F4320 Adjustment disorder, unspecified: Secondary | ICD-10-CM | POA: Diagnosis not present

## 2020-09-16 DIAGNOSIS — F419 Anxiety disorder, unspecified: Secondary | ICD-10-CM | POA: Diagnosis not present

## 2020-09-18 DIAGNOSIS — F0391 Unspecified dementia with behavioral disturbance: Secondary | ICD-10-CM | POA: Diagnosis not present

## 2020-09-18 DIAGNOSIS — F419 Anxiety disorder, unspecified: Secondary | ICD-10-CM | POA: Diagnosis not present

## 2020-09-18 DIAGNOSIS — F5101 Primary insomnia: Secondary | ICD-10-CM | POA: Diagnosis not present

## 2020-10-01 DIAGNOSIS — N400 Enlarged prostate without lower urinary tract symptoms: Secondary | ICD-10-CM | POA: Diagnosis not present

## 2020-10-01 DIAGNOSIS — I5040 Unspecified combined systolic (congestive) and diastolic (congestive) heart failure: Secondary | ICD-10-CM | POA: Diagnosis not present

## 2020-10-01 DIAGNOSIS — E119 Type 2 diabetes mellitus without complications: Secondary | ICD-10-CM | POA: Diagnosis not present

## 2020-10-01 DIAGNOSIS — E785 Hyperlipidemia, unspecified: Secondary | ICD-10-CM | POA: Diagnosis not present

## 2020-10-01 DIAGNOSIS — G4700 Insomnia, unspecified: Secondary | ICD-10-CM | POA: Diagnosis not present

## 2020-10-01 DIAGNOSIS — N189 Chronic kidney disease, unspecified: Secondary | ICD-10-CM | POA: Diagnosis not present

## 2020-10-01 DIAGNOSIS — J302 Other seasonal allergic rhinitis: Secondary | ICD-10-CM | POA: Diagnosis not present

## 2020-10-01 DIAGNOSIS — M1990 Unspecified osteoarthritis, unspecified site: Secondary | ICD-10-CM | POA: Diagnosis not present

## 2020-10-01 DIAGNOSIS — G2581 Restless legs syndrome: Secondary | ICD-10-CM | POA: Diagnosis not present

## 2020-10-02 DIAGNOSIS — E785 Hyperlipidemia, unspecified: Secondary | ICD-10-CM | POA: Diagnosis not present

## 2020-10-02 DIAGNOSIS — M1990 Unspecified osteoarthritis, unspecified site: Secondary | ICD-10-CM | POA: Diagnosis not present

## 2020-10-02 DIAGNOSIS — F329 Major depressive disorder, single episode, unspecified: Secondary | ICD-10-CM | POA: Diagnosis not present

## 2020-10-02 DIAGNOSIS — E559 Vitamin D deficiency, unspecified: Secondary | ICD-10-CM | POA: Diagnosis not present

## 2020-10-02 DIAGNOSIS — N400 Enlarged prostate without lower urinary tract symptoms: Secondary | ICD-10-CM | POA: Diagnosis not present

## 2020-10-02 DIAGNOSIS — D518 Other vitamin B12 deficiency anemias: Secondary | ICD-10-CM | POA: Diagnosis not present

## 2020-10-02 DIAGNOSIS — E038 Other specified hypothyroidism: Secondary | ICD-10-CM | POA: Diagnosis not present

## 2020-10-02 DIAGNOSIS — F0391 Unspecified dementia with behavioral disturbance: Secondary | ICD-10-CM | POA: Diagnosis not present

## 2020-10-02 DIAGNOSIS — I10 Essential (primary) hypertension: Secondary | ICD-10-CM | POA: Diagnosis not present

## 2020-10-02 DIAGNOSIS — I509 Heart failure, unspecified: Secondary | ICD-10-CM | POA: Diagnosis not present

## 2020-10-02 DIAGNOSIS — I2510 Atherosclerotic heart disease of native coronary artery without angina pectoris: Secondary | ICD-10-CM | POA: Diagnosis not present

## 2020-10-02 DIAGNOSIS — E119 Type 2 diabetes mellitus without complications: Secondary | ICD-10-CM | POA: Diagnosis not present

## 2020-10-02 DIAGNOSIS — R300 Dysuria: Secondary | ICD-10-CM | POA: Diagnosis not present

## 2020-10-06 DIAGNOSIS — L2082 Flexural eczema: Secondary | ICD-10-CM | POA: Diagnosis not present

## 2020-10-06 DIAGNOSIS — L271 Localized skin eruption due to drugs and medicaments taken internally: Secondary | ICD-10-CM | POA: Diagnosis not present

## 2020-10-13 ENCOUNTER — Encounter: Payer: Self-pay | Admitting: Cardiology

## 2020-10-13 ENCOUNTER — Ambulatory Visit (INDEPENDENT_AMBULATORY_CARE_PROVIDER_SITE_OTHER): Payer: Medicare Other | Admitting: Cardiology

## 2020-10-13 ENCOUNTER — Other Ambulatory Visit: Payer: Self-pay

## 2020-10-13 VITALS — BP 144/87 | HR 68 | Ht 71.0 in | Wt 188.2 lb

## 2020-10-13 DIAGNOSIS — I5023 Acute on chronic systolic (congestive) heart failure: Secondary | ICD-10-CM

## 2020-10-13 DIAGNOSIS — I2510 Atherosclerotic heart disease of native coronary artery without angina pectoris: Secondary | ICD-10-CM

## 2020-10-13 DIAGNOSIS — F419 Anxiety disorder, unspecified: Secondary | ICD-10-CM | POA: Diagnosis not present

## 2020-10-13 DIAGNOSIS — F4320 Adjustment disorder, unspecified: Secondary | ICD-10-CM | POA: Diagnosis not present

## 2020-10-13 NOTE — Patient Instructions (Signed)
Medication Instructions:  Your physician recommends that you continue on your current medications as directed. Please refer to the Current Medication list given to you today.   Dr.Branch will review your outside lab work and call you with any medication changes.  *If you need a refill on your cardiac medications before your next appointment, please call your pharmacy*   Lab Work: None today    If you have labs (blood work) drawn today and your tests are completely normal, you will receive your results only by: [place] (if you have MyChart) OR A paper copy in the mail If you have any lab test that is abnormal or we need to change your treatment, we will call you to review the results.   Testing/Procedures: None today    Follow-Up: At [facility], you and your health needs are our priority.  As part of our continuing mission to provide you with exceptional heart care, we have created designated Provider Care Teams.  These Care Teams include your primary Cardiologist (physician) and Advanced Practice Providers (APPs -  Physician Assistants and Nurse Practitioners) who all work together to provide you with the care you need, when you need it.  We recommend signing up for the patient portal called "MyChart".  Sign up information is provided on this After Visit Summary.  MyChart is used to connect with patients for Virtual Visits (Telemedicine).  Patients are able to view lab/test results, encounter notes, upcoming appointments, etc.  Non-urgent messages can be sent to your provider as well.   To learn more about what you can do with MyChart, go to [email or web address].    Your next appointment:   2 month(s)  The format for your next appointment:   In Person  Provider:   Katina Dung, NP   Other Instructions None

## 2020-10-13 NOTE — Progress Notes (Signed)
Clinical Summary [name] is a 87 y.o.male seen today for follow up of the following medical problems. This is a focused visit on recent issues with CHF.       1. Chronic combined systolic/diastolic HF - 09/452 echo LVEF 35-40%, grade I dd - last visit reported worsening SOB, mild edema. Some orthopnea   - last visit increased lasix to 80mg  bid, was to have repeat labs but I don't see that these were done. - weight down from 194 lbs to 188 lbs - symptoms are improving.         Social history: His wife of 68 years passed away in 2014/07/24.  His daughter, [name], lives with him now. He has a son as well.  His daughter used to work at [employer] in [employer].   Past Medical History:  Diagnosis Date   Aortic insufficiency    a. previously mild in 24-Jul-2010. b. trivial seen on echo in 05/2018.   Asbestos exposure    Bradycardia    MILD   CAD (coronary artery disease) 07/23/01   a. s/p CABG in Jul 23, 1992. b. Cath 07-23-01 patent grafts. c. DOE in 2018/07/24 felt anginal equivalent - cath 01/2019 s/p PCI/DES to the SVG-PDA.   Cancer [facility])    Skin   Carotid artery disease ([place]) 07-24-99   BILATERAL 0-39%/  Follow with Dopplers by Dr. Woody Seller   Chronic combined systolic and diastolic CHF (congestive heart failure) ([place])    CKD (chronic kidney disease), stage III    Colitis    Diabetes mellitus    Dilated aortic root ([place]) 05/2018   Dizziness    He has had falls related to this / Chronic dizziness   Dyslipidemia    Hx of CABG    Hypertension    Nephrolithiasis    PVD (peripheral vascular disease) ([place])    Renal cyst    TIA (transient ischemic attack) 07/23/2005   ?? medical Rx      Allergies  Allergen Reactions   Sulfonamide Derivatives Rash   Tetracycline Rash     Current Outpatient Medications  Medication Sig Dispense Refill   acetaminophen (TYLENOL) 500 MG tablet Take 1,000 mg by mouth every 8 (eight) hours as needed.     albuterol (VENTOLIN HFA) 108 (90 Base) MCG/ACT inhaler Inhale 2  puffs into the lungs every 4 (four) hours as needed for wheezing or shortness of breath.     aspirin EC 81 MG tablet Take 1 tablet (81 mg total) by mouth daily.     atorvastatin (LIPITOR) 20 MG tablet Take 20 mg by mouth every morning.   4   citalopram (CELEXA) 20 MG tablet Take 20 mg by mouth daily.     clopidogrel (PLAVIX) 75 MG tablet Take 1 tablet (75 mg total) by mouth daily. (Patient taking differently: Take 75 mg by mouth every morning.) 90 tablet 3   diclofenac Sodium (VOLTAREN) 1 % GEL Apply 4 g topically 2 (two) times daily.     fluticasone (FLONASE) 50 MCG/ACT nasal spray Place 2 sprays into both nostrils daily.     furosemide (LASIX) 80 MG tablet Take 1 tablet (80 mg total) by mouth 2 (two) times daily. 60 tablet 3   gabapentin (NEURONTIN) 100 MG capsule Take 100 mg by mouth at bedtime.     loratadine (CLARITIN) 10 MG tablet Take 10 mg by mouth daily.     magnesium hydroxide (MILK OF MAGNESIA) 400 MG/5ML suspension Take 30 mLs by  mouth daily as needed for mild constipation.     metFORMIN (GLUCOPHAGE) 500 MG tablet Take 500 mg by mouth at bedtime.      metoprolol succinate (TOPROL-XL) 25 MG 24 hr tablet Take 25 mg by mouth daily.     Multiple Vitamin (MULTIVITAMIN) tablet Take 1 tablet by mouth daily.     nitroGLYCERIN (NITROSTAT) 0.4 MG SL tablet Place 0.4 mg under the tongue every 5 (five) minutes x 3 doses as needed for chest pain (if no relief after 2nd dose, proceed to the ED for an evaluation or call 911).     polyethylene glycol (MIRALAX / GLYCOLAX) 17 g packet Take 17 g by mouth daily.     potassium chloride SA (KLOR-CON) 20 MEQ tablet Take 20 mEq by mouth daily.     risperiDONE (RISPERDAL) 0.25 MG tablet Take 1 tablet (0.25 mg total) by mouth at bedtime.     rOPINIRole (REQUIP) 1 MG tablet Take 1-2 mg by mouth at bedtime as needed.     sennosides-docusate sodium (SENOKOT-S) 8.6-50 MG tablet Take 2 tablets by mouth daily as needed for constipation.     simethicone (MYLICON) 80  MG chewable tablet Chew 80 mg by mouth every 6 (six) hours as needed for flatulence.     tamsulosin (FLOMAX) 0.4 MG CAPS capsule Take 0.4 mg by mouth daily after supper.     traZODone (DESYREL) 50 MG tablet Take 50 mg by mouth at bedtime.     No current facility-administered medications for this visit.     Past Surgical History:  Procedure Laterality Date   CORONARY ARTERY BYPASS GRAFT     CORONARY STENT INTERVENTION N/A 02/15/2019   Procedure: CORONARY STENT INTERVENTION;  Surgeon: Jettie Booze, MD;  Location: [facility];  Service: Cardiovascular;  Laterality: N/A;  SEQ SVG PDA-PL   LEFT HEART CATH AND CORS/GRAFTS ANGIOGRAPHY N/A 02/15/2019   Procedure: LEFT HEART CATH AND CORS/GRAFTS ANGIOGRAPHY;  Surgeon: Jettie Booze, MD;  Location: [facility];  Service: Cardiovascular;  Laterality: N/A;   percutaneous transluminal coronary angioplasty     hx     Allergies  Allergen Reactions   Sulfonamide Derivatives Rash   Tetracycline Rash      Family History  Problem Relation Age of Onset   Heart attack Mother 6     Social History [name] reports that he quit smoking about 40 years ago. His smoking use included cigarettes. He started smoking about 73 years ago. He has a 60.00 pack-year smoking history. He has never used smokeless tobacco. [name] reports no history of alcohol use.   Review of Systems CONSTITUTIONAL: No weight loss, fever, chills, weakness or fatigue.  HEENT: Eyes: No visual loss, blurred vision, double vision or yellow sclerae.No hearing loss, sneezing, congestion, runny nose or sore throat.  SKIN: No rash or itching.  CARDIOVASCULAR: per hpi RESPIRATORY: No shortness of breath, cough or sputum.  GASTROINTESTINAL: No anorexia, nausea, vomiting or diarrhea. No abdominal pain or blood.  GENITOURINARY: No burning on urination, no polyuria NEUROLOGICAL: No headache, dizziness, syncope, paralysis, ataxia, numbness or tingling in  the extremities. No change in bowel or bladder control.  MUSCULOSKELETAL: No muscle, back pain, joint pain or stiffness.  LYMPHATICS: No enlarged nodes. No history of splenectomy.  PSYCHIATRIC: No history of depression or anxiety.  ENDOCRINOLOGIC: No reports of sweating, cold or heat intolerance. No polyuria or polydipsia.  Marland Kitchen   Physical Examination Today's Vitals   10/13/20 1603  BP: Marland Kitchen)  144/87  Pulse: 68  SpO2: 98%  Weight: 188 lb 3.2 oz (85.4 kg)  Height: 5\' 11"  (1.803 m)   Body mass index is 26.25 kg/m.  Gen: resting comfortably, no acute distress HEENT: no scleral icterus, pupils equal round and reactive, no palptable cervical adenopathy,  CV: RRR, no m/r/g no jvd Resp: Clear to auscultation bilaterally GI: abdomen is soft, non-tender, non-distended, normal bowel sounds, no hepatosplenomegaly MSK: extremities are warm, no edema.  Skin: warm, no rash Neuro:  no focal deficits Psych: appropriate affect   Diagnostic Studies Echocardiogram: 05/2018 IMPRESSIONS    1. The left ventricle has moderately reduced systolic function, with an ejection fraction of 35-40%. The cavity size was normal. There is mildly increased left ventricular wall thickness. Left ventricular diastolic Doppler parameters are consistent with  impaired relaxation Indeterminent filling pressures Left ventricular diffuse hypokinesis.  2. Left atrial size was mildly dilated.  3. The mitral valve is normal in structure. There is mild mitral annular calcification present.  4. The tricuspid valve is normal in structure.  5. The aortic valve is tricuspid There is mild thickening and sclerosis without any evidence of stenosis of the aortic valve. Aortic valve regurgitation is trivial by color flow Doppler.  6. There is mild dilatation of the aortic root.  7. Right atrial pressure is estimated at 10 mmHg.  8. The interatrial septum was not well visualized.     Cardiac Catheterization: 02/15/2019 Prox RCA  lesion is 75% stenosed. RPDA lesion is 99% stenosed. SVG to PDA is patent with stenosis and was treated. Ost LAD to Prox LAD lesion is 95% stenosed. 2nd Mrg lesion is 100% stenosed. Mid LAD lesion is 100% stenosed. Jump graft LIMA to diag/LAD is widely patent. SVG to PDA Origin to Prox Graft lesion before RPDA is 80% stenosed. A drug-eluting stent was successfully placed using a STENT SYNERGY DES 3.5X20. Post intervention, there is a 5% residual stenosis. Jump graft between RPDA and RPAV is 100% stenosed. LV end diastolic pressure is moderately elevated. There is no aortic valve stenosis.   Severe three vessel disease.  Patent grafts to OM, LAD and diagonal.  PCI to SVG to PDA.     Continue DAPT with aggressive secondary prevention.  Watch overnight on telemetry.  Likely discharge tomorrow.   Minimal fluids post cath due to LV dysfunction and moderately elevated LVEDP.   Hold ACE-I until creatinine rechecked.     Assessment and Plan  1. Acute on chronic systolic HF - symptoms improving with diuretic change, continue current regimen - f/u labs      Arnoldo Lenis, M.D.

## 2020-10-14 DIAGNOSIS — M6281 Muscle weakness (generalized): Secondary | ICD-10-CM | POA: Diagnosis not present

## 2020-10-14 DIAGNOSIS — R279 Unspecified lack of coordination: Secondary | ICD-10-CM | POA: Diagnosis not present

## 2020-10-14 DIAGNOSIS — Z9181 History of falling: Secondary | ICD-10-CM | POA: Diagnosis not present

## 2020-10-14 DIAGNOSIS — I10 Essential (primary) hypertension: Secondary | ICD-10-CM | POA: Diagnosis not present

## 2020-10-16 DIAGNOSIS — M6281 Muscle weakness (generalized): Secondary | ICD-10-CM | POA: Diagnosis not present

## 2020-10-16 DIAGNOSIS — Z9181 History of falling: Secondary | ICD-10-CM | POA: Diagnosis not present

## 2020-10-16 DIAGNOSIS — I10 Essential (primary) hypertension: Secondary | ICD-10-CM | POA: Diagnosis not present

## 2020-10-16 DIAGNOSIS — R279 Unspecified lack of coordination: Secondary | ICD-10-CM | POA: Diagnosis not present

## 2020-10-18 DIAGNOSIS — R279 Unspecified lack of coordination: Secondary | ICD-10-CM | POA: Diagnosis not present

## 2020-10-18 DIAGNOSIS — Z9181 History of falling: Secondary | ICD-10-CM | POA: Diagnosis not present

## 2020-10-18 DIAGNOSIS — I10 Essential (primary) hypertension: Secondary | ICD-10-CM | POA: Diagnosis not present

## 2020-10-18 DIAGNOSIS — M6281 Muscle weakness (generalized): Secondary | ICD-10-CM | POA: Diagnosis not present

## 2020-10-20 DIAGNOSIS — R279 Unspecified lack of coordination: Secondary | ICD-10-CM | POA: Diagnosis not present

## 2020-10-20 DIAGNOSIS — I10 Essential (primary) hypertension: Secondary | ICD-10-CM | POA: Diagnosis not present

## 2020-10-20 DIAGNOSIS — M6281 Muscle weakness (generalized): Secondary | ICD-10-CM | POA: Diagnosis not present

## 2020-10-20 DIAGNOSIS — Z9181 History of falling: Secondary | ICD-10-CM | POA: Diagnosis not present

## 2020-10-22 DIAGNOSIS — I10 Essential (primary) hypertension: Secondary | ICD-10-CM | POA: Diagnosis not present

## 2020-10-22 DIAGNOSIS — R279 Unspecified lack of coordination: Secondary | ICD-10-CM | POA: Diagnosis not present

## 2020-10-22 DIAGNOSIS — M6281 Muscle weakness (generalized): Secondary | ICD-10-CM | POA: Diagnosis not present

## 2020-10-22 DIAGNOSIS — Z9181 History of falling: Secondary | ICD-10-CM | POA: Diagnosis not present

## 2020-10-23 DIAGNOSIS — F419 Anxiety disorder, unspecified: Secondary | ICD-10-CM | POA: Diagnosis not present

## 2020-10-23 DIAGNOSIS — F0391 Unspecified dementia with behavioral disturbance: Secondary | ICD-10-CM | POA: Diagnosis not present

## 2020-10-23 DIAGNOSIS — F5101 Primary insomnia: Secondary | ICD-10-CM | POA: Diagnosis not present

## 2020-10-23 DIAGNOSIS — I10 Essential (primary) hypertension: Secondary | ICD-10-CM | POA: Diagnosis not present

## 2020-10-27 DIAGNOSIS — R279 Unspecified lack of coordination: Secondary | ICD-10-CM | POA: Diagnosis not present

## 2020-10-27 DIAGNOSIS — I10 Essential (primary) hypertension: Secondary | ICD-10-CM | POA: Diagnosis not present

## 2020-10-27 DIAGNOSIS — M6281 Muscle weakness (generalized): Secondary | ICD-10-CM | POA: Diagnosis not present

## 2020-10-27 DIAGNOSIS — Z9181 History of falling: Secondary | ICD-10-CM | POA: Diagnosis not present

## 2020-10-29 DIAGNOSIS — M6281 Muscle weakness (generalized): Secondary | ICD-10-CM | POA: Diagnosis not present

## 2020-10-29 DIAGNOSIS — F4320 Adjustment disorder, unspecified: Secondary | ICD-10-CM | POA: Diagnosis not present

## 2020-10-29 DIAGNOSIS — F419 Anxiety disorder, unspecified: Secondary | ICD-10-CM | POA: Diagnosis not present

## 2020-10-29 DIAGNOSIS — R279 Unspecified lack of coordination: Secondary | ICD-10-CM | POA: Diagnosis not present

## 2020-10-29 DIAGNOSIS — I10 Essential (primary) hypertension: Secondary | ICD-10-CM | POA: Diagnosis not present

## 2020-10-29 DIAGNOSIS — Z9181 History of falling: Secondary | ICD-10-CM | POA: Diagnosis not present

## 2020-10-30 DIAGNOSIS — N400 Enlarged prostate without lower urinary tract symptoms: Secondary | ICD-10-CM | POA: Diagnosis not present

## 2020-10-30 DIAGNOSIS — E785 Hyperlipidemia, unspecified: Secondary | ICD-10-CM | POA: Diagnosis not present

## 2020-10-30 DIAGNOSIS — F0391 Unspecified dementia with behavioral disturbance: Secondary | ICD-10-CM | POA: Diagnosis not present

## 2020-10-30 DIAGNOSIS — M1990 Unspecified osteoarthritis, unspecified site: Secondary | ICD-10-CM | POA: Diagnosis not present

## 2020-10-30 DIAGNOSIS — N189 Chronic kidney disease, unspecified: Secondary | ICD-10-CM | POA: Diagnosis not present

## 2020-10-30 DIAGNOSIS — M25561 Pain in right knee: Secondary | ICD-10-CM | POA: Diagnosis not present

## 2020-11-01 DIAGNOSIS — Z9181 History of falling: Secondary | ICD-10-CM | POA: Diagnosis not present

## 2020-11-01 DIAGNOSIS — I10 Essential (primary) hypertension: Secondary | ICD-10-CM | POA: Diagnosis not present

## 2020-11-01 DIAGNOSIS — M6281 Muscle weakness (generalized): Secondary | ICD-10-CM | POA: Diagnosis not present

## 2020-11-01 DIAGNOSIS — R279 Unspecified lack of coordination: Secondary | ICD-10-CM | POA: Diagnosis not present

## 2020-11-04 DIAGNOSIS — I10 Essential (primary) hypertension: Secondary | ICD-10-CM | POA: Diagnosis not present

## 2020-11-04 DIAGNOSIS — Z9181 History of falling: Secondary | ICD-10-CM | POA: Diagnosis not present

## 2020-11-04 DIAGNOSIS — R279 Unspecified lack of coordination: Secondary | ICD-10-CM | POA: Diagnosis not present

## 2020-11-04 DIAGNOSIS — M6281 Muscle weakness (generalized): Secondary | ICD-10-CM | POA: Diagnosis not present

## 2020-11-06 DIAGNOSIS — Z9181 History of falling: Secondary | ICD-10-CM | POA: Diagnosis not present

## 2020-11-06 DIAGNOSIS — I10 Essential (primary) hypertension: Secondary | ICD-10-CM | POA: Diagnosis not present

## 2020-11-06 DIAGNOSIS — M6281 Muscle weakness (generalized): Secondary | ICD-10-CM | POA: Diagnosis not present

## 2020-11-06 DIAGNOSIS — R279 Unspecified lack of coordination: Secondary | ICD-10-CM | POA: Diagnosis not present

## 2020-11-10 DIAGNOSIS — I2510 Atherosclerotic heart disease of native coronary artery without angina pectoris: Secondary | ICD-10-CM | POA: Diagnosis not present

## 2020-11-10 DIAGNOSIS — M6281 Muscle weakness (generalized): Secondary | ICD-10-CM | POA: Diagnosis not present

## 2020-11-10 DIAGNOSIS — D518 Other vitamin B12 deficiency anemias: Secondary | ICD-10-CM | POA: Diagnosis not present

## 2020-11-10 DIAGNOSIS — N400 Enlarged prostate without lower urinary tract symptoms: Secondary | ICD-10-CM | POA: Diagnosis not present

## 2020-11-10 DIAGNOSIS — Z9181 History of falling: Secondary | ICD-10-CM | POA: Diagnosis not present

## 2020-11-10 DIAGNOSIS — E119 Type 2 diabetes mellitus without complications: Secondary | ICD-10-CM | POA: Diagnosis not present

## 2020-11-10 DIAGNOSIS — E785 Hyperlipidemia, unspecified: Secondary | ICD-10-CM | POA: Diagnosis not present

## 2020-11-10 DIAGNOSIS — I509 Heart failure, unspecified: Secondary | ICD-10-CM | POA: Diagnosis not present

## 2020-11-10 DIAGNOSIS — M1990 Unspecified osteoarthritis, unspecified site: Secondary | ICD-10-CM | POA: Diagnosis not present

## 2020-11-10 DIAGNOSIS — R279 Unspecified lack of coordination: Secondary | ICD-10-CM | POA: Diagnosis not present

## 2020-11-10 DIAGNOSIS — F329 Major depressive disorder, single episode, unspecified: Secondary | ICD-10-CM | POA: Diagnosis not present

## 2020-11-10 DIAGNOSIS — E559 Vitamin D deficiency, unspecified: Secondary | ICD-10-CM | POA: Diagnosis not present

## 2020-11-10 DIAGNOSIS — F0391 Unspecified dementia with behavioral disturbance: Secondary | ICD-10-CM | POA: Diagnosis not present

## 2020-11-10 DIAGNOSIS — E038 Other specified hypothyroidism: Secondary | ICD-10-CM | POA: Diagnosis not present

## 2020-11-10 DIAGNOSIS — I10 Essential (primary) hypertension: Secondary | ICD-10-CM | POA: Diagnosis not present

## 2020-11-17 ENCOUNTER — Other Ambulatory Visit: Payer: Self-pay

## 2020-11-17 ENCOUNTER — Encounter (HOSPITAL_COMMUNITY): Payer: Self-pay | Admitting: Emergency Medicine

## 2020-11-17 ENCOUNTER — Inpatient Hospital Stay (HOSPITAL_COMMUNITY)
Admission: EM | Admit: 2020-11-17 | Discharge: 2020-11-20 | DRG: 871 | Disposition: A | Discharge status: Skilled Nursing Facility | Payer: Medicare Other | Source: Skilled Nursing Facility | Attending: Internal Medicine | Admitting: Internal Medicine

## 2020-11-17 ENCOUNTER — Emergency Department (HOSPITAL_COMMUNITY): Payer: Medicare Other

## 2020-11-17 DIAGNOSIS — Z20822 Contact with and (suspected) exposure to covid-19: Secondary | ICD-10-CM | POA: Diagnosis present

## 2020-11-17 DIAGNOSIS — N179 Acute kidney failure, unspecified: Secondary | ICD-10-CM | POA: Diagnosis present

## 2020-11-17 DIAGNOSIS — J189 Pneumonia, unspecified organism: Secondary | ICD-10-CM | POA: Diagnosis present

## 2020-11-17 DIAGNOSIS — R531 Weakness: Secondary | ICD-10-CM | POA: Diagnosis not present

## 2020-11-17 DIAGNOSIS — J181 Lobar pneumonia, unspecified organism: Secondary | ICD-10-CM

## 2020-11-17 DIAGNOSIS — N400 Enlarged prostate without lower urinary tract symptoms: Secondary | ICD-10-CM | POA: Diagnosis not present

## 2020-11-17 DIAGNOSIS — A419 Sepsis, unspecified organism: Secondary | ICD-10-CM | POA: Diagnosis present

## 2020-11-17 DIAGNOSIS — F32A Depression, unspecified: Secondary | ICD-10-CM | POA: Diagnosis present

## 2020-11-17 DIAGNOSIS — J9601 Acute respiratory failure with hypoxia: Secondary | ICD-10-CM | POA: Diagnosis present

## 2020-11-17 DIAGNOSIS — N189 Chronic kidney disease, unspecified: Secondary | ICD-10-CM | POA: Diagnosis not present

## 2020-11-17 DIAGNOSIS — Z7984 Long term (current) use of oral hypoglycemic drugs: Secondary | ICD-10-CM

## 2020-11-17 DIAGNOSIS — Z85828 Personal history of other malignant neoplasm of skin: Secondary | ICD-10-CM

## 2020-11-17 DIAGNOSIS — R509 Fever, unspecified: Secondary | ICD-10-CM | POA: Diagnosis not present

## 2020-11-17 DIAGNOSIS — G2581 Restless legs syndrome: Secondary | ICD-10-CM | POA: Diagnosis present

## 2020-11-17 DIAGNOSIS — E1151 Type 2 diabetes mellitus with diabetic peripheral angiopathy without gangrene: Secondary | ICD-10-CM | POA: Diagnosis present

## 2020-11-17 DIAGNOSIS — E785 Hyperlipidemia, unspecified: Secondary | ICD-10-CM | POA: Diagnosis present

## 2020-11-17 DIAGNOSIS — I739 Peripheral vascular disease, unspecified: Secondary | ICD-10-CM | POA: Diagnosis not present

## 2020-11-17 DIAGNOSIS — Z8673 Personal history of transient ischemic attack (TIA), and cerebral infarction without residual deficits: Secondary | ICD-10-CM

## 2020-11-17 DIAGNOSIS — I130 Hypertensive heart and chronic kidney disease with heart failure and stage 1 through stage 4 chronic kidney disease, or unspecified chronic kidney disease: Secondary | ICD-10-CM | POA: Diagnosis present

## 2020-11-17 DIAGNOSIS — Z66 Do not resuscitate: Secondary | ICD-10-CM | POA: Diagnosis present

## 2020-11-17 DIAGNOSIS — Z515 Encounter for palliative care: Secondary | ICD-10-CM | POA: Diagnosis not present

## 2020-11-17 DIAGNOSIS — E1100 Type 2 diabetes mellitus with hyperosmolarity without nonketotic hyperglycemic-hyperosmolar coma (NKHHC): Secondary | ICD-10-CM | POA: Diagnosis not present

## 2020-11-17 DIAGNOSIS — Z882 Allergy status to sulfonamides status: Secondary | ICD-10-CM

## 2020-11-17 DIAGNOSIS — E860 Dehydration: Secondary | ICD-10-CM | POA: Diagnosis present

## 2020-11-17 DIAGNOSIS — M6281 Muscle weakness (generalized): Secondary | ICD-10-CM | POA: Diagnosis not present

## 2020-11-17 DIAGNOSIS — R6520 Severe sepsis without septic shock: Secondary | ICD-10-CM

## 2020-11-17 DIAGNOSIS — F419 Anxiety disorder, unspecified: Secondary | ICD-10-CM | POA: Diagnosis present

## 2020-11-17 DIAGNOSIS — R000 Tachycardia, unspecified: Secondary | ICD-10-CM | POA: Diagnosis not present

## 2020-11-17 DIAGNOSIS — Z7401 Bed confinement status: Secondary | ICD-10-CM | POA: Diagnosis not present

## 2020-11-17 DIAGNOSIS — N3946 Mixed incontinence: Secondary | ICD-10-CM | POA: Diagnosis not present

## 2020-11-17 DIAGNOSIS — Z87891 Personal history of nicotine dependence: Secondary | ICD-10-CM

## 2020-11-17 DIAGNOSIS — Z951 Presence of aortocoronary bypass graft: Secondary | ICD-10-CM | POA: Diagnosis not present

## 2020-11-17 DIAGNOSIS — I2510 Atherosclerotic heart disease of native coronary artery without angina pectoris: Secondary | ICD-10-CM | POA: Diagnosis present

## 2020-11-17 DIAGNOSIS — Z79899 Other long term (current) drug therapy: Secondary | ICD-10-CM | POA: Diagnosis not present

## 2020-11-17 DIAGNOSIS — Z7189 Other specified counseling: Secondary | ICD-10-CM | POA: Diagnosis not present

## 2020-11-17 DIAGNOSIS — E118 Type 2 diabetes mellitus with unspecified complications: Secondary | ICD-10-CM | POA: Diagnosis not present

## 2020-11-17 DIAGNOSIS — Z77090 Contact with and (suspected) exposure to asbestos: Secondary | ICD-10-CM | POA: Diagnosis present

## 2020-11-17 DIAGNOSIS — J9811 Atelectasis: Secondary | ICD-10-CM | POA: Diagnosis not present

## 2020-11-17 DIAGNOSIS — I517 Cardiomegaly: Secondary | ICD-10-CM | POA: Diagnosis not present

## 2020-11-17 DIAGNOSIS — E1122 Type 2 diabetes mellitus with diabetic chronic kidney disease: Secondary | ICD-10-CM | POA: Diagnosis present

## 2020-11-17 DIAGNOSIS — Z888 Allergy status to other drugs, medicaments and biological substances status: Secondary | ICD-10-CM

## 2020-11-17 DIAGNOSIS — I10 Essential (primary) hypertension: Secondary | ICD-10-CM | POA: Diagnosis not present

## 2020-11-17 DIAGNOSIS — Z7902 Long term (current) use of antithrombotics/antiplatelets: Secondary | ICD-10-CM

## 2020-11-17 DIAGNOSIS — E119 Type 2 diabetes mellitus without complications: Secondary | ICD-10-CM | POA: Diagnosis not present

## 2020-11-17 DIAGNOSIS — E038 Other specified hypothyroidism: Secondary | ICD-10-CM | POA: Diagnosis not present

## 2020-11-17 DIAGNOSIS — E1165 Type 2 diabetes mellitus with hyperglycemia: Secondary | ICD-10-CM | POA: Diagnosis not present

## 2020-11-17 DIAGNOSIS — I5040 Unspecified combined systolic (congestive) and diastolic (congestive) heart failure: Secondary | ICD-10-CM | POA: Diagnosis not present

## 2020-11-17 DIAGNOSIS — F0391 Unspecified dementia with behavioral disturbance: Secondary | ICD-10-CM | POA: Diagnosis not present

## 2020-11-17 DIAGNOSIS — I5042 Chronic combined systolic (congestive) and diastolic (congestive) heart failure: Secondary | ICD-10-CM | POA: Diagnosis present

## 2020-11-17 DIAGNOSIS — Y95 Nosocomial condition: Secondary | ICD-10-CM | POA: Diagnosis present

## 2020-11-17 DIAGNOSIS — E559 Vitamin D deficiency, unspecified: Secondary | ICD-10-CM | POA: Diagnosis not present

## 2020-11-17 DIAGNOSIS — R0602 Shortness of breath: Secondary | ICD-10-CM | POA: Diagnosis not present

## 2020-11-17 DIAGNOSIS — R630 Anorexia: Secondary | ICD-10-CM | POA: Diagnosis not present

## 2020-11-17 DIAGNOSIS — Z955 Presence of coronary angioplasty implant and graft: Secondary | ICD-10-CM | POA: Diagnosis not present

## 2020-11-17 DIAGNOSIS — R0689 Other abnormalities of breathing: Secondary | ICD-10-CM | POA: Diagnosis not present

## 2020-11-17 DIAGNOSIS — R52 Pain, unspecified: Secondary | ICD-10-CM | POA: Diagnosis not present

## 2020-11-17 DIAGNOSIS — I519 Heart disease, unspecified: Secondary | ICD-10-CM | POA: Diagnosis not present

## 2020-11-17 DIAGNOSIS — J811 Chronic pulmonary edema: Secondary | ICD-10-CM | POA: Diagnosis not present

## 2020-11-17 DIAGNOSIS — N1832 Chronic kidney disease, stage 3b: Secondary | ICD-10-CM

## 2020-11-17 DIAGNOSIS — R0902 Hypoxemia: Secondary | ICD-10-CM | POA: Diagnosis not present

## 2020-11-17 DIAGNOSIS — E7849 Other hyperlipidemia: Secondary | ICD-10-CM | POA: Diagnosis not present

## 2020-11-17 DIAGNOSIS — I509 Heart failure, unspecified: Secondary | ICD-10-CM | POA: Diagnosis not present

## 2020-11-17 DIAGNOSIS — J90 Pleural effusion, not elsewhere classified: Secondary | ICD-10-CM | POA: Diagnosis not present

## 2020-11-17 DIAGNOSIS — J9620 Acute and chronic respiratory failure, unspecified whether with hypoxia or hypercapnia: Secondary | ICD-10-CM | POA: Diagnosis not present

## 2020-11-17 DIAGNOSIS — D518 Other vitamin B12 deficiency anemias: Secondary | ICD-10-CM | POA: Diagnosis not present

## 2020-11-17 DIAGNOSIS — Z9181 History of falling: Secondary | ICD-10-CM

## 2020-11-17 DIAGNOSIS — Z7982 Long term (current) use of aspirin: Secondary | ICD-10-CM

## 2020-11-17 DIAGNOSIS — R279 Unspecified lack of coordination: Secondary | ICD-10-CM | POA: Diagnosis not present

## 2020-11-17 LAB — CBC WITH DIFFERENTIAL/PLATELET
Abs Immature Granulocytes: 0.22 10*3/uL — ABNORMAL HIGH (ref 0.00–0.07)
Basophils Absolute: 0.1 10*3/uL (ref 0.0–0.1)
Basophils Relative: 0 %
Eosinophils Absolute: 0 10*3/uL (ref 0.0–0.5)
Eosinophils Relative: 0 %
HCT: 36.2 % — ABNORMAL LOW (ref 39.0–52.0)
Hemoglobin: 12.1 g/dL — ABNORMAL LOW (ref 13.0–17.0)
Immature Granulocytes: 1 %
Lymphocytes Relative: 3 %
Lymphs Abs: 0.8 10*3/uL (ref 0.7–4.0)
MCH: 31.6 pg (ref 26.0–34.0)
MCHC: 33.4 g/dL (ref 30.0–36.0)
MCV: 94.5 fL (ref 80.0–100.0)
Monocytes Absolute: 1.2 10*3/uL — ABNORMAL HIGH (ref 0.1–1.0)
Monocytes Relative: 4 %
Neutro Abs: 24.5 10*3/uL — ABNORMAL HIGH (ref 1.7–7.7)
Neutrophils Relative %: 92 %
Platelets: 260 10*3/uL (ref 150–400)
RBC: 3.83 MIL/uL — ABNORMAL LOW (ref 4.22–5.81)
RDW: 13.5 % (ref 11.5–15.5)
WBC: 26.7 10*3/uL — ABNORMAL HIGH (ref 4.0–10.5)
nRBC: 0 % (ref 0.0–0.2)

## 2020-11-17 LAB — COMPREHENSIVE METABOLIC PANEL
ALT: 15 U/L (ref 0–44)
AST: 20 U/L (ref 15–41)
Albumin: 3.9 g/dL (ref 3.5–5.0)
Alkaline Phosphatase: 63 U/L (ref 38–126)
Anion gap: 12 (ref 5–15)
BUN: 45 mg/dL — ABNORMAL HIGH (ref 8–23)
CO2: 27 mmol/L (ref 22–32)
Calcium: 8.9 mg/dL (ref 8.9–10.3)
Chloride: 94 mmol/L — ABNORMAL LOW (ref 98–111)
Creatinine, Ser: 2.03 mg/dL — ABNORMAL HIGH (ref 0.61–1.24)
GFR, Estimated: 30 mL/min — ABNORMAL LOW (ref >60)
Glucose, Bld: 292 mg/dL — ABNORMAL HIGH (ref 70–99)
Potassium: 4.4 mmol/L (ref 3.5–5.1)
Sodium: 133 mmol/L — ABNORMAL LOW (ref 135–145)
Total Bilirubin: 1.5 mg/dL — ABNORMAL HIGH (ref 0.3–1.2)
Total Protein: 7.8 g/dL (ref 6.5–8.1)

## 2020-11-17 LAB — GLUCOSE, CAPILLARY
Glucose-Capillary: 197 mg/dL — ABNORMAL HIGH (ref 70–99)
Glucose-Capillary: 169 mg/dL — ABNORMAL HIGH (ref 70–99)

## 2020-11-17 LAB — PROTIME-INR
INR: 1.1 (ref 0.8–1.2)
Prothrombin Time: 14.6 seconds (ref 11.4–15.2)

## 2020-11-17 LAB — MRSA NEXT GEN BY PCR, NASAL: MRSA by PCR Next Gen: NOT DETECTED

## 2020-11-17 LAB — URINALYSIS, ROUTINE W REFLEX MICROSCOPIC
Bilirubin Urine: NEGATIVE
Glucose, UA: NEGATIVE mg/dL
Hgb urine dipstick: NEGATIVE
Ketones, ur: NEGATIVE mg/dL
Leukocytes,Ua: NEGATIVE
Nitrite: NEGATIVE
Protein, ur: NEGATIVE mg/dL
Specific Gravity, Urine: 1.015 (ref 1.005–1.030)
pH: 5 (ref 5.0–8.0)

## 2020-11-17 LAB — RESP PANEL BY RT-PCR (FLU A&B, COVID) ARPGX2
Influenza A by PCR: NEGATIVE
Influenza B by PCR: NEGATIVE
SARS Coronavirus 2 by RT PCR: NEGATIVE

## 2020-11-17 LAB — PHOSPHORUS: Phosphorus: 4.2 mg/dL (ref 2.5–4.6)

## 2020-11-17 LAB — LACTIC ACID, PLASMA
Lactic Acid, Venous: 2.8 mmol/L (ref 0.5–1.9)
Lactic Acid, Venous: 3.3 mmol/L (ref 0.5–1.9)
Lactic Acid, Venous: 3.7 mmol/L (ref 0.5–1.9)

## 2020-11-17 LAB — APTT: aPTT: 28 seconds (ref 24–36)

## 2020-11-17 LAB — MAGNESIUM: Magnesium: 2 mg/dL (ref 1.7–2.4)

## 2020-11-17 MED ORDER — INSULIN ASPART 100 UNIT/ML IJ SOLN
0.0000 [IU] | Freq: Three times a day (TID) | INTRAMUSCULAR | Status: DC
  Administered 2020-11-17 – 2020-11-19 (×6): 2 [IU] via SUBCUTANEOUS
  Administered 2020-11-19 – 2020-11-20 (×2): 1 [IU] via SUBCUTANEOUS

## 2020-11-17 MED ORDER — METHOCARBAMOL 500 MG PO TABS
500.0000 mg | ORAL_TABLET | Freq: Three times a day (TID) | ORAL | Status: DC | PRN
  Administered 2020-11-19 (×2): 500 mg via ORAL
  Filled 2020-11-17 (×3): qty 1

## 2020-11-17 MED ORDER — CLOPIDOGREL BISULFATE 75 MG PO TABS
75.0000 mg | ORAL_TABLET | Freq: Every day | ORAL | Status: DC
  Administered 2020-11-17 – 2020-11-20 (×4): 75 mg via ORAL
  Filled 2020-11-17 (×5): qty 1

## 2020-11-17 MED ORDER — VANCOMYCIN HCL 1500 MG/300ML IV SOLN
1500.0000 mg | INTRAVENOUS | Status: DC
  Administered 2020-11-17: 1500 mg via INTRAVENOUS
  Filled 2020-11-17: qty 300

## 2020-11-17 MED ORDER — TAMSULOSIN HCL 0.4 MG PO CAPS
0.4000 mg | ORAL_CAPSULE | Freq: Every day | ORAL | Status: DC
  Administered 2020-11-17 – 2020-11-19 (×3): 0.4 mg via ORAL
  Filled 2020-11-17 (×4): qty 1

## 2020-11-17 MED ORDER — GABAPENTIN 300 MG PO CAPS
300.0000 mg | ORAL_CAPSULE | Freq: Every day | ORAL | Status: DC
  Administered 2020-11-17 – 2020-11-19 (×3): 300 mg via ORAL
  Filled 2020-11-17 (×3): qty 1

## 2020-11-17 MED ORDER — ATORVASTATIN CALCIUM 20 MG PO TABS
20.0000 mg | ORAL_TABLET | Freq: Every morning | ORAL | Status: DC
  Administered 2020-11-18 – 2020-11-20 (×3): 20 mg via ORAL
  Filled 2020-11-17 (×2): qty 1

## 2020-11-17 MED ORDER — SODIUM CHLORIDE 0.9 % IV SOLN
2.0000 g | Freq: Once | INTRAVENOUS | Status: AC
  Administered 2020-11-17: 2 g via INTRAVENOUS
  Filled 2020-11-17: qty 2

## 2020-11-17 MED ORDER — ACETAMINOPHEN 650 MG RE SUPP: 650.0000 mg | Freq: Four times a day (QID) | RECTAL | Status: DC | PRN

## 2020-11-17 MED ORDER — FLUTICASONE PROPIONATE 50 MCG/ACT NA SUSP
2.0000 | Freq: Every day | NASAL | Status: DC
  Administered 2020-11-17 – 2020-11-20 (×4): 2 via NASAL
  Filled 2020-11-17 (×2): qty 16

## 2020-11-17 MED ORDER — LACTATED RINGERS IV SOLN: INTRAVENOUS | Status: AC

## 2020-11-17 MED ORDER — VANCOMYCIN HCL IN DEXTROSE 1-5 GM/200ML-% IV SOLN: 1000.0000 mg | Freq: Once | INTRAVENOUS | Status: DC

## 2020-11-17 MED ORDER — VANCOMYCIN HCL 1500 MG/300ML IV SOLN: 1500.0000 mg | Freq: Once | INTRAVENOUS | Status: DC

## 2020-11-17 MED ORDER — HYDROCORTISONE NA SUCCINATE PF 100 MG IJ SOLR
50.0000 mg | Freq: Three times a day (TID) | INTRAMUSCULAR | Status: DC
Start: 2020-11-17 — End: 2020-11-19
  Administered 2020-11-17 – 2020-11-19 (×6): 50 mg via INTRAVENOUS
  Filled 2020-11-17 (×7): qty 2

## 2020-11-17 MED ORDER — ORAL CARE MOUTH RINSE
15.0000 mL | Freq: Two times a day (BID) | OROMUCOSAL | Status: DC
  Administered 2020-11-17 – 2020-11-20 (×5): 15 mL via OROMUCOSAL

## 2020-11-17 MED ORDER — LACTATED RINGERS IV BOLUS (SEPSIS)
1000.0000 mL | Freq: Once | INTRAVENOUS | Status: AC
  Administered 2020-11-17: 1000 mL via INTRAVENOUS

## 2020-11-17 MED ORDER — SODIUM CHLORIDE 0.9 % IV SOLN
2.0000 g | INTRAVENOUS | Status: DC
  Administered 2020-11-18: 2 g via INTRAVENOUS
  Filled 2020-11-17 (×2): qty 2

## 2020-11-17 MED ORDER — LORATADINE 10 MG PO TABS
10.0000 mg | ORAL_TABLET | Freq: Every day | ORAL | Status: DC
  Administered 2020-11-17 – 2020-11-20 (×4): 10 mg via ORAL
  Filled 2020-11-17 (×4): qty 1

## 2020-11-17 MED ORDER — ONDANSETRON HCL 4 MG PO TABS: 4.0000 mg | ORAL_TABLET | Freq: Four times a day (QID) | ORAL | Status: DC | PRN

## 2020-11-17 MED ORDER — ASPIRIN EC 81 MG PO TBEC
81.0000 mg | DELAYED_RELEASE_TABLET | Freq: Every day | ORAL | Status: DC
  Administered 2020-11-17 – 2020-11-20 (×4): 81 mg via ORAL
  Filled 2020-11-17 (×4): qty 1

## 2020-11-17 MED ORDER — CITALOPRAM HYDROBROMIDE 20 MG PO TABS
20.0000 mg | ORAL_TABLET | Freq: Every day | ORAL | Status: DC
  Administered 2020-11-17 – 2020-11-20 (×4): 20 mg via ORAL
  Filled 2020-11-17 (×4): qty 1

## 2020-11-17 MED ORDER — SODIUM CHLORIDE 0.9 % IV SOLN: 2.0000 g | Freq: Once | INTRAVENOUS | Status: DC

## 2020-11-17 MED ORDER — HEPARIN SODIUM (PORCINE) 5000 UNIT/ML IJ SOLN
5000.0000 [IU] | Freq: Three times a day (TID) | INTRAMUSCULAR | Status: DC
  Administered 2020-11-17 – 2020-11-20 (×8): 5000 [IU] via SUBCUTANEOUS
  Filled 2020-11-17 (×8): qty 1

## 2020-11-17 MED ORDER — ONDANSETRON HCL 4 MG/2ML IJ SOLN: 4.0000 mg | Freq: Four times a day (QID) | INTRAMUSCULAR | Status: DC | PRN

## 2020-11-17 MED ORDER — METRONIDAZOLE 500 MG/100ML IV SOLN
500.0000 mg | Freq: Once | INTRAVENOUS | Status: AC
  Administered 2020-11-17: 500 mg via INTRAVENOUS
  Filled 2020-11-17: qty 100

## 2020-11-17 MED ORDER — LACTATED RINGERS IV BOLUS
250.0000 mL | Freq: Once | INTRAVENOUS | Status: AC
  Administered 2020-11-17: 250 mL via INTRAVENOUS

## 2020-11-17 MED ORDER — INSULIN ASPART 100 UNIT/ML IJ SOLN
0.0000 [IU] | Freq: Every day | INTRAMUSCULAR | Status: DC
  Administered 2020-11-18: 2 [IU] via SUBCUTANEOUS

## 2020-11-17 MED ORDER — LACTATED RINGERS IV SOLN: INTRAVENOUS | Status: DC

## 2020-11-17 MED ORDER — RISPERIDONE 0.5 MG PO TABS
0.2500 mg | ORAL_TABLET | Freq: Every day | ORAL | Status: DC
  Administered 2020-11-17 – 2020-11-19 (×3): 0.25 mg via ORAL
  Filled 2020-11-17 (×5): qty 1

## 2020-11-17 MED ORDER — TRAZODONE HCL 50 MG PO TABS
50.0000 mg | ORAL_TABLET | Freq: Every day | ORAL | Status: DC
  Administered 2020-11-17 – 2020-11-19 (×3): 50 mg via ORAL
  Filled 2020-11-17 (×3): qty 1

## 2020-11-17 MED ORDER — CHLORHEXIDINE GLUCONATE CLOTH 2 % EX PADS
6.0000 | MEDICATED_PAD | Freq: Every day | CUTANEOUS | Status: DC
  Administered 2020-11-18 – 2020-11-20 (×4): 6 via TOPICAL

## 2020-11-17 MED ORDER — ACETAMINOPHEN 325 MG PO TABS
650.0000 mg | ORAL_TABLET | Freq: Four times a day (QID) | ORAL | Status: DC | PRN
  Administered 2020-11-18 – 2020-11-20 (×6): 650 mg via ORAL
  Filled 2020-11-17 (×6): qty 2

## 2020-11-17 MED ORDER — PRAMIPEXOLE DIHYDROCHLORIDE 0.125 MG PO TABS
0.1250 mg | ORAL_TABLET | Freq: Every day | ORAL | Status: DC
  Administered 2020-11-17 – 2020-11-19 (×3): 0.125 mg via ORAL
  Filled 2020-11-17 (×4): qty 1

## 2020-11-17 MED ORDER — HYDROXYZINE HCL 25 MG PO TABS
25.0000 mg | ORAL_TABLET | Freq: Four times a day (QID) | ORAL | Status: DC | PRN
  Administered 2020-11-18: 25 mg via ORAL
  Filled 2020-11-17: qty 1

## 2020-11-17 MED ORDER — IPRATROPIUM-ALBUTEROL 0.5-2.5 (3) MG/3ML IN SOLN
3.0000 mL | Freq: Four times a day (QID) | RESPIRATORY_TRACT | Status: DC
  Administered 2020-11-17 – 2020-11-18 (×3): 3 mL via RESPIRATORY_TRACT
  Filled 2020-11-17 (×3): qty 3

## 2020-11-17 NOTE — Progress Notes (Signed)
Pharmacy Antibiotic Note  [name] is a 87 y.o. male admitted on 11/17/2020 with  unknown source .  Pharmacy has been consulted for Vancomycin and cefepime dosing.  Plan: Vancomycin '1500mg'$  IV q48h for AUC of 476 Cefepime 2gm IV q24h F/U cxs and clinical progress Monitor V/S, labs and levels as indicated  Height: '5\' 11"'$  (180.3 cm) Weight: 85.7 kg (189 lb) IBW/kg (Calculated) : 75.3  Temp (24hrs), Avg:99.9 F (37.7 C), Min:99.9 F (37.7 C), Max:99.9 F (37.7 C)  Recent Labs  Lab 11/17/20 1009 11/17/20 1010  WBC 26.7*  --   CREATININE 2.03*  --   LATICACIDVEN  --  2.8*    Estimated Creatinine Clearance: 25.2 mL/min (A) (by C-G formula based on SCr of 2.03 mg/dL (H)).    Allergies  Allergen Reactions   Sulfonamide Derivatives Rash   Tetracycline Rash    Antimicrobials this admission: Vancomycin  7/25 >>  Cefepime 7/25 >> Flagyl 7/25   Microbiology results: 7/25 BCx: pending 7/25 UCx: pending  MRSA PCR:   Thank you for allowing pharmacy to be a part of this patient's care.  Isac Sarna, BS Pharm D, [place] Clinical Pharmacist Pager [phone] 11/17/2020 11:21 AM

## 2020-11-17 NOTE — ED Triage Notes (Signed)
Pt arrived by [facility] from [place]. Pt c/o fever and ams starting today. Pt oxygen enroute 89% on RA, does not use oxygen at home.

## 2020-11-17 NOTE — H&P (Signed)
History and Physical    KAISER BELLUOMINI KPT:[id number] DOB: February 05, 1927 DOA: 11/17/2020  PCP: Bonnita Nasuti, MD  Patient coming from: Nursing home  I have personally briefly reviewed patient's old medical records in [place]  Chief Complaint: Shortness of breath, intermittent productive coughing spells and general malaise.  HPI: [name] is a 87 y.o. male with medical history significant of hypertension, coronary artery disease, chronic kidney disease stage IIIb, type 2 diabetes, hyperlipidemia, depression/anxiety and BPH; who presented to the emergency department from his skilled nursing facility secondary to productive cough, general malaise and shortness of breath.  Symptoms have been present for the last 3 days or so and worsening according to patient.  At the time the EMS arrived to his facility patient had oxygen saturation of 88-89 on room air.  Patient denies chest pain, no nausea, no vomiting, no hemoptysis, no abdominal pain, no dysuria, no focal weakness, no sick contacts or any other complaints.  Patient Is now vaccinated against COVID; COVID PCR in the emergency department was negative.  ED Course: Work-up demonstrating just a right with new left lower lobe infiltrates, elevated lactic acid, significant leukocytosis, hypoxia, soft blood pressure and low-grade temperature (99.9); he met sepsis criteria on admission.  Fluid resuscitation provided, cultures taken, broad-spectrum antibiotics initiated.  Oxygen supplementation provided.  TRH has been called to place in the hospital for further evaluation and management.  Review of Systems: As per HPI otherwise all other systems reviewed and are negative.   Past Medical History:  Diagnosis Date   Aortic insufficiency    a. previously mild in 2012. b. trivial seen on echo in 05/2018.   Asbestos exposure    Bradycardia    MILD   CAD (coronary artery disease) 2003   a. s/p CABG in 1994. b. Cath 2003 patent grafts. c.  DOE in 2020 felt anginal equivalent - cath 01/2019 s/p PCI/DES to the SVG-PDA.   Cancer [facility])    Skin   Carotid artery disease ([place]) 2001   BILATERAL 0-39%/  Follow with Dopplers by Dr. Woody Seller   Chronic combined systolic and diastolic CHF (congestive heart failure) ([place])    CKD (chronic kidney disease), stage III ([place])    Colitis    Diabetes mellitus    Dilated aortic root ([place]) 05/2018   Dizziness    He has had falls related to this / Chronic dizziness   Dyslipidemia    Hx of CABG    Hypertension    Nephrolithiasis    PVD (peripheral vascular disease) ([place])    Renal cyst    TIA (transient ischemic attack) 2007   ?? medical Rx     Past Surgical History:  Procedure Laterality Date   CORONARY ARTERY BYPASS GRAFT     CORONARY STENT INTERVENTION N/A 02/15/2019   Procedure: CORONARY STENT INTERVENTION;  Surgeon: Jettie Booze, MD;  Location: [place] [facility];  Service: Cardiovascular;  Laterality: N/A;  SEQ SVG PDA-PL   LEFT HEART CATH AND CORS/GRAFTS ANGIOGRAPHY N/A 02/15/2019   Procedure: LEFT HEART CATH AND CORS/GRAFTS ANGIOGRAPHY;  Surgeon: Jettie Booze, MD;  Location: [place] [facility];  Service: Cardiovascular;  Laterality: N/A;   percutaneous transluminal coronary angioplasty     hx    Social History  reports that he quit smoking about 40 years ago. His smoking use included cigarettes. He started smoking about 73 years ago. He has a 60.00 pack-year smoking history. He has never used smokeless tobacco. He reports  that he does not drink alcohol and does not use drugs.  Allergies  Allergen Reactions   Sulfonamide Derivatives Rash   Tetracycline Rash    Family History  Problem Relation Age of Onset   Heart attack Mother 21   Prior to Admission medications   Medication Sig Start Date End Date Taking? Authorizing Provider  acetaminophen (TYLENOL) 500 MG tablet Take 1,000 mg by mouth every 8 (eight) hours as needed.   Yes [provider]  albuterol  (VENTOLIN HFA) 108 (90 Base) MCG/ACT inhaler Inhale 2 puffs into the lungs every 4 (four) hours as needed for wheezing or shortness of breath.   Yes [provider]  aspirin EC 81 MG tablet Take 1 tablet (81 mg total) by mouth daily. 05/22/12  Yes Carlena Bjornstad, MD  atorvastatin (LIPITOR) 20 MG tablet Take 20 mg by mouth every morning.  10/26/17  Yes [provider]  citalopram (CELEXA) 20 MG tablet Take 20 mg by mouth daily.   Yes [provider]  clopidogrel (PLAVIX) 75 MG tablet Take 1 tablet (75 mg total) by mouth daily. 02/21/19  Yes Strader, [place] M, PA-C  fluticasone (FLONASE) 50 MCG/ACT nasal spray Place 2 sprays into both nostrils daily.   Yes [provider]  furosemide (LASIX) 80 MG tablet Take 80 mg by mouth 2 (two) times daily.   Yes [provider]  gabapentin (NEURONTIN) 100 MG capsule Take 300 mg by mouth at bedtime.   Yes [provider]  hydrOXYzine (ATARAX/VISTARIL) 25 MG tablet Take 25 mg by mouth every 6 (six) hours as needed for itching.   Yes [provider]  loratadine (CLARITIN) 10 MG tablet Take 10 mg by mouth daily.   Yes [provider]  magnesium hydroxide (MILK OF MAGNESIA) 400 MG/5ML suspension Take 30 mLs by mouth daily as needed for mild constipation.   Yes [provider]  metFORMIN (GLUCOPHAGE) 500 MG tablet Take 500 mg by mouth at bedtime.    Yes [provider]  metoprolol succinate (TOPROL-XL) 25 MG 24 hr tablet Take 25 mg by mouth daily.   Yes [provider]  Multiple Vitamin (MULTIVITAMIN) tablet Take 1 tablet by mouth daily.   Yes [provider]  polyethylene glycol (MIRALAX / GLYCOLAX) 17 g packet Take 17 g by mouth daily.   Yes [provider]  potassium chloride SA (KLOR-CON) 20 MEQ tablet Take 20 mEq by mouth daily.   Yes [provider]  pramipexole (MIRAPEX) 0.125 MG tablet Take 0.125 mg by mouth at bedtime.   Yes [provider]  risperiDONE (RISPERDAL) 0.25 MG tablet Take 1 tablet (0.25 mg total) by mouth at bedtime. 09/28/19  Yes Danford, Suann Larry, MD  sennosides-docusate sodium (SENOKOT-S) 8.6-50 MG tablet Take 2 tablets by mouth daily as needed for constipation.   Yes [provider]  simethicone (MYLICON) 80 MG chewable tablet Chew 80 mg by mouth every 6 (six) hours as needed for flatulence.   Yes [provider]  tamsulosin (FLOMAX) 0.4 MG CAPS capsule Take 0.4 mg by mouth daily after supper.   Yes [provider]  traZODone (DESYREL) 50 MG tablet Take 50 mg by mouth at bedtime.   Yes [provider]  nitroGLYCERIN (NITROSTAT) 0.4 MG SL tablet Place 0.4 mg under the tongue every 5 (five) minutes x 3 doses as needed for chest pain (if no relief after 2nd dose, proceed to the ED for an evaluation or call 911).  [provider]    Physical Exam: Vitals:   11/17/20 1200 11/17/20 1230 11/17/20 1400 11/17/20 1422  BP: (!) 101/53 (!) 98/55 (!) 94/46   Pulse: 79 83 (!) 160   Resp: (!) 24 (!) 28 (!) 21 14  Temp:    98.8 F (37.1 C)  TempSrc:    Oral  SpO2: 94% 93% (!) 84%   Weight:    85.1 kg  Height:    '5\' 11"'  (1.803 m)    Constitutional: Low-grade temperature, no chest pain, no nausea or vomiting.  Hypoxia on room air appreciated. Vitals:   11/17/20 1200 11/17/20 1230 11/17/20 1400 11/17/20 1422  BP: (!) 101/53 (!) 98/55 (!) 94/46   Pulse: 79 83 (!) 160   Resp: (!) 24 (!) 28 (!) 21 14  Temp:    98.8 F (37.1 C)  TempSrc:    Oral  SpO2: 94% 93% (!) 84%   Weight:    85.1 kg  Height:    '5\' 11"'  (1.803 m)   Eyes: PERRL, lids and conjunctivae normal; no icterus, no nystagmus. ENMT: Mucous membranes are moist. Posterior pharynx clear of any exudate or lesions. Neck: normal, supple, no masses, no thyromegaly; no JVD Respiratory: Positive rhonchi bilaterally, mild expiratory wheezing appreciated.  No using accessory muscle.  Positive tachypnea.  2  L nasal cannula in place with good saturation. Cardiovascular: Rate controlled, no rubs, no gallops, no JVD on exam. Abdomen: no tenderness, no masses palpated. No hepatosplenomegaly. Bowel sounds positive.  Musculoskeletal: no clubbing / cyanosis. No joint deformity upper and lower extremities. Good ROM, no contractures. Normal muscle tone.  Skin: no petechiae. Neurologic: CN 2-12 grossly intact.  Muscle strength 4 out of 5 bilaterally symmetrically in the setting of poor effort.  No focal deficits appreciated. Psychiatric: Normal judgment and insight. Alert and oriented x 3. Normal mood.   Labs on Admission: I have personally reviewed following labs and imaging studies  CBC: Recent Labs  Lab 11/17/20 1009  WBC 26.7*  NEUTROABS 24.5*  HGB 12.1*  HCT 36.2*  MCV 94.5  PLT 185    Basic Metabolic Panel: Recent Labs  Lab 11/17/20 1009  NA 133*  K 4.4  CL 94*  CO2 27  GLUCOSE 292*  BUN 45*  CREATININE 2.03*  CALCIUM 8.9    GFR: Estimated Creatinine Clearance: 25.2 mL/min (A) (by C-G formula based on SCr of 2.03 mg/dL (H)).  Liver Function Tests: Recent Labs  Lab 11/17/20 1009  AST 20  ALT 15  ALKPHOS 63  BILITOT 1.5*  PROT 7.8  ALBUMIN 3.9    Urine analysis:    Component Value Date/Time   COLORURINE STRAW (A) 09/26/2019 1836   APPEARANCEUR CLEAR 09/26/2019 1836   LABSPEC 1.005 09/26/2019 1836   PHURINE 5.0 09/26/2019 1836   GLUCOSEU NEGATIVE 09/26/2019 1836   HGBUR NEGATIVE 09/26/2019 1836   BILIRUBINUR NEGATIVE 09/26/2019 1836   KETONESUR NEGATIVE 09/26/2019 1836   PROTEINUR NEGATIVE 09/26/2019 1836   NITRITE NEGATIVE 09/26/2019 1836   LEUKOCYTESUR NEGATIVE 09/26/2019 1836    Radiological Exams on Admission: DG Chest Port 1 View  Result Date: 11/17/2020 CLINICAL DATA:  Sepsis with weakness EXAM: PORTABLE CHEST 1 VIEW COMPARISON:  09/29/2019 FINDINGS: Left humeral hardware again seen. 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 Unchanged mild cardiomegaly  and mild pulmonary vascular congestion. Unchanged small right pleural effusion with adjacent atelectasis. There is new patchy opacities at the left lung base. IMPRESSION: New patchy opacity at the left lung base may  be due to atelectasis or pneumonia. Unchanged small right pleural effusion. Electronically Signed   By: Miachel Roux M.D.   On: 11/17/2020 11:12    EKG: Independently reviewed. No acute ischemic changes  Assessment/Plan 1-severe sepsis -Patient Met criteria for sepsis in the setting of tachypnea, elevated temperature, leukocytosis, with affected organ dysfunction in the setting of hypoxia and elevated lactic acid. -Following sepsis protocol fluid resuscitation has been provided -Patient has been started on broad-spectrum antibiotics including to cover for HCAP which is the source of infection. -Solu-Cortef initiated, flutter valve, as needed bronchodilators and oxygen supplementation provided -Will follow clinical response.  2-coronary disease/CHF -Currently dry on presentation --Diuretics -Patient denies chest pain -Follow daily weights and strict I's and O's -Heart healthy diet has been requested. -Continue treatment with aspirin and Plavix.  3-hypertension -Holding antihypertensive agents in the setting of soft blood pressure and acute sepsis -Follow vital signs.  4-hyperlipidemia -Continue statin.  5-restless leg syndrome -Continue Mirapex  6-depression/anxiety  -Continue treatment with Celexa trazodone and Risperdal -Mood is stable.  7-chronic kidney disease a stage IIIb  -appears to be stable and essentially at baseline -Follow renal function trend and maintain adequate hydration -Minimize nephrotoxic agents.  8-type 2 diabetes with nephropathy -Modify carbohydrate diet has been ordered -Continue sliding scale insulin and follow CBGs. -Holding oral hypoglycemic agents while inpatient.   DVT prophylaxis: Heparin Code Status:   DNR Family Communication:   No family at bedside. Disposition Plan:   Patient is from:  Skilled nursing facility  Anticipated DC to:  Skilled nursing facility  Anticipated DC date:  To be determined  Anticipated DC barriers: Resolution of sepsis features and a stabilization of his breathing status.  Consults called:  None Admission status:  Stepdown, inpatient, length of stay more than 2 midnights.  Severity of Illness: The appropriate patient status for this patient is INPATIENT. Inpatient status is judged to be reasonable and necessary in order to provide the required intensity of service to ensure the patient's safety. The patient's presenting symptoms, physical exam findings, and initial radiographic and laboratory data in the context of their chronic comorbidities is felt to place them at high risk for further clinical deterioration. Furthermore, it is not anticipated that the patient will be medically stable for discharge from the hospital within 2 midnights of admission. The following factors support the patient status of inpatient.   " The patient's presenting symptoms include shortness of breath, productive cough and hypoxia; patient met sepsis criteria on presentation. " The worrisome physical exam findings include high temperature, tachypnea, leukocytosis, elevated lactic acid, signs of dehydration and hypoxia. " The initial radiographic and laboratory data are worrisome because of left lower lobe infiltrates along with abnormalities in his blood work that facilitate criteria for severe sepsis.. " The chronic co-morbidities include hypertension, hyperlipidemia, coronary artery disease and CHF.   * I certify that at the point of admission it is my clinical judgment that the patient will require inpatient hospital care spanning beyond 2 midnights from the point of admission due to high intensity of service, high risk for further deterioration and high frequency of surveillance required.Barton Dubois  MD [facility]  How to contact the [facility] Attending or Consulting provider [place] or covering provider during after hours [place], for this patient?   Check the care team in [facility] and look for a) attending/consulting [facility] provider listed and b) the [facility] team listed Log into [email or web address] and use [place]'s universal  password to access. If you do not have the password, please contact the hospital operator. Locate the [facility] provider you are looking for under [facility] and page to a number that you can be directly reached. If you still have difficulty reaching the provider, please page the [facility] (Director on Call) for the Hospitalists listed on amion for assistance.  11/17/2020, 2:34 PM

## 2020-11-17 NOTE — Progress Notes (Signed)
MD notified of decreasing HR Afib 45-55.

## 2020-11-17 NOTE — Progress Notes (Signed)
Notified Dr Dyann Kief of Lactic lab result being 3.7. Dr Dyann Kief expressed understanding. 232m bolus given per orders. Confirmed with Dr MDyann Kiefabout another lactic being drawn and Dr MDyann Kiefstated he would put order in for lab draw.  New order placed by Dr MDyann Kieffor lactic to be drawn in the morning. Patient currently appears to be resting comfortably.

## 2020-11-17 NOTE — Progress Notes (Signed)
Notified bedside nurse of need to draw repeat lactic acid. 

## 2020-11-17 NOTE — Progress Notes (Signed)
Notified provider and bedside nurse of need to draw repeat lactic acid @ 1530 (#3)

## 2020-11-17 NOTE — Progress Notes (Signed)
Gave patient a flutter.  Explained how to use and how it helps.  Patient was able to do X10 but attempt was weak.  Flutter is at bedside and will attempt to have patient redo 10 tries with next treatment.

## 2020-11-17 NOTE — ED Provider Notes (Signed)
[facility] EMERGENCY DEPARTMENT Provider Note   CSN: [id number] Arrival date & time: 11/17/20  2355     History Chief Complaint  Patient presents with   Fever    [name] is a 87 y.o. male.  Patient brought in from [facility] facility in May at than by EMS.  Patient complaint of fever and some altered mental status changes starting today.  Patient oxygen in route was 89% on room air does not normally use oxygen.  Patient's temp upon arrival was 99.9.  Respiratory rate was up to 24.  Patient was coughing.  Blood pressure 100/61.  On the 2 L his oxygen sats were 93%.  Patient has significant past medical history for congestive heart failure chronic kidney disease diabetes dilated aortic root.  Coronary artery disease status post CABG and 94.  And has most recent cardiac cath was in October 2020 patient had stents placed.  There is some COVID at the nursing facility.      Past Medical History:  Diagnosis Date   Aortic insufficiency    a. previously mild in 2012. b. trivial seen on echo in 05/2018.   Asbestos exposure    Bradycardia    MILD   CAD (coronary artery disease) 2003   a. s/p CABG in 1994. b. Cath 2003 patent grafts. c. DOE in 2020 felt anginal equivalent - cath 01/2019 s/p PCI/DES to the SVG-PDA.   Cancer [facility]    Skin   Carotid artery disease ([place]) 2001   BILATERAL 0-39%/  Follow with Dopplers by Dr. Woody Seller   Chronic combined systolic and diastolic CHF (congestive heart failure) ([place])    CKD (chronic kidney disease), stage III ([place])    Colitis    Diabetes mellitus    Dilated aortic root ([place]) 05/2018   Dizziness    He has had falls related to this / Chronic dizziness   Dyslipidemia    Hx of CABG    Hypertension    Nephrolithiasis    PVD (peripheral vascular disease) ([place])    Renal cyst    TIA (transient ischemic attack) 2007   ?? medical Rx     Patient Active Problem List   Diagnosis Date Noted   Sepsis ([place]) 11/17/2020   Diarrhea     Falls frequently 09/26/2019   CKD (chronic kidney disease), stage III ([place]) 02/16/2019   DOE (dyspnea on exertion) 02/15/2019   Mitral regurgitation    Aortic insufficiency    Right knee dislocation 05/26/2011   Knee dislocation 04/03/2011   Carotid artery disease (HCC)    Ejection fraction    SOB (shortness of breath)    Dizziness    Hx of CABG    Dyslipidemia    Hypertension    CAD (coronary artery disease) of artery bypass graft    TIA (transient ischemic attack)    Bradycardia    Diabetes mellitus type 2 in nonobese ([place]) 12/20/2008   COLITIS, HX OF 12/20/2008   NEPHROLITHIASIS, HX OF 12/20/2008   ASBESTOS EXPOSURE, HX OF 12/20/2008    Past Surgical History:  Procedure Laterality Date   CORONARY ARTERY BYPASS GRAFT     CORONARY STENT INTERVENTION N/A 02/15/2019   Procedure: CORONARY STENT INTERVENTION;  Surgeon: Jettie Booze, MD;  Location: [place] [facility];  Service: Cardiovascular;  Laterality: N/A;  SEQ SVG PDA-PL   LEFT HEART CATH AND CORS/GRAFTS ANGIOGRAPHY N/A 02/15/2019   Procedure: LEFT HEART CATH AND CORS/GRAFTS ANGIOGRAPHY;  Surgeon: Jettie Booze, MD;  Location: [place] [facility];  Service: Cardiovascular;  Laterality: N/A;   percutaneous transluminal coronary angioplasty     hx       Family History  Problem Relation Age of Onset   Heart attack Mother 40    Social History   Tobacco Use   Smoking status: Former    Packs/day: 2.00    Years: 30.00    Pack years: 60.00    Types: Cigarettes    Start date: 12/23/1946    Quit date: 04/26/1980    Years since quitting: 40.5   Smokeless tobacco: Never  Vaping Use   Vaping Use: Never used  Substance Use Topics   Alcohol use: Never    Alcohol/week: 0.0 standard drinks   Drug use: Never    Home Medications Prior to Admission medications   Medication Sig Start Date End Date Taking? Authorizing Provider  acetaminophen (TYLENOL) 500 MG tablet Take 1,000 mg by mouth every 8 (eight) hours as  needed.   Yes [provider]  albuterol (VENTOLIN HFA) 108 (90 Base) MCG/ACT inhaler Inhale 2 puffs into the lungs every 4 (four) hours as needed for wheezing or shortness of breath.   Yes [provider]  aspirin EC 81 MG tablet Take 1 tablet (81 mg total) by mouth daily. 05/22/12  Yes Carlena Bjornstad, MD  atorvastatin (LIPITOR) 20 MG tablet Take 20 mg by mouth every morning.  10/26/17  Yes [provider]  citalopram (CELEXA) 20 MG tablet Take 20 mg by mouth daily.   Yes [provider]  clopidogrel (PLAVIX) 75 MG tablet Take 1 tablet (75 mg total) by mouth daily. 02/21/19  Yes Strader, [place] M, PA-C  fluticasone (FLONASE) 50 MCG/ACT nasal spray Place 2 sprays into both nostrils daily.   Yes [provider]  furosemide (LASIX) 80 MG tablet Take 80 mg by mouth 2 (two) times daily.   Yes [provider]  gabapentin (NEURONTIN) 100 MG capsule Take 300 mg by mouth at bedtime.   Yes [provider]  hydrOXYzine (ATARAX/VISTARIL) 25 MG tablet Take 25 mg by mouth every 6 (six) hours as needed for itching.   Yes [provider]  loratadine (CLARITIN) 10 MG tablet Take 10 mg by mouth daily.   Yes [provider]  magnesium hydroxide (MILK OF MAGNESIA) 400 MG/5ML suspension Take 30 mLs by mouth daily as needed for mild constipation.   Yes [provider]  metFORMIN (GLUCOPHAGE) 500 MG tablet Take 500 mg by mouth at bedtime.    Yes [provider]  metoprolol succinate (TOPROL-XL) 25 MG 24 hr tablet Take 25 mg by mouth daily.   Yes [provider]  Multiple Vitamin (MULTIVITAMIN) tablet Take 1 tablet by mouth daily.   Yes [provider]  polyethylene glycol (MIRALAX / GLYCOLAX) 17 g packet Take 17 g by mouth daily.   Yes [provider]  potassium chloride SA (KLOR-CON) 20 MEQ tablet Take 20 mEq by mouth daily.   Yes [provider]  pramipexole (MIRAPEX) 0.125 MG tablet  Take 0.125 mg by mouth at bedtime.   Yes [provider]  risperiDONE (RISPERDAL) 0.25 MG tablet Take 1 tablet (0.25 mg total) by mouth at bedtime. 09/28/19  Yes Danford, Suann Larry, MD  sennosides-docusate sodium (SENOKOT-S) 8.6-50 MG tablet Take 2 tablets by mouth daily as needed for constipation.   Yes [provider]  simethicone (MYLICON) 80 MG chewable tablet Chew 80 mg by mouth every 6 (six) hours  as needed for flatulence.   Yes [provider]  tamsulosin (FLOMAX) 0.4 MG CAPS capsule Take 0.4 mg by mouth daily after supper.   Yes [provider]  traZODone (DESYREL) 50 MG tablet Take 50 mg by mouth at bedtime.   Yes [provider]  nitroGLYCERIN (NITROSTAT) 0.4 MG SL tablet Place 0.4 mg under the tongue every 5 (five) minutes x 3 doses as needed for chest pain (if no relief after 2nd dose, proceed to the ED for an evaluation or call 911).    [provider]    Allergies    Sulfonamide derivatives and Tetracycline  Review of Systems   Review of Systems  Constitutional:  Positive for fever. Negative for chills.  HENT:  Positive for congestion. Negative for ear pain and sore throat.   Eyes:  Negative for pain and visual disturbance.  Respiratory:  Positive for cough and shortness of breath.   Cardiovascular:  Negative for chest pain and palpitations.  Gastrointestinal:  Negative for abdominal pain and vomiting.  Genitourinary:  Negative for dysuria and hematuria.  Musculoskeletal:  Negative for arthralgias and back pain.  Skin:  Negative for color change and rash.  Neurological:  Negative for seizures and syncope.  All other systems reviewed and are negative.  Physical Exam Updated Vital Signs BP (!) 101/53   Pulse 79   Temp 99.9 F (37.7 C) (Oral)   Resp (!) 24   Ht 1.803 m ('5\' 11"' )   Wt 85.7 kg   SpO2 94%   BMI 26.36 kg/m   Physical Exam Vitals and nursing note reviewed.  Constitutional:      Appearance: He is  well-developed. He is ill-appearing.  HENT:     Head: Normocephalic and atraumatic.     Mouth/Throat:     Mouth: Mucous membranes are dry.  Eyes:     Extraocular Movements: Extraocular movements intact.     Conjunctiva/sclera: Conjunctivae normal.     Pupils: Pupils are equal, round, and reactive to light.  Cardiovascular:     Rate and Rhythm: Normal rate and regular rhythm.     Heart sounds: No murmur heard. Pulmonary:     Effort: Pulmonary effort is normal. No respiratory distress.     Breath sounds: No stridor. Rales present. No wheezing.  Chest:     Chest wall: No tenderness.  Abdominal:     Palpations: Abdomen is soft.     Tenderness: There is no abdominal tenderness.  Musculoskeletal:        General: Swelling present.     Cervical back: Neck supple.  Skin:    General: Skin is warm and dry.  Neurological:     General: No focal deficit present.     Mental Status: He is alert.     Comments: Awake and will follow commands.  Will converse.    ED Results / Procedures / Treatments   Labs (all labs ordered are listed, but only abnormal results are displayed) Labs Reviewed  COMPREHENSIVE METABOLIC PANEL - Abnormal; Notable for the following components:      Result Value   Sodium 133 (*)    Chloride 94 (*)    Glucose, Bld 292 (*)    BUN 45 (*)    Creatinine, Ser 2.03 (*)    Total Bilirubin 1.5 (*)    GFR, Estimated 30 (*)    All other components within normal limits  LACTIC ACID, PLASMA - Abnormal; Notable for the following components:   Lactic Acid,  Venous 2.8 (*)    All other components within normal limits  CBC WITH DIFFERENTIAL/PLATELET - Abnormal; Notable for the following components:   WBC 26.7 (*)    RBC 3.83 (*)    Hemoglobin 12.1 (*)    HCT 36.2 (*)    Neutro Abs 24.5 (*)    Monocytes Absolute 1.2 (*)    Abs Immature Granulocytes 0.22 (*)    All other components within normal limits  CULTURE, BLOOD (ROUTINE X 2)  CULTURE, BLOOD (ROUTINE X 2)  RESP PANEL  BY RT-PCR (FLU A&B, COVID) [id number]  URINE CULTURE  PROTIME-INR  APTT  LACTIC ACID, PLASMA  URINALYSIS, ROUTINE W REFLEX MICROSCOPIC    EKG EKG Interpretation  Date/Time:  Monday November 17 2020 09:36:03 EDT Ventricular Rate:  96 PR Interval:    QRS Duration: 117 QT Interval:  361 QTC Calculation: 457 R Axis:   -15 Text Interpretation: Sinus rhythm Incomplete left bundle branch block Minimal ST elevation, inferior leads ST changes inferiorly new Confirmed by Fredia Sorrow [id number]) on 11/17/2020 9:52:50 AM  Radiology DG Chest Port 1 View  Result Date: 11/17/2020 CLINICAL DATA:  Sepsis with weakness EXAM: PORTABLE CHEST 1 VIEW COMPARISON:  09/29/2019 FINDINGS: Left humeral hardware again seen. 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 Unchanged mild cardiomegaly and mild pulmonary vascular congestion. Unchanged small right pleural effusion with adjacent atelectasis. There is new patchy opacities at the left lung base. IMPRESSION: New patchy opacity at the left lung base may be due to atelectasis or pneumonia. Unchanged small right pleural effusion. Electronically Signed   By: Miachel Roux M.D.   On: 11/17/2020 11:12    Procedures Procedures   Medications Ordered in ED Medications  lactated ringers infusion (has no administration in time range)  lactated ringers bolus 1,000 mL (1,000 mLs Intravenous New Bag/Given 11/17/20 1144)    And  lactated ringers bolus 1,000 mL (has no administration in time range)    And  lactated ringers bolus 1,000 mL (has no administration in time range)  vancomycin (VANCOREADY) IVPB 1500 mg/300 mL (1,500 mg Intravenous New Bag/Given 11/17/20 1225)  ceFEPIme (MAXIPIME) 2 g in sodium chloride 0.9 % 100 mL IVPB (has no administration in time range)  heparin injection 5,000 Units (has no administration in time range)  ceFEPIme (MAXIPIME) 2 g in sodium chloride 0.9 % 100 mL IVPB (0 g Intravenous Stopped 11/17/20 1220)  metroNIDAZOLE (FLAGYL) IVPB 500 mg (500 mg  Intravenous New Bag/Given 11/17/20 1151)    ED Course  I have reviewed the triage vital signs and the nursing notes.  Pertinent labs & imaging results that were available during my care of the patient were reviewed by me and considered in my medical decision making (see chart for details).  CRITICAL CARE Performed by: Fredia Sorrow Total critical care time: 45 minutes Critical care time was exclusive of separately billable procedures and treating other patients. Critical care was necessary to treat or prevent imminent or life-threatening deterioration. Critical care was time spent personally by me on the following activities: development of treatment plan with patient and/or surrogate as well as nursing, discussions with consultants, evaluation of patient's response to treatment, examination of patient, obtaining history from patient or surrogate, ordering and performing treatments and interventions, ordering and review of laboratory studies, ordering and review of radiographic studies, pulse oximetry and re-evaluation of patient's condition.     MDM Rules/Calculators/A&P  Chest x-ray seems to suggest left lower lobe pneumonia.  This would fit the patient's symptoms of cough and shortness of breath.  And seems to have some rales.  History of fever as well.  COVID testing negative.  Patient's blood pressures have been in the upper 90s to low 268T systolic.  Patient receiving broad-spectrum antibiotics this was ordered based on sepsis protocol he met sepsis protocol based on the history of the fever respiratory rate being up and his lactic acid was elevated and his white blood cell count was 26,000.  Patient's kidney function also worse than baseline probably prerenal.  Patient appeared clinically dry.  Discussed with hospitalist who will admit.  Patient did receive the full 30 cc/kg fluid challenge.  He is tolerating that so far.  But could run into  difficulty. Final Clinical Impression(s) / ED Diagnoses Final diagnoses:  Sepsis, due to unspecified organism, unspecified whether acute organ dysfunction present ([place])  HCAP (healthcare-associated pneumonia)    Rx / DC Orders ED Discharge Orders     None        Fredia Sorrow, MD 11/17/20 1328

## 2020-11-17 NOTE — Progress Notes (Signed)
Elink following for code sepsis 

## 2020-11-18 ENCOUNTER — Encounter (HOSPITAL_COMMUNITY): Payer: Self-pay | Admitting: Internal Medicine

## 2020-11-18 DIAGNOSIS — A419 Sepsis, unspecified organism: Secondary | ICD-10-CM | POA: Diagnosis not present

## 2020-11-18 DIAGNOSIS — J189 Pneumonia, unspecified organism: Secondary | ICD-10-CM | POA: Diagnosis not present

## 2020-11-18 DIAGNOSIS — Z7189 Other specified counseling: Secondary | ICD-10-CM | POA: Diagnosis not present

## 2020-11-18 DIAGNOSIS — Z515 Encounter for palliative care: Secondary | ICD-10-CM

## 2020-11-18 DIAGNOSIS — J9601 Acute respiratory failure with hypoxia: Secondary | ICD-10-CM

## 2020-11-18 DIAGNOSIS — R6520 Severe sepsis without septic shock: Secondary | ICD-10-CM | POA: Diagnosis not present

## 2020-11-18 LAB — CBC
HCT: 29.5 % — ABNORMAL LOW (ref 39.0–52.0)
Hemoglobin: 9.4 g/dL — ABNORMAL LOW (ref 13.0–17.0)
MCH: 31 pg (ref 26.0–34.0)
MCHC: 31.9 g/dL (ref 30.0–36.0)
MCV: 97.4 fL (ref 80.0–100.0)
Platelets: 214 10*3/uL (ref 150–400)
RBC: 3.03 MIL/uL — ABNORMAL LOW (ref 4.22–5.81)
RDW: 13.5 % (ref 11.5–15.5)
WBC: 18.3 10*3/uL — ABNORMAL HIGH (ref 4.0–10.5)
nRBC: 0 % (ref 0.0–0.2)

## 2020-11-18 LAB — BASIC METABOLIC PANEL
Anion gap: 8 (ref 5–15)
BUN: 34 mg/dL — ABNORMAL HIGH (ref 8–23)
CO2: 29 mmol/L (ref 22–32)
Calcium: 8.5 mg/dL — ABNORMAL LOW (ref 8.9–10.3)
Chloride: 101 mmol/L (ref 98–111)
Creatinine, Ser: 1.34 mg/dL — ABNORMAL HIGH (ref 0.61–1.24)
GFR, Estimated: 50 mL/min — ABNORMAL LOW (ref >60)
Glucose, Bld: 201 mg/dL — ABNORMAL HIGH (ref 70–99)
Potassium: 4.1 mmol/L (ref 3.5–5.1)
Sodium: 138 mmol/L (ref 135–145)

## 2020-11-18 LAB — PROTIME-INR
INR: 1.2 (ref 0.8–1.2)
Prothrombin Time: 14.8 seconds (ref 11.4–15.2)

## 2020-11-18 LAB — GLUCOSE, CAPILLARY
Glucose-Capillary: 159 mg/dL — ABNORMAL HIGH (ref 70–99)
Glucose-Capillary: 189 mg/dL — ABNORMAL HIGH (ref 70–99)
Glucose-Capillary: 155 mg/dL — ABNORMAL HIGH (ref 70–99)
Glucose-Capillary: 217 mg/dL — ABNORMAL HIGH (ref 70–99)

## 2020-11-18 LAB — PROCALCITONIN: Procalcitonin: 0.99 ng/mL

## 2020-11-18 LAB — CORTISOL-AM, BLOOD: Cortisol - AM: 22 ug/dL (ref 6.7–22.6)

## 2020-11-18 LAB — HEMOGLOBIN A1C
Hgb A1c MFr Bld: 9.5 % — ABNORMAL HIGH (ref 4.8–5.6)
Mean Plasma Glucose: 226 mg/dL

## 2020-11-18 LAB — LACTIC ACID, PLASMA: Lactic Acid, Venous: 1.8 mmol/L (ref 0.5–1.9)

## 2020-11-18 LAB — CORTISOL: Cortisol, Plasma: 30.8 ug/dL

## 2020-11-18 MED ORDER — SODIUM CHLORIDE 0.9 % IV SOLN: INTRAVENOUS | Status: AC

## 2020-11-18 MED ORDER — ATROPINE SULFATE 1 MG/10ML IJ SOSY
0.5000 mg | PREFILLED_SYRINGE | Freq: Once | INTRAMUSCULAR | Status: AC
  Administered 2020-11-18: 0.5 mg via INTRAVENOUS
  Filled 2020-11-18: qty 10

## 2020-11-18 MED ORDER — IPRATROPIUM-ALBUTEROL 0.5-2.5 (3) MG/3ML IN SOLN
3.0000 mL | Freq: Three times a day (TID) | RESPIRATORY_TRACT | Status: DC
  Administered 2020-11-18 – 2020-11-20 (×6): 3 mL via RESPIRATORY_TRACT
  Filled 2020-11-18 (×6): qty 3

## 2020-11-18 NOTE — Progress Notes (Signed)
MD notified of hypotension, though he has been hypotensive since admit. BP 92/40 and MAP 57.

## 2020-11-18 NOTE — TOC Initial Note (Signed)
Transition of Care [facility]) - Initial/Assessment Note    Patient Details  Name: [name] MRN: [id number] Date of Birth: March 11, 1927  [facility] CM/SW Contact:    Boneta Lucks, RN Phone Number: 11/18/2020, 4:00 PM  Clinical Narrative:  Patient admitted with COVID, has a high risk for readmission . Patient is from [facility]. Palliative is consulting, family wants patient to go back to [facility] with [facility] hosp[facility]. TOC spoke with [facility], they are agreeable.              Expected Discharge Plan: Assisted Living Barriers to Discharge: Continued Medical Work up  Patient Goals and CMS Choice Patient states their goals for this hospitalization and ongoing recovery are:: to go back to ALF. CMS Medicare.gov Compare Post Acute Care list provided to:: Patient Represenative (must comment)   Expected Discharge Plan and Services Expected Discharge Plan: Assisted Living     Living arrangements for the past 2 months: [place] Agency: [facility] Date [facility] Agency Contacted: 11/18/20 Time HH Agency Contacted: 1600 Representative spoke with at [place]: sent out referral  Prior Living Arrangements/Services Living arrangements for the past 2 months: [place] Lives with:: Facility Resident   Do you feel safe going back to the place where you live?: Yes          Activities of Daily Living Home Assistive Devices/Equipment: CBG Meter, Dentures (specify type) ADL Screening (condition at time of admission) Patient's cognitive ability adequate to safely complete daily activities?: Yes Is the patient deaf or have difficulty hearing?: No Does the patient have difficulty seeing, even when wearing glasses/contacts?: No Does the patient have difficulty concentrating, remembering, or making decisions?: No Patient able to express need for assistance with ADLs?: Yes Does the patient have difficulty dressing or  bathing?: No Independently performs ADLs?: Yes (appropriate for developmental age) Does the patient have difficulty walking or climbing stairs?: Yes Weakness of Legs: Both Weakness of Arms/Hands: None  Permission Sought/Granted     Emotional Assessment    Alcohol / Substance Use: Not Applicable Psych Involvement: No (comment)  Admission diagnosis:  HCAP (healthcare-associated pneumonia) [J18.9] Sepsis ([place]) [A41.9] Sepsis, due to unspecified organism, unspecified whether acute organ dysfunction present [facility]) [A41.9] Patient Active Problem List   Diagnosis Date Noted   Sepsis ([place]) 11/17/2020   Diarrhea    Falls frequently 09/26/2019   CKD (chronic kidney disease), stage III ([place]) 02/16/2019   DOE (dyspnea on exertion) 02/15/2019   Mitral regurgitation    Aortic insufficiency    Right knee dislocation 05/26/2011   Knee dislocation 04/03/2011   Carotid artery disease (HCC)    Ejection fraction    SOB (shortness of breath)    Dizziness    Hx of CABG    Dyslipidemia    Hypertension    CAD (coronary artery disease) of artery bypass graft    TIA (transient ischemic attack)    Bradycardia    Diabetes mellitus type 2 in nonobese ([place]) 12/20/2008   COLITIS, HX OF 12/20/2008   NEPHROLITHIASIS, HX OF 12/20/2008   ASBESTOS EXPOSURE, HX OF 12/20/2008   PCP:  Bonnita Nasuti, MD Pharmacy:   [place], [place] [id number] [place] [id number] Phone: [phone] Fax: [phone]  [facility] [address] [place] [address] Phone: [phone] Fax: [phone]  [facility] [id number] -  [address] [place] [address] Phone: [phone] Fax: [place], [place] [address] Phone: [phone] Fax: [phone]   Readmission Risk Interventions Readmission  Risk Prevention Plan 11/18/2020  Transportation Screening Complete  HRI or [place] Complete  Social Work Consult for [place] Planning/Counseling Complete  Palliative Care Screening Complete  Medication Review [employer]) Complete  Some recent data might be hidden

## 2020-11-18 NOTE — Progress Notes (Signed)
Had patient do flutter with me X10.  Good patient effort this time.  Patient appears a little stronger this evening.

## 2020-11-18 NOTE — Consult Note (Signed)
Consultation Note Date: 11/18/2020   Patient Name: [name]  DOB: Jul 01, 1926  MRN: [id number]  Age / Sex: 87 y.o., male  PCP: Bonnita Nasuti, MD Referring Physician: Barton Dubois, MD  Reason for Consultation: Establishing goals of care  HPI/Patient Profile: 87 y.o. male  with past medical history of HTN/HLD, CAD with 5 vessel CABG 1994 cath 2003-cath 2020, CKD 3, DM 2, depression/anxiety, BPH, colitis, PVD, history of bradycardia admitted on 11/17/2020 with severe sepsis from HCAP.   Clinical Assessment and Goals of Care: I have reviewed medical records including EPIC notes, labs and imaging, received report from RN, assessed the patient and then met at the bedside along with daughter/HC POA, Billie Clancey Welton and daughter Baker Janus, to discuss diagnosis prognosis, GOC, EOL wishes, disposition and options.  I introduced Palliative Medicine as specialized medical care for people living with serious illness. It focuses on providing relief from the symptoms and stress of a serious illness. The goal is to improve quality of life for both the patient and the family.  We discussed a brief life review of the patient.  Mr. Dusek lives at [facility].  His wife died at hospice of [place].   We talked about his acute health concerns with HCAP and the treatment plan.  Mr. Chizek tells me that he is "ready to die".  He and family do endorse the desire to continue with the current treatment plan to see if he can get over this pneumonia.   I attempted to elicit values and goals of care important to the patient.  Mr. Schifano states that his goal is to return to [facility], if accepted.  He states that he would not want short-term rehab in a nursing home, nor would he want physical therapy at [facility].  He shares that he feels this does not help him.  The difference between aggressive medical intervention  and comfort care was considered in light of the patient's goals of care.  At this point, patient and family do desire to continue to treat the treatable, time to improve from HCAP.  They share that they would like hospice of [facility] for at home "treat the treatable" hospice care.  They share that when Mr. Nevins gets sick again, if his illness is easily treatable and reversible, they would like to do so.  They are interested at transitioning to residential hospice, comfort care, if his illness is a reversible/untreatable.   Advanced directives, concepts specific to code status, and rehospitalization were considered and discussed.  Patient and family endorsed DNR.  They are unsure about rehospitalization.  Hospice and Palliative Care services outpatient were explained and offered.  Mr. Holzhauer wife died at residential hospice in [place].  They would like "treat the treatable" hospice care with [facility].  Discussed the importance of continued conversation with family and the medical providers regarding overall plan of care and treatment options, ensuring decisions are within the context of the patient's values and GOCs.    Questions  and concerns were addressed. The family was encouraged to call with questions or concerns.  PMT will continue to support holistically.  Conference with attending, bedside nursing staff, transition of care team related to patient condition, needs, goals of care, disposition.   HCPOA  HCPOA -daughter, Leeam Cedrone is healthcare surrogate.  Daughter, Baker Janus, is at bedside and agrees.    SUMMARY OF RECOMMENDATIONS   At this point continue current treatment plan Return to [facility], if accepted, with hospice of [facility] not rehospitalize if possible. Declines short-term rehab, declines home health PT   Code Status/Advance Care Planning: DNR -verified with patient and family  Symptom Management:  Per  hospitalist, no additional needs at this time.  Palliative Prophylaxis:  Frequent Pain Assessment and Oral Care  Additional Recommendations (Limitations, Scope, Preferences): Continue to treat the treatable, no CPR or intubation.  Considering transitioning to comfort care for the next illness  Psycho-social/Spiritual:  Desire for further Chaplaincy support:no Additional Recommendations: Caregiving  Support/Resources and Education on Hospice  Prognosis:  Unable to determine, based on outcomes.  6 months or less anticipated based on advanced age, decreasing functional status, chronic illness burden.  Discharge Planning:  Patient and family desire to return to [facility] with the benefits of "treat the treatable" hospice care with hospice of [facility].  They decline short-term rehab and home health PT.       Primary Diagnoses: Present on Admission:  Sepsis ([place])   I have reviewed the medical record, interviewed the patient and family, and examined the patient. The following aspects are pertinent.  Past Medical History:  Diagnosis Date   Aortic insufficiency    a. previously mild in 2012. b. trivial seen on echo in 05/2018.   Asbestos exposure    Bradycardia    MILD   CAD (coronary artery disease) 2003   a. s/p CABG in 1994. b. Cath 2003 patent grafts. c. DOE in 2020 felt anginal equivalent - cath 01/2019 s/p PCI/DES to the SVG-PDA.   Cancer [facility])    Skin   Carotid artery disease ([place]) 2001   BILATERAL 0-39%/  Follow with Dopplers by Dr. Woody Seller   Chronic combined systolic and diastolic CHF (congestive heart failure) ([place])    CKD (chronic kidney disease), stage III ([place])    Colitis    Diabetes mellitus    Dilated aortic root ([place]) 05/2018   Dizziness    He has had falls related to this / Chronic dizziness   Dyslipidemia    Hx of CABG    Hypertension    Nephrolithiasis    PVD (peripheral vascular disease) ([place])    Renal cyst    TIA (transient ischemic attack)  2007   ?? medical Rx    Social History   Socioeconomic History   Marital status: Widowed    Spouse name: Not on file   Number of children: 4   Years of education: Not on file   Highest education level: Not on file  Occupational History   Occupation: RETIRED  Tobacco Use   Smoking status: Former    Packs/day: 2.00    Years: 30.00    Pack years: 60.00    Types: Cigarettes    Start date: 12/23/1946    Quit date: 04/26/1980    Years since quitting: 40.5   Smokeless tobacco: Never  Vaping Use   Vaping Use: Never used  Substance and Sexual Activity   Alcohol use: Never    Alcohol/week: 0.0 standard drinks  Drug use: Never   Sexual activity: Not on file  Other Topics Concern   Not on file  Social History Narrative   Not on file   Social Determinants of Health   Financial Resource Strain: Not on file  Food Insecurity: Not on file  Transportation Needs: Not on file  Physical Activity: Not on file  Stress: Not on file  Social Connections: Not on file   Family History  Problem Relation Age of Onset   Heart attack Mother 40   Scheduled Meds:  aspirin EC  81 mg Oral Daily   atorvastatin  20 mg Oral q morning   Chlorhexidine Gluconate Cloth  6 each Topical Daily   citalopram  20 mg Oral Daily   clopidogrel  75 mg Oral Daily   fluticasone  2 spray Each Nare Daily   gabapentin  300 mg Oral QHS   heparin injection (subcutaneous)  5,000 Units Subcutaneous Q8H   hydrocortisone sod succinate (SOLU-CORTEF) inj  50 mg Intravenous Q8H   insulin aspart  0-5 Units Subcutaneous QHS   insulin aspart  0-9 Units Subcutaneous TID WC   ipratropium-albuterol  3 mL Nebulization TID   loratadine  10 mg Oral Daily   mouth rinse  15 mL Mouth Rinse BID   pramipexole  0.125 mg Oral QHS   risperiDONE  0.25 mg Oral QHS   tamsulosin  0.4 mg Oral QPC supper   traZODone  50 mg Oral QHS   Continuous Infusions:  ceFEPime (MAXIPIME) IV 2 g (11/18/20 1137)   PRN Meds:.acetaminophen **OR**  acetaminophen, hydrOXYzine, methocarbamol, ondansetron **OR** ondansetron (ZOFRAN) IV Medications Prior to Admission:  Prior to Admission medications   Medication Sig Start Date End Date Taking? Authorizing Provider  acetaminophen (TYLENOL) 500 MG tablet Take 1,000 mg by mouth every 8 (eight) hours as needed.   Yes [provider]  albuterol (VENTOLIN HFA) 108 (90 Base) MCG/ACT inhaler Inhale 2 puffs into the lungs every 4 (four) hours as needed for wheezing or shortness of breath.   Yes [provider]  aspirin EC 81 MG tablet Take 1 tablet (81 mg total) by mouth daily. 05/22/12  Yes Carlena Bjornstad, MD  atorvastatin (LIPITOR) 20 MG tablet Take 20 mg by mouth every morning.  10/26/17  Yes [provider]  citalopram (CELEXA) 20 MG tablet Take 20 mg by mouth daily.   Yes [provider]  clopidogrel (PLAVIX) 75 MG tablet Take 1 tablet (75 mg total) by mouth daily. 02/21/19  Yes Strader, [place] M, PA-C  fluticasone (FLONASE) 50 MCG/ACT nasal spray Place 2 sprays into both nostrils daily.   Yes [provider]  furosemide (LASIX) 80 MG tablet Take 80 mg by mouth 2 (two) times daily.   Yes [provider]  gabapentin (NEURONTIN) 100 MG capsule Take 300 mg by mouth at bedtime.   Yes [provider]  hydrOXYzine (ATARAX/VISTARIL) 25 MG tablet Take 25 mg by mouth every 6 (six) hours as needed for itching.   Yes [provider]  loratadine (CLARITIN) 10 MG tablet Take 10 mg by mouth daily.   Yes [provider]  magnesium hydroxide (MILK OF MAGNESIA) 400 MG/5ML suspension Take 30 mLs by mouth daily as needed for mild constipation.   Yes [provider]  metFORMIN (GLUCOPHAGE) 500 MG tablet Take 500 mg by mouth at bedtime.    Yes [provider]  metoprolol succinate (TOPROL-XL) 25 MG 24 hr tablet Take 25 mg by mouth daily.  Yes [provider]  Multiple Vitamin (MULTIVITAMIN) tablet Take 1 tablet by  mouth daily.   Yes [provider]  polyethylene glycol (MIRALAX / GLYCOLAX) 17 g packet Take 17 g by mouth daily.   Yes [provider]  potassium chloride SA (KLOR-CON) 20 MEQ tablet Take 20 mEq by mouth daily.   Yes [provider]  pramipexole (MIRAPEX) 0.125 MG tablet Take 0.125 mg by mouth at bedtime.   Yes [provider]  risperiDONE (RISPERDAL) 0.25 MG tablet Take 1 tablet (0.25 mg total) by mouth at bedtime. 09/28/19  Yes Danford, Suann Larry, MD  sennosides-docusate sodium (SENOKOT-S) 8.6-50 MG tablet Take 2 tablets by mouth daily as needed for constipation.   Yes [provider]  simethicone (MYLICON) 80 MG chewable tablet Chew 80 mg by mouth every 6 (six) hours as needed for flatulence.   Yes [provider]  tamsulosin (FLOMAX) 0.4 MG CAPS capsule Take 0.4 mg by mouth daily after supper.   Yes [provider]  traZODone (DESYREL) 50 MG tablet Take 50 mg by mouth at bedtime.   Yes [provider]  nitroGLYCERIN (NITROSTAT) 0.4 MG SL tablet Place 0.4 mg under the tongue every 5 (five) minutes x 3 doses as needed for chest pain (if no relief after 2nd dose, proceed to the ED for an evaluation or call 911).    [provider]   Allergies  Allergen Reactions   Sulfonamide Derivatives Rash   Tetracycline Rash   Review of Systems  Unable to perform ROS: Age   Physical Exam Vitals and nursing note reviewed.  Constitutional:      General: He is not in acute distress.    Appearance: He is not ill-appearing.  HENT:     Head: Normocephalic and atraumatic.     Mouth/Throat:     Mouth: Mucous membranes are moist.  Cardiovascular:     Rate and Rhythm: Normal rate.  Pulmonary:     Effort: Pulmonary effort is normal. No respiratory distress.  Skin:    General: Skin is warm and dry.  Neurological:     Mental Status: He is alert. Mental status is at baseline.     Comments: Oriented to person and place   Psychiatric:        Mood and Affect: Mood normal.        Behavior: Behavior normal.    Vital Signs: BP (!) 123/49   Pulse 67   Temp 98.7 F (37.1 C) (Oral)   Resp 20   Ht _0  (1.803 m)   Wt 88.9 kg   SpO2 95%   BMI 27.33 kg/m  Pain Scale: 0-10 POSS *See Group Information*: 1-Acceptable,Awake and alert Pain Score: 0-No pain   SpO2: SpO2: 95 % O2 Device:SpO2: 95 % O2 Flow Rate: .O2 Flow Rate (L/min): 4 L/min  IO: Intake/output summary:  Intake/Output Summary (Last 24 hours) at 11/18/2020 1328 Last data filed at 11/18/2020 0600 Gross per 24 hour  Intake 4533.48 ml  Output 500 ml  Net 4033.48 ml    LBM: Last BM Date:  (Patient unable to recall last BM) Baseline Weight: Weight: 85.7 kg Most recent weight: Weight: 88.9 kg     Palliative Assessment/Data:   Flowsheet Rows    Flowsheet Row Most Recent Value  Intake Tab   Referral Department Hospitalist  Unit at Time of Referral ICU  Palliative Care Primary Diagnosis Sepsis/Infectious Disease  Date Notified 11/18/20  Palliative Care Type New Palliative care  Reason for referral Clarify Goals of Care  Date of Admission 11/17/20  Date first seen by Palliative Care 11/18/20  # of days Palliative referral response time 0 Day(s)  # of days IP prior to Palliative referral 1  Clinical Assessment   Palliative Performance Scale Score 30%  Pain Max last 24 hours Not able to report  Pain Min Last 24 hours Not able to report  Dyspnea Max Last 24 Hours Not able to report  Dyspnea Min Last 24 hours Not able to report  Psychosocial & Spiritual Assessment   Palliative Care Outcomes        Time In: 0940 Time Out: 1050 Time Total: 70 minutes  Greater than 50%  of this time was spent counseling and coordinating care related to the above assessment and plan.  Signed by: Drue Novel, NP   Please contact Palliative Medicine Team phone at [phone] for questions and concerns.  For individual provider: See Shea Evans

## 2020-11-18 NOTE — Progress Notes (Signed)
Atropine administered as ordered d/t hypotension and HR of 45-55 Afib. Patient alert and responsive. Patient is not in distress. Vital signs post medication administration 133/66 MAP 89, HR 95, RR 25, and O2 sat 93% 3L. Will continue to monitor. MD updated and notified of patient's response to the medication.

## 2020-11-18 NOTE — Progress Notes (Signed)
PROGRESS NOTE    [name]  [id number] DOB: May 26, 1926 DOA: 11/17/2020 PCP: Bonnita Nasuti, MD   Chief Complaint  Patient presents with   Fever    Brief admission narrative:  [name] is a 87 y.o. male with medical history significant of hypertension, coronary artery disease, chronic kidney disease stage IIIb, type 2 diabetes, hyperlipidemia, depression/anxiety and BPH; who presented to the emergency department from his skilled nursing facility secondary to productive cough, general malaise and shortness of breath.  Symptoms have been present for the last 3 days or so and worsening according to patient.  At the time the EMS arrived to his facility patient had oxygen saturation of 88-89 on room air.  Patient denies chest pain, no nausea, no vomiting, no hemoptysis, no abdominal pain, no dysuria, no focal weakness, no sick contacts or any other complaints.   Patient Is now vaccinated against COVID; COVID PCR in the emergency department was negative.  Assessment & Plan: 1-Severe sepsis secondary to pneumonia (left lower lobe) -Afebrile currently; WBC is trending down and patient reported improvement in his breathing -Still requiring oxygen supplementation and with ongoing intermittent coughing spells. -MRSA PCR negative; vancomycin discontinued. -Continue IV cefepime -Continue as needed antitussive medication, flutter valve, Solu-Cortef and bronchodilators. -Follow clinical response  2-history of coronary disease/CHF -Appears to have a history of combined heart failure -Presented dehydrated -Tolerated well fluid resuscitation -Continue to follow daily weights and strict I's and O -Continue treatment with aspirin and Plavix-no chest pain.  3-hypertension -Blood pressure remains soft -Continue holding antihypertensive agents.  4-restless leg syndrome and hyperlipidemia -Continue statins and Mirapex.  5-depression/anxiety -Continue treatment with Celexa, trazodone  and Risperdal -No suicidal ideation or hallucination -Stable mood on exam  6-chronic kidney disease a stage IIIb -Appears to be stable and at baseline -Continue to minimize nephrotoxic agent -Maintain adequate hydration -Continue to follow renal function trend.  7-type 2 diabetes with nephropathy -Continue modified carbohydrate diet -Continue sliding scale insulin -Follow CBGs and adjust hypoglycemic regimen as needed -Continue holding oral diabetes medication while inpatient.  DVT prophylaxis:  Code Status: DNR Family Communication: No family at bedside.  Palliative care able to update and discuss goals of care with family members. Disposition:   Status is: Inpatient  Remains inpatient appropriate because:IV treatments appropriate due to intensity of illness or inability to take PO  Dispo: The patient is from: ALF              Anticipated d/c is to: ALF              Patient currently is not medically stable to d/c.   Difficult to place patient No       Consultants:  Palliative care  Procedures:  See below for x-ray reports.  Antimicrobials:  Cefepime and vancomycin at time of admission; given negative MRSA PCR vancomycin has been discontinued today (11/18/20).   Subjective: No fever, no chest pain, no nausea, no vomiting.  Reports breathing is better today.  Still requiring oxygen supplementation to maintain saturation and experiencing intermittent coughing spells.  Blood pressure has improved lactic acid within normal limits now.  Objective: Vitals:   11/18/20 1614 11/18/20 1637 11/18/20 1700 11/18/20 1701  BP:  134/62 118/64   Pulse: 62 72  78  Resp: (!) 26  18   Temp: 97.9 F (36.6 C)     TempSrc: Oral     SpO2: 93% 92% 90% 93%  Weight:      Height:  Intake/Output Summary (Last 24 hours) at 11/18/2020 1758 Last data filed at 11/18/2020 1751 Gross per 24 hour  Intake 1887.67 ml  Output 900 ml  Net 987.67 ml   Filed Weights   11/17/20 0936  11/17/20 1422 11/18/20 0400  Weight: 85.7 kg 85.1 kg 88.9 kg    Examination:  General exam: Appears calm and reporting improvement in his breathing.  Still requiring oxygen supplementation; no chest pain, no nausea, no vomiting. Respiratory system: Positive rhonchi bilaterally; no using accessory muscle. Cardiovascular system: RRR, no rubs, no gallops, no JVD. No pedal edema. Gastrointestinal system: Abdomen is nondistended, soft and nontender. No organomegaly or masses felt. Normal bowel sounds heard. Central nervous system: Alert and oriented. No focal neurological deficits. Extremities: No cyanosis or clubbing. Skin: No petechiae. Psychiatry: Mood & affect appropriate.     Data Reviewed: I have personally reviewed following labs and imaging studies  CBC: Recent Labs  Lab 11/17/20 1009 11/18/20 0441  WBC 26.7* 18.3*  NEUTROABS 24.5*  --   HGB 12.1* 9.4*  HCT 36.2* 29.5*  MCV 94.5 97.4  PLT 260 [id number]    Basic Metabolic Panel: Recent Labs  Lab 11/17/20 1009 11/18/20 0441  NA 133* 138  K 4.4 4.1  CL 94* 101  CO2 27 29  GLUCOSE 292* 201*  BUN 45* 34*  CREATININE 2.03* 1.34*  CALCIUM 8.9 8.5*  MG 2.0  --   PHOS 4.2  --     GFR: Estimated Creatinine Clearance: 38.2 mL/min (A) (by C-G formula based on SCr of 1.34 mg/dL (H)).  Liver Function Tests: Recent Labs  Lab 11/17/20 1009  AST 20  ALT 15  ALKPHOS 63  BILITOT 1.5*  PROT 7.8  ALBUMIN 3.9    CBG: Recent Labs  Lab 11/17/20 1645 11/17/20 2035 11/18/20 0732 11/18/20 1118 11/18/20 1613  GLUCAP 197* 169* 159* 189* 155*     Recent Results (from the past 240 hour(s))  Culture, blood (Routine x 2)     Status: None (Preliminary result)   Collection Time: 11/17/20 10:10 AM   Specimen: Right Antecubital; Blood  Result Value Ref Range Status   Specimen Description   Final    RIGHT ANTECUBITAL BOTTLES DRAWN AEROBIC AND ANAEROBIC   Special Requests Blood Culture adequate volume  Final   Culture    Final    NO GROWTH < 24 HOURS Performed at [facility], [address], [place], [place] [address]    Report Status PENDING  Incomplete  Culture, blood (Routine x 2)     Status: None (Preliminary result)   Collection Time: 11/17/20 10:24 AM   Specimen: BLOOD RIGHT WRIST  Result Value Ref Range Status   Specimen Description   Final    BLOOD RIGHT WRIST BOTTLES DRAWN AEROBIC AND ANAEROBIC   Special Requests Blood Culture adequate volume  Final   Culture   Final    NO GROWTH < 24 HOURS Performed at [facility], [address], [place], [place] [address]    Report Status PENDING  Incomplete  Resp Panel by RT-PCR (Flu A&B, Covid) Nasopharyngeal Swab     Status: None   Collection Time: 11/17/20 11:00 AM   Specimen: Nasopharyngeal Swab; Nasopharyngeal(NP) swabs in vial transport medium  Result Value Ref Range Status   SARS Coronavirus 2 by RT PCR NEGATIVE NEGATIVE Final    Comment: (NOTE) SARS-CoV-2 target nucleic acids are NOT DETECTED.  The SARS-CoV-2 RNA is generally detectable in upper respiratory specimens during the acute phase of infection.  The lowest concentration of SARS-CoV-2 viral copies this assay can detect is 138 copies/mL. A negative result does not preclude SARS-Cov-2 infection and should not be used as the sole basis for treatment or other patient management decisions. A negative result may occur with  improper specimen collection/handling, submission of specimen other than nasopharyngeal swab, presence of viral mutation(s) within the areas targeted by this assay, and inadequate number of viral copies(<138 copies/mL). A negative result must be combined with clinical observations, patient history, and epidemiological information. The expected result is Negative.  Fact Sheet for Patients:  EntrepreneurPulse.com.au  Fact Sheet for Healthcare Providers:  IncredibleEmployment.be  This test is no t yet approved or cleared by the  [place] FDA and  has been authorized for detection and/or diagnosis of SARS-CoV-2 by FDA under an Emergency Use Authorization (EUA). This EUA will remain  in effect (meaning this test can be used) for the duration of the COVID-19 declaration under Section 564(b)(1) of the Act, 21 U.S.C.section 360bbb-3(b)(1), unless the authorization is terminated  or revoked sooner.       Influenza A by PCR NEGATIVE NEGATIVE Final   Influenza B by PCR NEGATIVE NEGATIVE Final    Comment: (NOTE) The Xpert Xpress SARS-CoV-2/FLU/RSV plus assay is intended as an aid in the diagnosis of influenza from Nasopharyngeal swab specimens and should not be used as a sole basis for treatment. Nasal washings and aspirates are unacceptable for Xpert Xpress SARS-CoV-2/FLU/RSV testing.  Fact Sheet for Patients: EntrepreneurPulse.com.au  Fact Sheet for Healthcare Providers: IncredibleEmployment.be  This test is not yet approved or cleared by the [place] FDA and has been authorized for detection and/or diagnosis of SARS-CoV-2 by FDA under an Emergency Use Authorization (EUA). This EUA will remain in effect (meaning this test can be used) for the duration of the COVID-19 declaration under Section 564(b)(1) of the Act, 21 U.S.C. section 360bbb-3(b)(1), unless the authorization is terminated or revoked.  Performed at [facility], [address], [place], [place] [address]   MRSA Next Gen by PCR, Nasal     Status: None   Collection Time: 11/17/20  3:06 PM   Specimen: Nasal Mucosa; Nasal Swab  Result Value Ref Range Status   MRSA by PCR Next Gen NOT DETECTED NOT DETECTED Final    Comment: (NOTE) The GeneXpert MRSA Assay (FDA approved for NASAL specimens only), is one component of a comprehensive MRSA colonization surveillance program. It is not intended to diagnose MRSA infection nor to guide or monitor treatment for MRSA infections. Test performance is not FDA  approved in patients less than 40 years old. Performed at [facility], [address], [place], [place] [address]     Radiology Studies: [facility] Chest [facility] 1 View  Result Date: 11/17/2020 CLINICAL DATA:  Sepsis with weakness EXAM: PORTABLE CHEST 1 VIEW COMPARISON:  09/29/2019 FINDINGS: Left humeral hardware again seen. 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 Unchanged mild cardiomegaly and mild pulmonary vascular congestion. Unchanged small right pleural effusion with adjacent atelectasis. There is new patchy opacities at the left lung base. IMPRESSION: New patchy opacity at the left lung base may be due to atelectasis or pneumonia. Unchanged small right pleural effusion. Electronically Signed   By: Miachel Roux M.D.   On: 11/17/2020 11:12    Scheduled Meds:  aspirin EC  81 mg Oral Daily   atorvastatin  20 mg Oral q morning   Chlorhexidine Gluconate Cloth  6 each Topical Daily   citalopram  20 mg Oral Daily  clopidogrel  75 mg Oral Daily   fluticasone  2 spray Each Nare Daily   gabapentin  300 mg Oral QHS   heparin injection (subcutaneous)  5,000 Units Subcutaneous Q8H   hydrocortisone sod succinate (SOLU-CORTEF) inj  50 mg Intravenous Q8H   insulin aspart  0-5 Units Subcutaneous QHS   insulin aspart  0-9 Units Subcutaneous TID WC   ipratropium-albuterol  3 mL Nebulization TID   loratadine  10 mg Oral Daily   mouth rinse  15 mL Mouth Rinse BID   pramipexole  0.125 mg Oral QHS   risperiDONE  0.25 mg Oral QHS   tamsulosin  0.4 mg Oral QPC supper   traZODone  50 mg Oral QHS   Continuous Infusions:  ceFEPime (MAXIPIME) IV 2 g (11/18/20 1137)     LOS: 1 day    Time spent: 35 minutes   Barton Dubois, MD [facility]   To contact the attending provider between 7A-7P or the covering provider during after hours 7P-7A, please log into the web site [email or web address] and access using universal [place] password for that web site. If you do not have the password, please call the  hospital operator.  11/18/2020, 5:58 PM

## 2020-11-19 DIAGNOSIS — N179 Acute kidney failure, unspecified: Secondary | ICD-10-CM

## 2020-11-19 DIAGNOSIS — N1832 Chronic kidney disease, stage 3b: Secondary | ICD-10-CM

## 2020-11-19 DIAGNOSIS — J9601 Acute respiratory failure with hypoxia: Secondary | ICD-10-CM

## 2020-11-19 DIAGNOSIS — J181 Lobar pneumonia, unspecified organism: Secondary | ICD-10-CM | POA: Diagnosis not present

## 2020-11-19 DIAGNOSIS — A419 Sepsis, unspecified organism: Secondary | ICD-10-CM | POA: Diagnosis not present

## 2020-11-19 LAB — CBC
HCT: 32.7 % — ABNORMAL LOW (ref 39.0–52.0)
Hemoglobin: 10.5 g/dL — ABNORMAL LOW (ref 13.0–17.0)
MCH: 31.6 pg (ref 26.0–34.0)
MCHC: 32.1 g/dL (ref 30.0–36.0)
MCV: 98.5 fL (ref 80.0–100.0)
Platelets: 231 10*3/uL (ref 150–400)
RBC: 3.32 MIL/uL — ABNORMAL LOW (ref 4.22–5.81)
RDW: 13.5 % (ref 11.5–15.5)
WBC: 13.1 10*3/uL — ABNORMAL HIGH (ref 4.0–10.5)
nRBC: 0 % (ref 0.0–0.2)

## 2020-11-19 LAB — BASIC METABOLIC PANEL
Anion gap: 7 (ref 5–15)
BUN: 20 mg/dL (ref 8–23)
CO2: 27 mmol/L (ref 22–32)
Calcium: 8.3 mg/dL — ABNORMAL LOW (ref 8.9–10.3)
Chloride: 105 mmol/L (ref 98–111)
Creatinine, Ser: 1.02 mg/dL (ref 0.61–1.24)
GFR, Estimated: >60 mL/min (ref >60)
Glucose, Bld: 195 mg/dL — ABNORMAL HIGH (ref 70–99)
Potassium: 4 mmol/L (ref 3.5–5.1)
Sodium: 139 mmol/L (ref 135–145)

## 2020-11-19 LAB — URINE CULTURE: Culture: NO GROWTH

## 2020-11-19 LAB — RESP PANEL BY RT-PCR (FLU A&B, COVID) ARPGX2
Influenza A by PCR: NEGATIVE
Influenza B by PCR: NEGATIVE
SARS Coronavirus 2 by RT PCR: NEGATIVE

## 2020-11-19 LAB — GLUCOSE, CAPILLARY
Glucose-Capillary: 151 mg/dL — ABNORMAL HIGH (ref 70–99)
Glucose-Capillary: 175 mg/dL — ABNORMAL HIGH (ref 70–99)
Glucose-Capillary: 147 mg/dL — ABNORMAL HIGH (ref 70–99)
Glucose-Capillary: 110 mg/dL — ABNORMAL HIGH (ref 70–99)

## 2020-11-19 LAB — MAGNESIUM: Magnesium: 2 mg/dL (ref 1.7–2.4)

## 2020-11-19 MED ORDER — SODIUM CHLORIDE 0.9 % IV SOLN
2.0000 g | Freq: Two times a day (BID) | INTRAVENOUS | Status: AC
  Administered 2020-11-19 – 2020-11-20 (×3): 2 g via INTRAVENOUS
  Filled 2020-11-19 (×2): qty 2

## 2020-11-19 NOTE — TOC Progression Note (Addendum)
Transition of Care [facility]) - Progression Note    Patient Details  Name: [name] MRN: [id number] Date of Birth: 08-Mar-1927  [facility] CM/SW Contact  Boneta Lucks, RN Phone Number: 11/19/2020, 10:11 AM  Clinical Narrative:   Referral made to [facility]. DC plan is to return to [facility]. TOC spoke with Pam at [facility], Patient will need a new COVID test.   Expected Discharge Plan: Assisted Living Barriers to Discharge: Continued Medical Work up  Expected Discharge Plan and Services Expected Discharge Plan: Assisted Living    Living arrangements for the past 2 months: [place] Date [facility] Agency Contacted: 11/18/20 Time HH Agency Contacted: 1600 Representative spoke with at [place]: sent out referral  Readmission Risk Interventions Readmission Risk Prevention Plan 11/18/2020  Transportation Screening Complete  HRI or [place] Complete  Social Work Consult for [place] Planning/Counseling Complete  Palliative Care Screening Complete  Medication Review [employer]) Complete  Some recent data might be hidden

## 2020-11-19 NOTE — Progress Notes (Signed)
PROGRESS NOTE  [name] [id number] DOB: 10/25/1926 DOA: 11/17/2020 PCP: Bonnita Nasuti, MD  Brief History:  87 y.o. male with medical history significant of hypertension, coronary artery disease, chronic kidney disease stage IIIb, type 2 diabetes, hyperlipidemia, depression/anxiety and BPH; who presented to the emergency department from his skilled nursing facility secondary to productive cough, general malaise and shortness of breath.  Symptoms have been present for the last 3 days or so and worsening according to patient.  At the time the EMS arrived to his facility patient had oxygen saturation of 88-89 on room air.  Patient denies chest pain, no nausea, no vomiting, no hemoptysis, no abdominal pain, no dysuria, no focal weakness, no sick contacts or any other complaints.   Patient Is now vaccinated against COVID; COVID PCR in the emergency department was negative.    Assessment/Plan: 1-Severe sepsis secondary to pneumonia (left lower lobe) -present on admission -WBC is trending down and patient reported improvement in his breathing -Still requiring oxygen supplementation and with ongoing intermittent coughing spells. -MRSA PCR negative; vancomycin discontinued. -Continue IV cefepime -Continue as needed antitussive medication, flutter valve, Solu-Cortef and bronchodilators. -Follow clinical response  2-Acute Respiratory failure with hypoxia -stable on 4L [place]   3-coronary disease/chronic systolic and diastolic CHF -Presented dehydrated on presentation -Tolerated well fluid resuscitation -Continue to follow daily weights and strict I's and O -Continue treatment with aspirin and Plavix-no chest pain.   4-hypertension -Blood pressure remains soft -Continue holding antihypertensive agents.   5-restless leg syndrome and hyperlipidemia -Continue statins and Mirapex.   6-depression/anxiety -Continue treatment with Celexa, trazodone and Risperdal -No suicidal  ideation or hallucination -Stable mood on exam   7-AKI on chronic kidney disease a stage IIIb -presented with serum creatinine 2.05 -baseline creatinine 1.1-1.3 -due to sepsis and hemodynamic changes -Maintain adequate hydration -Continue to follow renal function trend.   7-type 2 diabetes with nephropathy -Continue modified carbohydrate diet -Continue sliding scale insulin -Follow CBGs and adjust hypoglycemic regimen as needed -Continue holding oral diabetes medication while inpatient. -7/25 A1C--9.5       Status is: Inpatient  Remains inpatient appropriate because:Hemodynamically unstable  Dispo: The patient is from: ALF              Anticipated d/c is to: ALF              Patient currently is not medically stable to d/c.   Difficult to place patient No        Family Communication:   daughter updated at bedside 7/27  Consultants:  none  Code Status:  DNR  DVT Prophylaxis:  [place] Heparin   Procedures: As Listed in Progress Note Above  Antibiotics: Cefepime 7/25>>      Subjective: Patient denies fevers, chills, headache, chest pain, dyspnea, nausea, vomiting, diarrhea, abdominal pain, dysuria   Objective: Vitals:   11/19/20 1000 11/19/20 1100 11/19/20 1131 11/19/20 1145  BP: (!) 142/55 (!) 156/65    Pulse: 69 65  66  Resp: (!) 26 (!) 25  (!) 23  Temp:   98.3 F (36.8 C)   TempSrc:   Oral   SpO2: 93% 96%  94%  Weight:      Height:        Intake/Output Summary (Last 24 hours) at 11/19/2020 1207 Last data filed at 11/19/2020 0400 Gross per 24 hour  Intake 742.27 ml  Output 950 ml  Net -207.73 ml   Weight change:  Exam:  General:  Pt is alert, follows commands appropriately, not in acute distress HEENT: No icterus, No thrush, No neck mass, [place]/AT Cardiovascular: RRR, S1/S2, no rubs, no gallops Respiratory: bibasilar rales, L>R Abdomen: Soft/+BS, non tender, non distended, no guarding Extremities: No edema, No lymphangitis, No petechiae, No  rashes, no synovitis   Data Reviewed: I have personally reviewed following labs and imaging studies Basic Metabolic Panel: Recent Labs  Lab 11/17/20 1009 11/18/20 0441  NA 133* 138  K 4.4 4.1  CL 94* 101  CO2 27 29  GLUCOSE 292* 201*  BUN 45* 34*  CREATININE 2.03* 1.34*  CALCIUM 8.9 8.5*  MG 2.0  --   PHOS 4.2  --    Liver Function Tests: Recent Labs  Lab 11/17/20 1009  AST 20  ALT 15  ALKPHOS 63  BILITOT 1.5*  PROT 7.8  ALBUMIN 3.9   No results for input(s): LIPASE, AMYLASE in the last 168 hours. No results for input(s): AMMONIA in the last 168 hours. Coagulation Profile: Recent Labs  Lab 11/17/20 1009 11/18/20 0441  INR 1.1 1.2   CBC: Recent Labs  Lab 11/17/20 1009 11/18/20 0441  WBC 26.7* 18.3*  NEUTROABS 24.5*  --   HGB 12.1* 9.4*  HCT 36.2* 29.5*  MCV 94.5 97.4  PLT 260 214   Cardiac Enzymes: No results for input(s): CKTOTAL, CKMB, CKMBINDEX, TROPONINI in the last 168 hours. BNP: Invalid input(s): POCBNP CBG: Recent Labs  Lab 11/18/20 1118 11/18/20 1613 11/18/20 2139 11/19/20 0802 11/19/20 1132  GLUCAP 189* 155* 217* 151* 175*   HbA1C: Recent Labs    11/17/20 1009  HGBA1C 9.5*   Urine analysis:    Component Value Date/Time   COLORURINE YELLOW 11/17/2020 1505   APPEARANCEUR CLEAR 11/17/2020 1505   LABSPEC 1.015 11/17/2020 1505   PHURINE 5.0 11/17/2020 1505   GLUCOSEU NEGATIVE 11/17/2020 1505   HGBUR NEGATIVE 11/17/2020 1505   BILIRUBINUR NEGATIVE 11/17/2020 1505   KETONESUR NEGATIVE 11/17/2020 1505   PROTEINUR NEGATIVE 11/17/2020 1505   NITRITE NEGATIVE 11/17/2020 1505   LEUKOCYTESUR NEGATIVE 11/17/2020 1505   Sepsis Labs: '@LABRCNTIP'$ (procalcitonin:4,lacticidven:4) ) Recent Results (from the past 240 hour(s))  Culture, blood (Routine x 2)     Status: None (Preliminary result)   Collection Time: 11/17/20 10:10 AM   Specimen: Right Antecubital; Blood  Result Value Ref Range Status   Specimen Description   Final    RIGHT  ANTECUBITAL BOTTLES DRAWN AEROBIC AND ANAEROBIC   Special Requests Blood Culture adequate volume  Final   Culture   Final    NO GROWTH 2 DAYS Performed at [facility], [address] [place],  [address]    Report Status PENDING  Incomplete  Culture, blood (Routine x 2)     Status: None (Preliminary result)   Collection Time: 11/17/20 10:24 AM   Specimen: BLOOD RIGHT WRIST  Result Value Ref Range Status   Specimen Description   Final    BLOOD RIGHT WRIST BOTTLES DRAWN AEROBIC AND ANAEROBIC   Special Requests Blood Culture adequate volume  Final   Culture   Final    NO GROWTH 2 DAYS Performed at [facility], [address], [place],  [address]    Report Status PENDING  Incomplete  Resp Panel by RT-PCR (Flu A&B, Covid) Nasopharyngeal Swab     Status: None   Collection Time: 11/17/20 11:00 AM   Specimen: Nasopharyngeal Swab; Nasopharyngeal(NP) swabs in vial transport medium  Result Value Ref Range Status   SARS Coronavirus  2 by RT PCR NEGATIVE NEGATIVE Final    Comment: (NOTE) SARS-CoV-2 target nucleic acids are NOT DETECTED.  The SARS-CoV-2 RNA is generally detectable in upper respiratory specimens during the acute phase of infection. The lowest concentration of SARS-CoV-2 viral copies this assay can detect is 138 copies/mL. A negative result does not preclude SARS-Cov-2 infection and should not be used as the sole basis for treatment or other patient management decisions. A negative result may occur with  improper specimen collection/handling, submission of specimen other than nasopharyngeal swab, presence of viral mutation(s) within the areas targeted by this assay, and inadequate number of viral copies(<138 copies/mL). A negative result must be combined with clinical observations, patient history, and epidemiological information. The expected result is Negative.  Fact Sheet for Patients:  EntrepreneurPulse.com.au  Fact Sheet for Healthcare  Providers:  IncredibleEmployment.be  This test is no t yet approved or cleared by the [place] FDA and  has been authorized for detection and/or diagnosis of SARS-CoV-2 by FDA under an Emergency Use Authorization (EUA). This EUA will remain  in effect (meaning this test can be used) for the duration of the COVID-19 declaration under Section 564(b)(1) of the Act, 21 U.S.C.section 360bbb-3(b)(1), unless the authorization is terminated  or revoked sooner.       Influenza A by PCR NEGATIVE NEGATIVE Final   Influenza B by PCR NEGATIVE NEGATIVE Final    Comment: (NOTE) The Xpert Xpress SARS-CoV-2/FLU/RSV plus assay is intended as an aid in the diagnosis of influenza from Nasopharyngeal swab specimens and should not be used as a sole basis for treatment. Nasal washings and aspirates are unacceptable for Xpert Xpress SARS-CoV-2/FLU/RSV testing.  Fact Sheet for Patients: EntrepreneurPulse.com.au  Fact Sheet for Healthcare Providers: IncredibleEmployment.be  This test is not yet approved or cleared by the [place] FDA and has been authorized for detection and/or diagnosis of SARS-CoV-2 by FDA under an Emergency Use Authorization (EUA). This EUA will remain in effect (meaning this test can be used) for the duration of the COVID-19 declaration under Section 564(b)(1) of the Act, 21 U.S.C. section 360bbb-3(b)(1), unless the authorization is terminated or revoked.  Performed at [facility], [address], [place], [place] [address]   Urine Culture     Status: None   Collection Time: 11/17/20  3:05 PM   Specimen: Urine, Catheterized  Result Value Ref Range Status   Specimen Description   Final    URINE, CATHETERIZED Performed at [facility], [address], [place], [place] [address]    Special Requests   Final    NONE Performed at [facility], [address], [place], [place] [address]    Culture   Final    NO  GROWTH Performed at [facility], [place] [address], [place], [place] [address]    Report Status 11/19/2020 FINAL  Final  MRSA Next Gen by PCR, Nasal     Status: None   Collection Time: 11/17/20  3:06 PM   Specimen: Nasal Mucosa; Nasal Swab  Result Value Ref Range Status   MRSA by PCR Next Gen NOT DETECTED NOT DETECTED Final    Comment: (NOTE) The GeneXpert MRSA Assay (FDA approved for NASAL specimens only), is one component of a comprehensive MRSA colonization surveillance program. It is not intended to diagnose MRSA infection nor to guide or monitor treatment for MRSA infections. Test performance is not FDA approved in patients less than 16 years old. Performed at [facility], [address], [place], [place] [address]  Scheduled Meds:  aspirin EC  81 mg Oral Daily   atorvastatin  20 mg Oral q morning   Chlorhexidine Gluconate Cloth  6 each Topical Daily   citalopram  20 mg Oral Daily   clopidogrel  75 mg Oral Daily   fluticasone  2 spray Each Nare Daily   gabapentin  300 mg Oral QHS   heparin injection (subcutaneous)  5,000 Units Subcutaneous Q8H   hydrocortisone sod succinate (SOLU-CORTEF) inj  50 mg Intravenous Q8H   insulin aspart  0-5 Units Subcutaneous QHS   insulin aspart  0-9 Units Subcutaneous TID WC   ipratropium-albuterol  3 mL Nebulization TID   loratadine  10 mg Oral Daily   mouth rinse  15 mL Mouth Rinse BID   pramipexole  0.125 mg Oral QHS   risperiDONE  0.25 mg Oral QHS   tamsulosin  0.4 mg Oral QPC supper   traZODone  50 mg Oral QHS   Continuous Infusions:  ceFEPime (MAXIPIME) IV 2 g (11/19/20 1054)    Procedures/Studies: DG Chest Port 1 View  Result Date: 11/17/2020 CLINICAL DATA:  Sepsis with weakness EXAM: PORTABLE CHEST 1 VIEW COMPARISON:  09/29/2019 FINDINGS: Left humeral hardware again seen. 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 Unchanged mild cardiomegaly and mild pulmonary vascular congestion. Unchanged small right pleural effusion  with adjacent atelectasis. There is new patchy opacities at the left lung base. IMPRESSION: New patchy opacity at the left lung base may be due to atelectasis or pneumonia. Unchanged small right pleural effusion. Electronically Signed   By: Miachel Roux M.D.   On: 11/17/2020 11:12    Orson Eva, DO  [facility]  If 7PM-7AM, please contact night-coverage [email or web address] Password [email or web address] 11/19/2020, 12:07 PM   LOS: 2 days

## 2020-11-19 NOTE — Progress Notes (Signed)
Patient tried to get out of bed to urinate. Explained to patient that he had the male wick on and could just pee.  Patient urinated, but still felt like he had to go.  Changed bed to chair position to assist with urination.  Another 100 voided.  Bladder scan revealed 608 mls.  In and out cath done with 350 mls of urine.

## 2020-11-20 DIAGNOSIS — J181 Lobar pneumonia, unspecified organism: Secondary | ICD-10-CM | POA: Diagnosis not present

## 2020-11-20 DIAGNOSIS — J9620 Acute and chronic respiratory failure, unspecified whether with hypoxia or hypercapnia: Secondary | ICD-10-CM | POA: Diagnosis not present

## 2020-11-20 DIAGNOSIS — N179 Acute kidney failure, unspecified: Secondary | ICD-10-CM | POA: Diagnosis not present

## 2020-11-20 DIAGNOSIS — N189 Chronic kidney disease, unspecified: Secondary | ICD-10-CM | POA: Diagnosis not present

## 2020-11-20 DIAGNOSIS — E1100 Type 2 diabetes mellitus with hyperosmolarity without nonketotic hyperglycemic-hyperosmolar coma (NKHHC): Secondary | ICD-10-CM | POA: Diagnosis not present

## 2020-11-20 DIAGNOSIS — M6281 Muscle weakness (generalized): Secondary | ICD-10-CM | POA: Diagnosis not present

## 2020-11-20 DIAGNOSIS — A419 Sepsis, unspecified organism: Secondary | ICD-10-CM | POA: Diagnosis not present

## 2020-11-20 DIAGNOSIS — E1165 Type 2 diabetes mellitus with hyperglycemia: Secondary | ICD-10-CM | POA: Diagnosis not present

## 2020-11-20 DIAGNOSIS — J189 Pneumonia, unspecified organism: Secondary | ICD-10-CM | POA: Diagnosis not present

## 2020-11-20 DIAGNOSIS — F0391 Unspecified dementia with behavioral disturbance: Secondary | ICD-10-CM | POA: Diagnosis not present

## 2020-11-20 DIAGNOSIS — J9601 Acute respiratory failure with hypoxia: Secondary | ICD-10-CM | POA: Diagnosis not present

## 2020-11-20 DIAGNOSIS — I5040 Unspecified combined systolic (congestive) and diastolic (congestive) heart failure: Secondary | ICD-10-CM | POA: Diagnosis not present

## 2020-11-20 DIAGNOSIS — Z79899 Other long term (current) drug therapy: Secondary | ICD-10-CM | POA: Diagnosis not present

## 2020-11-20 LAB — BASIC METABOLIC PANEL
Anion gap: 9 (ref 5–15)
BUN: 15 mg/dL (ref 8–23)
CO2: 28 mmol/L (ref 22–32)
Calcium: 8.3 mg/dL — ABNORMAL LOW (ref 8.9–10.3)
Chloride: 105 mmol/L (ref 98–111)
Creatinine, Ser: 0.94 mg/dL (ref 0.61–1.24)
GFR, Estimated: >60 mL/min (ref >60)
Glucose, Bld: 132 mg/dL — ABNORMAL HIGH (ref 70–99)
Potassium: 3.5 mmol/L (ref 3.5–5.1)
Sodium: 142 mmol/L (ref 135–145)

## 2020-11-20 LAB — CBC
HCT: 32.1 % — ABNORMAL LOW (ref 39.0–52.0)
Hemoglobin: 10.4 g/dL — ABNORMAL LOW (ref 13.0–17.0)
MCH: 31.4 pg (ref 26.0–34.0)
MCHC: 32.4 g/dL (ref 30.0–36.0)
MCV: 97 fL (ref 80.0–100.0)
Platelets: 228 10*3/uL (ref 150–400)
RBC: 3.31 MIL/uL — ABNORMAL LOW (ref 4.22–5.81)
RDW: 13.4 % (ref 11.5–15.5)
WBC: 10.1 10*3/uL (ref 4.0–10.5)
nRBC: 0 % (ref 0.0–0.2)

## 2020-11-20 LAB — MAGNESIUM: Magnesium: 2.1 mg/dL (ref 1.7–2.4)

## 2020-11-20 LAB — GLUCOSE, CAPILLARY: Glucose-Capillary: 121 mg/dL — ABNORMAL HIGH (ref 70–99)

## 2020-11-20 MED ORDER — AMOXICILLIN-POT CLAVULANATE 875-125 MG PO TABS: 1.0000 | ORAL_TABLET | Freq: Two times a day (BID) | ORAL | Status: DC

## 2020-11-20 MED ORDER — AMOXICILLIN-POT CLAVULANATE 875-125 MG PO TABS: 1.0000 | ORAL_TABLET | Freq: Two times a day (BID) | ORAL | 0 refills | Status: DC

## 2020-11-20 MED ORDER — FUROSEMIDE 80 MG PO TABS: 80.0000 mg | ORAL_TABLET | Freq: Two times a day (BID) | ORAL | Status: DC

## 2020-11-20 NOTE — Discharge Summary (Signed)
Physician Discharge Summary  [name] [id number] DOB: [birth year] DOA: 11/17/2020  PCP: Bonnita Nasuti, MD  Admit date: 11/17/2020 Discharge date: 11/20/2020  Admitted From: ALF Disposition:  ALF  Recommendations for Outpatient Follow-up:  Follow up with PCP in 1-2 weeks Please obtain BMP/CBC in one week Please consult hospice/palliative services to follow patient Please maintain patient on 3L [place] and wean for saturation >92%   Equipment/Devices: 3L [place] Discharge Condition: Stable CODE STATUS: DNR Diet recommendation:Regular   Brief/Interim Summary: 87 y.o. male with medical history significant of hypertension, coronary artery disease, chronic kidney disease stage IIIb, type 2 diabetes, hyperlipidemia, depression/anxiety and BPH; who presented to the emergency department from his skilled nursing facility secondary to productive cough, general malaise and shortness of breath.  Symptoms have been present for the last 3 days or so and worsening according to patient.  At the time the EMS arrived to his facility patient had oxygen saturation of 88-89 on room air.  Patient denies chest pain, no nausea, no vomiting, no hemoptysis, no abdominal pain, no dysuria, no focal weakness, no sick contacts or any other complaints.   Patient Is now vaccinated against COVID; COVID PCR in the emergency department was negative.  Discharge Diagnoses:  1-Severe sepsis secondary to pneumonia (left lower lobe) -present on admission -WBC is trending down and patient reported improvement in his breathing -Still requiring oxygen supplementation and with ongoing intermittent coughing spells. -MRSA PCR negative; vancomycin discontinued. -Continue IV cefepime>>d/c home with amox/clav x 4 more days -Continue as needed antitussive medication, flutter valve, Solu-Cortef and bronchodilators. -discontinued solucortef and remained hemodynamically stable   2-Acute Respiratory failure with hypoxia -stable on  4L >>3L -wean oxygen off for saturation >92%   3-coronary disease/chronic systolic and diastolic CHF -Presented dehydrated on presentation -Tolerated well fluid resuscitation -Continue to follow daily weights and strict I's and O -Continue treatment with aspirin and Plavix-no chest pain. -no signs of fluid overload -restart home dose lasix 7/29   4-hypertension -Blood pressure remains soft>>improve -Continue holding antihypertensive agents during hospitalization -restart metoprolol succinate after d/c   5-restless leg syndrome and hyperlipidemia -Continue statins and Mirapex.   6-depression/anxiety -Continue treatment with Celexa, trazodone and Risperdal -No suicidal ideation or hallucination -Stable mood on exam   7-AKI on chronic kidney disease a stage IIIb -presented with serum creatinine 2.05 -baseline creatinine 1.0-1.3 -due to sepsis and hemodynamic changes -Maintain adequate hydration -Continue to follow renal function trend. -serum creatinine 0.94 on day of dc   7-type 2 diabetes with nephropathy -Continue modified carbohydrate diet -Continue sliding scale insulin -Follow CBGs and adjust hypoglycemic regimen as needed -Continue holding oral diabetes medication while inpatient. -7/25 A1C--9.5 -restart metformin after d/c   Discharge Instructions   Allergies as of 11/20/2020       Reactions   Sulfonamide Derivatives Rash   Tetracycline Rash        Medication List     TAKE these medications    acetaminophen 500 MG tablet Commonly known as: TYLENOL Take 1,000 mg by mouth every 8 (eight) hours as needed.   albuterol 108 (90 Base) MCG/ACT inhaler Commonly known as: VENTOLIN HFA Inhale 2 puffs into the lungs every 4 (four) hours as needed for wheezing or shortness of breath.   amoxicillin-clavulanate 875-125 MG tablet Commonly known as: AUGMENTIN Take 1 tablet by mouth every 12 (twelve) hours. X 4 days   aspirin EC 81 MG tablet Take 1 tablet  (81 mg total) by mouth daily.   atorvastatin 20  MG tablet Commonly known as: LIPITOR Take 20 mg by mouth every morning.   citalopram 20 MG tablet Commonly known as: CELEXA Take 20 mg by mouth daily.   clopidogrel 75 MG tablet Commonly known as: PLAVIX Take 1 tablet (75 mg total) by mouth daily.   fluticasone 50 MCG/ACT nasal spray Commonly known as: FLONASE Place 2 sprays into both nostrils daily.   furosemide 80 MG tablet Commonly known as: LASIX Take 1 tablet (80 mg total) by mouth 2 (two) times daily. Restart 11/21/20 What changed: additional instructions   gabapentin 100 MG capsule Commonly known as: NEURONTIN Take 300 mg by mouth at bedtime.   hydrOXYzine 25 MG tablet Commonly known as: ATARAX/VISTARIL Take 25 mg by mouth every 6 (six) hours as needed for itching.   loratadine 10 MG tablet Commonly known as: CLARITIN Take 10 mg by mouth daily.   magnesium hydroxide 400 MG/5ML suspension Commonly known as: MILK OF MAGNESIA Take 30 mLs by mouth daily as needed for mild constipation.   metFORMIN 500 MG tablet Commonly known as: GLUCOPHAGE Take 500 mg by mouth at bedtime.   metoprolol succinate 25 MG 24 hr tablet Commonly known as: TOPROL-XL Take 25 mg by mouth daily.   multivitamin tablet Take 1 tablet by mouth daily.   nitroGLYCERIN 0.4 MG SL tablet Commonly known as: NITROSTAT Place 0.4 mg under the tongue every 5 (five) minutes x 3 doses as needed for chest pain (if no relief after 2nd dose, proceed to the ED for an evaluation or call 911).   polyethylene glycol 17 g packet Commonly known as: MIRALAX / GLYCOLAX Take 17 g by mouth daily.   potassium chloride SA 20 MEQ tablet Commonly known as: KLOR-CON Take 20 mEq by mouth daily.   pramipexole 0.125 MG tablet Commonly known as: MIRAPEX Take 0.125 mg by mouth at bedtime.   risperiDONE 0.25 MG tablet Commonly known as: RISPERDAL Take 1 tablet (0.25 mg total) by mouth at bedtime.    sennosides-docusate sodium 8.6-50 MG tablet Commonly known as: SENOKOT-S Take 2 tablets by mouth daily as needed for constipation.   simethicone 80 MG chewable tablet Commonly known as: MYLICON Chew 80 mg by mouth every 6 (six) hours as needed for flatulence.   tamsulosin 0.4 MG Caps capsule Commonly known as: FLOMAX Take 0.4 mg by mouth daily after supper.   traZODone 50 MG tablet Commonly known as: DESYREL Take 50 mg by mouth at bedtime.        Contact information for after-discharge care     Destination     [facility] .   Service: Assisted Living Contact information: [place] [address] [phone]                    Allergies  Allergen Reactions   Sulfonamide Derivatives Rash   Tetracycline Rash    Consultations: none   Procedures/Studies: DG Chest Port 1 View  Result Date: 11/17/2020 CLINICAL DATA:  Sepsis with weakness EXAM: PORTABLE CHEST 1 VIEW COMPARISON:  09/29/2019 FINDINGS: Left humeral hardware again seen. 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 Unchanged mild cardiomegaly and mild pulmonary vascular congestion. Unchanged small right pleural effusion with adjacent atelectasis. There is new patchy opacities at the left lung base. IMPRESSION: New patchy opacity at the left lung base may be due to atelectasis or pneumonia. Unchanged small right pleural effusion. Electronically Signed   By: Miachel Roux M.D.   On: 11/17/2020 11:12  Discharge Exam: Vitals:   11/20/20 0732 11/20/20 0734  BP: (!) 141/56   Pulse: 70   Resp: 18   Temp: 98.3 F (36.8 C)   SpO2: 98% 98%   Vitals:   11/19/20 2320 11/20/20 0534 11/20/20 0732 11/20/20 0734  BP: (!) 133/53 (!) 123/50 (!) 141/56   Pulse: 70 63 70   Resp: '20 19 18   '$ Temp: 98.2 F (36.8 C)  98.3 F (36.8 C)   TempSrc: Oral  Oral   SpO2: 96% 93% 98% 98%  Weight:      Height:        General: Pt is alert, awake, not in acute  distress Cardiovascular: RRR, S1/S2 +, no rubs, no gallops Respiratory: bibasilar rales L>R Abdominal: Soft, NT, ND, bowel sounds + Extremities: no edema, no cyanosis   The results of significant diagnostics from this hospitalization (including imaging, microbiology, ancillary and laboratory) are listed below for reference.    Significant Diagnostic Studies: DG Chest Port 1 View  Result Date: 11/17/2020 CLINICAL DATA:  Sepsis with weakness EXAM: PORTABLE CHEST 1 VIEW COMPARISON:  09/29/2019 FINDINGS: Left humeral hardware again seen. 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 Unchanged mild cardiomegaly and mild pulmonary vascular congestion. Unchanged small right pleural effusion with adjacent atelectasis. There is new patchy opacities at the left lung base. IMPRESSION: New patchy opacity at the left lung base may be due to atelectasis or pneumonia. Unchanged small right pleural effusion. Electronically Signed   By: Miachel Roux M.D.   On: 11/17/2020 11:12    Microbiology: Recent Results (from the past 240 hour(s))  Culture, blood (Routine x 2)     Status: None (Preliminary result)   Collection Time: 11/17/20 10:10 AM   Specimen: Right Antecubital; Blood  Result Value Ref Range Status   Specimen Description   Final    RIGHT ANTECUBITAL BOTTLES DRAWN AEROBIC AND ANAEROBIC   Special Requests Blood Culture adequate volume  Final   Culture   Final    NO GROWTH 3 DAYS Performed at [facility], [address], [place], [place] [address]    Report Status PENDING  Incomplete  Culture, blood (Routine x 2)     Status: None (Preliminary result)   Collection Time: 11/17/20 10:24 AM   Specimen: BLOOD RIGHT WRIST  Result Value Ref Range Status   Specimen Description   Final    BLOOD RIGHT WRIST BOTTLES DRAWN AEROBIC AND ANAEROBIC   Special Requests Blood Culture adequate volume  Final   Culture   Final    NO GROWTH 3 DAYS Performed at [facility], [address], [place], [place] [address]     Report Status PENDING  Incomplete  Resp Panel by RT-PCR (Flu A&B, Covid) Nasopharyngeal Swab     Status: None   Collection Time: 11/17/20 11:00 AM   Specimen: Nasopharyngeal Swab; Nasopharyngeal(NP) swabs in vial transport medium  Result Value Ref Range Status   SARS Coronavirus 2 by RT PCR NEGATIVE NEGATIVE Final    Comment: (NOTE) SARS-CoV-2 target nucleic acids are NOT DETECTED.  The SARS-CoV-2 RNA is generally detectable in upper respiratory specimens during the acute phase of infection. The lowest concentration of SARS-CoV-2 viral copies this assay can detect is 138 copies/mL. A negative result does not preclude SARS-Cov-2 infection and should not be used as the sole basis for treatment or other patient management decisions. A negative result may occur with  improper specimen collection/handling, submission of specimen other than nasopharyngeal swab, presence of viral mutation(s) within  the areas targeted by this assay, and inadequate number of viral copies(<138 copies/mL). A negative result must be combined with clinical observations, patient history, and epidemiological information. The expected result is Negative.  Fact Sheet for Patients:  EntrepreneurPulse.com.au  Fact Sheet for Healthcare Providers:  IncredibleEmployment.be  This test is no t yet approved or cleared by the [place] FDA and  has been authorized for detection and/or diagnosis of SARS-CoV-2 by FDA under an Emergency Use Authorization (EUA). This EUA will remain  in effect (meaning this test can be used) for the duration of the COVID-19 declaration under Section 564(b)(1) of the Act, 21 U.S.C.section 360bbb-3(b)(1), unless the authorization is terminated  or revoked sooner.       Influenza A by PCR NEGATIVE NEGATIVE Final   Influenza B by PCR NEGATIVE NEGATIVE Final    Comment: (NOTE) The Xpert Xpress SARS-CoV-2/FLU/RSV plus assay is intended as an aid in the  diagnosis of influenza from Nasopharyngeal swab specimens and should not be used as a sole basis for treatment. Nasal washings and aspirates are unacceptable for Xpert Xpress SARS-CoV-2/FLU/RSV testing.  Fact Sheet for Patients: EntrepreneurPulse.com.au  Fact Sheet for Healthcare Providers: IncredibleEmployment.be  This test is not yet approved or cleared by the [place] FDA and has been authorized for detection and/or diagnosis of SARS-CoV-2 by FDA under an Emergency Use Authorization (EUA). This EUA will remain in effect (meaning this test can be used) for the duration of the COVID-19 declaration under Section 564(b)(1) of the Act, 21 U.S.C. section 360bbb-3(b)(1), unless the authorization is terminated or revoked.  Performed at [facility], [address] [place], [place] [address]   Urine Culture     Status: None   Collection Time: 11/17/20  3:05 PM   Specimen: Urine, Catheterized  Result Value Ref Range Status   Specimen Description   Final    URINE, CATHETERIZED Performed at [facility], [address] [place], [place] [address]    Special Requests   Final    NONE Performed at [facility], [address], [place], [place] [address]    Culture   Final    NO GROWTH Performed at [facility], [place] [address], [place], [place] [address]    Report Status 11/19/2020 FINAL  Final  MRSA Next Gen by PCR, Nasal     Status: None   Collection Time: 11/17/20  3:06 PM   Specimen: Nasal Mucosa; Nasal Swab  Result Value Ref Range Status   MRSA by PCR Next Gen NOT DETECTED NOT DETECTED Final    Comment: (NOTE) The GeneXpert MRSA Assay (FDA approved for NASAL specimens only), is one component of a comprehensive MRSA colonization surveillance program. It is not intended to diagnose MRSA infection nor to guide or monitor treatment for MRSA infections. Test performance is not FDA approved in patients less than 59 years old. Performed at  [facility], [address] [place], [place] [address]   Resp Panel by RT-PCR (Flu A&B, Covid) Nasopharyngeal Swab     Status: None   Collection Time: 11/19/20 11:39 AM   Specimen: Nasopharyngeal Swab; Nasopharyngeal(NP) swabs in vial transport medium  Result Value Ref Range Status   SARS Coronavirus 2 by RT PCR NEGATIVE NEGATIVE Final    Comment: (NOTE) SARS-CoV-2 target nucleic acids are NOT DETECTED.  The SARS-CoV-2 RNA is generally detectable in upper respiratory specimens during the acute phase of infection. The lowest concentration of SARS-CoV-2 viral copies this assay can detect is 138 copies/mL. A  negative result does not preclude SARS-Cov-2 infection and should not be used as the sole basis for treatment or other patient management decisions. A negative result may occur with  improper specimen collection/handling, submission of specimen other than nasopharyngeal swab, presence of viral mutation(s) within the areas targeted by this assay, and inadequate number of viral copies(<138 copies/mL). A negative result must be combined with clinical observations, patient history, and epidemiological information. The expected result is Negative.  Fact Sheet for Patients:  EntrepreneurPulse.com.au  Fact Sheet for Healthcare Providers:  IncredibleEmployment.be  This test is no t yet approved or cleared by the [place] FDA and  has been authorized for detection and/or diagnosis of SARS-CoV-2 by FDA under an Emergency Use Authorization (EUA). This EUA will remain  in effect (meaning this test can be used) for the duration of the COVID-19 declaration under Section 564(b)(1) of the Act, 21 U.S.C.section 360bbb-3(b)(1), unless the authorization is terminated  or revoked sooner.       Influenza A by PCR NEGATIVE NEGATIVE Final   Influenza B by PCR NEGATIVE NEGATIVE Final    Comment: (NOTE) The Xpert Xpress SARS-CoV-2/FLU/RSV plus assay is  intended as an aid in the diagnosis of influenza from Nasopharyngeal swab specimens and should not be used as a sole basis for treatment. Nasal washings and aspirates are unacceptable for Xpert Xpress SARS-CoV-2/FLU/RSV testing.  Fact Sheet for Patients: EntrepreneurPulse.com.au  Fact Sheet for Healthcare Providers: IncredibleEmployment.be  This test is not yet approved or cleared by the [place] FDA and has been authorized for detection and/or diagnosis of SARS-CoV-2 by FDA under an Emergency Use Authorization (EUA). This EUA will remain in effect (meaning this test can be used) for the duration of the COVID-19 declaration under Section 564(b)(1) of the Act, 21 U.S.C. section 360bbb-3(b)(1), unless the authorization is terminated or revoked.  Performed at [facility], [address], [place], [place] [address]      Labs: Basic Metabolic Panel: Recent Labs  Lab 11/17/20 1009 11/18/20 0441 11/19/20 1206 11/20/20 0408  NA 133* 138 139 142  K 4.4 4.1 4.0 3.5  CL 94* 101 105 105  CO2 '27 29 27 28  '$ GLUCOSE 292* 201* 195* 132*  BUN 45* 34* 20 15  CREATININE 2.03* 1.34* 1.02 0.94  CALCIUM 8.9 8.5* 8.3* 8.3*  MG 2.0  --  2.0 2.1  PHOS 4.2  --   --   --    Liver Function Tests: Recent Labs  Lab 11/17/20 1009  AST 20  ALT 15  ALKPHOS 63  BILITOT 1.5*  PROT 7.8  ALBUMIN 3.9   No results for input(s): LIPASE, AMYLASE in the last 168 hours. No results for input(s): AMMONIA in the last 168 hours. CBC: Recent Labs  Lab 11/17/20 1009 11/18/20 0441 11/19/20 1206 11/20/20 0408  WBC 26.7* 18.3* 13.1* 10.1  NEUTROABS 24.5*  --   --   --   HGB 12.1* 9.4* 10.5* 10.4*  HCT 36.2* 29.5* 32.7* 32.1*  MCV 94.5 97.4 98.5 97.0  PLT 260 214 231 228   Cardiac Enzymes: No results for input(s): CKTOTAL, CKMB, CKMBINDEX, TROPONINI in the last 168 hours. BNP: Invalid input(s): POCBNP CBG: Recent Labs  Lab 11/19/20 0802 11/19/20 1132  11/19/20 1646 11/19/20 2158 11/20/20 0730  GLUCAP 151* 175* 147* 110* 121*    Time coordinating discharge:  36 minutes  Signed:  Orson Eva, DO [facility] Pager: [phone] 11/20/2020, 9:01 AM

## 2020-11-20 NOTE — NC FL2 (Signed)
[place] LEVEL OF CARE SCREENING TOOL     IDENTIFICATION  Patient Name: [name] Birthdate: Jul 05, 1926 Sex: male Admission Date (Current Location): 11/17/2020  [facility] and [place] Number:  [employer] and Address:  [facility] [address], [place]      Provider Number: [id number]  Attending Physician Name and Address:  Orson Eva, MD  Relative Name and Phone Number:  Neil Soun [phone]    Current Level of Care: Hospital Recommended Level of Care: [place] Prior Approval Number:    Date Approved/Denied:   PASRR Number: [id number]  Discharge Plan: Domiciliary (Rest home)    Current Diagnoses: Patient Active Problem List   Diagnosis Date Noted   Lobar pneumonia ([place]) 11/19/2020   Acute respiratory failure with hypoxia ([place]) 11/19/2020   Acute renal failure superimposed on stage 3b chronic kidney disease ([place]) 11/19/2020   Sepsis ([place]) 11/17/2020   Diarrhea    Falls frequently 09/26/2019   CKD (chronic kidney disease), stage III ([place]) 02/16/2019   DOE (dyspnea on exertion) 02/15/2019   Mitral regurgitation    Aortic insufficiency    Right knee dislocation 05/26/2011   Knee dislocation 04/03/2011   Carotid artery disease (HCC)    Ejection fraction    SOB (shortness of breath)    Dizziness    Hx of CABG    Dyslipidemia    Hypertension    CAD (coronary artery disease) of artery bypass graft    TIA (transient ischemic attack)    Bradycardia    Diabetes mellitus type 2 in nonobese ([place]) 12/20/2008   COLITIS, HX OF 12/20/2008   NEPHROLITHIASIS, HX OF 12/20/2008   ASBESTOS EXPOSURE, HX OF 12/20/2008    Orientation RESPIRATION BLADDER Height & Weight     Self, Place  O2 (4L) External catheter Weight: 88.9 kg Height:  '5\' 11"'$  (180.3 cm)  BEHAVIORAL SYMPTOMS/MOOD NEUROLOGICAL BOWEL NUTRITION STATUS      Incontinent Diet (DYS 3)  AMBULATORY STATUS COMMUNICATION OF NEEDS Skin    Extensive Assist Verbally Normal                       Personal Care Assistance Level of Assistance  Bathing, Feeding, Dressing Bathing Assistance: Limited assistance Feeding assistance: Limited assistance Dressing Assistance: Limited assistance     Functional Limitations Info  Sight, Hearing, Speech Sight Info: Impaired Hearing Info: Adequate Speech Info: Adequate    SPECIAL CARE FACTORS FREQUENCY                       Contractures Contractures Info: Not present    Additional Factors Info  Code Status, Allergies Code Status Info: DNR Allergies Info: Sulfa, tetracycline           Current Medications (11/20/2020):  This is the current hospital active medication list Current Facility-Administered Medications  Medication Dose Route Frequency Provider Last Rate Last Admin   acetaminophen (TYLENOL) tablet 650 mg  650 mg Oral Q6H PRN Tat, Shanon Brow, MD   650 mg at 11/20/20 0359   Or   acetaminophen (TYLENOL) suppository 650 mg  650 mg Rectal Q6H PRN Tat, Shanon Brow, MD       amoxicillin-clavulanate (AUGMENTIN) 875-125 MG per tablet 1 tablet  1 tablet Oral Q12H Orson Eva, MD       aspirin EC tablet 81 mg  81 mg Oral Daily Tat, Shanon Brow, MD   81 mg at 11/20/20 [id number]  atorvastatin (LIPITOR) tablet 20 mg  20 mg Oral q morning Tat, David, MD   20 mg at 11/20/20 0856   ceFEPIme (MAXIPIME) 2 g in sodium chloride 0.9 % 100 mL IVPB  2 g Intravenous Therisa Doyne, MD 200 mL/hr at 11/20/20 0859 2 g at 11/20/20 0859   Chlorhexidine Gluconate Cloth 2 % PADS 6 each  6 each Topical Daily Tat, David, MD   6 each at 11/20/20 0900   citalopram (CELEXA) tablet 20 mg  20 mg Oral Daily Tat, Shanon Brow, MD   20 mg at 11/20/20 0856   clopidogrel (PLAVIX) tablet 75 mg  75 mg Oral Daily Tat, Shanon Brow, MD   75 mg at 11/20/20 0856   fluticasone (FLONASE) 50 MCG/ACT nasal spray 2 spray  2 spray Each Nare Daily Tat, David, MD   2 spray at 11/20/20 0900   gabapentin (NEURONTIN) capsule 300 mg  300 mg Oral Benay Pike, MD   300 mg at 11/19/20 2137   heparin injection 5,000 Units  5,000 Units Subcutaneous Franco Collet, MD   5,000 Units at 11/20/20 [id number]   hydrOXYzine (ATARAX/VISTARIL) tablet 25 mg  25 mg Oral Q6H PRN Orson Eva, MD   25 mg at 11/18/20 2007   insulin aspart (novoLOG) injection 0-5 Units  0-5 Units Subcutaneous QHS Orson Eva, MD   2 Units at 11/18/20 2142   insulin aspart (novoLOG) injection 0-9 Units  0-9 Units Subcutaneous TID WC Tat, Shanon Brow, MD   1 Units at 11/20/20 0900   ipratropium-albuterol (DUONEB) 0.5-2.5 (3) MG/3ML nebulizer solution 3 mL  3 mL Nebulization TID Orson Eva, MD   3 mL at 11/20/20 0734   loratadine (CLARITIN) tablet 10 mg  10 mg Oral Daily Tat, Shanon Brow, MD   10 mg at 11/20/20 0856   MEDLINE mouth rinse  15 mL Mouth Rinse BID Tat, David, MD   15 mL at 11/20/20 0859   methocarbamol (ROBAXIN) tablet 500 mg  500 mg Oral Q8H PRN Tat, Shanon Brow, MD   500 mg at 11/19/20 1524   ondansetron (ZOFRAN) tablet 4 mg  4 mg Oral Q6H PRN Tat, Shanon Brow, MD       Or   ondansetron [facility]) injection 4 mg  4 mg Intravenous Q6H PRN Tat, Shanon Brow, MD       pramipexole (MIRAPEX) tablet 0.125 mg  0.125 mg Oral QHS Orson Eva, MD   0.125 mg at 11/19/20 2201   risperiDONE (RISPERDAL) tablet 0.25 mg  0.25 mg Oral Benay Pike, MD   0.25 mg at 11/19/20 2137   tamsulosin (FLOMAX) capsule 0.4 mg  0.4 mg Oral QPC supper Tat, Shanon Brow, MD   0.4 mg at 11/19/20 1702   traZODone (DESYREL) tablet 50 mg  50 mg Oral Benay Pike, MD   50 mg at 11/19/20 2137     Discharge Medications: Medication List       TAKE these medications     acetaminophen 500 MG tablet Commonly known as: TYLENOL Take 1,000 mg by mouth every 8 (eight) hours as needed.    albuterol 108 (90 Base) MCG/ACT inhaler Commonly known as: VENTOLIN HFA Inhale 2 puffs into the lungs every 4 (four) hours as needed for wheezing or shortness of breath.    amoxicillin-clavulanate 875-125 MG tablet Commonly known as: AUGMENTIN Take 1  tablet by mouth every 12 (twelve) hours. X 4 days    aspirin EC 81 MG tablet Take 1 tablet (81 mg total) by mouth daily.  atorvastatin 20 MG tablet Commonly known as: LIPITOR Take 20 mg by mouth every morning.    citalopram 20 MG tablet Commonly known as: CELEXA Take 20 mg by mouth daily.    clopidogrel 75 MG tablet Commonly known as: PLAVIX Take 1 tablet (75 mg total) by mouth daily.    fluticasone 50 MCG/ACT nasal spray Commonly known as: FLONASE Place 2 sprays into both nostrils daily.    furosemide 80 MG tablet Commonly known as: LASIX Take 1 tablet (80 mg total) by mouth 2 (two) times daily. Restart 11/21/20 What changed: additional instructions    gabapentin 100 MG capsule Commonly known as: NEURONTIN Take 300 mg by mouth at bedtime.    hydrOXYzine 25 MG tablet Commonly known as: ATARAX/VISTARIL Take 25 mg by mouth every 6 (six) hours as needed for itching.    loratadine 10 MG tablet Commonly known as: CLARITIN Take 10 mg by mouth daily.    magnesium hydroxide 400 MG/5ML suspension Commonly known as: MILK OF MAGNESIA Take 30 mLs by mouth daily as needed for mild constipation.    metFORMIN 500 MG tablet Commonly known as: GLUCOPHAGE Take 500 mg by mouth at bedtime.    metoprolol succinate 25 MG 24 hr tablet Commonly known as: TOPROL-XL Take 25 mg by mouth daily.    multivitamin tablet Take 1 tablet by mouth daily.    nitroGLYCERIN 0.4 MG SL tablet Commonly known as: NITROSTAT Place 0.4 mg under the tongue every 5 (five) minutes x 3 doses as needed for chest pain (if no relief after 2nd dose, proceed to the ED for an evaluation or call 911).    polyethylene glycol 17 g packet Commonly known as: MIRALAX / GLYCOLAX Take 17 g by mouth daily.    potassium chloride SA 20 MEQ tablet Commonly known as: KLOR-CON Take 20 mEq by mouth daily.    pramipexole 0.125 MG tablet Commonly known as: MIRAPEX Take 0.125 mg by mouth at bedtime.    risperiDONE 0.25  MG tablet Commonly known as: RISPERDAL Take 1 tablet (0.25 mg total) by mouth at bedtime.    sennosides-docusate sodium 8.6-50 MG tablet Commonly known as: SENOKOT-S Take 2 tablets by mouth daily as needed for constipation.    simethicone 80 MG chewable tablet Commonly known as: MYLICON Chew 80 mg by mouth every 6 (six) hours as needed for flatulence.    tamsulosin 0.4 MG Caps capsule Commonly known as: FLOMAX Take 0.4 mg by mouth daily after supper.    traZODone 50 MG tablet Commonly known as: DESYREL Take 50 mg by mouth at bedtime.     Relevant Imaging Results:  Relevant Lab Results:   Additional Information SS# [id number]  Referred to [facility]  [place], [place], [place]

## 2020-11-20 NOTE — TOC Transition Note (Signed)
Transition of Care [facility]) - CM/SW Discharge Note   Patient Details  Name: [name] MRN: [id number] Date of Birth: Jun 02, 1926  Transition of Care [facility]) CM/SW Contact:  Boneta Lucks, RN Phone Number: 11/20/2020, 10:07 AM   Clinical Narrative:   Patient is medically ready to return to [facility]. TOC updated his daughter and Larey Seat at [facility].  Faxed FL2 and COVID test to [place] at [facility], [place] to update facility, patient is on oxygen. Send DC summary in hub. Med necessity printed.   Final next level of care: Assisted Living Barriers to Discharge: Barriers Resolved   Patient Goals and CMS Choice Patient states their goals for this hospitalization and ongoing recovery are:: to go back to ALF. CMS Medicare.gov Compare Post Acute Care list provided to:: Patient Represenative (must comment) Choice offered to / list presented to : Adult Children  Discharge Placement               Patient to be transferred to facility by: EMS Name of family member notified: Billie Patient and family notified of of transfer: 11/20/20  Discharge Plan and Services      [place] Date [place]: 11/18/20 Time [place]: 1600 Representative spoke with at [place]: [place]  Readmission Risk Interventions Readmission Risk Prevention Plan 11/20/2020 11/18/2020  Transportation Screening Complete Complete  PCP or Specialist Appt within 5-7 Days Complete -  Home Care Screening Complete -  Medication Review (RN CM) Complete -  HRI or [place] - Complete  Social Work Consult for [place] Planning/Counseling - Complete  Palliative Care Screening - Complete  Medication Review [employer]) - Complete  Some recent data might be hidden

## 2020-11-22 LAB — CULTURE, BLOOD (ROUTINE X 2)
Culture: NO GROWTH
Culture: NO GROWTH
Special Requests: ADEQUATE
Special Requests: ADEQUATE

## 2020-12-04 DIAGNOSIS — E038 Other specified hypothyroidism: Secondary | ICD-10-CM | POA: Diagnosis not present

## 2020-12-04 DIAGNOSIS — M1990 Unspecified osteoarthritis, unspecified site: Secondary | ICD-10-CM | POA: Diagnosis not present

## 2020-12-04 DIAGNOSIS — I2510 Atherosclerotic heart disease of native coronary artery without angina pectoris: Secondary | ICD-10-CM | POA: Diagnosis not present

## 2020-12-04 DIAGNOSIS — E559 Vitamin D deficiency, unspecified: Secondary | ICD-10-CM | POA: Diagnosis not present

## 2020-12-04 DIAGNOSIS — D518 Other vitamin B12 deficiency anemias: Secondary | ICD-10-CM | POA: Diagnosis not present

## 2020-12-04 DIAGNOSIS — F0391 Unspecified dementia with behavioral disturbance: Secondary | ICD-10-CM | POA: Diagnosis not present

## 2020-12-04 DIAGNOSIS — E785 Hyperlipidemia, unspecified: Secondary | ICD-10-CM | POA: Diagnosis not present

## 2020-12-04 DIAGNOSIS — I509 Heart failure, unspecified: Secondary | ICD-10-CM | POA: Diagnosis not present

## 2020-12-04 DIAGNOSIS — N400 Enlarged prostate without lower urinary tract symptoms: Secondary | ICD-10-CM | POA: Diagnosis not present

## 2020-12-04 DIAGNOSIS — I10 Essential (primary) hypertension: Secondary | ICD-10-CM | POA: Diagnosis not present

## 2020-12-04 DIAGNOSIS — E119 Type 2 diabetes mellitus without complications: Secondary | ICD-10-CM | POA: Diagnosis not present

## 2020-12-04 DIAGNOSIS — F329 Major depressive disorder, single episode, unspecified: Secondary | ICD-10-CM | POA: Diagnosis not present

## 2020-12-12 DIAGNOSIS — I10 Essential (primary) hypertension: Secondary | ICD-10-CM | POA: Diagnosis not present

## 2020-12-13 NOTE — Progress Notes (Signed)
Cardiology Office Note  Date: 12/15/2020   ID: Jesse, Alvarez [birth year], MRN [id number]  PCP:  Bonnita Nasuti, MD  Cardiologist:  None Electrophysiologist:  None   Chief Complaint: 2 month follow up  History of Present Illness: [name] is a 87 y.o. male with a history of aortic insufficiency, bradycardia, CAD, DM2, HTN, carotid artery disease, HLD, PVD.  He was last seen by Dr. Harl Bowie on 10/13/2020 for focused visit for combined systolic and diastolic heart failure.  Previous echocardiogram February 2020 demonstrated LVEF of 35 to 40%.  Grade 1 DD.  Previous visit reported worsening shortness of breath and mild edema with some orthopnea.  His Lasix was increased to 80 mg p.o. twice daily and was to have repeat labs.  His weight was down from 194 to 188 pounds.  Symptoms were improving.  Plans were to continue current medication regimen and follow-up labs.  He had a recent admission with presentation for productive cough, general malaise and shortness of breath on 11/17/2020.  He had been having the symptoms for the prior 3 days.  His diagnosis was severe sepsis secondary to pneumonia, acute respiratory failure with AKI-with serum creatinine of 2.05.  Baseline creatinine of 1.0-1.3.  He was discharged on 11/20/2020  He is here for follow-up today.  States he is doing reasonably well.  He states the caregivers at the assisted living facility keep trying to put his nasal cannula O2 on him which irritates his nose.  He is frustrated with the fact they are always trying to keep the oxygen on him.  He does not feel as though he needs it all the time.  He denies any current DOE or SOB.  Denies any orthostatic symptoms, CVA or TIA-like symptoms, palpitations or arrhythmias, PND, orthopnea.  No bleeding issues.  No claudication-like symptoms.  He states he may notice some minor improvement and his breathing and lower extremity edema since increasing the Lasix.  His weight today is 185,  which is down from previous check on October 13, 2020.  He is sitting in a wheelchair today accompanied by an employee of [employer] care.  His current cardiac regimen includes aspirin 81 mg daily, atorvastatin 20 mg daily, Plavix 75 mg daily, Lasix 80 mg p.o. twice daily.  Toprol-XL 25 mg p.o. twice daily, sublingual nitroglycerin as needed, potassium supplementation 20 mEq daily,  Past Medical History:  Diagnosis Date   Aortic insufficiency    a. previously mild in 2012. b. trivial seen on echo in 05/2018.   Asbestos exposure    Bradycardia    MILD   CAD (coronary artery disease) 2003   a. s/p CABG in 1994. b. Cath 2003 patent grafts. c. DOE in 2020 felt anginal equivalent - cath 01/2019 s/p PCI/DES to the SVG-PDA.   Cancer [facility])    Skin   Carotid artery disease ([place]) 2001   BILATERAL 0-39%/  Follow with Dopp[facility] by Dr. Woody Seller   Chronic combined systolic and diastolic CHF (congestive heart failure) ([place])    CKD (chronic kidney disease), stage III ([place])    Colitis    Diabetes mellitus    Dilated aortic root ([place]) 05/2018   Dizziness    He has had falls related to this / Chronic dizziness   Dyslipidemia    Hx of CABG    Hypertension    Nephrolithiasis    PVD (peripheral vascular disease) (HCC)    Renal cyst    TIA (transient ischemic attack)  2007   ?? medical Rx     Past Surgical History:  Procedure Laterality Date   CORONARY ARTERY BYPASS GRAFT     CORONARY STENT INTERVENTION N/A 02/15/2019   Procedure: CORONARY STENT INTERVENTION;  Surgeon: Jettie Booze, MD;  Location: [facility];  Service: Cardiovascular;  Laterality: N/A;  SEQ SVG PDA-PL   LEFT HEART CATH AND CORS/GRAFTS ANGIOGRAPHY N/A 02/15/2019   Procedure: LEFT HEART CATH AND CORS/GRAFTS ANGIOGRAPHY;  Surgeon: Jettie Booze, MD;  Location: [place] [facility];  Service: Cardiovascular;  Laterality: N/A;   percutaneous transluminal coronary angioplasty     hx    Current Outpatient Medications   Medication Sig Dispense Refill   acetaminophen (TYLENOL) 500 MG tablet Take 1,000 mg by mouth every 8 (eight) hours as needed.     albuterol (VENTOLIN HFA) 108 (90 Base) MCG/ACT inhaler Inhale 2 puffs into the lungs every 4 (four) hours as needed for wheezing or shortness of breath.     aspirin EC 81 MG tablet Take 1 tablet (81 mg total) by mouth daily.     atorvastatin (LIPITOR) 20 MG tablet Take 20 mg by mouth every morning.   4   citalopram (CELEXA) 20 MG tablet Take 20 mg by mouth daily.     clopidogrel (PLAVIX) 75 MG tablet Take 1 tablet (75 mg total) by mouth daily. 90 tablet 3   fluticasone (FLONASE) 50 MCG/ACT nasal spray Place 2 sprays into both nostrils daily.     furosemide (LASIX) 40 MG tablet Take 40 mg by mouth 2 (two) times daily.     HYDROcodone-acetaminophen (NORCO/VICODIN) 5-325 MG tablet Take 1 tablet by mouth every 4 (four) hours as needed for moderate pain.     hydrOXYzine (ATARAX/VISTARIL) 25 MG tablet Take 25 mg by mouth every 6 (six) hours as needed for itching.     loratadine (CLARITIN) 10 MG tablet Take 10 mg by mouth daily.     magnesium hydroxide (MILK OF MAGNESIA) 400 MG/5ML suspension Take 30 mLs by mouth daily as needed for mild constipation.     metFORMIN (GLUCOPHAGE) 500 MG tablet Take 500 mg by mouth at bedtime.      metoprolol succinate (TOPROL-XL) 25 MG 24 hr tablet Take 25 mg by mouth daily.     Multiple Vitamin (MULTIVITAMIN) tablet Take 1 tablet by mouth daily.     nitroGLYCERIN (NITROSTAT) 0.4 MG SL tablet Place 0.4 mg under the tongue every 5 (five) minutes x 3 doses as needed for chest pain (if no relief after 2nd dose, proceed to the ED for an evaluation or call 911).     polyethylene glycol (MIRALAX / GLYCOLAX) 17 g packet Take 17 g by mouth daily.     potassium chloride SA (KLOR-CON) 20 MEQ tablet Take 20 mEq by mouth daily.     pramipexole (MIRAPEX) 0.125 MG tablet Take 0.125 mg by mouth at bedtime.     risperiDONE (RISPERDAL) 0.25 MG tablet Take 1  tablet (0.25 mg total) by mouth at bedtime.     sennosides-docusate sodium (SENOKOT-S) 8.6-50 MG tablet Take 2 tablets by mouth daily as needed for constipation.     simethicone (MYLICON) 80 MG chewable tablet Chew 80 mg by mouth every 6 (six) hours as needed for flatulence.     tamsulosin (FLOMAX) 0.4 MG CAPS capsule Take 0.4 mg by mouth daily after supper.     traZODone (DESYREL) 50 MG tablet Take 50 mg by mouth at bedtime.  No current facility-administered medications for this visit.   Allergies:  Sulfonamide derivatives and Tetracycline   Social History: The patient  reports that he quit smoking about 40 years ago. His smoking use included cigarettes. He started smoking about 74 years ago. He has a 60.00 pack-year smoking history. He has never used smokeless tobacco. He reports that he does not drink alcohol and does not use drugs.   Family History: The patient's family history includes Heart attack (age of onset: 70) in his mother.   ROS:  Please see the history of present illness. Otherwise, complete review of systems is positive for none.  All other systems are reviewed and negative.   Physical Exam: VS:  BP 124/82   Pulse 75   Ht '5\' 11"'$  (1.803 m)   Wt 185 lb 12.8 oz (84.3 kg)   SpO2 94%   BMI 25.91 kg/m , BMI Body mass index is 25.91 kg/m.  Wt Readings from Last 3 Encounters:  12/15/20 185 lb 12.8 oz (84.3 kg)  11/18/20 195 lb 15.8 oz (88.9 kg)  10/13/20 188 lb 3.2 oz (85.4 kg)    General: Patient appears comfortable at rest. Neck: Supple, no elevated JVP or carotid bruits, no thyromegaly. Lungs: Clear to auscultation, nonlabored breathing at rest. Cardiac: Regular rate and rhythm, no S3 or significant systolic murmur, no pericardial rub. Extremities: No pitting edema, distal pulses 2+. Skin: Warm and dry. Musculoskeletal: No kyphosis. Neuropsychiatric: Alert and oriented x3, affect grossly appropriate.  ECG:  E[facility] at [facility], ED on 11/17/2020 sinus rhythm with a  rate of 96, incomplete LBBB, minimal ST elevation in inferior leads.  Recent Labwork: 11/17/2020: ALT 15; AST 20 11/20/2020: BUN 15; Creatinine, Ser 0.94; Hemoglobin 10.4; Magnesium 2.1; Platelets 228; Potassium 3.5; Sodium 142  No results found for: CHOL, TRIG, HDL, CHOLHDL, VLDL, LDLCALC, LDLDIRECT  Other Studies Reviewed Today:  Diagnostic Studies Echocardiogram: 05/2018 IMPRESSIONS    1. The left ventricle has moderately reduced systolic function, with an ejection fraction of 35-40%. The cavity size was normal. There is mildly increased left ventricular wall thickness. Left ventricular diastolic Doppler parameters are consistent with  impaired relaxation Indeterminent filling pressures Left ventricular diffuse hypokinesis.  2. Left atrial size was mildly dilated.  3. The mitral valve is normal in structure. There is mild mitral annular calcification present.  4. The tricuspid valve is normal in structure.  5. The aortic valve is tricuspid There is mild thickening and sclerosis without any evidence of stenosis of the aortic valve. Aortic valve regurgitation is trivial by color flow Doppler.  6. There is mild dilatation of the aortic root.  7. Right atrial pressure is estimated at 10 mmHg.  8. The interatrial septum was not well visualized.     Cardiac Catheterization: 02/15/2019 Prox RCA lesion is 75% stenosed. RPDA lesion is 99% stenosed. SVG to PDA is patent with stenosis and was treated. Ost LAD to Prox LAD lesion is 95% stenosed. 2nd Mrg lesion is 100% stenosed. Mid LAD lesion is 100% stenosed. Jump graft LIMA to diag/LAD is widely patent. SVG to PDA Origin to Prox Graft lesion before RPDA is 80% stenosed. A drug-eluting stent was successfully placed using a STENT SYNERGY DES 3.5X20. Post intervention, there is a 5% residual stenosis. Jump graft between RPDA and RPAV is 100% stenosed. LV end diastolic pressure is moderately elevated. There is no aortic valve stenosis.    Severe three vessel disease.  Patent grafts to OM, LAD and diagonal.  PCI to SVG  to PDA.     Continue DAPT with aggressive secondary prevention.  Watch overnight on telemetry.  Likely discharge tomorrow.   Minimal fluids post cath due to LV dysfunction and moderately elevated LVEDP.   Hold ACE-I until creatinine rechecked.        Assessment and Plan:  1. Acute on chronic systolic (congestive) heart failure (HCC)   Denies any significant shortness of breath or DOE.  Weight is down to 185.  No evidence of lower extremity edema.  Continue Lasix 80 mg p.o. twice daily.  Continue Toprol-XL 25 mg daily, Labs on [id number] basic metabolic panel showed elevated glucose of 132.  Creatinine 0.94, GFR greater than 60.    Medication Adjustments/Labs and Tests Ordered: Current medicines are reviewed at length with the patient today.  Concerns regarding medicines are outlined above.   Disposition: Follow-up with Dr. Harl Bowie or APP 6 months.  Signed, Levell July, NP 12/15/2020 10:39 AM    [facility], [place], [place] [address] Phone: [phone]; Fax: [phone]

## 2020-12-15 ENCOUNTER — Ambulatory Visit: Payer: Medicare Other | Admitting: Family Medicine

## 2020-12-15 ENCOUNTER — Encounter: Payer: Self-pay | Admitting: Family Medicine

## 2020-12-15 ENCOUNTER — Ambulatory Visit (INDEPENDENT_AMBULATORY_CARE_PROVIDER_SITE_OTHER): Payer: Medicare Other | Admitting: Family Medicine

## 2020-12-15 VITALS — BP 124/82 | HR 75 | Ht 71.0 in | Wt 185.8 lb

## 2020-12-15 DIAGNOSIS — I5023 Acute on chronic systolic (congestive) heart failure: Secondary | ICD-10-CM | POA: Diagnosis not present

## 2020-12-15 NOTE — Patient Instructions (Signed)
Medication Instructions:  Continue all current medications.   Labwork: none  Testing/Procedures: none  Follow-Up: 6 months   Any Other Special Instructions Will Be Listed Below (If Applicable).   If you need a refill on your cardiac medications before your next appointment, please call your pharmacy.  

## 2020-12-17 DIAGNOSIS — F4320 Adjustment disorder, unspecified: Secondary | ICD-10-CM | POA: Diagnosis not present

## 2020-12-18 DIAGNOSIS — J9610 Chronic respiratory failure, unspecified whether with hypoxia or hypercapnia: Secondary | ICD-10-CM | POA: Diagnosis not present

## 2020-12-18 DIAGNOSIS — F5101 Primary insomnia: Secondary | ICD-10-CM | POA: Diagnosis not present

## 2020-12-18 DIAGNOSIS — I5040 Unspecified combined systolic (congestive) and diastolic (congestive) heart failure: Secondary | ICD-10-CM | POA: Diagnosis not present

## 2020-12-18 DIAGNOSIS — F0391 Unspecified dementia with behavioral disturbance: Secondary | ICD-10-CM | POA: Diagnosis not present

## 2020-12-18 DIAGNOSIS — F419 Anxiety disorder, unspecified: Secondary | ICD-10-CM | POA: Diagnosis not present

## 2020-12-18 DIAGNOSIS — R451 Restlessness and agitation: Secondary | ICD-10-CM | POA: Diagnosis not present

## 2020-12-18 DIAGNOSIS — M6281 Muscle weakness (generalized): Secondary | ICD-10-CM | POA: Diagnosis not present

## 2020-12-18 DIAGNOSIS — R238 Other skin changes: Secondary | ICD-10-CM | POA: Diagnosis not present

## 2020-12-31 DIAGNOSIS — F4320 Adjustment disorder, unspecified: Secondary | ICD-10-CM | POA: Diagnosis not present

## 2020-12-31 DIAGNOSIS — F419 Anxiety disorder, unspecified: Secondary | ICD-10-CM | POA: Diagnosis not present

## 2021-01-01 DIAGNOSIS — F0391 Unspecified dementia with behavioral disturbance: Secondary | ICD-10-CM | POA: Diagnosis not present

## 2021-01-01 DIAGNOSIS — F419 Anxiety disorder, unspecified: Secondary | ICD-10-CM | POA: Diagnosis not present

## 2021-01-01 DIAGNOSIS — F5101 Primary insomnia: Secondary | ICD-10-CM | POA: Diagnosis not present

## 2021-01-09 DIAGNOSIS — N400 Enlarged prostate without lower urinary tract symptoms: Secondary | ICD-10-CM | POA: Diagnosis not present

## 2021-01-09 DIAGNOSIS — I2510 Atherosclerotic heart disease of native coronary artery without angina pectoris: Secondary | ICD-10-CM | POA: Diagnosis not present

## 2021-01-09 DIAGNOSIS — F329 Major depressive disorder, single episode, unspecified: Secondary | ICD-10-CM | POA: Diagnosis not present

## 2021-01-09 DIAGNOSIS — F0391 Unspecified dementia with behavioral disturbance: Secondary | ICD-10-CM | POA: Diagnosis not present

## 2021-01-09 DIAGNOSIS — E785 Hyperlipidemia, unspecified: Secondary | ICD-10-CM | POA: Diagnosis not present

## 2021-01-09 DIAGNOSIS — I10 Essential (primary) hypertension: Secondary | ICD-10-CM | POA: Diagnosis not present

## 2021-01-09 DIAGNOSIS — M1990 Unspecified osteoarthritis, unspecified site: Secondary | ICD-10-CM | POA: Diagnosis not present

## 2021-01-09 DIAGNOSIS — D518 Other vitamin B12 deficiency anemias: Secondary | ICD-10-CM | POA: Diagnosis not present

## 2021-01-09 DIAGNOSIS — I509 Heart failure, unspecified: Secondary | ICD-10-CM | POA: Diagnosis not present

## 2021-01-09 DIAGNOSIS — E119 Type 2 diabetes mellitus without complications: Secondary | ICD-10-CM | POA: Diagnosis not present

## 2021-01-09 DIAGNOSIS — E559 Vitamin D deficiency, unspecified: Secondary | ICD-10-CM | POA: Diagnosis not present

## 2021-01-09 DIAGNOSIS — E038 Other specified hypothyroidism: Secondary | ICD-10-CM | POA: Diagnosis not present

## 2021-01-15 DIAGNOSIS — J9620 Acute and chronic respiratory failure, unspecified whether with hypoxia or hypercapnia: Secondary | ICD-10-CM | POA: Diagnosis not present

## 2021-01-15 DIAGNOSIS — M1990 Unspecified osteoarthritis, unspecified site: Secondary | ICD-10-CM | POA: Diagnosis not present

## 2021-01-15 DIAGNOSIS — N400 Enlarged prostate without lower urinary tract symptoms: Secondary | ICD-10-CM | POA: Diagnosis not present

## 2021-01-15 DIAGNOSIS — E119 Type 2 diabetes mellitus without complications: Secondary | ICD-10-CM | POA: Diagnosis not present

## 2021-01-15 DIAGNOSIS — R451 Restlessness and agitation: Secondary | ICD-10-CM | POA: Diagnosis not present

## 2021-01-15 DIAGNOSIS — F0391 Unspecified dementia with behavioral disturbance: Secondary | ICD-10-CM | POA: Diagnosis not present

## 2021-01-15 DIAGNOSIS — I5040 Unspecified combined systolic (congestive) and diastolic (congestive) heart failure: Secondary | ICD-10-CM | POA: Diagnosis not present

## 2021-01-15 DIAGNOSIS — N189 Chronic kidney disease, unspecified: Secondary | ICD-10-CM | POA: Diagnosis not present

## 2021-01-15 DIAGNOSIS — F419 Anxiety disorder, unspecified: Secondary | ICD-10-CM | POA: Diagnosis not present

## 2021-01-15 DIAGNOSIS — F5101 Primary insomnia: Secondary | ICD-10-CM | POA: Diagnosis not present

## 2021-01-21 DIAGNOSIS — F4320 Adjustment disorder, unspecified: Secondary | ICD-10-CM | POA: Diagnosis not present

## 2021-01-21 DIAGNOSIS — F419 Anxiety disorder, unspecified: Secondary | ICD-10-CM | POA: Diagnosis not present

## 2021-02-03 DIAGNOSIS — E7849 Other hyperlipidemia: Secondary | ICD-10-CM | POA: Diagnosis not present

## 2021-02-03 DIAGNOSIS — D518 Other vitamin B12 deficiency anemias: Secondary | ICD-10-CM | POA: Diagnosis not present

## 2021-02-03 DIAGNOSIS — E559 Vitamin D deficiency, unspecified: Secondary | ICD-10-CM | POA: Diagnosis not present

## 2021-02-03 DIAGNOSIS — Z79899 Other long term (current) drug therapy: Secondary | ICD-10-CM | POA: Diagnosis not present

## 2021-02-03 DIAGNOSIS — E119 Type 2 diabetes mellitus without complications: Secondary | ICD-10-CM | POA: Diagnosis not present

## 2021-02-03 DIAGNOSIS — E038 Other specified hypothyroidism: Secondary | ICD-10-CM | POA: Diagnosis not present

## 2021-02-04 DIAGNOSIS — F419 Anxiety disorder, unspecified: Secondary | ICD-10-CM | POA: Diagnosis not present

## 2021-02-04 DIAGNOSIS — F4320 Adjustment disorder, unspecified: Secondary | ICD-10-CM | POA: Diagnosis not present

## 2021-02-12 DIAGNOSIS — M6281 Muscle weakness (generalized): Secondary | ICD-10-CM | POA: Diagnosis not present

## 2021-02-12 DIAGNOSIS — I5040 Unspecified combined systolic (congestive) and diastolic (congestive) heart failure: Secondary | ICD-10-CM | POA: Diagnosis not present

## 2021-02-12 DIAGNOSIS — N400 Enlarged prostate without lower urinary tract symptoms: Secondary | ICD-10-CM | POA: Diagnosis not present

## 2021-02-12 DIAGNOSIS — J9610 Chronic respiratory failure, unspecified whether with hypoxia or hypercapnia: Secondary | ICD-10-CM | POA: Diagnosis not present

## 2021-02-12 DIAGNOSIS — E785 Hyperlipidemia, unspecified: Secondary | ICD-10-CM | POA: Diagnosis not present

## 2021-02-12 DIAGNOSIS — G8929 Other chronic pain: Secondary | ICD-10-CM | POA: Diagnosis not present

## 2021-02-12 DIAGNOSIS — N189 Chronic kidney disease, unspecified: Secondary | ICD-10-CM | POA: Diagnosis not present

## 2021-02-12 DIAGNOSIS — M1990 Unspecified osteoarthritis, unspecified site: Secondary | ICD-10-CM | POA: Diagnosis not present

## 2021-02-12 DIAGNOSIS — E119 Type 2 diabetes mellitus without complications: Secondary | ICD-10-CM | POA: Diagnosis not present

## 2021-02-14 ENCOUNTER — Emergency Department (HOSPITAL_COMMUNITY)
Admission: EM | Admit: 2021-02-14 | Discharge: 2021-02-14 | Disposition: A | Discharge status: Home / Self Care | Payer: Medicare Other | Attending: Emergency Medicine | Admitting: Emergency Medicine

## 2021-02-14 ENCOUNTER — Encounter (HOSPITAL_COMMUNITY): Payer: Self-pay | Admitting: Emergency Medicine

## 2021-02-14 ENCOUNTER — Other Ambulatory Visit: Payer: Self-pay

## 2021-02-14 ENCOUNTER — Emergency Department (HOSPITAL_COMMUNITY): Payer: Medicare Other

## 2021-02-14 DIAGNOSIS — S060XAA Concussion with loss of consciousness status unknown, initial encounter: Secondary | ICD-10-CM | POA: Diagnosis not present

## 2021-02-14 DIAGNOSIS — R531 Weakness: Secondary | ICD-10-CM | POA: Diagnosis not present

## 2021-02-14 DIAGNOSIS — R0902 Hypoxemia: Secondary | ICD-10-CM | POA: Diagnosis not present

## 2021-02-14 DIAGNOSIS — I130 Hypertensive heart and chronic kidney disease with heart failure and stage 1 through stage 4 chronic kidney disease, or unspecified chronic kidney disease: Secondary | ICD-10-CM | POA: Diagnosis not present

## 2021-02-14 DIAGNOSIS — I2510 Atherosclerotic heart disease of native coronary artery without angina pectoris: Secondary | ICD-10-CM | POA: Diagnosis not present

## 2021-02-14 DIAGNOSIS — S0003XA Contusion of scalp, initial encounter: Secondary | ICD-10-CM | POA: Diagnosis not present

## 2021-02-14 DIAGNOSIS — S0101XA Laceration without foreign body of scalp, initial encounter: Secondary | ICD-10-CM | POA: Diagnosis not present

## 2021-02-14 DIAGNOSIS — Z955 Presence of coronary angioplasty implant and graft: Secondary | ICD-10-CM | POA: Insufficient documentation

## 2021-02-14 DIAGNOSIS — Z85828 Personal history of other malignant neoplasm of skin: Secondary | ICD-10-CM | POA: Insufficient documentation

## 2021-02-14 DIAGNOSIS — I5042 Chronic combined systolic (congestive) and diastolic (congestive) heart failure: Secondary | ICD-10-CM | POA: Insufficient documentation

## 2021-02-14 DIAGNOSIS — Z87891 Personal history of nicotine dependence: Secondary | ICD-10-CM | POA: Diagnosis not present

## 2021-02-14 DIAGNOSIS — Z7984 Long term (current) use of oral hypoglycemic drugs: Secondary | ICD-10-CM | POA: Insufficient documentation

## 2021-02-14 DIAGNOSIS — Z79899 Other long term (current) drug therapy: Secondary | ICD-10-CM | POA: Diagnosis not present

## 2021-02-14 DIAGNOSIS — R58 Hemorrhage, not elsewhere classified: Secondary | ICD-10-CM | POA: Diagnosis not present

## 2021-02-14 DIAGNOSIS — Z7982 Long term (current) use of aspirin: Secondary | ICD-10-CM | POA: Diagnosis not present

## 2021-02-14 DIAGNOSIS — Z23 Encounter for immunization: Secondary | ICD-10-CM | POA: Insufficient documentation

## 2021-02-14 DIAGNOSIS — R296 Repeated falls: Secondary | ICD-10-CM | POA: Diagnosis not present

## 2021-02-14 DIAGNOSIS — W01198A Fall on same level from slipping, tripping and stumbling with subsequent striking against other object, initial encounter: Secondary | ICD-10-CM | POA: Diagnosis not present

## 2021-02-14 DIAGNOSIS — N1832 Chronic kidney disease, stage 3b: Secondary | ICD-10-CM | POA: Insufficient documentation

## 2021-02-14 DIAGNOSIS — S060X0A Concussion without loss of consciousness, initial encounter: Secondary | ICD-10-CM | POA: Diagnosis not present

## 2021-02-14 DIAGNOSIS — E119 Type 2 diabetes mellitus without complications: Secondary | ICD-10-CM | POA: Insufficient documentation

## 2021-02-14 DIAGNOSIS — S0990XA Unspecified injury of head, initial encounter: Secondary | ICD-10-CM | POA: Diagnosis not present

## 2021-02-14 DIAGNOSIS — W19XXXA Unspecified fall, initial encounter: Secondary | ICD-10-CM | POA: Diagnosis not present

## 2021-02-14 LAB — BASIC METABOLIC PANEL
Anion gap: 7 (ref 5–15)
BUN: 20 mg/dL (ref 8–23)
CO2: 29 mmol/L (ref 22–32)
Calcium: 9.1 mg/dL (ref 8.9–10.3)
Chloride: 101 mmol/L (ref 98–111)
Creatinine, Ser: 1.22 mg/dL (ref 0.61–1.24)
GFR, Estimated: 56 mL/min — ABNORMAL LOW (ref >60)
Glucose, Bld: 177 mg/dL — ABNORMAL HIGH (ref 70–99)
Potassium: 4 mmol/L (ref 3.5–5.1)
Sodium: 137 mmol/L (ref 135–145)

## 2021-02-14 LAB — CBC WITH DIFFERENTIAL/PLATELET
Abs Immature Granulocytes: 0.02 10*3/uL (ref 0.00–0.07)
Basophils Absolute: 0 10*3/uL (ref 0.0–0.1)
Basophils Relative: 0 %
Eosinophils Absolute: 0.3 10*3/uL (ref 0.0–0.5)
Eosinophils Relative: 4 %
HCT: 37.4 % — ABNORMAL LOW (ref 39.0–52.0)
Hemoglobin: 12 g/dL — ABNORMAL LOW (ref 13.0–17.0)
Immature Granulocytes: 0 %
Lymphocytes Relative: 16 %
Lymphs Abs: 1.5 10*3/uL (ref 0.7–4.0)
MCH: 30.5 pg (ref 26.0–34.0)
MCHC: 32.1 g/dL (ref 30.0–36.0)
MCV: 95.2 fL (ref 80.0–100.0)
Monocytes Absolute: 0.8 10*3/uL (ref 0.1–1.0)
Monocytes Relative: 8 %
Neutro Abs: 6.5 10*3/uL (ref 1.7–7.7)
Neutrophils Relative %: 72 %
Platelets: 224 10*3/uL (ref 150–400)
RBC: 3.93 MIL/uL — ABNORMAL LOW (ref 4.22–5.81)
RDW: 13.5 % (ref 11.5–15.5)
WBC: 9.1 10*3/uL (ref 4.0–10.5)
nRBC: 0 % (ref 0.0–0.2)

## 2021-02-14 MED ORDER — LIDOCAINE-EPINEPHRINE (PF) 2 %-1:200000 IJ SOLN
INTRAMUSCULAR | Status: AC
  Filled 2021-02-14: qty 20

## 2021-02-14 MED ORDER — HYDROCODONE-ACETAMINOPHEN 5-325 MG PO TABS
1.0000 | ORAL_TABLET | Freq: Once | ORAL | Status: AC
  Administered 2021-02-14: 1 via ORAL
  Filled 2021-02-14: qty 1

## 2021-02-14 MED ORDER — TETANUS-DIPHTH-ACELL PERTUSSIS 5-2.5-18.5 LF-MCG/0.5 IM SUSY
0.5000 mL | PREFILLED_SYRINGE | Freq: Once | INTRAMUSCULAR | Status: AC
  Administered 2021-02-14: 0.5 mL via INTRAMUSCULAR
  Filled 2021-02-14: qty 0.5

## 2021-02-14 NOTE — ED Triage Notes (Signed)
From [facility] via EMS after he fell and hit the top of his head sustaining a large laceration per EMS. Site wrapped with bandages at this time to try and control the bleeding. Pt takes Plavix daily.

## 2021-02-14 NOTE — ED Notes (Signed)
Pt given breakfast tray and set up to eat.  

## 2021-02-14 NOTE — ED Notes (Signed)
Pt helped into recliner per request

## 2021-02-14 NOTE — ED Notes (Signed)
Pt brief changed and pt given a diet coke.

## 2021-02-14 NOTE — Discharge Instructions (Signed)
Please keep the bandage in place for 48 hours.  Then recheck the wound to ensure there is no further bleeding or signs of infection

## 2021-02-14 NOTE — ED Provider Notes (Signed)
[facility] EMERGENCY DEPARTMENT Provider Note   CSN: [id number] Arrival date & time: 02/14/21  0247     History Chief Complaint  Patient presents with   [name] is a 87 y.o. male.  The history is provided by the patient.  Fall This is a new problem. The problem has not changed since onset.Associated symptoms include headaches. Pertinent negatives include no abdominal pain. Nothing aggravates the symptoms. Nothing relieves the symptoms.  Patient with extensive medical history including CAD, hypertension, diabetes presents after fall.  Patient states a nursing facility.  Patient reports he fell after losing his balance.  No LOC.  Reports headache and bleeding from the wound.  He is on Plavix    Past Medical History:  Diagnosis Date   Aortic insufficiency    a. previously mild in 2012. b. trivial seen on echo in 05/2018.   Asbestos exposure    Bradycardia    MILD   CAD (coronary artery disease) 2003   a. s/p CABG in 1994. b. Cath 2003 patent grafts. c. DOE in 2020 felt anginal equivalent - cath 01/2019 s/p PCI/DES to the SVG-PDA.   Cancer [facility])    Skin   Carotid artery disease ([place]) 2001   BILATERAL 0-39%/  Follow with Dopplers by Dr. Woody Seller   Chronic combined systolic and diastolic CHF (congestive heart failure) ([place])    CKD (chronic kidney disease), stage III ([place])    Colitis    Diabetes mellitus    Dilated aortic root ([place]) 05/2018   Dizziness    He has had falls related to this / Chronic dizziness   Dyslipidemia    Hx of CABG    Hypertension    Nephrolithiasis    PVD (peripheral vascular disease) ([place])    Renal cyst    TIA (transient ischemic attack) 2007   ?? medical Rx     Patient Active Problem List   Diagnosis Date Noted   Lobar pneumonia ([place]) 11/19/2020   Acute respiratory failure with hypoxia ([place]) 11/19/2020   Acute renal failure superimposed on stage 3b chronic kidney disease ([place]) 11/19/2020   Sepsis ([place]) 11/17/2020   Diarrhea     Falls frequently 09/26/2019   CKD (chronic kidney disease), stage III ([place]) 02/16/2019   DOE (dyspnea on exertion) 02/15/2019   Mitral regurgitation    Aortic insufficiency    Right knee dislocation 05/26/2011   Knee dislocation 04/03/2011   Carotid artery disease ([facility])    Ejection fraction    SOB (shortness of breath)    Dizziness    Hx of CABG    Dyslipidemia    Hypertension    CAD (coronary artery disease) of artery bypass graft    TIA (transient ischemic attack)    Bradycardia    Diabetes mellitus type 2 in nonobese ([place]) 12/20/2008   COLITIS, HX OF 12/20/2008   NEPHROLITHIASIS, HX OF 12/20/2008   ASBESTOS EXPOSURE, HX OF 12/20/2008    Past Surgical History:  Procedure Laterality Date   CORONARY ARTERY BYPASS GRAFT     CORONARY STENT INTERVENTION N/A 02/15/2019   Procedure: CORONARY STENT INTERVENTION;  Surgeon: Jettie Booze, MD;  Location: [place] [facility];  Service: Cardiovascular;  Laterality: N/A;  SEQ SVG PDA-PL   LEFT HEART CATH AND CORS/GRAFTS ANGIOGRAPHY N/A 02/15/2019   Procedure: LEFT HEART CATH AND CORS/GRAFTS ANGIOGRAPHY;  Surgeon: Jettie Booze, MD;  Location: [place] [facility];  Service: Cardiovascular;  Laterality: N/A;   percutaneous transluminal  coronary angioplasty     hx       Family History  Problem Relation Age of Onset   Heart attack Mother 65    Social History   Tobacco Use   Smoking status: Former    Packs/day: 2.00    Years: 30.00    Pack years: 60.00    Types: Cigarettes    Start date: 12/[birth year]/1946    Quit date: 04/26/1980    Years since quitting: 40.8   Smokeless tobacco: Never  Vaping Use   Vaping Use: Never used  Substance Use Topics   Alcohol use: Never    Alcohol/week: 0.0 standard drinks   Drug use: Never    Home Medications Prior to Admission medications   Medication Sig Start Date End Date Taking? Authorizing Provider  acetaminophen (TYLENOL) 500 MG tablet Take 1,000 mg by mouth every 8 (eight) hours as  needed.    [provider]  albuterol (VENTOLIN HFA) 108 (90 Base) MCG/ACT inhaler Inhale 2 puffs into the lungs every 4 (four) hours as needed for wheezing or shortness of breath.    [provider]  aspirin EC 81 MG tablet Take 1 tablet (81 mg total) by mouth daily. 05/22/12   Carlena Bjornstad, MD  atorvastatin (LIPITOR) 20 MG tablet Take 20 mg by mouth every morning.  10/26/17   [provider]  citalopram (CELEXA) 20 MG tablet Take 20 mg by mouth daily.    [provider]  clopidogrel (PLAVIX) 75 MG tablet Take 1 tablet (75 mg total) by mouth daily. 02/21/19   Strader, Fransisco Hertz, PA-C  fluticasone (FLONASE) 50 MCG/ACT nasal spray Place 2 sprays into both nostrils daily.    [provider]  furosemide (LASIX) 40 MG tablet Take 40 mg by mouth 2 (two) times daily.    [provider]  HYDROcodone-acetaminophen (NORCO/VICODIN) 5-325 MG tablet Take 1 tablet by mouth every 4 (four) hours as needed for moderate pain.    [provider]  hydrOXYzine (ATARAX/VISTARIL) 25 MG tablet Take 25 mg by mouth every 6 (six) hours as needed for itching.    [provider]  loratadine (CLARITIN) 10 MG tablet Take 10 mg by mouth daily.    [provider]  magnesium hydroxide (MILK OF MAGNESIA) 400 MG/5ML suspension Take 30 mLs by mouth daily as needed for mild constipation.    [provider]  metFORMIN (GLUCOPHAGE) 500 MG tablet Take 500 mg by mouth at bedtime.     [provider]  metoprolol succinate (TOPROL-XL) 25 MG 24 hr tablet Take 25 mg by mouth daily.    [provider]  Multiple Vitamin (MULTIVITAMIN) tablet Take 1 tablet by mouth daily.    [provider]  nitroGLYCERIN (NITROSTAT) 0.4 MG SL tablet Place 0.4 mg under the tongue every 5 (five) minutes x 3 doses as needed for chest pain (if no relief after 2nd dose, proceed to the ED for an evaluation or call 911).    [provider]   polyethylene glycol (MIRALAX / GLYCOLAX) 17 g packet Take 17 g by mouth daily.    [provider]  potassium chloride SA (KLOR-CON) 20 MEQ tablet Take 20 mEq by mouth daily.    [provider]  pramipexole (MIRAPEX) 0.125 MG tablet Take 0.125 mg by mouth at bedtime.    [provider]  risperiDONE (RISPERDAL) 0.25 MG tablet Take 1 tablet (0.25 mg total) by mouth at bedtime. 09/28/19   Danford, Suann Larry, MD  sennosides-docusate sodium (SENOKOT-S) 8.6-50 MG tablet Take 2 tablets by mouth daily as needed for constipation.    [provider]  simethicone (MYLICON) 80 MG chewable tablet Chew 80 mg by mouth every 6 (six) hours as needed for flatulence.    [provider]  tamsulosin (FLOMAX) 0.4 MG CAPS capsule Take 0.4 mg by mouth daily after supper.    [provider]  traZODone (DESYREL) 50 MG tablet Take 50 mg by mouth at bedtime.    [provider]    Allergies    Sulfonamide derivatives and Tetracycline  Review of Systems   Review of Systems  Constitutional:  Negative for fever.  Gastrointestinal:  Negative for abdominal pain.  Musculoskeletal:  Negative for back pain and neck pain.  Neurological:  Positive for headaches.  All other systems reviewed and are negative.  Physical Exam Updated Vital Signs BP (!) 144/60 (BP Location: Left Arm)   Pulse 65   Temp 97.8 F (36.6 C) (Oral)   Resp 18   Ht 1.803 m (5\' 11" )   Wt 88.5 kg   SpO2 93%   BMI 27.20 kg/m   Physical Exam CONSTITUTIONAL: Elderly, no acute distress HEAD: Large laceration noted to scalp with active bleeding EYES: EOMI/PERRL ENMT: Mucous membranes moist, no visible trauma NECK: supple no meningeal signs SPINE/BACK:entire spine nontender CV: S1/S2 noted LUNGS: Lungs are clear to auscultation  Chest-no bruising or Crepitus ABDOMEN: soft, nontender, no bruising NEURO: Pt is awake/alert/appropriate, moves all extremitiesx4.  No facial droop.    EXTREMITIES: pulses normal/equal, full ROM, no deformity All extremities/joints palpated/ranged and nontender SKIN: warm, color normal PSYCH: no abnormalities of mood noted, alert and oriented to situation  ED Results / Procedures / Treatments   Labs (all labs ordered are listed, but only abnormal results are displayed) Labs Reviewed  BASIC METABOLIC PANEL - Abnormal; Notable for the following components:      Result Value   Glucose, Bld 177 (*)    GFR, Estimated 56 (*)    All other components within normal limits  CBC WITH DIFFERENTIAL/PLATELET - Abnormal; Notable for the following components:   RBC 3.93 (*)    Hemoglobin 12.0 (*)    HCT 37.4 (*)    All other components within normal limits    EKG None  Radiology CT Head Wo Contrast  Result Date: 02/14/2021 CLINICAL DATA:  Head trauma, minor EXAM: CT HEAD WITHOUT CONTRAST TECHNIQUE: Contiguous axial images were obtained from the base of the skull through the vertex without intravenous contrast. COMPARISON:  09/26/2019 FINDINGS: Brain: No evidence of acute infarction, hemorrhage, hydrocephalus, extra-axial collection or mass lesion/mass effect. Unchanged moderate cerebral atrophy. Vascular: No hyperdense vessel. Skull: No acute fracture. Sinuses/Orbits: Mucosal thickening in the ethmoid air cells. The orbits are unremarkable. Other: Moderate left frontal scalp hematoma. IMPRESSION: No acute intracranial process. Electronically Signed   By: Merilyn Baba M.D.   On: 02/14/2021 03:54    Procedures .Marland KitchenLaceration Repair  Date/Time: 02/14/2021 4:05 AM Performed by: Ripley Fraise, MD Authorized by: Ripley Fraise, MD   Consent:    Consent obtained:  Emergent situation Universal protocol:    Patient identity confirmed:  Provided demographic data Anesthesia:    Anesthesia method:  Local infiltration   Local anesthetic:  Lidocaine 2% WITH epi Laceration details:    Location:  Scalp   Length (cm):  6 Exploration:     Hemostasis achieved with:  Direct pressure and epinephrine   Contaminated: no   Skin repair:  Repair method:  Sutures   Suture size:  3-0   Wound skin closure material used: vicryl.   Suture technique:  Simple interrupted   Number of sutures:  10 Approximation:    Approximation:  Close Repair type:    Repair type:  Complex Post-procedure details:    Dressing:  Bulky dressing   Procedure completion:  Tolerated well, no immediate complications   Medications Ordered in ED Medications  lidocaine-EPINEPHrine (XYLOCAINE W/EPI) 2 %-1:200000 (PF) injection (  Given by Other 02/14/21 0332)  Tdap (BOOSTRIX) injection 0.5 mL (0.5 mLs Intramuscular Given 02/14/21 0353)    ED Course  I have reviewed the triage vital signs and the nursing notes.  Pertinent labs & imaging results that were available during my care of the patient were reviewed by me and considered in my medical decision making (see chart for details).    MDM Rules/Calculators/A&P                           Patient after accidental fall at the nursing home.  Patient had large wound to his scalp with active pulsatile bleeding.  The bleeding was easily controlled with direct pressure.  I infiltrated the wound with lidocaine with epinephrine.  It was not grossly contaminated.  However due to the active bleeding, I proceeded with emergent repair.  10 sutures were applied and a bulky dressing was applied with Coban.  Patient has tolerated this well.  I personally reviewed the CT head and it is negative.  Plan to check electrolytes and complete blood count due to significant blood loss. 4:52 AM Labs reassuring Wound still dressed, and no active bleeding noted Will send back to facility  Final Clinical Impression(s) / ED Diagnoses Final diagnoses:  Concussion with unknown loss of consciousness status, initial encounter  Laceration of scalp, initial encounter    Rx / DC Orders ED Discharge Orders     None        Ripley Fraise, MD 02/14/21 [phone]

## 2021-02-19 DIAGNOSIS — F419 Anxiety disorder, unspecified: Secondary | ICD-10-CM | POA: Diagnosis not present

## 2021-02-19 DIAGNOSIS — F5101 Primary insomnia: Secondary | ICD-10-CM | POA: Diagnosis not present

## 2021-02-19 DIAGNOSIS — F03918 Unspecified dementia, unspecified severity, with other behavioral disturbance: Secondary | ICD-10-CM | POA: Diagnosis not present

## 2021-02-21 DIAGNOSIS — F419 Anxiety disorder, unspecified: Secondary | ICD-10-CM | POA: Diagnosis not present

## 2021-02-21 DIAGNOSIS — F4320 Adjustment disorder, unspecified: Secondary | ICD-10-CM | POA: Diagnosis not present

## 2021-02-24 DIAGNOSIS — I739 Peripheral vascular disease, unspecified: Secondary | ICD-10-CM | POA: Diagnosis not present

## 2021-02-24 DIAGNOSIS — E118 Type 2 diabetes mellitus with unspecified complications: Secondary | ICD-10-CM | POA: Diagnosis not present

## 2021-02-24 DIAGNOSIS — E785 Hyperlipidemia, unspecified: Secondary | ICD-10-CM | POA: Diagnosis not present

## 2021-02-24 DIAGNOSIS — N400 Enlarged prostate without lower urinary tract symptoms: Secondary | ICD-10-CM | POA: Diagnosis not present

## 2021-02-24 DIAGNOSIS — I509 Heart failure, unspecified: Secondary | ICD-10-CM | POA: Diagnosis not present

## 2021-02-24 DIAGNOSIS — R0602 Shortness of breath: Secondary | ICD-10-CM | POA: Diagnosis not present

## 2021-02-24 DIAGNOSIS — A419 Sepsis, unspecified organism: Secondary | ICD-10-CM | POA: Diagnosis not present

## 2021-02-24 DIAGNOSIS — I519 Heart disease, unspecified: Secondary | ICD-10-CM | POA: Diagnosis not present

## 2021-02-24 DIAGNOSIS — I10 Essential (primary) hypertension: Secondary | ICD-10-CM | POA: Diagnosis not present

## 2021-02-24 DIAGNOSIS — N3946 Mixed incontinence: Secondary | ICD-10-CM | POA: Diagnosis not present

## 2021-02-24 DIAGNOSIS — R531 Weakness: Secondary | ICD-10-CM | POA: Diagnosis not present

## 2021-02-24 DIAGNOSIS — N1832 Chronic kidney disease, stage 3b: Secondary | ICD-10-CM | POA: Diagnosis not present

## 2021-02-24 DIAGNOSIS — R630 Anorexia: Secondary | ICD-10-CM | POA: Diagnosis not present

## 2021-02-25 DIAGNOSIS — E118 Type 2 diabetes mellitus with unspecified complications: Secondary | ICD-10-CM | POA: Diagnosis not present

## 2021-02-25 DIAGNOSIS — N1832 Chronic kidney disease, stage 3b: Secondary | ICD-10-CM | POA: Diagnosis not present

## 2021-02-25 DIAGNOSIS — E785 Hyperlipidemia, unspecified: Secondary | ICD-10-CM | POA: Diagnosis not present

## 2021-02-25 DIAGNOSIS — I519 Heart disease, unspecified: Secondary | ICD-10-CM | POA: Diagnosis not present

## 2021-02-25 DIAGNOSIS — I509 Heart failure, unspecified: Secondary | ICD-10-CM | POA: Diagnosis not present

## 2021-02-25 DIAGNOSIS — I10 Essential (primary) hypertension: Secondary | ICD-10-CM | POA: Diagnosis not present

## 2021-02-26 DIAGNOSIS — F419 Anxiety disorder, unspecified: Secondary | ICD-10-CM | POA: Diagnosis not present

## 2021-02-26 DIAGNOSIS — F4320 Adjustment disorder, unspecified: Secondary | ICD-10-CM | POA: Diagnosis not present

## 2021-02-27 DIAGNOSIS — N1832 Chronic kidney disease, stage 3b: Secondary | ICD-10-CM | POA: Diagnosis not present

## 2021-02-27 DIAGNOSIS — I509 Heart failure, unspecified: Secondary | ICD-10-CM | POA: Diagnosis not present

## 2021-02-27 DIAGNOSIS — E118 Type 2 diabetes mellitus with unspecified complications: Secondary | ICD-10-CM | POA: Diagnosis not present

## 2021-02-27 DIAGNOSIS — E785 Hyperlipidemia, unspecified: Secondary | ICD-10-CM | POA: Diagnosis not present

## 2021-02-27 DIAGNOSIS — I519 Heart disease, unspecified: Secondary | ICD-10-CM | POA: Diagnosis not present

## 2021-02-27 DIAGNOSIS — I10 Essential (primary) hypertension: Secondary | ICD-10-CM | POA: Diagnosis not present

## 2021-03-03 DIAGNOSIS — E118 Type 2 diabetes mellitus with unspecified complications: Secondary | ICD-10-CM | POA: Diagnosis not present

## 2021-03-03 DIAGNOSIS — I519 Heart disease, unspecified: Secondary | ICD-10-CM | POA: Diagnosis not present

## 2021-03-03 DIAGNOSIS — N1832 Chronic kidney disease, stage 3b: Secondary | ICD-10-CM | POA: Diagnosis not present

## 2021-03-03 DIAGNOSIS — I10 Essential (primary) hypertension: Secondary | ICD-10-CM | POA: Diagnosis not present

## 2021-03-03 DIAGNOSIS — I509 Heart failure, unspecified: Secondary | ICD-10-CM | POA: Diagnosis not present

## 2021-03-03 DIAGNOSIS — E785 Hyperlipidemia, unspecified: Secondary | ICD-10-CM | POA: Diagnosis not present

## 2021-03-04 DIAGNOSIS — E785 Hyperlipidemia, unspecified: Secondary | ICD-10-CM | POA: Diagnosis not present

## 2021-03-04 DIAGNOSIS — I10 Essential (primary) hypertension: Secondary | ICD-10-CM | POA: Diagnosis not present

## 2021-03-04 DIAGNOSIS — N1832 Chronic kidney disease, stage 3b: Secondary | ICD-10-CM | POA: Diagnosis not present

## 2021-03-04 DIAGNOSIS — I519 Heart disease, unspecified: Secondary | ICD-10-CM | POA: Diagnosis not present

## 2021-03-04 DIAGNOSIS — I509 Heart failure, unspecified: Secondary | ICD-10-CM | POA: Diagnosis not present

## 2021-03-04 DIAGNOSIS — E118 Type 2 diabetes mellitus with unspecified complications: Secondary | ICD-10-CM | POA: Diagnosis not present

## 2021-03-05 DIAGNOSIS — I519 Heart disease, unspecified: Secondary | ICD-10-CM | POA: Diagnosis not present

## 2021-03-05 DIAGNOSIS — N1832 Chronic kidney disease, stage 3b: Secondary | ICD-10-CM | POA: Diagnosis not present

## 2021-03-05 DIAGNOSIS — I509 Heart failure, unspecified: Secondary | ICD-10-CM | POA: Diagnosis not present

## 2021-03-05 DIAGNOSIS — F419 Anxiety disorder, unspecified: Secondary | ICD-10-CM | POA: Diagnosis not present

## 2021-03-05 DIAGNOSIS — E118 Type 2 diabetes mellitus with unspecified complications: Secondary | ICD-10-CM | POA: Diagnosis not present

## 2021-03-05 DIAGNOSIS — F5101 Primary insomnia: Secondary | ICD-10-CM | POA: Diagnosis not present

## 2021-03-05 DIAGNOSIS — I10 Essential (primary) hypertension: Secondary | ICD-10-CM | POA: Diagnosis not present

## 2021-03-05 DIAGNOSIS — F03918 Unspecified dementia, unspecified severity, with other behavioral disturbance: Secondary | ICD-10-CM | POA: Diagnosis not present

## 2021-03-05 DIAGNOSIS — E785 Hyperlipidemia, unspecified: Secondary | ICD-10-CM | POA: Diagnosis not present

## 2021-03-06 DIAGNOSIS — E785 Hyperlipidemia, unspecified: Secondary | ICD-10-CM | POA: Diagnosis not present

## 2021-03-06 DIAGNOSIS — I509 Heart failure, unspecified: Secondary | ICD-10-CM | POA: Diagnosis not present

## 2021-03-06 DIAGNOSIS — N1832 Chronic kidney disease, stage 3b: Secondary | ICD-10-CM | POA: Diagnosis not present

## 2021-03-06 DIAGNOSIS — I519 Heart disease, unspecified: Secondary | ICD-10-CM | POA: Diagnosis not present

## 2021-03-06 DIAGNOSIS — E118 Type 2 diabetes mellitus with unspecified complications: Secondary | ICD-10-CM | POA: Diagnosis not present

## 2021-03-06 DIAGNOSIS — I10 Essential (primary) hypertension: Secondary | ICD-10-CM | POA: Diagnosis not present

## 2021-03-09 DIAGNOSIS — E118 Type 2 diabetes mellitus with unspecified complications: Secondary | ICD-10-CM | POA: Diagnosis not present

## 2021-03-09 DIAGNOSIS — I519 Heart disease, unspecified: Secondary | ICD-10-CM | POA: Diagnosis not present

## 2021-03-09 DIAGNOSIS — N1832 Chronic kidney disease, stage 3b: Secondary | ICD-10-CM | POA: Diagnosis not present

## 2021-03-09 DIAGNOSIS — I10 Essential (primary) hypertension: Secondary | ICD-10-CM | POA: Diagnosis not present

## 2021-03-09 DIAGNOSIS — E785 Hyperlipidemia, unspecified: Secondary | ICD-10-CM | POA: Diagnosis not present

## 2021-03-09 DIAGNOSIS — I509 Heart failure, unspecified: Secondary | ICD-10-CM | POA: Diagnosis not present

## 2021-03-10 DIAGNOSIS — E785 Hyperlipidemia, unspecified: Secondary | ICD-10-CM | POA: Diagnosis not present

## 2021-03-10 DIAGNOSIS — E118 Type 2 diabetes mellitus with unspecified complications: Secondary | ICD-10-CM | POA: Diagnosis not present

## 2021-03-10 DIAGNOSIS — I519 Heart disease, unspecified: Secondary | ICD-10-CM | POA: Diagnosis not present

## 2021-03-10 DIAGNOSIS — I509 Heart failure, unspecified: Secondary | ICD-10-CM | POA: Diagnosis not present

## 2021-03-10 DIAGNOSIS — I10 Essential (primary) hypertension: Secondary | ICD-10-CM | POA: Diagnosis not present

## 2021-03-10 DIAGNOSIS — N1832 Chronic kidney disease, stage 3b: Secondary | ICD-10-CM | POA: Diagnosis not present

## 2021-03-11 DIAGNOSIS — I519 Heart disease, unspecified: Secondary | ICD-10-CM | POA: Diagnosis not present

## 2021-03-11 DIAGNOSIS — I509 Heart failure, unspecified: Secondary | ICD-10-CM | POA: Diagnosis not present

## 2021-03-11 DIAGNOSIS — E118 Type 2 diabetes mellitus with unspecified complications: Secondary | ICD-10-CM | POA: Diagnosis not present

## 2021-03-11 DIAGNOSIS — N1832 Chronic kidney disease, stage 3b: Secondary | ICD-10-CM | POA: Diagnosis not present

## 2021-03-11 DIAGNOSIS — E785 Hyperlipidemia, unspecified: Secondary | ICD-10-CM | POA: Diagnosis not present

## 2021-03-11 DIAGNOSIS — I10 Essential (primary) hypertension: Secondary | ICD-10-CM | POA: Diagnosis not present

## 2021-03-12 DIAGNOSIS — I10 Essential (primary) hypertension: Secondary | ICD-10-CM | POA: Diagnosis not present

## 2021-03-12 DIAGNOSIS — F4320 Adjustment disorder, unspecified: Secondary | ICD-10-CM | POA: Diagnosis not present

## 2021-03-12 DIAGNOSIS — E785 Hyperlipidemia, unspecified: Secondary | ICD-10-CM | POA: Diagnosis not present

## 2021-03-12 DIAGNOSIS — F419 Anxiety disorder, unspecified: Secondary | ICD-10-CM | POA: Diagnosis not present

## 2021-03-12 DIAGNOSIS — E118 Type 2 diabetes mellitus with unspecified complications: Secondary | ICD-10-CM | POA: Diagnosis not present

## 2021-03-12 DIAGNOSIS — I509 Heart failure, unspecified: Secondary | ICD-10-CM | POA: Diagnosis not present

## 2021-03-12 DIAGNOSIS — N1832 Chronic kidney disease, stage 3b: Secondary | ICD-10-CM | POA: Diagnosis not present

## 2021-03-12 DIAGNOSIS — I519 Heart disease, unspecified: Secondary | ICD-10-CM | POA: Diagnosis not present

## 2021-03-17 DIAGNOSIS — N1832 Chronic kidney disease, stage 3b: Secondary | ICD-10-CM | POA: Diagnosis not present

## 2021-03-17 DIAGNOSIS — I10 Essential (primary) hypertension: Secondary | ICD-10-CM | POA: Diagnosis not present

## 2021-03-17 DIAGNOSIS — E118 Type 2 diabetes mellitus with unspecified complications: Secondary | ICD-10-CM | POA: Diagnosis not present

## 2021-03-17 DIAGNOSIS — E785 Hyperlipidemia, unspecified: Secondary | ICD-10-CM | POA: Diagnosis not present

## 2021-03-17 DIAGNOSIS — I509 Heart failure, unspecified: Secondary | ICD-10-CM | POA: Diagnosis not present

## 2021-03-17 DIAGNOSIS — I519 Heart disease, unspecified: Secondary | ICD-10-CM | POA: Diagnosis not present

## 2021-03-19 DIAGNOSIS — I519 Heart disease, unspecified: Secondary | ICD-10-CM | POA: Diagnosis not present

## 2021-03-19 DIAGNOSIS — I10 Essential (primary) hypertension: Secondary | ICD-10-CM | POA: Diagnosis not present

## 2021-03-19 DIAGNOSIS — I509 Heart failure, unspecified: Secondary | ICD-10-CM | POA: Diagnosis not present

## 2021-03-19 DIAGNOSIS — E785 Hyperlipidemia, unspecified: Secondary | ICD-10-CM | POA: Diagnosis not present

## 2021-03-19 DIAGNOSIS — N1832 Chronic kidney disease, stage 3b: Secondary | ICD-10-CM | POA: Diagnosis not present

## 2021-03-19 DIAGNOSIS — E118 Type 2 diabetes mellitus with unspecified complications: Secondary | ICD-10-CM | POA: Diagnosis not present

## 2021-03-20 DIAGNOSIS — I519 Heart disease, unspecified: Secondary | ICD-10-CM | POA: Diagnosis not present

## 2021-03-20 DIAGNOSIS — I509 Heart failure, unspecified: Secondary | ICD-10-CM | POA: Diagnosis not present

## 2021-03-20 DIAGNOSIS — E785 Hyperlipidemia, unspecified: Secondary | ICD-10-CM | POA: Diagnosis not present

## 2021-03-20 DIAGNOSIS — I10 Essential (primary) hypertension: Secondary | ICD-10-CM | POA: Diagnosis not present

## 2021-03-20 DIAGNOSIS — N1832 Chronic kidney disease, stage 3b: Secondary | ICD-10-CM | POA: Diagnosis not present

## 2021-03-20 DIAGNOSIS — E118 Type 2 diabetes mellitus with unspecified complications: Secondary | ICD-10-CM | POA: Diagnosis not present

## 2021-03-24 DIAGNOSIS — E118 Type 2 diabetes mellitus with unspecified complications: Secondary | ICD-10-CM | POA: Diagnosis not present

## 2021-03-24 DIAGNOSIS — E785 Hyperlipidemia, unspecified: Secondary | ICD-10-CM | POA: Diagnosis not present

## 2021-03-24 DIAGNOSIS — I519 Heart disease, unspecified: Secondary | ICD-10-CM | POA: Diagnosis not present

## 2021-03-24 DIAGNOSIS — I10 Essential (primary) hypertension: Secondary | ICD-10-CM | POA: Diagnosis not present

## 2021-03-24 DIAGNOSIS — N1832 Chronic kidney disease, stage 3b: Secondary | ICD-10-CM | POA: Diagnosis not present

## 2021-03-24 DIAGNOSIS — I509 Heart failure, unspecified: Secondary | ICD-10-CM | POA: Diagnosis not present

## 2021-03-25 DIAGNOSIS — F419 Anxiety disorder, unspecified: Secondary | ICD-10-CM | POA: Diagnosis not present

## 2021-03-25 DIAGNOSIS — I519 Heart disease, unspecified: Secondary | ICD-10-CM | POA: Diagnosis not present

## 2021-03-25 DIAGNOSIS — N1832 Chronic kidney disease, stage 3b: Secondary | ICD-10-CM | POA: Diagnosis not present

## 2021-03-25 DIAGNOSIS — I509 Heart failure, unspecified: Secondary | ICD-10-CM | POA: Diagnosis not present

## 2021-03-25 DIAGNOSIS — I10 Essential (primary) hypertension: Secondary | ICD-10-CM | POA: Diagnosis not present

## 2021-03-25 DIAGNOSIS — F5101 Primary insomnia: Secondary | ICD-10-CM | POA: Diagnosis not present

## 2021-03-25 DIAGNOSIS — F03918 Unspecified dementia, unspecified severity, with other behavioral disturbance: Secondary | ICD-10-CM | POA: Diagnosis not present

## 2021-03-25 DIAGNOSIS — E118 Type 2 diabetes mellitus with unspecified complications: Secondary | ICD-10-CM | POA: Diagnosis not present

## 2021-03-25 DIAGNOSIS — E785 Hyperlipidemia, unspecified: Secondary | ICD-10-CM | POA: Diagnosis not present

## 2021-03-26 DIAGNOSIS — N3946 Mixed incontinence: Secondary | ICD-10-CM | POA: Diagnosis not present

## 2021-03-26 DIAGNOSIS — R0602 Shortness of breath: Secondary | ICD-10-CM | POA: Diagnosis not present

## 2021-03-26 DIAGNOSIS — I10 Essential (primary) hypertension: Secondary | ICD-10-CM | POA: Diagnosis not present

## 2021-03-26 DIAGNOSIS — R531 Weakness: Secondary | ICD-10-CM | POA: Diagnosis not present

## 2021-03-26 DIAGNOSIS — N400 Enlarged prostate without lower urinary tract symptoms: Secondary | ICD-10-CM | POA: Diagnosis not present

## 2021-03-26 DIAGNOSIS — I519 Heart disease, unspecified: Secondary | ICD-10-CM | POA: Diagnosis not present

## 2021-03-26 DIAGNOSIS — F419 Anxiety disorder, unspecified: Secondary | ICD-10-CM | POA: Diagnosis not present

## 2021-03-26 DIAGNOSIS — I739 Peripheral vascular disease, unspecified: Secondary | ICD-10-CM | POA: Diagnosis not present

## 2021-03-26 DIAGNOSIS — I509 Heart failure, unspecified: Secondary | ICD-10-CM | POA: Diagnosis not present

## 2021-03-26 DIAGNOSIS — F4320 Adjustment disorder, unspecified: Secondary | ICD-10-CM | POA: Diagnosis not present

## 2021-03-26 DIAGNOSIS — E785 Hyperlipidemia, unspecified: Secondary | ICD-10-CM | POA: Diagnosis not present

## 2021-03-26 DIAGNOSIS — E118 Type 2 diabetes mellitus with unspecified complications: Secondary | ICD-10-CM | POA: Diagnosis not present

## 2021-03-26 DIAGNOSIS — N1832 Chronic kidney disease, stage 3b: Secondary | ICD-10-CM | POA: Diagnosis not present

## 2021-03-26 DIAGNOSIS — A419 Sepsis, unspecified organism: Secondary | ICD-10-CM | POA: Diagnosis not present

## 2021-03-26 DIAGNOSIS — R630 Anorexia: Secondary | ICD-10-CM | POA: Diagnosis not present

## 2021-03-29 DIAGNOSIS — E785 Hyperlipidemia, unspecified: Secondary | ICD-10-CM | POA: Diagnosis not present

## 2021-03-29 DIAGNOSIS — I509 Heart failure, unspecified: Secondary | ICD-10-CM | POA: Diagnosis not present

## 2021-03-29 DIAGNOSIS — I10 Essential (primary) hypertension: Secondary | ICD-10-CM | POA: Diagnosis not present

## 2021-03-29 DIAGNOSIS — I519 Heart disease, unspecified: Secondary | ICD-10-CM | POA: Diagnosis not present

## 2021-03-29 DIAGNOSIS — N1832 Chronic kidney disease, stage 3b: Secondary | ICD-10-CM | POA: Diagnosis not present

## 2021-03-29 DIAGNOSIS — E118 Type 2 diabetes mellitus with unspecified complications: Secondary | ICD-10-CM | POA: Diagnosis not present

## 2021-03-30 DIAGNOSIS — N1832 Chronic kidney disease, stage 3b: Secondary | ICD-10-CM | POA: Diagnosis not present

## 2021-03-30 DIAGNOSIS — E118 Type 2 diabetes mellitus with unspecified complications: Secondary | ICD-10-CM | POA: Diagnosis not present

## 2021-03-30 DIAGNOSIS — I509 Heart failure, unspecified: Secondary | ICD-10-CM | POA: Diagnosis not present

## 2021-03-30 DIAGNOSIS — I519 Heart disease, unspecified: Secondary | ICD-10-CM | POA: Diagnosis not present

## 2021-03-30 DIAGNOSIS — E785 Hyperlipidemia, unspecified: Secondary | ICD-10-CM | POA: Diagnosis not present

## 2021-03-30 DIAGNOSIS — I10 Essential (primary) hypertension: Secondary | ICD-10-CM | POA: Diagnosis not present

## 2021-03-31 DIAGNOSIS — E119 Type 2 diabetes mellitus without complications: Secondary | ICD-10-CM | POA: Diagnosis not present

## 2021-03-31 DIAGNOSIS — R5382 Chronic fatigue, unspecified: Secondary | ICD-10-CM | POA: Diagnosis not present

## 2021-03-31 DIAGNOSIS — G8929 Other chronic pain: Secondary | ICD-10-CM | POA: Diagnosis not present

## 2021-03-31 DIAGNOSIS — I519 Heart disease, unspecified: Secondary | ICD-10-CM | POA: Diagnosis not present

## 2021-03-31 DIAGNOSIS — I10 Essential (primary) hypertension: Secondary | ICD-10-CM | POA: Diagnosis not present

## 2021-03-31 DIAGNOSIS — N1832 Chronic kidney disease, stage 3b: Secondary | ICD-10-CM | POA: Diagnosis not present

## 2021-03-31 DIAGNOSIS — E118 Type 2 diabetes mellitus with unspecified complications: Secondary | ICD-10-CM | POA: Diagnosis not present

## 2021-03-31 DIAGNOSIS — R1310 Dysphagia, unspecified: Secondary | ICD-10-CM | POA: Diagnosis not present

## 2021-03-31 DIAGNOSIS — F0390 Unspecified dementia without behavioral disturbance: Secondary | ICD-10-CM | POA: Diagnosis not present

## 2021-03-31 DIAGNOSIS — J9610 Chronic respiratory failure, unspecified whether with hypoxia or hypercapnia: Secondary | ICD-10-CM | POA: Diagnosis not present

## 2021-03-31 DIAGNOSIS — I5040 Unspecified combined systolic (congestive) and diastolic (congestive) heart failure: Secondary | ICD-10-CM | POA: Diagnosis not present

## 2021-03-31 DIAGNOSIS — M1990 Unspecified osteoarthritis, unspecified site: Secondary | ICD-10-CM | POA: Diagnosis not present

## 2021-03-31 DIAGNOSIS — E785 Hyperlipidemia, unspecified: Secondary | ICD-10-CM | POA: Diagnosis not present

## 2021-03-31 DIAGNOSIS — I509 Heart failure, unspecified: Secondary | ICD-10-CM | POA: Diagnosis not present

## 2021-03-31 DIAGNOSIS — M6281 Muscle weakness (generalized): Secondary | ICD-10-CM | POA: Diagnosis not present

## 2021-04-01 DIAGNOSIS — E785 Hyperlipidemia, unspecified: Secondary | ICD-10-CM | POA: Diagnosis not present

## 2021-04-01 DIAGNOSIS — I10 Essential (primary) hypertension: Secondary | ICD-10-CM | POA: Diagnosis not present

## 2021-04-01 DIAGNOSIS — I509 Heart failure, unspecified: Secondary | ICD-10-CM | POA: Diagnosis not present

## 2021-04-01 DIAGNOSIS — N1832 Chronic kidney disease, stage 3b: Secondary | ICD-10-CM | POA: Diagnosis not present

## 2021-04-01 DIAGNOSIS — E118 Type 2 diabetes mellitus with unspecified complications: Secondary | ICD-10-CM | POA: Diagnosis not present

## 2021-04-01 DIAGNOSIS — I519 Heart disease, unspecified: Secondary | ICD-10-CM | POA: Diagnosis not present

## 2021-04-02 DIAGNOSIS — E118 Type 2 diabetes mellitus with unspecified complications: Secondary | ICD-10-CM | POA: Diagnosis not present

## 2021-04-02 DIAGNOSIS — E785 Hyperlipidemia, unspecified: Secondary | ICD-10-CM | POA: Diagnosis not present

## 2021-04-02 DIAGNOSIS — N1832 Chronic kidney disease, stage 3b: Secondary | ICD-10-CM | POA: Diagnosis not present

## 2021-04-02 DIAGNOSIS — I509 Heart failure, unspecified: Secondary | ICD-10-CM | POA: Diagnosis not present

## 2021-04-02 DIAGNOSIS — I519 Heart disease, unspecified: Secondary | ICD-10-CM | POA: Diagnosis not present

## 2021-04-02 DIAGNOSIS — I10 Essential (primary) hypertension: Secondary | ICD-10-CM | POA: Diagnosis not present

## 2021-04-03 DIAGNOSIS — I10 Essential (primary) hypertension: Secondary | ICD-10-CM | POA: Diagnosis not present

## 2021-04-03 DIAGNOSIS — E785 Hyperlipidemia, unspecified: Secondary | ICD-10-CM | POA: Diagnosis not present

## 2021-04-03 DIAGNOSIS — I509 Heart failure, unspecified: Secondary | ICD-10-CM | POA: Diagnosis not present

## 2021-04-03 DIAGNOSIS — N1832 Chronic kidney disease, stage 3b: Secondary | ICD-10-CM | POA: Diagnosis not present

## 2021-04-03 DIAGNOSIS — I519 Heart disease, unspecified: Secondary | ICD-10-CM | POA: Diagnosis not present

## 2021-04-03 DIAGNOSIS — E118 Type 2 diabetes mellitus with unspecified complications: Secondary | ICD-10-CM | POA: Diagnosis not present

## 2021-04-06 DIAGNOSIS — I519 Heart disease, unspecified: Secondary | ICD-10-CM | POA: Diagnosis not present

## 2021-04-06 DIAGNOSIS — E785 Hyperlipidemia, unspecified: Secondary | ICD-10-CM | POA: Diagnosis not present

## 2021-04-06 DIAGNOSIS — I10 Essential (primary) hypertension: Secondary | ICD-10-CM | POA: Diagnosis not present

## 2021-04-06 DIAGNOSIS — N1832 Chronic kidney disease, stage 3b: Secondary | ICD-10-CM | POA: Diagnosis not present

## 2021-04-06 DIAGNOSIS — I509 Heart failure, unspecified: Secondary | ICD-10-CM | POA: Diagnosis not present

## 2021-04-06 DIAGNOSIS — E118 Type 2 diabetes mellitus with unspecified complications: Secondary | ICD-10-CM | POA: Diagnosis not present

## 2021-04-07 DIAGNOSIS — I10 Essential (primary) hypertension: Secondary | ICD-10-CM | POA: Diagnosis not present

## 2021-04-07 DIAGNOSIS — I519 Heart disease, unspecified: Secondary | ICD-10-CM | POA: Diagnosis not present

## 2021-04-07 DIAGNOSIS — N1832 Chronic kidney disease, stage 3b: Secondary | ICD-10-CM | POA: Diagnosis not present

## 2021-04-07 DIAGNOSIS — Z85828 Personal history of other malignant neoplasm of skin: Secondary | ICD-10-CM | POA: Diagnosis not present

## 2021-04-07 DIAGNOSIS — Z85820 Personal history of malignant melanoma of skin: Secondary | ICD-10-CM | POA: Diagnosis not present

## 2021-04-07 DIAGNOSIS — L570 Actinic keratosis: Secondary | ICD-10-CM | POA: Diagnosis not present

## 2021-04-07 DIAGNOSIS — E118 Type 2 diabetes mellitus with unspecified complications: Secondary | ICD-10-CM | POA: Diagnosis not present

## 2021-04-07 DIAGNOSIS — E785 Hyperlipidemia, unspecified: Secondary | ICD-10-CM | POA: Diagnosis not present

## 2021-04-07 DIAGNOSIS — L309 Dermatitis, unspecified: Secondary | ICD-10-CM | POA: Diagnosis not present

## 2021-04-07 DIAGNOSIS — I509 Heart failure, unspecified: Secondary | ICD-10-CM | POA: Diagnosis not present

## 2021-04-08 DIAGNOSIS — I519 Heart disease, unspecified: Secondary | ICD-10-CM | POA: Diagnosis not present

## 2021-04-08 DIAGNOSIS — E118 Type 2 diabetes mellitus with unspecified complications: Secondary | ICD-10-CM | POA: Diagnosis not present

## 2021-04-08 DIAGNOSIS — I10 Essential (primary) hypertension: Secondary | ICD-10-CM | POA: Diagnosis not present

## 2021-04-08 DIAGNOSIS — I509 Heart failure, unspecified: Secondary | ICD-10-CM | POA: Diagnosis not present

## 2021-04-08 DIAGNOSIS — N1832 Chronic kidney disease, stage 3b: Secondary | ICD-10-CM | POA: Diagnosis not present

## 2021-04-08 DIAGNOSIS — E785 Hyperlipidemia, unspecified: Secondary | ICD-10-CM | POA: Diagnosis not present

## 2021-04-09 DIAGNOSIS — E118 Type 2 diabetes mellitus with unspecified complications: Secondary | ICD-10-CM | POA: Diagnosis not present

## 2021-04-09 DIAGNOSIS — I509 Heart failure, unspecified: Secondary | ICD-10-CM | POA: Diagnosis not present

## 2021-04-09 DIAGNOSIS — N1832 Chronic kidney disease, stage 3b: Secondary | ICD-10-CM | POA: Diagnosis not present

## 2021-04-09 DIAGNOSIS — I519 Heart disease, unspecified: Secondary | ICD-10-CM | POA: Diagnosis not present

## 2021-04-09 DIAGNOSIS — I10 Essential (primary) hypertension: Secondary | ICD-10-CM | POA: Diagnosis not present

## 2021-04-09 DIAGNOSIS — E785 Hyperlipidemia, unspecified: Secondary | ICD-10-CM | POA: Diagnosis not present

## 2021-04-14 DIAGNOSIS — I5040 Unspecified combined systolic (congestive) and diastolic (congestive) heart failure: Secondary | ICD-10-CM | POA: Diagnosis not present

## 2021-04-14 DIAGNOSIS — I509 Heart failure, unspecified: Secondary | ICD-10-CM | POA: Diagnosis not present

## 2021-04-14 DIAGNOSIS — E785 Hyperlipidemia, unspecified: Secondary | ICD-10-CM | POA: Diagnosis not present

## 2021-04-14 DIAGNOSIS — N1832 Chronic kidney disease, stage 3b: Secondary | ICD-10-CM | POA: Diagnosis not present

## 2021-04-14 DIAGNOSIS — I519 Heart disease, unspecified: Secondary | ICD-10-CM | POA: Diagnosis not present

## 2021-04-14 DIAGNOSIS — I10 Essential (primary) hypertension: Secondary | ICD-10-CM | POA: Diagnosis not present

## 2021-04-14 DIAGNOSIS — G4700 Insomnia, unspecified: Secondary | ICD-10-CM | POA: Diagnosis not present

## 2021-04-14 DIAGNOSIS — F419 Anxiety disorder, unspecified: Secondary | ICD-10-CM | POA: Diagnosis not present

## 2021-04-14 DIAGNOSIS — E118 Type 2 diabetes mellitus with unspecified complications: Secondary | ICD-10-CM | POA: Diagnosis not present

## 2021-04-14 DIAGNOSIS — R0600 Dyspnea, unspecified: Secondary | ICD-10-CM | POA: Diagnosis not present

## 2021-04-15 DIAGNOSIS — N1832 Chronic kidney disease, stage 3b: Secondary | ICD-10-CM | POA: Diagnosis not present

## 2021-04-15 DIAGNOSIS — E118 Type 2 diabetes mellitus with unspecified complications: Secondary | ICD-10-CM | POA: Diagnosis not present

## 2021-04-15 DIAGNOSIS — I509 Heart failure, unspecified: Secondary | ICD-10-CM | POA: Diagnosis not present

## 2021-04-15 DIAGNOSIS — I519 Heart disease, unspecified: Secondary | ICD-10-CM | POA: Diagnosis not present

## 2021-04-15 DIAGNOSIS — E785 Hyperlipidemia, unspecified: Secondary | ICD-10-CM | POA: Diagnosis not present

## 2021-04-15 DIAGNOSIS — I10 Essential (primary) hypertension: Secondary | ICD-10-CM | POA: Diagnosis not present

## 2021-04-16 DIAGNOSIS — N1832 Chronic kidney disease, stage 3b: Secondary | ICD-10-CM | POA: Diagnosis not present

## 2021-04-16 DIAGNOSIS — I10 Essential (primary) hypertension: Secondary | ICD-10-CM | POA: Diagnosis not present

## 2021-04-16 DIAGNOSIS — I519 Heart disease, unspecified: Secondary | ICD-10-CM | POA: Diagnosis not present

## 2021-04-16 DIAGNOSIS — F5101 Primary insomnia: Secondary | ICD-10-CM | POA: Diagnosis not present

## 2021-04-16 DIAGNOSIS — E118 Type 2 diabetes mellitus with unspecified complications: Secondary | ICD-10-CM | POA: Diagnosis not present

## 2021-04-16 DIAGNOSIS — F03918 Unspecified dementia, unspecified severity, with other behavioral disturbance: Secondary | ICD-10-CM | POA: Diagnosis not present

## 2021-04-16 DIAGNOSIS — E785 Hyperlipidemia, unspecified: Secondary | ICD-10-CM | POA: Diagnosis not present

## 2021-04-16 DIAGNOSIS — I509 Heart failure, unspecified: Secondary | ICD-10-CM | POA: Diagnosis not present

## 2021-04-16 DIAGNOSIS — F419 Anxiety disorder, unspecified: Secondary | ICD-10-CM | POA: Diagnosis not present

## 2021-04-18 DIAGNOSIS — I509 Heart failure, unspecified: Secondary | ICD-10-CM | POA: Diagnosis not present

## 2021-04-18 DIAGNOSIS — N1832 Chronic kidney disease, stage 3b: Secondary | ICD-10-CM | POA: Diagnosis not present

## 2021-04-18 DIAGNOSIS — E118 Type 2 diabetes mellitus with unspecified complications: Secondary | ICD-10-CM | POA: Diagnosis not present

## 2021-04-18 DIAGNOSIS — I519 Heart disease, unspecified: Secondary | ICD-10-CM | POA: Diagnosis not present

## 2021-04-18 DIAGNOSIS — I10 Essential (primary) hypertension: Secondary | ICD-10-CM | POA: Diagnosis not present

## 2021-04-18 DIAGNOSIS — E785 Hyperlipidemia, unspecified: Secondary | ICD-10-CM | POA: Diagnosis not present

## 2021-04-20 DIAGNOSIS — I10 Essential (primary) hypertension: Secondary | ICD-10-CM | POA: Diagnosis not present

## 2021-04-20 DIAGNOSIS — E118 Type 2 diabetes mellitus with unspecified complications: Secondary | ICD-10-CM | POA: Diagnosis not present

## 2021-04-20 DIAGNOSIS — I519 Heart disease, unspecified: Secondary | ICD-10-CM | POA: Diagnosis not present

## 2021-04-20 DIAGNOSIS — N1832 Chronic kidney disease, stage 3b: Secondary | ICD-10-CM | POA: Diagnosis not present

## 2021-04-20 DIAGNOSIS — E785 Hyperlipidemia, unspecified: Secondary | ICD-10-CM | POA: Diagnosis not present

## 2021-04-20 DIAGNOSIS — I509 Heart failure, unspecified: Secondary | ICD-10-CM | POA: Diagnosis not present

## 2021-04-21 DIAGNOSIS — F4320 Adjustment disorder, unspecified: Secondary | ICD-10-CM | POA: Diagnosis not present

## 2021-04-21 DIAGNOSIS — F419 Anxiety disorder, unspecified: Secondary | ICD-10-CM | POA: Diagnosis not present

## 2021-04-22 DIAGNOSIS — E118 Type 2 diabetes mellitus with unspecified complications: Secondary | ICD-10-CM | POA: Diagnosis not present

## 2021-04-22 DIAGNOSIS — I10 Essential (primary) hypertension: Secondary | ICD-10-CM | POA: Diagnosis not present

## 2021-04-22 DIAGNOSIS — N1832 Chronic kidney disease, stage 3b: Secondary | ICD-10-CM | POA: Diagnosis not present

## 2021-04-22 DIAGNOSIS — I519 Heart disease, unspecified: Secondary | ICD-10-CM | POA: Diagnosis not present

## 2021-04-22 DIAGNOSIS — E785 Hyperlipidemia, unspecified: Secondary | ICD-10-CM | POA: Diagnosis not present

## 2021-04-22 DIAGNOSIS — I509 Heart failure, unspecified: Secondary | ICD-10-CM | POA: Diagnosis not present

## 2021-04-23 DIAGNOSIS — Z79899 Other long term (current) drug therapy: Secondary | ICD-10-CM | POA: Diagnosis not present

## 2021-04-23 DIAGNOSIS — E119 Type 2 diabetes mellitus without complications: Secondary | ICD-10-CM | POA: Diagnosis not present

## 2021-04-23 DIAGNOSIS — E7849 Other hyperlipidemia: Secondary | ICD-10-CM | POA: Diagnosis not present

## 2021-04-23 DIAGNOSIS — D518 Other vitamin B12 deficiency anemias: Secondary | ICD-10-CM | POA: Diagnosis not present

## 2021-04-24 DIAGNOSIS — N1832 Chronic kidney disease, stage 3b: Secondary | ICD-10-CM | POA: Diagnosis not present

## 2021-04-24 DIAGNOSIS — I519 Heart disease, unspecified: Secondary | ICD-10-CM | POA: Diagnosis not present

## 2021-04-24 DIAGNOSIS — I509 Heart failure, unspecified: Secondary | ICD-10-CM | POA: Diagnosis not present

## 2021-04-24 DIAGNOSIS — E785 Hyperlipidemia, unspecified: Secondary | ICD-10-CM | POA: Diagnosis not present

## 2021-04-24 DIAGNOSIS — E118 Type 2 diabetes mellitus with unspecified complications: Secondary | ICD-10-CM | POA: Diagnosis not present

## 2021-04-24 DIAGNOSIS — I10 Essential (primary) hypertension: Secondary | ICD-10-CM | POA: Diagnosis not present

## 2021-04-26 ENCOUNTER — Emergency Department (HOSPITAL_COMMUNITY)
Admission: EM | Admit: 2021-04-26 | Discharge: 2021-04-26 | Disposition: A | Discharge status: Home / Self Care | Payer: Medicare Other | Attending: Emergency Medicine | Admitting: Emergency Medicine

## 2021-04-26 ENCOUNTER — Encounter (HOSPITAL_COMMUNITY): Payer: Self-pay

## 2021-04-26 ENCOUNTER — Other Ambulatory Visit: Payer: Self-pay

## 2021-04-26 DIAGNOSIS — S0031XA Abrasion of nose, initial encounter: Secondary | ICD-10-CM | POA: Diagnosis not present

## 2021-04-26 DIAGNOSIS — S0083XA Contusion of other part of head, initial encounter: Secondary | ICD-10-CM | POA: Insufficient documentation

## 2021-04-26 DIAGNOSIS — S0990XA Unspecified injury of head, initial encounter: Secondary | ICD-10-CM | POA: Diagnosis present

## 2021-04-26 DIAGNOSIS — W19XXXA Unspecified fall, initial encounter: Secondary | ICD-10-CM | POA: Diagnosis not present

## 2021-04-26 DIAGNOSIS — Z7982 Long term (current) use of aspirin: Secondary | ICD-10-CM | POA: Diagnosis not present

## 2021-04-26 DIAGNOSIS — Z7902 Long term (current) use of antithrombotics/antiplatelets: Secondary | ICD-10-CM | POA: Insufficient documentation

## 2021-04-26 NOTE — ED Provider Notes (Signed)
[facility] EMERGENCY DEPARTMENT Provider Note   CSN: [id number] Arrival date & time: 04/26/21  2145     History  Chief Complaint  Patient presents with   [name] is a 88 y.o. male.  Pt here from [employer] home.  Pt is reported to have had 3 falls today.  Pt his his head the last time he fell.  Pt tells me he has bad balance.    The history is provided by the patient. No language interpreter was used.  Fall This is a new problem. The current episode started 1 to 2 hours ago. The problem occurs constantly. The problem has not changed since onset.Nothing aggravates the symptoms. Nothing relieves the symptoms. He has tried nothing for the symptoms. The treatment provided no relief.      Home Medications Prior to Admission medications   Medication Sig Start Date End Date Taking? Authorizing Provider  acetaminophen (TYLENOL) 500 MG tablet Take 1,000 mg by mouth every 8 (eight) hours as needed.    [provider]  albuterol (VENTOLIN HFA) 108 (90 Base) MCG/ACT inhaler Inhale 2 puffs into the lungs every 4 (four) hours as needed for wheezing or shortness of breath.    [provider]  aspirin EC 81 MG tablet Take 1 tablet (81 mg total) by mouth daily. 05/22/12   Carlena Bjornstad, MD  atorvastatin (LIPITOR) 20 MG tablet Take 20 mg by mouth every morning.  10/26/17   [provider]  citalopram (CELEXA) 20 MG tablet Take 20 mg by mouth daily.    [provider]  clopidogrel (PLAVIX) 75 MG tablet Take 1 tablet (75 mg total) by mouth daily. 02/21/19   Strader, Fransisco Hertz, PA-C  fluticasone (FLONASE) 50 MCG/ACT nasal spray Place 2 sprays into both nostrils daily.    [provider]  furosemide (LASIX) 40 MG tablet Take 40 mg by mouth 2 (two) times daily.    [provider]  HYDROcodone-acetaminophen (NORCO/VICODIN) 5-325 MG tablet Take 1 tablet by mouth every 4 (four) hours as needed for moderate pain.    [provider]  hydrOXYzine (ATARAX/VISTARIL) 25 MG tablet Take 25 mg by mouth every 6 (six) hours as needed for itching.    [provider]  loratadine (CLARITIN) 10 MG tablet Take 10 mg by mouth daily.    [provider]  magnesium hydroxide (MILK OF MAGNESIA) 400 MG/5ML suspension Take 30 mLs by mouth daily as needed for mild constipation.    [provider]  metFORMIN (GLUCOPHAGE) 500 MG tablet Take 500 mg by mouth at bedtime.     [provider]  metoprolol succinate (TOPROL-XL) 25 MG 24 hr tablet Take 25 mg by mouth daily.    [provider]  Multiple Vitamin (MULTIVITAMIN) tablet Take 1 tablet by mouth daily.    [provider]  nitroGLYCERIN (NITROSTAT) 0.4 MG SL tablet Place 0.4 mg under the tongue every 5 (five) minutes x 3 doses as needed for chest pain (if no relief after 2nd dose, proceed to the ED for an evaluation or call 911).    [provider]  polyethylene glycol (MIRALAX / GLYCOLAX) 17 g packet Take 17 g by mouth daily.    [provider]  potassium chloride SA (KLOR-CON) 20 MEQ tablet Take 20 mEq by mouth daily.    [provider]  pramipexole (MIRAPEX) 0.125 MG tablet Take 0.125 mg by mouth at bedtime.    [provider]  risperiDONE (RISPERDAL) 0.25 MG tablet Take 1 tablet (0.25 mg total) by mouth at bedtime. 09/28/19   Danford, Suann Larry, MD  sennosides-docusate sodium (SENOKOT-S) 8.6-50 MG tablet Take 2 tablets by mouth daily as needed for constipation.    [provider]  simethicone (MYLICON) 80 MG chewable tablet Chew 80 mg by mouth every 6 (six) hours as needed for flatulence.    [provider]  tamsulosin (FLOMAX) 0.4 MG CAPS capsule Take 0.4 mg by mouth daily after supper.    [provider]  traZODone (DESYREL) 50 MG tablet Take 50 mg by mouth at bedtime.    [provider]      Allergies    Sulfonamide derivatives and Tetracycline     Review of Systems   Review of Systems  All other systems reviewed and are negative.  Physical Exam Updated Vital Signs BP 101/62    Pulse 74    Temp 97.8 F (36.6 C) (Oral)    Resp 18    Ht 5\' 11"  (1.803 m)    Wt 88.5 kg    SpO2 95%    BMI 27.21 kg/m  Physical Exam Vitals and nursing note reviewed.  Constitutional:      Appearance: He is well-developed.  HENT:     Head: Normocephalic.     Comments: Abrasion forehead and nose     Mouth/Throat:     Mouth: Mucous membranes are dry.  Eyes:     Extraocular Movements: Extraocular movements intact.     Pupils: Pupils are equal, round, and reactive to light.  Cardiovascular:     Rate and Rhythm: Normal rate.  Pulmonary:     Effort: Pulmonary effort is normal.  Abdominal:     General: There is no distension.  Musculoskeletal:        General: Normal range of motion.     Cervical back: Normal range of motion.  Skin:    General: Skin is warm.  Neurological:     General: No focal deficit present.     Mental Status: He is alert and oriented to person, place, and time.    ED Results / Procedures / Treatments   Labs (all labs ordered are listed, but only abnormal results are displayed) Labs Reviewed - No data to display  EKG None  Radiology No results found.  Procedures Procedures    Medications Ordered in ED Medications - No data to display  ED Course/ Medical Decision Making/ A&P                           Medical Decision Making  MDM:  Pt is currently not on blood thinners.  I spoke with pt's son.  Pt is on hospice care.  Pt requested to stop medical treatment other than comfort care 2 weeks ago.  Pt's son reports pt would not want ct scan at this time.   Pt's records from last hospital admission reviewed, nursing home records reviewed.  Pt has superficial abrasions forehead,  He moves all extremities,  I do not find any evidence of fracture.          Final Clinical Impression(s) / ED Diagnoses Final  diagnoses:  Contusion of face, initial encounter    Rx / DC Orders ED Discharge Orders     None      An After Visit Summary was printed and given to the patient.    Fransico Meadow, [place]  04/26/21 2253    Noemi Chapel, MD 04/30/21 1453

## 2021-04-26 NOTE — ED Notes (Signed)
Patient denies pain and is resting comfortably.  

## 2021-04-26 NOTE — ED Triage Notes (Addendum)
Pt to ED by EMS from [place] following multiple unwitnessed falls today. Pt had a RA sat in the 80's with EMS. Pt presents with facial abrasions and bruising. Pt arrives lethargic, VSS, NADN. Pt is a hospice pt for end of life care.

## 2021-07-15 ENCOUNTER — Encounter (HOSPITAL_COMMUNITY): Payer: Self-pay

## 2021-07-15 ENCOUNTER — Emergency Department (HOSPITAL_COMMUNITY): Payer: Medicare Other

## 2021-07-15 ENCOUNTER — Other Ambulatory Visit: Payer: Self-pay

## 2021-07-15 ENCOUNTER — Emergency Department (HOSPITAL_COMMUNITY)
Admission: EM | Admit: 2021-07-15 | Discharge: 2021-07-15 | Disposition: A | Discharge status: Skilled Nursing Facility | Payer: Medicare Other | Attending: Emergency Medicine | Admitting: Emergency Medicine

## 2021-07-15 DIAGNOSIS — Z79899 Other long term (current) drug therapy: Secondary | ICD-10-CM | POA: Insufficient documentation

## 2021-07-15 DIAGNOSIS — S0101XA Laceration without foreign body of scalp, initial encounter: Secondary | ICD-10-CM | POA: Diagnosis not present

## 2021-07-15 DIAGNOSIS — S0990XA Unspecified injury of head, initial encounter: Secondary | ICD-10-CM

## 2021-07-15 DIAGNOSIS — W01198A Fall on same level from slipping, tripping and stumbling with subsequent striking against other object, initial encounter: Secondary | ICD-10-CM | POA: Diagnosis not present

## 2021-07-15 DIAGNOSIS — Z7982 Long term (current) use of aspirin: Secondary | ICD-10-CM | POA: Diagnosis not present

## 2021-07-15 DIAGNOSIS — W19XXXA Unspecified fall, initial encounter: Secondary | ICD-10-CM

## 2021-07-15 MED ORDER — ACETAMINOPHEN 325 MG PO TABS
650.0000 mg | ORAL_TABLET | Freq: Once | ORAL | Status: AC
  Administered 2021-07-15: 650 mg via ORAL
  Filled 2021-07-15: qty 2

## 2021-07-15 NOTE — ED Triage Notes (Signed)
Pt arrives from [facility] via [facility] for a fall. Pt states his walker slipped from beneath him and fell backwards hitting his head. EMS states he has a approx. 1 inch laceration to the back of his head, bleeding controlled at this time. Pt does have a c-collar in place. Pt is on blood thinners  ?

## 2021-07-15 NOTE — ED Notes (Signed)
Pt is 86% on RA, pt placed on 2L [place].  EMS states that pt wears o2 at home as needed.  ?

## 2021-07-15 NOTE — ED Notes (Signed)
C-com notified of patient needing transportation back to [facility]. ?

## 2021-07-15 NOTE — ED Notes (Signed)
Attempted to call report to [facility] x2. ?

## 2021-07-15 NOTE — ED Notes (Signed)
MD notified re: pts request for xray on R knee d/t pain ?

## 2021-07-15 NOTE — ED Notes (Signed)
C-com notified of patient needing transportation back to [facility] ?

## 2021-07-15 NOTE — ED Provider Notes (Signed)
? ?[facility]  ?Provider Note ? ?CSN: [id number] ?Arrival date & time: 07/15/21 0045 ? ?History ?Chief Complaint  ?Patient presents with  ? Fall  ? ? ?[name] is a 88 y.o. male brought to the ED via EMS from SNF. EMS reports patient's walker slipped and he fell backwards, striking his head. He is on Plavix. No reported LOC. Sustained a laceration to scalp. ? ? ?Home Medications ?Prior to Admission medications   ?Medication Sig Start Date End Date Taking? Authorizing Provider  ?acetaminophen (TYLENOL) 500 MG tablet Take 1,000 mg by mouth every 8 (eight) hours as needed.    [provider]  ?albuterol (VENTOLIN HFA) 108 (90 Base) MCG/ACT inhaler Inhale 2 puffs into the lungs every 4 (four) hours as needed for wheezing or shortness of breath.    [provider]  ?aspirin EC 81 MG tablet Take 1 tablet (81 mg total) by mouth daily. 05/22/12   Carlena Bjornstad, MD  ?atorvastatin (LIPITOR) 20 MG tablet Take 20 mg by mouth every morning.  10/26/17   [provider]  ?citalopram (CELEXA) 20 MG tablet Take 20 mg by mouth daily.    [provider]  ?clopidogrel (PLAVIX) 75 MG tablet Take 1 tablet (75 mg total) by mouth daily. 02/21/19   Strader, Fransisco Hertz, PA-C  ?fluticasone (FLONASE) 50 MCG/ACT nasal spray Place 2 sprays into both nostrils daily.    [provider]  ?furosemide (LASIX) 40 MG tablet Take 40 mg by mouth 2 (two) times daily.    [provider]  ?HYDROcodone-acetaminophen (NORCO/VICODIN) 5-325 MG tablet Take 1 tablet by mouth every 4 (four) hours as needed for moderate pain.    [provider]  ?hydrOXYzine (ATARAX/VISTARIL) 25 MG tablet Take 25 mg by mouth every 6 (six) hours as needed for itching.    [provider]  ?loratadine (CLARITIN) 10 MG tablet Take 10 mg by mouth daily.    [provider]  ?magnesium hydroxide (MILK OF MAGNESIA) 400 MG/5ML suspension Take 30 mLs by mouth daily as needed for mild  constipation.    [provider]  ?metFORMIN (GLUCOPHAGE) 500 MG tablet Take 500 mg by mouth at bedtime.     [provider]  ?metoprolol succinate (TOPROL-XL) 25 MG 24 hr tablet Take 25 mg by mouth daily.    [provider]  ?Multiple Vitamin (MULTIVITAMIN) tablet Take 1 tablet by mouth daily.    [provider]  ?nitroGLYCERIN (NITROSTAT) 0.4 MG SL tablet Place 0.4 mg under the tongue every 5 (five) minutes x 3 doses as needed for chest pain (if no relief after 2nd dose, proceed to the ED for an evaluation or call 911).    [provider]  ?polyethylene glycol (MIRALAX / GLYCOLAX) 17 g packet Take 17 g by mouth daily.    [provider]  ?potassium chloride SA (KLOR-CON) 20 MEQ tablet Take 20 mEq by mouth daily.    [provider]  ?pramipexole (MIRAPEX) 0.125 MG tablet Take 0.125 mg by mouth at bedtime.    [provider]  ?risperiDONE (RISPERDAL) 0.25 MG tablet Take 1 tablet (0.25 mg total) by mouth at bedtime. 09/28/19   Danford, Suann Larry, MD  ?sennosides-docusate sodium (SENOKOT-S) 8.6-50 MG tablet Take 2 tablets by mouth daily as needed for constipation.    [provider]  ?simethicone (MYLICON) 80 MG chewable tablet Chew 80 mg by mouth every 6 (six) hours as needed for flatulence.    [provider]  ?tamsulosin (FLOMAX) 0.4 MG CAPS capsule Take 0.4 mg by mouth daily after supper.    [provider]  ?traZODone (DESYREL) 50 MG tablet Take 50 mg by mouth at bedtime.    [provider]  ? ? ? ?Allergies    ?Sulfonamide derivatives and Tetracycline ? ? ?Review of Systems   ?Review of Systems ?Please see HPI for pertinent positives and negatives ? ?Physical Exam ?BP 132/75   Pulse 62   Resp 19   Ht '5\' 10"'$  (1.778 m)   Wt 83.9 kg   SpO2 95%   BMI 26.54 kg/m?  ? ?Physical Exam ?Vitals and nursing note reviewed.  ?Constitutional:   ?   Appearance: Normal appearance.  ?HENT:  ?   Head: Normocephalic.   ?   Comments: Superfical abrasion to crown of scalp. Also has a 2.5cm superficial laceration to high occipital scalp  ?   Nose: Nose normal.  ?   Mouth/Throat:  ?   Mouth: Mucous membranes are moist.  ?Eyes:  ?   Extraocular Movements: Extraocular movements intact.  ?   Conjunctiva/sclera: Conjunctivae normal.  ?Cardiovascular:  ?   Rate and Rhythm: Normal rate.  ?Pulmonary:  ?   Effort: Pulmonary effort is normal.  ?   Breath sounds: Normal breath sounds.  ?Abdominal:  ?   General: Abdomen is flat.  ?   Palpations: Abdomen is soft.  ?   Tenderness: There is no abdominal tenderness.  ?Musculoskeletal:     ?   General: No swelling. Normal range of motion.  ?   Cervical back: Neck supple. No tenderness.  ?Skin: ?   General: Skin is warm and dry.  ?Neurological:  ?   General: No focal deficit present.  ?   Mental Status: He is alert.  ?Psychiatric:     ?   Mood and Affect: Mood normal.  ? ? ?ED Results / Procedures / Treatments   ?EKG ?None ? ?Procedures ?Marland Kitchen.Laceration Repair ? ?Date/Time: 07/15/2021 2:18 AM ?Performed by: Truddie Hidden, MD ?Authorized by: Truddie Hidden, MD  ? ?Consent:  ?  Consent obtained:  Verbal ?  Consent given by:  Patient ?Anesthesia:  ?  Anesthesia method:  None ?Laceration details:  ?  Location:  Scalp ?  Length (cm):  2.5 ?Skin repair:  ?  Repair method:  Tissue adhesive ?Approximation:  ?  Approximation:  Close ?Repair type:  ?  Repair type:  Simple ?Post-procedure details:  ?  Dressing:  Open (no dressing) ? ?Medications Ordered in the ED ?Medications - No data to display ? ?Initial Impression and Plan ? Patient on blood thinner here with mechanical fall and head injury. Sent for CT. I personally viewed the images from radiology studies and agree with radiologist interpretation: CT neg for injury. Laceration repaired. No other signs of injury, will send back to SNF.  ? ? ?ED Course  ? ?  ? ? ?MDM Rules/Calculators/A&P ?Medical Decision Making ?Problems Addressed: ?Fall, initial  encounter: acute illness or injury ?Injury of head, initial encounter: acute illness or injury ?Laceration of scalp, initial encounter: acute illness or injury ? ?Amount and/or Complexity of Data Reviewed ?Radiology: ordered and independent interpretation performed. Decision-making details documented in ED Course. ?ECG/medicine tests: ordered and independent interpretation performed. Decision-making details documented in ED Course. ? ? ? ?Final Clinical Impression(s) / ED Diagnoses ?Final diagnoses:  ?Fall, initial encounter  ?Injury of head, initial encounter  ?Laceration of scalp, initial encounter  ? ? ?  Rx / DC Orders ?ED Discharge Orders   ? ? None  ? ?  ? ?  ?Truddie Hidden, MD ?07/15/21 [phone] ? ?

## 2021-09-15 ENCOUNTER — Telehealth: Payer: Self-pay | Admitting: Cardiology

## 2021-09-15 ENCOUNTER — Encounter: Payer: Self-pay | Admitting: Cardiology

## 2021-09-15 ENCOUNTER — Ambulatory Visit (INDEPENDENT_AMBULATORY_CARE_PROVIDER_SITE_OTHER): Admitting: Cardiology

## 2021-09-15 VITALS — BP 128/60 | HR 60 | Ht 71.0 in | Wt 189.6 lb

## 2021-09-15 DIAGNOSIS — E782 Mixed hyperlipidemia: Secondary | ICD-10-CM

## 2021-09-15 DIAGNOSIS — I5023 Acute on chronic systolic (congestive) heart failure: Secondary | ICD-10-CM | POA: Diagnosis not present

## 2021-09-15 DIAGNOSIS — I10 Essential (primary) hypertension: Secondary | ICD-10-CM | POA: Diagnosis not present

## 2021-09-15 DIAGNOSIS — I2510 Atherosclerotic heart disease of native coronary artery without angina pectoris: Secondary | ICD-10-CM

## 2021-09-15 MED ORDER — ATORVASTATIN CALCIUM 20 MG PO TABS: 20.0000 mg | ORAL_TABLET | Freq: Every morning | ORAL | 1 refills | Status: AC

## 2021-09-15 MED ORDER — FUROSEMIDE 40 MG PO TABS: ORAL_TABLET | ORAL | 3 refills | Status: AC

## 2021-09-15 NOTE — Progress Notes (Signed)
Clinical Summary [name] is a 88 y.o.male seen today for follow up of the following medical problems.   1. Chronic combined systolic/diastolic HF - 07/5807 echo LVEF 35-40%, grade I dd     - chronic SOB unchanged. Some LE edema - compliant with meds     2. CAD - CABG in 31-Jul-1992  - He underwent cardiac catheterization on 02/15/2019 which showed severe 3-vessel CAD with patent grafts to OM, LAD and diagonal but was noted to have 80% stenosis along SVG-PDA which was treated with PCI/DES placement - plavix stopped due to nosebleed and anemia by Dr Candis Musa, remains just on ASA   - no specific chest pains.  - off aspirin and statin, I do know why    4. HTN - compliant with meds     5. Hyperlipidemia -labs followed at nursing home - he is off statin, unclear why.    6. CKD 3     7. History of TIA   8. Dementia   Social history: His wife of 24 years passed away in 2014/08/01.  His daughter, [name], lives with him now. He has a son as well.  His daughter used to work at [employer] in [employer].     Past Medical History:  Diagnosis Date   Aortic insufficiency    a. previously mild in 2010-08-01. b. trivial seen on echo in 05/2018.   Asbestos exposure    Bradycardia    MILD   CAD (coronary artery disease) 2001-07-31   a. s/p CABG in July 31, 1992. b. Cath 31-Jul-2001 patent grafts. c. DOE in Aug 01, 2018 felt anginal equivalent - cath 01/2019 s/p PCI/DES to the SVG-PDA.   Cancer [facility])    Skin   Carotid artery disease ([place]) 01-Aug-1999   BILATERAL 0-39%/  Follow with Dopplers by Dr. Woody Seller   Chronic combined systolic and diastolic CHF (congestive heart failure) ([place])    CKD (chronic kidney disease), stage III ([place])    Colitis    Diabetes mellitus    Dilated aortic root ([place]) 05/2018   Dizziness    He has had falls related to this / Chronic dizziness   Dyslipidemia    Hx of CABG    Hypertension    Nephrolithiasis    PVD (peripheral vascular disease) ([place])    Renal cyst    TIA (transient ischemic attack)  07-31-05   ?? medical Rx      Allergies  Allergen Reactions   Sulfonamide Derivatives Rash   Tetracycline Rash     Current Outpatient Medications  Medication Sig Dispense Refill   acetaminophen (TYLENOL) 500 MG tablet Take 1,000 mg by mouth every 8 (eight) hours as needed.     albuterol (VENTOLIN HFA) 108 (90 Base) MCG/ACT inhaler Inhale 2 puffs into the lungs every 4 (four) hours as needed for wheezing or shortness of breath.     aspirin EC 81 MG tablet Take 1 tablet (81 mg total) by mouth daily.     atorvastatin (LIPITOR) 20 MG tablet Take 20 mg by mouth every morning.   4   citalopram (CELEXA) 20 MG tablet Take 20 mg by mouth daily.     clopidogrel (PLAVIX) 75 MG tablet Take 1 tablet (75 mg total) by mouth daily. 90 tablet 3   fluticasone (FLONASE) 50 MCG/ACT nasal spray Place 2 sprays into both nostrils daily.     furosemide (LASIX) 40 MG tablet Take 40 mg by mouth 2 (two) times daily.  HYDROcodone-acetaminophen (NORCO/VICODIN) 5-325 MG tablet Take 1 tablet by mouth every 4 (four) hours as needed for moderate pain.     hydrOXYzine (ATARAX/VISTARIL) 25 MG tablet Take 25 mg by mouth every 6 (six) hours as needed for itching.     loratadine (CLARITIN) 10 MG tablet Take 10 mg by mouth daily.     magnesium hydroxide (MILK OF MAGNESIA) 400 MG/5ML suspension Take 30 mLs by mouth daily as needed for mild constipation.     metFORMIN (GLUCOPHAGE) 500 MG tablet Take 500 mg by mouth at bedtime.      metoprolol succinate (TOPROL-XL) 25 MG 24 hr tablet Take 25 mg by mouth daily.     Multiple Vitamin (MULTIVITAMIN) tablet Take 1 tablet by mouth daily.     nitroGLYCERIN (NITROSTAT) 0.4 MG SL tablet Place 0.4 mg under the tongue every 5 (five) minutes x 3 doses as needed for chest pain (if no relief after 2nd dose, proceed to the ED for an evaluation or call 911).     polyethylene glycol (MIRALAX / GLYCOLAX) 17 g packet Take 17 g by mouth daily.     potassium chloride SA (KLOR-CON) 20 MEQ tablet  Take 20 mEq by mouth daily.     pramipexole (MIRAPEX) 0.125 MG tablet Take 0.125 mg by mouth at bedtime.     risperiDONE (RISPERDAL) 0.25 MG tablet Take 1 tablet (0.25 mg total) by mouth at bedtime.     sennosides-docusate sodium (SENOKOT-S) 8.6-50 MG tablet Take 2 tablets by mouth daily as needed for constipation.     simethicone (MYLICON) 80 MG chewable tablet Chew 80 mg by mouth every 6 (six) hours as needed for flatulence.     tamsulosin (FLOMAX) 0.4 MG CAPS capsule Take 0.4 mg by mouth daily after supper.     traZODone (DESYREL) 50 MG tablet Take 50 mg by mouth at bedtime.     No current facility-administered medications for this visit.     Past Surgical History:  Procedure Laterality Date   CORONARY ARTERY BYPASS GRAFT     CORONARY STENT INTERVENTION N/A 02/15/2019   Procedure: CORONARY STENT INTERVENTION;  Surgeon: Jettie Booze, MD;  Location: [facility];  Service: Cardiovascular;  Laterality: N/A;  SEQ SVG PDA-PL   LEFT HEART CATH AND CORS/GRAFTS ANGIOGRAPHY N/A 02/15/2019   Procedure: LEFT HEART CATH AND CORS/GRAFTS ANGIOGRAPHY;  Surgeon: Jettie Booze, MD;  Location: [facility];  Service: Cardiovascular;  Laterality: N/A;   percutaneous transluminal coronary angioplasty     hx     Allergies  Allergen Reactions   Sulfonamide Derivatives Rash   Tetracycline Rash      Family History  Problem Relation Age of Onset   Heart attack Mother 20     Social History [name] reports that he quit smoking about 41 years ago. His smoking use included cigarettes. He started smoking about 74 years ago. He has a 60.00 pack-year smoking history. He has never used smokeless tobacco. [name] reports no history of alcohol use.   Review of Systems CONSTITUTIONAL: No weight loss, fever, chills, weakness or fatigue.  HEENT: Eyes: No visual loss, blurred vision, double vision or yellow sclerae.No hearing loss, sneezing, congestion, runny nose or sore  throat.  SKIN: No rash or itching.  CARDIOVASCULAR: per hpi RESPIRATORY: No shortness of breath, cough or sputum.  GASTROINTESTINAL: No anorexia, nausea, vomiting or diarrhea. No abdominal pain or blood.  GENITOURINARY: No burning on urination, no polyuria NEUROLOGICAL: No headache, dizziness, syncope,  paralysis, ataxia, numbness or tingling in the extremities. No change in bowel or bladder control.  MUSCULOSKELETAL: No muscle, back pain, joint pain or stiffness.  LYMPHATICS: No enlarged nodes. No history of splenectomy.  PSYCHIATRIC: No history of depression or anxiety.  ENDOCRINOLOGIC: No reports of sweating, cold or heat intolerance. No polyuria or polydipsia.  Marland Kitchen   Physical Examination Today's Vitals   09/15/21 1053  BP: 128/60  Pulse: 60  SpO2: 96%  Weight: 189 lb 9.6 oz (86 kg)  Height: '5\' 11"'$  (1.803 m)   Body mass index is 26.44 kg/m.  Gen: resting comfortably, no acute distress HEENT: no scleral icterus, pupils equal round and reactive, no palptable cervical adenopathy,  CV: RRR, n m/r/g no jvd Resp: Clear to auscultation bilaterally GI: abdomen is soft, non-tender, non-distended, normal bowel sounds, no hepatosplenomegaly MSK: extremities are warm, 2+ bilateral LE edema Skin: warm, no rash Neuro:  no focal deficits Psych: appropriate affect   Diagnostic Studies  Echocardiogram: 05/2018 IMPRESSIONS    1. The left ventricle has moderately reduced systolic function, with an ejection fraction of 35-40%. The cavity size was normal. There is mildly increased left ventricular wall thickness. Left ventricular diastolic Doppler parameters are consistent with  impaired relaxation Indeterminent filling pressures Left ventricular diffuse hypokinesis.  2. Left atrial size was mildly dilated.  3. The mitral valve is normal in structure. There is mild mitral annular calcification present.  4. The tricuspid valve is normal in structure.  5. The aortic valve is tricuspid There  is mild thickening and sclerosis without any evidence of stenosis of the aortic valve. Aortic valve regurgitation is trivial by color flow Doppler.  6. There is mild dilatation of the aortic root.  7. Right atrial pressure is estimated at 10 mmHg.  8. The interatrial septum was not well visualized.     Cardiac Catheterization: 02/15/2019 Prox RCA lesion is 75% stenosed. RPDA lesion is 99% stenosed. SVG to PDA is patent with stenosis and was treated. Ost LAD to Prox LAD lesion is 95% stenosed. 2nd Mrg lesion is 100% stenosed. Mid LAD lesion is 100% stenosed. Jump graft LIMA to diag/LAD is widely patent. SVG to PDA Origin to Prox Graft lesion before RPDA is 80% stenosed. A drug-eluting stent was successfully placed using a STENT SYNERGY DES 3.5X20. Post intervention, there is a 5% residual stenosis. Jump graft between RPDA and RPAV is 100% stenosed. LV end diastolic pressure is moderately elevated. There is no aortic valve stenosis.   Severe three vessel disease.  Patent grafts to OM, LAD and diagonal.  PCI to SVG to PDA.     Continue DAPT with aggressive secondary prevention.  Watch overnight on telemetry.  Likely discharge tomorrow.   Minimal fluids post cath due to LV dysfunction and moderately elevated LVEDP.   Hold ACE-I until creatinine rechecked.    Assessment and Plan   1. Acute on chronic systolic HF -2+ bilateral LE edema today. Change lasix to '60mg'$  in AM and '40mg'$  in PM, check bmet/mg/bnp 2 weeks  2. CAD - no symptoms - unclear why off ASA, he denies any bleeding. Restart ASA '81mg'$   - request labs from pcp  3. Hyperlipidemia - unclear why off statin, restart atorvastatin '20mg'$  daily.    4. HTN - at goal, continue current meds    Arnoldo Lenis, M.D.

## 2021-09-15 NOTE — Telephone Encounter (Signed)
Pt's Hospice nurse states that per pt's daughter, pt will no longer need any appts or test in the future due to pt being on Hospice. Please advise

## 2021-09-15 NOTE — Patient Instructions (Addendum)
Medication Instructions:  Your physician has recommended you make the following change in your medication:  Restart aspirin 81 mg once a day Restart atorvastatin 20 mg once a day Start furosemide 60 mg (1.5 tablet) in the morning and 40 mg (1 tablet) in the evening  Labwork: BMET, BNP, Mag to be done in 2 weeks  Testing/Procedures: none  Follow-Up: Your physician recommends that you schedule a follow-up appointment in: 6 months  Any Other Special Instructions Will Be Listed Below (If Applicable).  If you need a refill on your cardiac medications before your next appointment, please call your pharmacy.

## 2021-09-18 NOTE — Telephone Encounter (Signed)
Noted, looks like future appointments for here have already been cancelled.

## 2021-09-18 NOTE — Telephone Encounter (Signed)
Agree, can d/c follow ups  Zandra Abts MD

## 2021-12-15 ENCOUNTER — Encounter (HOSPITAL_COMMUNITY): Payer: Self-pay

## 2021-12-15 ENCOUNTER — Emergency Department (HOSPITAL_COMMUNITY): Payer: Medicare Other

## 2021-12-15 ENCOUNTER — Emergency Department (HOSPITAL_COMMUNITY)
Admission: EM | Admit: 2021-12-15 | Discharge: 2021-12-15 | Disposition: A | Discharge status: Skilled Nursing Facility | Payer: Medicare Other | Attending: Emergency Medicine | Admitting: Emergency Medicine

## 2021-12-15 ENCOUNTER — Other Ambulatory Visit: Payer: Self-pay

## 2021-12-15 DIAGNOSIS — M549 Dorsalgia, unspecified: Secondary | ICD-10-CM | POA: Diagnosis present

## 2021-12-15 DIAGNOSIS — R600 Localized edema: Secondary | ICD-10-CM | POA: Diagnosis not present

## 2021-12-15 DIAGNOSIS — Z85828 Personal history of other malignant neoplasm of skin: Secondary | ICD-10-CM | POA: Diagnosis not present

## 2021-12-15 DIAGNOSIS — Z7982 Long term (current) use of aspirin: Secondary | ICD-10-CM | POA: Diagnosis not present

## 2021-12-15 DIAGNOSIS — Z79899 Other long term (current) drug therapy: Secondary | ICD-10-CM | POA: Insufficient documentation

## 2021-12-15 DIAGNOSIS — Z951 Presence of aortocoronary bypass graft: Secondary | ICD-10-CM | POA: Diagnosis not present

## 2021-12-15 DIAGNOSIS — W19XXXA Unspecified fall, initial encounter: Secondary | ICD-10-CM

## 2021-12-15 DIAGNOSIS — I2510 Atherosclerotic heart disease of native coronary artery without angina pectoris: Secondary | ICD-10-CM | POA: Insufficient documentation

## 2021-12-15 DIAGNOSIS — E1122 Type 2 diabetes mellitus with diabetic chronic kidney disease: Secondary | ICD-10-CM | POA: Insufficient documentation

## 2021-12-15 DIAGNOSIS — I7121 Aneurysm of the ascending aorta, without rupture: Secondary | ICD-10-CM

## 2021-12-15 DIAGNOSIS — Y92129 Unspecified place in nursing home as the place of occurrence of the external cause: Secondary | ICD-10-CM | POA: Diagnosis not present

## 2021-12-15 DIAGNOSIS — R109 Unspecified abdominal pain: Secondary | ICD-10-CM

## 2021-12-15 DIAGNOSIS — N183 Chronic kidney disease, stage 3 unspecified: Secondary | ICD-10-CM | POA: Diagnosis not present

## 2021-12-15 DIAGNOSIS — I129 Hypertensive chronic kidney disease with stage 1 through stage 4 chronic kidney disease, or unspecified chronic kidney disease: Secondary | ICD-10-CM | POA: Insufficient documentation

## 2021-12-15 MED ORDER — LIDOCAINE 5 % EX PTCH
2.0000 | MEDICATED_PATCH | CUTANEOUS | Status: DC
  Administered 2021-12-15: 2 via TRANSDERMAL
  Filled 2021-12-15: qty 2

## 2021-12-15 MED ORDER — ACETAMINOPHEN 325 MG PO TABS: 650.0000 mg | ORAL_TABLET | Freq: Four times a day (QID) | ORAL | 0 refills | Status: AC | PRN

## 2021-12-15 MED ORDER — LIDOCAINE 4 % EX PTCH: 2.0000 | MEDICATED_PATCH | CUTANEOUS | 0 refills | Status: AC

## 2021-12-15 MED ORDER — ACETAMINOPHEN 500 MG PO TABS
1000.0000 mg | ORAL_TABLET | Freq: Once | ORAL | Status: AC
  Administered 2021-12-15: 1000 mg via ORAL
  Filled 2021-12-15: qty 2

## 2021-12-15 NOTE — ED Notes (Signed)
Changed pts bedding, placed purewick on patient.

## 2021-12-15 NOTE — ED Notes (Signed)
Hospice nurse here to see pt.

## 2021-12-15 NOTE — ED Provider Notes (Signed)
[facility] Provider Note   CSN: [id number] Arrival date & time: 12/15/21  8182     History  Chief Complaint  Patient presents with   [name] is a 88 y.o. male.  88 year old male with complex past medical history not on blood thinners who presents after mechanical fall.  Patient reports that he was at his facility when he was standing up and started losing his balance backwards.  Says that he was shuffling backwards until he hit something on the left side of his back.  Denied any preceding symptoms including chest pain, shortness of breath or dizziness.  Reports that he did not have a head strike.  Reports that he is not having any headache, or neck pain at this time and did not have any LOC.  Denies any pain elsewhere aside from his left back that started after the fall.  Says that he has had some mild leg swelling recently and is completing treatment for a UTI but is otherwise doing well.   Fall   Past Medical History:  Diagnosis Date   Aortic insufficiency    a. previously mild in 2012. b. trivial seen on echo in 05/2018.   Asbestos exposure    Bradycardia    MILD   CAD (coronary artery disease) 2003   a. s/p CABG in 1994. b. Cath 2003 patent grafts. c. DOE in 2020 felt anginal equivalent - cath 01/2019 s/p PCI/DES to the SVG-PDA.   Cancer [facility])    Skin   Carotid artery disease ([place]) 2001   BILATERAL 0-39%/  Follow with [facility] by Dr. Woody Seller   Chronic combined systolic and diastolic CHF (congestive heart failure) ([place])    CKD (chronic kidney disease), stage III ([place])    Colitis    Diabetes mellitus    Dilated aortic root ([place]) 05/2018   Dizziness    He has had falls related to this / Chronic dizziness   Dyslipidemia    Hx of CABG    Hypertension    Nephrolithiasis    PVD (peripheral vascular disease) ([place])    Renal cyst    TIA (transient ischemic attack) 2007   ?? medical Rx        Home Medications Prior to Admission  medications   Medication Sig Start Date End Date Taking? Authorizing Provider  acetaminophen (TYLENOL) 325 MG tablet Take 2 tablets (650 mg total) by mouth every 6 (six) hours as needed for up to 14 days for mild pain. 12/15/21 12/29/21 Yes Fransico Meadow, MD  lidocaine (HM LIDOCAINE PATCH) 4 % Place 2 patches onto the skin daily. 12/15/21  Yes Fransico Meadow, MD  albuterol (PROVENTIL) (2.5 MG/3ML) 0.083% nebulizer solution Take 2.5 mg by nebulization every 4 (four) hours as needed.    [provider]  albuterol (VENTOLIN HFA) 108 (90 Base) MCG/ACT inhaler Inhale 2 puffs into the lungs every 4 (four) hours as needed for wheezing or shortness of breath.    [provider]  aspirin EC 81 MG tablet Take 1 tablet (81 mg total) by mouth daily. 05/22/12   Carlena Bjornstad, MD  atorvastatin (LIPITOR) 20 MG tablet Take 1 tablet (20 mg total) by mouth every morning. 09/15/21   Arnoldo Lenis, MD  busPIRone (BUSPAR) 5 MG tablet Take 1 tablet by mouth 2 (two) times daily. 02/17/21   [provider]  citalopram (CELEXA) 20 MG tablet Take 20 mg by mouth daily.  [provider]  fluticasone (FLONASE) 50 MCG/ACT nasal spray Place 2 sprays into both nostrils daily.    [provider]  furosemide (LASIX) 40 MG tablet Take 1.5 tablets (60 mg total) by mouth in the morning AND 1 tablet (40 mg total) every evening. 09/15/21   Branch, Alphonse Guild, MD  gabapentin (NEURONTIN) 300 MG capsule Take 300 mg by mouth 2 (two) times daily.    [provider]  HYDROcodone-acetaminophen (NORCO/VICODIN) 5-325 MG tablet Take 1 tablet by mouth 2 (two) times daily.    [provider]  hydrOXYzine (ATARAX/VISTARIL) 25 MG tablet Take 25 mg by mouth every 6 (six) hours as needed for itching.    [provider]  loratadine (CLARITIN) 10 MG tablet Take 10 mg by mouth daily.    [provider]  LORazepam (ATIVAN) 0.5 MG tablet Take 0.5 mg by mouth every  three (3) days as needed for anxiety.    [provider]  magnesium hydroxide (MILK OF MAGNESIA) 400 MG/5ML suspension Take 30 mLs by mouth daily as needed for mild constipation.    [provider]  metoprolol succinate (TOPROL-XL) 25 MG 24 hr tablet Take 25 mg by mouth daily.    [provider]  nitroGLYCERIN (NITROSTAT) 0.4 MG SL tablet Place 0.4 mg under the tongue every 5 (five) minutes x 3 doses as needed for chest pain (if no relief after 2nd dose, proceed to the ED for an evaluation or call 911).    [provider]  polyethylene glycol (MIRALAX / GLYCOLAX) 17 g packet Take 17 g by mouth daily.    [provider]  potassium chloride SA (KLOR-CON) 20 MEQ tablet Take 40 mEq by mouth daily.    [provider]  pramipexole (MIRAPEX) 0.125 MG tablet Take 0.125 mg by mouth at bedtime.    [provider]  risperiDONE (RISPERDAL) 0.25 MG tablet Take 1 tablet (0.25 mg total) by mouth at bedtime. 09/28/19   Danford, Suann Larry, MD  sennosides-docusate sodium (SENOKOT-S) 8.6-50 MG tablet Take 2 tablets by mouth daily as needed for constipation.    [provider]  simethicone (MYLICON) 80 MG chewable tablet Chew 80 mg by mouth every 6 (six) hours as needed for flatulence.    [provider]  tamsulosin (FLOMAX) 0.4 MG CAPS capsule Take 0.4 mg by mouth daily after supper.    [provider]  traZODone (DESYREL) 100 MG tablet Take 100 mg by mouth at bedtime.    [provider]      Allergies    Bactrim [sulfamethoxazole-trimethoprim], Sulfonamide derivatives, and Tetracycline    Review of Systems   Review of Systems  Physical Exam Updated Vital Signs BP 109/77   Pulse 68   Temp 97.7 F (36.5 C) (Oral)   Resp 20   Ht '5\' 11"'$  (1.803 m)   Wt 85.7 kg   SpO2 96%   BMI 26.36 kg/m  Physical Exam Vitals and nursing note reviewed.  Constitutional:      General: He is not in acute distress.     Appearance: He is well-developed.  HENT:     Head: Normocephalic and atraumatic.     Right Ear: External ear normal.     Left Ear: External ear normal.     Nose: Nose normal.  Eyes:     Extraocular Movements: Extraocular movements intact.     Conjunctiva/sclera: Conjunctivae normal.     Pupils: Pupils are equal, round, and reactive to light.  Neck:     Comments: No midline C-spine tenderness Cardiovascular:     Rate and Rhythm: Normal rate and regular rhythm.     Heart sounds: Normal heart sounds.     Comments: No tenderness to palpation over left ribs Pulmonary:     Effort: Pulmonary effort is normal. No respiratory distress.     Breath sounds: Normal breath sounds.  Abdominal:     General: There is no distension.     Palpations: Abdomen is soft. There is no mass.     Tenderness: There is no abdominal tenderness. There is no guarding.  Musculoskeletal:        General: No swelling.     Cervical back: Normal range of motion and neck supple.     Right lower leg: Edema present.     Left lower leg: Edema present.     Comments: No tenderness to palpation over clavicle, chest wall, bilateral upper extremities including all joints and snuffbox, bilateral lower extremities including all joints.    Skin:    General: Skin is warm and dry.     Capillary Refill: Capillary refill takes less than 2 seconds.  Neurological:     General: No focal deficit present.     Mental Status: He is alert and oriented to person, place, and time. Mental status is at baseline.     Comments: 5/5 strength in upper and lower extremities.  Intact sensation to light touch bilaterally.  Psychiatric:        Mood and Affect: Mood normal.        Behavior: Behavior normal.     ED Results / Procedures / Treatments   Labs (all labs ordered are listed, but only abnormal results are displayed) Labs Reviewed - No data to display  EKG None  Radiology CT Chest Wo Contrast  Result Date: 12/15/2021 CLINICAL DATA:   88 year old male with acute LEFT chest pain. EXAM: CT CHEST WITHOUT CONTRAST TECHNIQUE: Multidetector CT imaging of the chest was performed following the standard protocol without IV contrast. RADIATION DOSE REDUCTION: This exam was performed according to the departmental dose-optimization program which includes automated exposure control, adjustment of the mA and/or kV according to patient size and/or use of iterative reconstruction technique. COMPARISON:  None FINDINGS: Cardiovascular: Heart size is normal. CABG changes again identified. Coronary artery and aortic atherosclerotic calcifications are again identified. The ascending aorta measures 4 cm in greatest diameter. No pericardial effusion identified. Mediastinum/Nodes: No enlarged mediastinal or axillary lymph nodes. Thyroid gland, trachea, and esophagus demonstrate no significant findings. Lungs/Pleura: Mild-to-moderate bibasilar atelectasis/scarring, LEFT greater than RIGHT, noted. No airspace disease, mass, suspicious nodule, pleural effusion or pneumothorax identified. Upper Abdomen: No acute abnormality. Musculoskeletal: An equivocal nondisplaced fracture of the anterolateral LEFT 8th rib is noted. No other acute fractures are present. Remote RIGHT-sided rib fractures are identified. Please note that the some of the LOWER ribs are not visualized on this study. IMPRESSION: 1. Equivocal nondisplaced fracture of the anterolateral LEFT 8th rib. No other acute fractures are present. No evidence of pneumothorax or pleural effusion. Please note that the some of the LOWER ribs are not visualized on this study. 2. Mild-to-moderate bibasilar atelectasis/scarring, LEFT greater than RIGHT. 3. 4 cm ascending thoracic aortic aneurysm. If clinically indicated, recommend semi-annual imaging followup by CTA or MRA and referral to cardiothoracic surgery if not already obtained. This recommendation follows 2010 ACCF/AHA/AATS/ACR/ASA/SCA/SCAI/SIR/STS/SVM Guidelines for  the Diagnosis and Management of Patients With Thoracic Aortic Disease. Circulation. 2010; 121: W546-E70. Aortic aneurysm NOS (  ICD10-I71.9) 4. Coronary artery disease and aortic Atherosclerosis (ICD10-I70.0). Electronically Signed   By: Margarette Canada M.D.   On: 12/15/2021 09:27   DG Lumbar Spine 2-3 Views  Result Date: 12/15/2021 CLINICAL DATA:  Left paraspinal tenderness following a fall EXAM: LUMBAR SPINE - 2-3 VIEW; THORACIC SPINE 2 VIEWS COMPARISON:  Two-view chest radiograph 09/26/2019 FINDINGS: Thoracic: The upper thoracic vertebral bodies are suboptimally assessed due to overlying structures. Within this confine, the imaged vertebral body heights appear preserved, without definite evidence of fracture. There is dextrocurvature. There is no significant antero or retrolisthesis. There is multilevel degenerative endplate change throughout the thoracic spine. Lumbar: There are 5 non-rib-bearing lumbar-type vertebral bodies. Vertebral body heights are preserved, without definite evidence of fracture. There is levocurvature of the lumbar spine. There is disc space narrowing with degenerative endplate change most advanced at L5-S1. Facet arthropathy is most advanced at L4-L5 and L5-S1. The SI joints are intact. There is calcified atherosclerotic plaque in the abdominal aorta. IMPRESSION: No definite evidence of acute traumatic injury in the thoracic or lumbar spine. If there is persistent clinical concern, cross-sectional imaging is recommended. Electronically Signed   By: Valetta Mole M.D.   On: 12/15/2021 08:21   DG Thoracic Spine 2 View  Result Date: 12/15/2021 CLINICAL DATA:  Left paraspinal tenderness following a fall EXAM: LUMBAR SPINE - 2-3 VIEW; THORACIC SPINE 2 VIEWS COMPARISON:  Two-view chest radiograph 09/26/2019 FINDINGS: Thoracic: The upper thoracic vertebral bodies are suboptimally assessed due to overlying structures. Within this confine, the imaged vertebral body heights appear preserved,  without definite evidence of fracture. There is dextrocurvature. There is no significant antero or retrolisthesis. There is multilevel degenerative endplate change throughout the thoracic spine. Lumbar: There are 5 non-rib-bearing lumbar-type vertebral bodies. Vertebral body heights are preserved, without definite evidence of fracture. There is levocurvature of the lumbar spine. There is disc space narrowing with degenerative endplate change most advanced at L5-S1. Facet arthropathy is most advanced at L4-L5 and L5-S1. The SI joints are intact. There is calcified atherosclerotic plaque in the abdominal aorta. IMPRESSION: No definite evidence of acute traumatic injury in the thoracic or lumbar spine. If there is persistent clinical concern, cross-sectional imaging is recommended. Electronically Signed   By: Valetta Mole M.D.   On: 12/15/2021 08:21   DG Ribs Unilateral W/Chest Left  Result Date: 12/15/2021 CLINICAL DATA:  Trauma, fall EXAM: LEFT RIBS AND CHEST - 3+ VIEW COMPARISON:  Chest radiographs done on 11/17/2020 FINDINGS: There is a cortical irregularity in the anterior ends of left eighth and ninth ribs. There are linear densities in left lower lung field. Transverse diameter of heart is increased. There is previous coronary bypass surgery. No focal infiltrates are seen in right lung. Right lateral CP angle is clear. There is no pneumothorax. IMPRESSION: There is cortical irregularity in the anterior ends of left eighth and ninth ribs suggesting recent or old undisplaced fractures. Linear densities in left lower lung fields suggest scarring or subsegmental atelectasis. Blunting of left lateral CP angle suggests possible small effusion. Electronically Signed   By: Elmer Picker M.D.   On: 12/15/2021 08:20    Procedures Procedures   Medications Ordered in ED Medications  acetaminophen (TYLENOL) tablet 1,000 mg (1,000 mg Oral Given 12/15/21 5400)    ED Course/ Medical Decision Making/ A&P                            Medical Decision Making Amount and/or  Complexity of Data Reviewed Radiology: ordered.  Risk OTC drugs. Prescription drug management.   88 year old male with complex past medical history not on blood thinners who presents after mechanical fall.   Initial DDx: Rib fracture, bruised ribs, Soft tissue contusion Appears to be mechanical fall and patient did not have any head traumas no indication for head imaging at this time though I did consider that.  Plan:  Lumbar and thoracic spine x-ray Rib series on left  ED Summary:  Patient underwent the above work-up which showed questionable fractures on the left so he underwent a CT scan which did show an anterior rib fracture.  Patient is not having pain in this location so unclear if this is acute or chronic.  Patient was given Tylenol and lidocaine patches with improvement of his symptoms.  He was able to ambulate about the emergency department with the use of his walker.  He was then discharged home with a prescription for those medications and incentive spirometer.  He was informed of his ascending aortic aneurysm and instructed to follow-up with his primary doctor about it.  Records reviewed Care Everywhere/External Records and Nursing Home Documents  Final Clinical Impression(s) / ED Diagnoses Final diagnoses:  Flank pain  Fall, initial encounter  Aneurysm of ascending aorta without rupture ([place])    Rx / DC Orders ED Discharge Orders          Ordered    lidocaine (HM LIDOCAINE PATCH) 4 %  Every 24 hours        12/15/21 1056    acetaminophen (TYLENOL) 325 MG tablet  Every 6 hours PRN        12/15/21 1056              Fransico Meadow, MD 12/15/21 1827

## 2021-12-15 NOTE — ED Notes (Signed)
EDP at bedside for assessment 

## 2021-12-15 NOTE — ED Notes (Signed)
Pt ambulated with SpO2 monitoring. No significant decrease noted. Pt denied shortness of breath. Complains only of slightly increased pain. Walker used and minimal assistance required.

## 2021-12-15 NOTE — ED Notes (Addendum)
Pam said she could pick up pt per hospice nurse.

## 2021-12-15 NOTE — ED Triage Notes (Signed)
Pt arrived via [facility] from [facility] with c/o of fall . Left lower back pain,no head injury. Hx of multiple uti. Just finished ABT today. Pt reported he just lost his balance and fell onto the floor.

## 2021-12-15 NOTE — Discharge Instructions (Addendum)
Today you were seen in the emergency department for your your fall and flank pain.    In the emergency department you had x-rays and a CT scan which did show old rib fractures but no new rib fractures related to your fall.   You were found to have bulging of the large blood vessel that was incidentally found (thoracic aortic aneurysm) that was 4 cm.  Please follow-up with your primary doctor to see if routine monitoring is needed for this.  It is likely that you have bruised ribs so please take Tylenol and use lidocaine patches that we have given you as needed for your pain.  Please use an incentive spirometer to make sure that you are inflating her lungs all the way and this can also help prevent pneumonia.    Follow-up with your primary doctor in 2-3 days regarding your visit.    Return immediately to the emergency department if you experience any of the following: Worsening pain, fevers, productive cough, or any other concerning symptoms.    Thank you for visiting our Emergency Department. It was a pleasure taking care of you today.

## 2022-01-08 ENCOUNTER — Encounter (HOSPITAL_COMMUNITY): Payer: Self-pay

## 2022-01-08 ENCOUNTER — Emergency Department (HOSPITAL_COMMUNITY): Payer: Medicare Other

## 2022-01-08 ENCOUNTER — Other Ambulatory Visit: Payer: Self-pay

## 2022-01-08 ENCOUNTER — Emergency Department (HOSPITAL_COMMUNITY)
Admission: EM | Admit: 2022-01-08 | Discharge: 2022-01-08 | Disposition: A | Discharge status: Skilled Nursing Facility | Payer: Medicare Other | Attending: Emergency Medicine | Admitting: Emergency Medicine

## 2022-01-08 DIAGNOSIS — J189 Pneumonia, unspecified organism: Secondary | ICD-10-CM | POA: Diagnosis present

## 2022-01-08 DIAGNOSIS — R109 Unspecified abdominal pain: Secondary | ICD-10-CM | POA: Diagnosis not present

## 2022-01-08 DIAGNOSIS — I5042 Chronic combined systolic (congestive) and diastolic (congestive) heart failure: Secondary | ICD-10-CM | POA: Diagnosis not present

## 2022-01-08 DIAGNOSIS — Z7982 Long term (current) use of aspirin: Secondary | ICD-10-CM | POA: Insufficient documentation

## 2022-01-08 DIAGNOSIS — N189 Chronic kidney disease, unspecified: Secondary | ICD-10-CM | POA: Diagnosis not present

## 2022-01-08 DIAGNOSIS — Z951 Presence of aortocoronary bypass graft: Secondary | ICD-10-CM | POA: Diagnosis not present

## 2022-01-08 DIAGNOSIS — F0390 Unspecified dementia without behavioral disturbance: Secondary | ICD-10-CM | POA: Insufficient documentation

## 2022-01-08 DIAGNOSIS — W19XXXA Unspecified fall, initial encounter: Secondary | ICD-10-CM | POA: Diagnosis not present

## 2022-01-08 DIAGNOSIS — D72829 Elevated white blood cell count, unspecified: Secondary | ICD-10-CM | POA: Diagnosis not present

## 2022-01-08 DIAGNOSIS — I2510 Atherosclerotic heart disease of native coronary artery without angina pectoris: Secondary | ICD-10-CM | POA: Diagnosis not present

## 2022-01-08 LAB — URINALYSIS, ROUTINE W REFLEX MICROSCOPIC
Bilirubin Urine: NEGATIVE
Glucose, UA: NEGATIVE mg/dL
Hgb urine dipstick: NEGATIVE
Ketones, ur: NEGATIVE mg/dL
Leukocytes,Ua: NEGATIVE
Nitrite: NEGATIVE
Protein, ur: NEGATIVE mg/dL
Specific Gravity, Urine: 1.006 (ref 1.005–1.030)
pH: 6 (ref 5.0–8.0)

## 2022-01-08 LAB — COMPREHENSIVE METABOLIC PANEL
ALT: 10 U/L (ref 0–44)
AST: 12 U/L — ABNORMAL LOW (ref 15–41)
Albumin: 4 g/dL (ref 3.5–5.0)
Alkaline Phosphatase: 77 U/L (ref 38–126)
Anion gap: 11 (ref 5–15)
BUN: 27 mg/dL — ABNORMAL HIGH (ref 8–23)
CO2: 29 mmol/L (ref 22–32)
Calcium: 9 mg/dL (ref 8.9–10.3)
Chloride: 101 mmol/L (ref 98–111)
Creatinine, Ser: 1.46 mg/dL — ABNORMAL HIGH (ref 0.61–1.24)
GFR, Estimated: 45 mL/min — ABNORMAL LOW (ref >60)
Glucose, Bld: 118 mg/dL — ABNORMAL HIGH (ref 70–99)
Potassium: 3.6 mmol/L (ref 3.5–5.1)
Sodium: 141 mmol/L (ref 135–145)
Total Bilirubin: 1.6 mg/dL — ABNORMAL HIGH (ref 0.3–1.2)
Total Protein: 7.8 g/dL (ref 6.5–8.1)

## 2022-01-08 LAB — CBC WITH DIFFERENTIAL/PLATELET
Abs Immature Granulocytes: 0.04 10*3/uL (ref 0.00–0.07)
Basophils Absolute: 0 10*3/uL (ref 0.0–0.1)
Basophils Relative: 0 %
Eosinophils Absolute: 0.5 10*3/uL (ref 0.0–0.5)
Eosinophils Relative: 3 %
HCT: 40.8 % (ref 39.0–52.0)
Hemoglobin: 13.2 g/dL (ref 13.0–17.0)
Immature Granulocytes: 0 %
Lymphocytes Relative: 13 %
Lymphs Abs: 1.8 10*3/uL (ref 0.7–4.0)
MCH: 29.9 pg (ref 26.0–34.0)
MCHC: 32.4 g/dL (ref 30.0–36.0)
MCV: 92.5 fL (ref 80.0–100.0)
Monocytes Absolute: 0.8 10*3/uL (ref 0.1–1.0)
Monocytes Relative: 6 %
Neutro Abs: 10.6 10*3/uL — ABNORMAL HIGH (ref 1.7–7.7)
Neutrophils Relative %: 78 %
Platelets: 210 10*3/uL (ref 150–400)
RBC: 4.41 MIL/uL (ref 4.22–5.81)
RDW: 13.5 % (ref 11.5–15.5)
WBC: 13.7 10*3/uL — ABNORMAL HIGH (ref 4.0–10.5)
nRBC: 0 % (ref 0.0–0.2)

## 2022-01-08 LAB — LIPASE, BLOOD: Lipase: 37 U/L (ref 11–51)

## 2022-01-08 MED ORDER — IOHEXOL 300 MG/ML  SOLN
80.0000 mL | Freq: Once | INTRAMUSCULAR | Status: AC | PRN
  Administered 2022-01-08: 80 mL via INTRAVENOUS

## 2022-01-08 MED ORDER — AMOXICILLIN-POT CLAVULANATE 875-125 MG PO TABS: 1.0000 | ORAL_TABLET | Freq: Two times a day (BID) | ORAL | 0 refills | Status: AC

## 2022-01-08 MED ORDER — AZITHROMYCIN 250 MG PO TABS: 250.0000 mg | ORAL_TABLET | Freq: Every day | ORAL | 0 refills | Status: AC

## 2022-01-08 NOTE — ED Triage Notes (Signed)
Pt arrived [facility] for unwitnessed fall this morning at [facility] point. Staff gave pt 0.'5mg'$  Ativan at [phone] according to [facility] and its a new order from yesterday. Pt was found sitting in floor and staff said pt is not alert as normal. Very small abrasion to head. Pt is hospice and has a DNR. Bottom teeth with pt.

## 2022-01-08 NOTE — ED Notes (Signed)
Janett Billow updated of returning to facility.

## 2022-01-08 NOTE — ED Notes (Signed)
Updated Janett Billow at the facility [phone] and she requested an update if he is admitted .

## 2022-01-08 NOTE — ED Notes (Signed)
Attempted to call report to [facility], as pt will be returning to facility. Staff did not answer, EMS present for transfer back to facility. AVS and all report given to them.

## 2022-01-08 NOTE — ED Notes (Signed)
Patient transported to CT 

## 2022-01-08 NOTE — Discharge Instructions (Addendum)
Your imaging reveals that you have possible pneumonia, of Sarahn antibiotics we take as prescribed, please follow-up with your primary care doctor. Imaging also reveals chronic fractures of your transverse processes L1-L3, please follow with your primary care doctor for further evaluation. Come back to the emergency department if you develop chest pain, shortness of breath, severe abdominal pain, uncontrolled nausea, vomiting, diarrhea.

## 2022-01-08 NOTE — ED Notes (Signed)
Facility feels pt may have another UTI.

## 2022-01-08 NOTE — ED Provider Notes (Signed)
[facility] EMERGENCY DEPARTMENT Provider Note   CSN: [id number] Arrival date & time: 01/08/22  1224     History  Chief Complaint  Patient presents with   [name] is a 88 y.o. male.  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  Patient is a poor historian and was unable to ascertain very much information from him, he states that he has no complaints at this time.  Spoke with nursing staff from [facility] they informed that patient's had attempted to pick something up off the floor and hit his head while doing so, this was an unwitnessed fall, they states that prior to this the patient was at his normal baseline he is walks with a walker, there is no cough no congestion no stomach pains no nausea or vomiting, he has been eating drinking out difficulty, they were concerned that he might have a UTI as he frequently gets these, patient is a DNR is currently receiving hospice care  Home Medications Prior to Admission medications   Medication Sig Start Date End Date Taking? Authorizing Provider  acetaminophen (TYLENOL) 500 MG tablet Take 2 tablets by mouth every 8 (eight) hours as needed for pain or fever. 11/20/20  Yes [provider]  albuterol (PROVENTIL) (2.5 MG/3ML) 0.083% nebulizer solution Take 2.5 mg by nebulization every 4 (four) hours as needed for wheezing or shortness of breath.   Yes [provider]  albuterol (VENTOLIN HFA) 108 (90 Base) MCG/ACT inhaler Inhale 2 puffs into the lungs every 4 (four) hours as needed for wheezing or shortness of breath.   Yes [provider]  amoxicillin-clavulanate (AUGMENTIN) 875-125 MG tablet Take 1 tablet by mouth every 12 (twelve) hours for 5 days. 01/08/22 01/13/22 Yes Marcello Fennel, PA-C  aspirin EC 81 MG tablet Take 1 tablet (81 mg total) by mouth daily. 05/22/12  Yes Carlena Bjornstad, MD  atorvastatin (LIPITOR) 20 MG tablet Take 1 tablet (20 mg total) by mouth every morning. 09/15/21  Yes Branch, Alphonse Guild, MD  azithromycin (ZITHROMAX) 250 MG tablet Take 1 tablet (250 mg total) by mouth daily. Take first 2 tablets together, then 1 every day until finished. 01/08/22  Yes Marcello Fennel, PA-C  busPIRone (BUSPAR) 5 MG tablet Take 1 tablet by mouth 2 (two) times daily. 02/17/21  Yes [provider]  citalopram (CELEXA) 20 MG tablet Take 20 mg by mouth daily.   Yes [provider]  fluticasone (FLONASE) 50 MCG/ACT nasal spray Place 2 sprays into both nostrils daily.   Yes [provider]  furosemide (LASIX) 40 MG tablet Take 1.5 tablets (60 mg total) by mouth in the morning AND 1 tablet (40 mg total) every evening. 09/15/21  Yes Branch, Alphonse Guild, MD  gabapentin (NEURONTIN) 300 MG capsule Take 300 mg by mouth 2 (two) times daily.   Yes [provider]  HYDROcodone-acetaminophen (NORCO/VICODIN) 5-325 MG tablet Take 1 tablet by mouth 2 (two) times daily.   Yes [provider]  hydrOXYzine (ATARAX/VISTARIL) 25 MG tablet Take 25 mg by mouth every 6 (six) hours as needed for itching.   Yes [provider]  lidocaine (HM LIDOCAINE PATCH) 4 % Place 2 patches onto the skin daily. Patient taking differently: Place 1 patch onto the skin daily. 12/15/21  Yes Fransico Meadow, MD  loratadine (CLARITIN) 10  MG tablet Take 10 mg by mouth daily.   Yes [provider]  LORazepam (ATIVAN) 0.5 MG tablet Take 0.5 mg by mouth every 3 (three) hours as needed for anxiety.   Yes [provider]  meclizine (ANTIVERT) 12.5 MG tablet Take 12.5 mg by mouth daily as needed for dizziness.   Yes [provider]  metoprolol succinate (TOPROL-XL) 25 MG 24 hr tablet Take 25 mg by mouth daily.   Yes [provider]  polyethylene glycol (MIRALAX / GLYCOLAX) 17 g packet Take 17 g by mouth daily as needed for mild  constipation.   Yes [provider]  potassium chloride SA (KLOR-CON) 20 MEQ tablet Take 40 mEq by mouth daily.   Yes [provider]  pramipexole (MIRAPEX) 0.125 MG tablet Take 0.125 mg by mouth at bedtime.   Yes [provider]  risperiDONE (RISPERDAL) 0.25 MG tablet Take 1 tablet (0.25 mg total) by mouth at bedtime. 09/28/19  Yes Danford, Suann Larry, MD  sennosides-docusate sodium (SENOKOT-S) 8.6-50 MG tablet Take 2 tablets by mouth daily as needed for constipation.   Yes [provider]  simethicone (MYLICON) 80 MG chewable tablet Chew 80 mg by mouth every 6 (six) hours as needed for flatulence.   Yes [provider]  tamsulosin (FLOMAX) 0.4 MG CAPS capsule Take 0.4 mg by mouth daily after supper.   Yes [provider]  traZODone (DESYREL) 100 MG tablet Take 100 mg by mouth at bedtime.   Yes [provider]  nitroGLYCERIN (NITROSTAT) 0.4 MG SL tablet Place 0.4 mg under the tongue every 5 (five) minutes x 3 doses as needed for chest pain (if no relief after 2nd dose, proceed to the ED for an evaluation or call 911).    [provider]      Allergies    Bactrim [sulfamethoxazole-trimethoprim], Sulfonamide derivatives, and Tetracycline    Review of Systems   Review of Systems  Unable to perform ROS: Dementia    Physical Exam Updated Vital Signs BP (!) 151/69 (BP Location: Right Arm)   Pulse 65   Temp 98.5 F (36.9 C) (Oral)   Resp 17   Ht '5\' 11"'$  (1.803 m)   Wt 85.7 kg   SpO2 96%   BMI 26.36 kg/m  Physical Exam Vitals and nursing note reviewed.  Constitutional:      General: He is not in acute distress.    Appearance: He is not ill-appearing.  HENT:     Head: Normocephalic and atraumatic.     Comments: There is no deformity of the head presents no raccoon eyes or battle sign noted, he has slight abrasion on the left parietal lobe.    Nose: No congestion.     Mouth/Throat:     Mouth: Mucous membranes are  moist.     Pharynx: Oropharynx is clear.     Comments: No trismus no torticollis no oral trauma present.  Eyes:     Extraocular Movements: Extraocular movements intact.     Conjunctiva/sclera: Conjunctivae normal.     Pupils: Pupils are equal, round, and reactive to light.  Cardiovascular:     Rate and Rhythm: Normal rate and regular rhythm.     Pulses: Normal pulses.     Heart sounds: No murmur heard.    No friction rub. No gallop.  Pulmonary:     Effort: No respiratory distress.     Breath sounds: No wheezing, rhonchi or rales.  Abdominal:     Palpations: Abdomen  is soft.     Tenderness: There is abdominal tenderness. There is no right CVA tenderness or left CVA tenderness.     Comments: Abdomen nondistended, soft, he had nonspecific abdominal pain mainly in his lower abdomen, no guarding rebound tenderness.  Musculoskeletal:     Comments: Back was palpated was nontender to palpation no step-off deformities noted, he had slight tenderness noted in the muscular surrounding his left iliac crest, no pelvis instability no leg shortening, he had no tenderness noted on his upper or lower extremities he had 2+ radial pulses bilaterally, 2+ dorsal pedal pulses.  Skin:    General: Skin is warm and dry.  Neurological:     Mental Status: He is alert.     Comments: No facial asymmetry, no slurring of his words, no unilateral weakness, able to follow basic commands.  Psychiatric:        Mood and Affect: Mood normal.     ED Results / Procedures / Treatments   Labs (all labs ordered are listed, but only abnormal results are displayed) Labs Reviewed  COMPREHENSIVE METABOLIC PANEL - Abnormal; Notable for the following components:      Result Value   Glucose, Bld 118 (*)    BUN 27 (*)    Creatinine, Ser 1.46 (*)    AST 12 (*)    Total Bilirubin 1.6 (*)    GFR, Estimated 45 (*)    All other components within normal limits  CBC WITH DIFFERENTIAL/PLATELET - Abnormal; Notable for the  following components:   WBC 13.7 (*)    Neutro Abs 10.6 (*)    All other components within normal limits  URINALYSIS, ROUTINE W REFLEX MICROSCOPIC  LIPASE, BLOOD    EKG EKG Interpretation  Date/Time:  Friday January 08 2022 12:49:33 EDT Ventricular Rate:  62 PR Interval:  354 QRS Duration: 120 QT Interval:  454 QTC Calculation: 460 R Axis:   -8 Text Interpretation: Sinus rhythm with 1st degree A-V block with occasional Premature ventricular complexes Inferior infarct , age undetermined Cannot rule out Anterior infarct , age undetermined Abnormal ECG When compared with ECG of 15-Jul-2021 00:49, No significant change since last tracing Confirmed by Dorie Rank [phone]) on 01/08/2022 6:28:26 PM  Radiology DG Chest 2 View  Result Date: 01/08/2022 CLINICAL DATA:  Follow-up airspace disease. EXAM: CHEST - 2 VIEW COMPARISON:  01/08/2022 FINDINGS: Mild cardiac enlargement. Previous median sternotomy and CABG. Mild diffuse increase interstitial markings concerning for interstitial edema. Bilateral lower lobe opacities are identified corresponding to the chest radiograph abnormality. Findings may represent atelectasis and or consolidation. The visualized osseous structures appear grossly intact. IMPRESSION: 1. Suspect mild diffuse edema. 2. Bilateral lower lobe opacities which may represent atelectasis and or consolidation. Electronically Signed   By: Kerby Moors M.D.   On: 01/08/2022 17:53   CT Head Wo Contrast  Result Date: 01/08/2022 CLINICAL DATA:  Head trauma, minor (Age >= 65y) EXAM: CT HEAD WITHOUT CONTRAST TECHNIQUE: Contiguous axial images were obtained from the base of the skull through the vertex without intravenous contrast. RADIATION DOSE REDUCTION: This exam was performed according to the departmental dose-optimization program which includes automated exposure control, adjustment of the mA and/or kV according to patient size and/or use of iterative reconstruction technique.  COMPARISON:  CT head 07/15/2021, CT head 02/14/2021 BRAIN: BRAIN Cerebral ventricle sizes are concordant with the degree of cerebral volume loss. Patchy and confluent areas of decreased attenuation are noted throughout the deep and periventricular white matter of the cerebral hemispheres  bilaterally, compatible with chronic microvascular ischemic disease. No evidence of large-territorial acute infarction. No parenchymal hemorrhage. No mass lesion. No extra-axial collection. No mass effect or midline shift. No hydrocephalus. Basilar cisterns are patent. Vascular: No hyperdense vessel. Atherosclerotic calcifications are present within the cavernous internal carotid arteries. Skull: No acute fracture or focal lesion. Sinuses/Orbits: Paranasal sinuses. Chronic partial right mastoid air cell effusion. No left mastoid air cell effusion. The orbits are unremarkable. Other: None. IMPRESSION: No acute intracranial abnormality. Electronically Signed   By: Iven Finn M.D.   On: 01/08/2022 17:25   CT Cervical Spine Wo Contrast  Result Date: 01/08/2022 CLINICAL DATA:  Unwitnessed fall this am, patient in Hospice. Patient c/o stiff neck. EXAM: CT HEAD WITHOUT CONTRAST CT CERVICAL SPINE WITHOUT CONTRAST TECHNIQUE: Multidetector CT imaging of the head and cervical spine was performed following the standard protocol without intravenous contrast. Multiplanar CT image reconstructions of the cervical spine were also generated. RADIATION DOSE REDUCTION: This exam was performed according to the departmental dose-optimization program which includes automated exposure control, adjustment of the mA and/or kV according to patient size and/or use of iterative reconstruction technique. COMPARISON:  None Available. FINDINGS: CT HEAD FINDINGS BRAIN: BRAIN Cerebral ventricle sizes are concordant with the degree of cerebral volume loss. Patchy and confluent areas of decreased attenuation are noted throughout the deep and periventricular  white matter of the cerebral hemispheres bilaterally, compatible with chronic microvascular ischemic disease. No evidence of large-territorial acute infarction. No parenchymal hemorrhage. No mass lesion. No extra-axial collection. No mass effect or midline shift. No hydrocephalus. Basilar cisterns are patent. Vascular: No hyperdense vessel. Atherosclerotic calcifications are present within the cavernous internal carotid arteries. Skull: No acute fracture or focal lesion. Sinuses/Orbits: Paranasal sinuses and mastoid air cells are clear. The orbits are unremarkable. Other: None. CT CERVICAL SPINE FINDINGS Alignment: Reversal of the normal cervical lordosis centered at the C4 level. Skull base and vertebrae: Multilevel moderate severe degenerative changes of the spine. No definite associated severe osseous neural foraminal stenosis or central canal stenosis. No acute fracture. No aggressive appearing focal osseous lesion or focal pathologic process. Soft tissues and spinal canal: No prevertebral fluid or swelling. No visible canal hematoma. Upper chest: Unremarkable. Other: None. IMPRESSION: 1. No acute intracranial abnormality. 2. No acute displaced fracture or traumatic listhesis of the cervical spine. Electronically Signed   By: Iven Finn M.D.   On: 01/08/2022 17:21   CT ABDOMEN PELVIS W CONTRAST  Result Date: 01/08/2022 CLINICAL DATA:  Abdominal pain, acute, nonlocalized. Unwitnessed fall this am, patient in Hospice. Patient c/o stiff neck. EXAM: CT ABDOMEN AND PELVIS WITH CONTRAST TECHNIQUE: Multidetector CT imaging of the abdomen and pelvis was performed using the standard protocol following bolus administration of intravenous contrast. RADIATION DOSE REDUCTION: This exam was performed according to the departmental dose-optimization program which includes automated exposure control, adjustment of the mA and/or kV according to patient size and/or use of iterative reconstruction technique. CONTRAST:   77m OMNIPAQUE IOHEXOL 300 MG/ML  SOLN COMPARISON:  CT abdomen pelvis 06/26/2014 report without imaging FINDINGS: Lower chest: Limited evaluation due to respiratory motion artifact. Bibasilar peribronchovascular airspace opacities. Mitral annular calcifications. Coronary artery calcifications. Hepatobiliary: Not enlarged. No focal lesion. No laceration or subcapsular hematoma. Cholelithiasis. No gallbladder wall thickening or pericholecystic fluid. No biliary ductal dilatation. Pancreas: Normal pancreatic contour. No main pancreatic duct dilatation. Spleen: Not enlarged. No focal lesion. No laceration, subcapsular hematoma, or vascular injury. Adrenals/Urinary Tract: No nodularity bilaterally. Bilateral kidneys enhance symmetrically. No hydronephrosis. No contusion,  laceration, or subcapsular hematoma. Fluid density lesions likely represent simple renal cysts. Simple renal cysts, in the absence of clinically indicated signs/symptoms, require no independent follow-up. No injury to the vascular structures or collecting systems. Vague hyperdensity at the left ureterovesicular junction on coronal view with associated fullness of the left collecting system with no frank hydroureteronephrosis. No nephrolithiasis bilaterally. No right nephrolithiasis. The urinary bladder is unremarkable. Stomach/Bowel: No small or large bowel wall thickening or dilatation. Colonic diverticulosis. The appendix is unremarkable. Vasculature/Lymphatics: Severe atherosclerotic plaque. No abdominal aorta or iliac aneurysm. No active contrast extravasation or pseudoaneurysm. No abdominal, pelvic, inguinal lymphadenopathy. Reproductive: Prostate is enlarged measuring up to 4.9 cm. Other: No simple free fluid ascites. No pneumoperitoneum. No hemoperitoneum. No mesenteric hematoma identified. No organized fluid collection. Musculoskeletal: Tiny fat containing umbilical hernia. No significant soft tissue hematoma. No acute pelvic fracture. Chronic  appearing left L1 through L3 transverse process fractures. No acute displaced spinal fracture. Ports and Devices: None. IMPRESSION: 1. No acute traumatic injury to the abdomen or pelvis. 2. No definite acute fracture or traumatic malalignment of the lumbar spine. 3. Chronic appearing left L1 through L3 transverse process fractures. Recommend correlation with point tenderness to palpation to evaluate for an acute component. Other imaging findings of potential clinical significance: 1. Bibasilar peribronchovascular airspace opacities-markedly limited evaluation due to respiratory motion artifact. Finding could represent a combination of atelectasis versus infection/inflammation. Recommend correlation with chest x-ray. 2. Question punctate left ureterovesicular junction stone with associated developing early/mild proximal collecting system obstruction. Consider correlation with urinalysis. 3. Cholelithiasis with no acute cholecystitis. 4. Colonic diverticulosis with no acute diverticulitis. 5. Prostatomegaly. Electronically Signed   By: Iven Finn M.D.   On: 01/08/2022 17:15    Procedures Procedures    Medications Ordered in ED Medications  iohexol (OMNIPAQUE) 300 MG/ML solution 80 mL (80 mLs Intravenous Contrast Given 01/08/22 1620)    ED Course/ Medical Decision Making/ A&P                           Medical Decision Making Amount and/or Complexity of Data Reviewed Labs: ordered. Radiology: ordered.  Risk Prescription drug management.   This patient presents to the ED for concern of fall, this involves an extensive number of treatment options, and is a complaint that carries with it a high risk of complications and morbidity.  The differential diagnosis includes intracranial head bleed, orthopedic injury, internal trauma    Additional history obtained:  Additional history obtained from nursing staff from nursing home External records from outside source obtained and reviewed including  cardiology notes   Co morbidities that complicate the patient evaluation  Congestive heart failure  Social Determinants of Health:  Geriatric    Lab Tests:  I Ordered, and personally interpreted labs.  The pertinent results include: CBC shows leukocytosis of 13.7, CMP shows glucose of 118, BUN of 27, creatinine 1.4 UA is unremarkable   Imaging Studies ordered:  I ordered imaging studies including CT head, C-spine, CT AP I independently visualized and interpreted imaging which showed CT head and C-spine negative for acute findings, CT AP reveals possible lower lobe pneumonia, questionable left ureter stone.  Chest x-ray shows atelectasis versus pneumonia I agree with the radiologist interpretation   Cardiac Monitoring:  The patient was maintained on a cardiac monitor.  I personally viewed and interpreted the cardiac monitored which showed an underlying rhythm of: Without signs of ischemia   Medicines ordered and prescription drug management:  I ordered medication including N/A I have reviewed the patients home medicines and have made adjustments as needed  Critical Interventions:  N/A   Reevaluation:   Patient some stomach pain during my exam, will obtain CT image of the head neck as well as abdomen and reassess.  Patient was reassessed, spine was repalpated there is no tenderness along his lumbar spine, I doubt he has an acute fracture at this time, chest x-ray for further evaluation of the lungs, and they show possible pneumonia bilaterally, when I spoke with the patient states he has been having a slight cough, will start him on antibiotics.  Consultations Obtained:  N/A    Test Considered:  N/A    Rule out low suspicion for intracranial head bleed as patient denies loss of conscious, is not on anticoagulant, she does not endorse headaches, paresthesia/weakness in the upper and lower extremities, no focal deficits present on my exam CT head negative.  Low  suspicion for spinal cord abnormality or spinal fracture spine was palpated was nontender to palpation, patient has full range of motion in the upper and lower extremities CT C-spine negative.  Low suspicion for intrathoracic/intra-abdominal trauma as chest was nontender during palpation, no evidence of traumatic injury seen on chest x-ray, CT abdomen pelvis is also negative for acute findings.  CT imaging reveals possible stone in the ureter I find this unlikely as she is endorsing any urinary symptoms, UA is unremarkable for hematuria or signs of infection, he has no flank tenderness.  Low suspicion for orthopedic injury as imaging is negative for acute findings.     Dispostion and problem list  After consideration of the diagnostic results and the patients response to treatment, I feel that the patent would benefit from discharge.  Pneumonia-this was seen on chest x-ray, he is endorsing a cough, he has a slight leukocytosis, likely from this, will start him on antibiotics, follow-up with PCP for further evaluation and strict return precautions. Chronic L1-L3 transverse fractures patient was made aware this, will help follow-up with PCP for further management.            Final Clinical Impression(s) / ED Diagnoses Final diagnoses:  Fall, initial encounter  Community acquired pneumonia, unspecified laterality    Rx / DC Orders ED Discharge Orders          Ordered    azithromycin (ZITHROMAX) 250 MG tablet  Daily        01/08/22 1829    amoxicillin-clavulanate (AUGMENTIN) 875-125 MG tablet  Every 12 hours        01/08/22 1829              Marcello Fennel, PA-C 01/08/22 1829    Sherwood Gambler, MD 01/15/22 [phone]

## 2022-01-22 IMAGING — DX DG CHEST 1V PORT
1 series · 1 of 1 positions shown · non-contrast
Comparison: 09/29/2019

CLINICAL DATA: Sepsis with weakness

EXAM:
PORTABLE CHEST 1 VIEW

[chest ap]
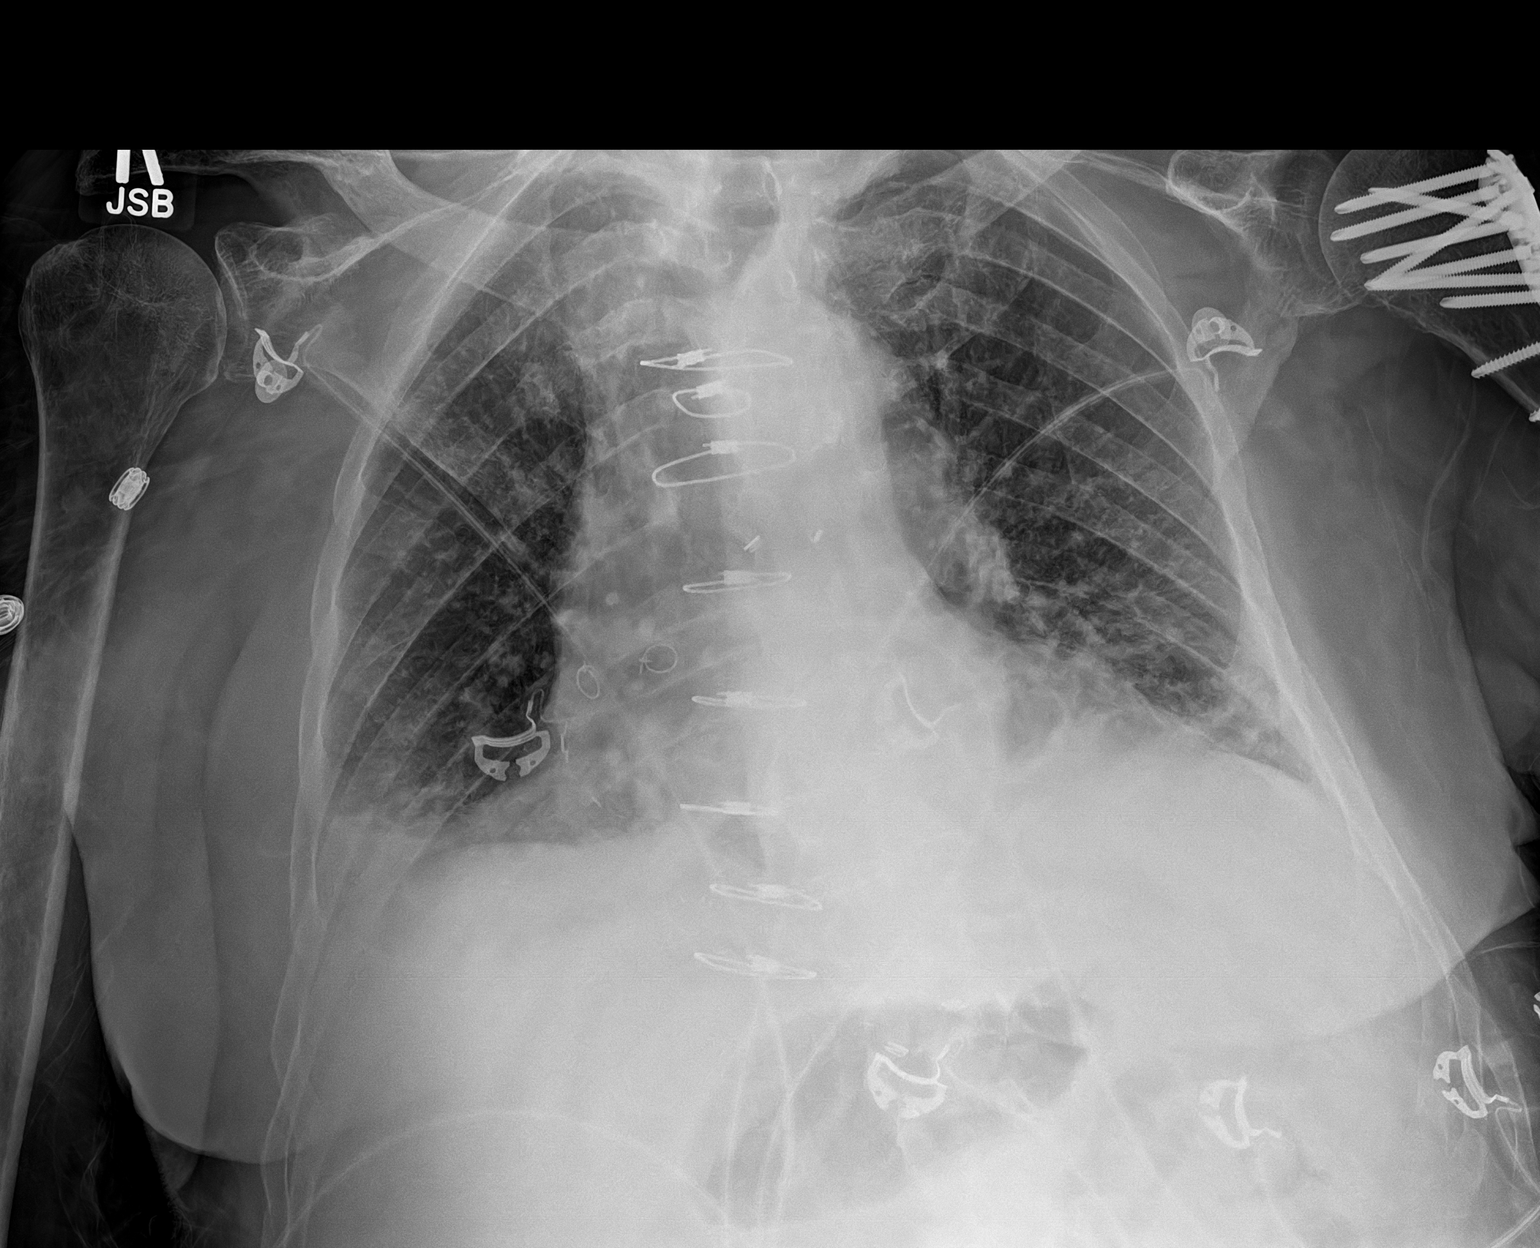

[1 of 1 positions shown; findings below may reference images not displayed]

FINDINGS: Left humeral hardware again seen. Postsurgical changes of CABG again
seen.

Unchanged mild cardiomegaly and mild pulmonary vascular congestion.
Unchanged small right pleural effusion with adjacent atelectasis.
There is new patchy opacities at the left lung base.
IMPRESSION: New patchy opacity at the left lung base may be due to atelectasis
or pneumonia. Unchanged small right pleural effusion.

## 2022-03-19 ENCOUNTER — Emergency Department (HOSPITAL_COMMUNITY)

## 2022-03-19 ENCOUNTER — Emergency Department (HOSPITAL_COMMUNITY)
Admission: EM | Admit: 2022-03-19 | Discharge: 2022-03-19 | Disposition: A | Discharge status: Skilled Nursing Facility | Attending: Emergency Medicine | Admitting: Emergency Medicine

## 2022-03-19 DIAGNOSIS — R109 Unspecified abdominal pain: Secondary | ICD-10-CM | POA: Insufficient documentation

## 2022-03-19 DIAGNOSIS — Z951 Presence of aortocoronary bypass graft: Secondary | ICD-10-CM | POA: Diagnosis not present

## 2022-03-19 DIAGNOSIS — W08XXXA Fall from other furniture, initial encounter: Secondary | ICD-10-CM | POA: Diagnosis not present

## 2022-03-19 DIAGNOSIS — I2510 Atherosclerotic heart disease of native coronary artery without angina pectoris: Secondary | ICD-10-CM | POA: Diagnosis not present

## 2022-03-19 DIAGNOSIS — N189 Chronic kidney disease, unspecified: Secondary | ICD-10-CM | POA: Diagnosis not present

## 2022-03-19 DIAGNOSIS — Z7982 Long term (current) use of aspirin: Secondary | ICD-10-CM | POA: Insufficient documentation

## 2022-03-19 DIAGNOSIS — W19XXXA Unspecified fall, initial encounter: Secondary | ICD-10-CM

## 2022-03-19 DIAGNOSIS — E1122 Type 2 diabetes mellitus with diabetic chronic kidney disease: Secondary | ICD-10-CM | POA: Insufficient documentation

## 2022-03-19 DIAGNOSIS — S0990XA Unspecified injury of head, initial encounter: Secondary | ICD-10-CM | POA: Diagnosis present

## 2022-03-19 DIAGNOSIS — I509 Heart failure, unspecified: Secondary | ICD-10-CM | POA: Diagnosis not present

## 2022-03-19 DIAGNOSIS — Z79899 Other long term (current) drug therapy: Secondary | ICD-10-CM | POA: Insufficient documentation

## 2022-03-19 DIAGNOSIS — S0093XA Contusion of unspecified part of head, initial encounter: Secondary | ICD-10-CM | POA: Insufficient documentation

## 2022-03-19 DIAGNOSIS — I130 Hypertensive heart and chronic kidney disease with heart failure and stage 1 through stage 4 chronic kidney disease, or unspecified chronic kidney disease: Secondary | ICD-10-CM | POA: Diagnosis not present

## 2022-03-19 LAB — BASIC METABOLIC PANEL
Anion gap: 9 (ref 5–15)
BUN: 24 mg/dL — ABNORMAL HIGH (ref 8–23)
CO2: 30 mmol/L (ref 22–32)
Calcium: 8.9 mg/dL (ref 8.9–10.3)
Chloride: 100 mmol/L (ref 98–111)
Creatinine, Ser: 1.41 mg/dL — ABNORMAL HIGH (ref 0.61–1.24)
GFR, Estimated: 46 mL/min — ABNORMAL LOW (ref >60)
Glucose, Bld: 116 mg/dL — ABNORMAL HIGH (ref 70–99)
Potassium: 4.8 mmol/L (ref 3.5–5.1)
Sodium: 139 mmol/L (ref 135–145)

## 2022-03-19 LAB — CBC
HCT: 37.8 % — ABNORMAL LOW (ref 39.0–52.0)
Hemoglobin: 12.5 g/dL — ABNORMAL LOW (ref 13.0–17.0)
MCH: 31 pg (ref 26.0–34.0)
MCHC: 33.1 g/dL (ref 30.0–36.0)
MCV: 93.8 fL (ref 80.0–100.0)
Platelets: 196 10*3/uL (ref 150–400)
RBC: 4.03 MIL/uL — ABNORMAL LOW (ref 4.22–5.81)
RDW: 12.7 % (ref 11.5–15.5)
WBC: 7.3 10*3/uL (ref 4.0–10.5)
nRBC: 0 % (ref 0.0–0.2)

## 2022-03-19 MED ORDER — IOHEXOL 300 MG/ML  SOLN
100.0000 mL | Freq: Once | INTRAMUSCULAR | Status: AC | PRN
  Administered 2022-03-19: 100 mL via INTRAVENOUS

## 2022-03-19 NOTE — ED Provider Notes (Signed)
[facility] EMERGENCY DEPARTMENT Provider Note   CSN: [id number] Arrival date & time: 03/19/22  1402     History  Chief Complaint  Patient presents with   [name] is a 88 y.o. male with history of TIA in 2007, CAD s/p CABG, aortic insufficiency, congestive heart failure, chronic kidney disease, diabetes, hyperlipidemia, hypertension, peripheral vascular disease, currently on hospice care who presents the emergency department after a fall.  Patient states that he was sitting in a recliner and leaned forward, the chair slipped out from underneath him and he fell forward, striking his head on the cement floor. He reports several recent falls this week. Patient is complaining of generalized body aches, no focal tenderness/pain reported. Denies LOC.   Contacted patient's nursing facility [facility], and they report that both of patient's falls this week were unwitnessed.  His fall earlier today they had to help get him up off the floor.  He reported to staff that he fell earlier this week as well, but was able to get himself up.   Fall Pertinent negatives include no headaches.       Home Medications Prior to Admission medications   Medication Sig Start Date End Date Taking? Authorizing Provider  acetaminophen (TYLENOL) 500 MG tablet Take 2 tablets by mouth every 8 (eight) hours as needed for pain or fever. 11/20/20   [provider]  albuterol (PROVENTIL) (2.5 MG/3ML) 0.083% nebulizer solution Take 2.5 mg by nebulization every 4 (four) hours as needed for wheezing or shortness of breath.    [provider]  albuterol (VENTOLIN HFA) 108 (90 Base) MCG/ACT inhaler Inhale 2 puffs into the lungs every 4 (four) hours as needed for wheezing or shortness of breath.    [provider]  aspirin EC 81 MG tablet Take 1 tablet (81 mg total) by mouth daily. 05/22/12   Carlena Bjornstad, MD  atorvastatin (LIPITOR) 20 MG tablet Take 1 tablet (20 mg  total) by mouth every morning. 09/15/21   Arnoldo Lenis, MD  azithromycin (ZITHROMAX) 250 MG tablet Take 1 tablet (250 mg total) by mouth daily. Take first 2 tablets together, then 1 every day until finished. 01/08/22   Marcello Fennel, PA-C  busPIRone (BUSPAR) 5 MG tablet Take 1 tablet by mouth 2 (two) times daily. 02/17/21   [provider]  citalopram (CELEXA) 20 MG tablet Take 20 mg by mouth daily.    [provider]  fluticasone (FLONASE) 50 MCG/ACT nasal spray Place 2 sprays into both nostrils daily.    [provider]  furosemide (LASIX) 40 MG tablet Take 1.5 tablets (60 mg total) by mouth in the morning AND 1 tablet (40 mg total) every evening. 09/15/21   Branch, Alphonse Guild, MD  gabapentin (NEURONTIN) 300 MG capsule Take 300 mg by mouth 2 (two) times daily.    [provider]  HYDROcodone-acetaminophen (NORCO/VICODIN) 5-325 MG tablet Take 1 tablet by mouth 2 (two) times daily.    [provider]  hydrOXYzine (ATARAX/VISTARIL) 25 MG tablet Take 25 mg by mouth every 6 (six) hours as needed for itching.    [provider]  lidocaine (HM LIDOCAINE PATCH) 4 % Place 2 patches onto the skin daily. Patient taking differently: Place 1 patch onto the skin daily. 12/15/21   Fransico Meadow, MD  loratadine (CLARITIN) 10 MG tablet Take 10 mg by mouth daily.    [provider]  LORazepam (ATIVAN) 0.5 MG tablet  Take 0.5 mg by mouth every 3 (three) hours as needed for anxiety.    [provider]  meclizine (ANTIVERT) 12.5 MG tablet Take 12.5 mg by mouth daily as needed for dizziness.    [provider]  metoprolol succinate (TOPROL-XL) 25 MG 24 hr tablet Take 25 mg by mouth daily.    [provider]  nitroGLYCERIN (NITROSTAT) 0.4 MG SL tablet Place 0.4 mg under the tongue every 5 (five) minutes x 3 doses as needed for chest pain (if no relief after 2nd dose, proceed to the ED for an evaluation or call 911).     [provider]  polyethylene glycol (MIRALAX / GLYCOLAX) 17 g packet Take 17 g by mouth daily as needed for mild constipation.    [provider]  potassium chloride SA (KLOR-CON) 20 MEQ tablet Take 40 mEq by mouth daily.    [provider]  pramipexole (MIRAPEX) 0.125 MG tablet Take 0.125 mg by mouth at bedtime.    [provider]  risperiDONE (RISPERDAL) 0.25 MG tablet Take 1 tablet (0.25 mg total) by mouth at bedtime. 09/28/19   Danford, Suann Larry, MD  sennosides-docusate sodium (SENOKOT-S) 8.6-50 MG tablet Take 2 tablets by mouth daily as needed for constipation.    [provider]  simethicone (MYLICON) 80 MG chewable tablet Chew 80 mg by mouth every 6 (six) hours as needed for flatulence.    [provider]  tamsulosin (FLOMAX) 0.4 MG CAPS capsule Take 0.4 mg by mouth daily after supper.    [provider]  traZODone (DESYREL) 100 MG tablet Take 100 mg by mouth at bedtime.    [provider]      Allergies    Bactrim [sulfamethoxazole-trimethoprim], Sulfonamide derivatives, and Tetracycline    Review of Systems   Review of Systems  Eyes:  Negative for visual disturbance.  Musculoskeletal:  Positive for arthralgias and myalgias.  Neurological:  Negative for syncope, weakness, light-headedness, numbness and headaches.  All other systems reviewed and are negative.   Physical Exam Updated Vital Signs BP (!) 146/70 (BP Location: Right Arm)   Pulse 65   Temp 98.3 F (36.8 C) (Oral)   Resp 16   SpO2 93%  Physical Exam Vitals and nursing note reviewed.  Constitutional:      Appearance: Normal appearance.  HENT:     Head: Normocephalic.     Comments: Contusions noted to the top of the head with superficial abrasion Eyes:     Conjunctiva/sclera: Conjunctivae normal.  Neck:     Comments: No cervical midline spinal tenderness, step-offs or crepitus.  No deformities noted to the extremities.  Chest wall and  pelvis stable.  Normal strength and sensation in all extremities. Cardiovascular:     Rate and Rhythm: Normal rate and regular rhythm.  Pulmonary:     Effort: Pulmonary effort is normal. No respiratory distress.     Breath sounds: Normal breath sounds.  Abdominal:     General: There is no distension.     Palpations: Abdomen is soft.     Tenderness: There is no abdominal tenderness.  Skin:    General: Skin is warm and dry.  Neurological:     General: No focal deficit present.     Mental Status: He is alert.     ED Results / Procedures / Treatments   Labs (all labs ordered are listed, but only abnormal results are displayed) Labs Reviewed  CBC - Abnormal; Notable for the following components:  Result Value   RBC 4.03 (*)    Hemoglobin 12.5 (*)    HCT 37.8 (*)    All other components within normal limits  BASIC METABOLIC PANEL - Abnormal; Notable for the following components:   Glucose, Bld 116 (*)    BUN 24 (*)    Creatinine, Ser 1.41 (*)    GFR, Estimated 46 (*)    All other components within normal limits    EKG None  Radiology CT Head Wo Contrast  Result Date: 03/19/2022 CLINICAL DATA:  88 year old male with head and neck injury from fall. EXAM: CT HEAD WITHOUT CONTRAST CT CERVICAL SPINE WITHOUT CONTRAST TECHNIQUE: Multidetector CT imaging of the head and cervical spine was performed following the standard protocol without intravenous contrast. Multiplanar CT image reconstructions of the cervical spine were also generated. RADIATION DOSE REDUCTION: This exam was performed according to the departmental dose-optimization program which includes automated exposure control, adjustment of the mA and/or kV according to patient size and/or use of iterative reconstruction technique. COMPARISON:  01/08/2022 and prior CTs FINDINGS: CT HEAD FINDINGS Brain: No evidence of acute infarction, hemorrhage, hydrocephalus, extra-axial collection or mass lesion/mass effect. Atrophy and  chronic small-vessel white matter ischemic changes again noted. Vascular: Carotid atherosclerotic calcifications are noted. Skull: Normal. Negative for fracture or focal lesion. Sinuses/Orbits: Fluid within the LEFT maxillary sinuses noted. Increasing RIGHT mastoid effusion identified with new fluid in the RIGHT middle and inner ears. Other: None CT CERVICAL SPINE FINDINGS Alignment: Reversal of the normal cervical lordosis again noted without evidence of subluxation. Skull base and vertebrae: No acute fracture. No primary bone lesion or focal pathologic process. Soft tissues and spinal canal: No prevertebral fluid or swelling. No visible canal hematoma. Disc levels: Moderate to severe multilevel degenerative disc disease/spondylosis again identified. Mild to moderate facet arthropathy again noted. Central spinal and bony foraminal narrowing at multiple levels are again identified. Upper chest: No acute abnormality. Other: None IMPRESSION: 1. No evidence of acute intracranial abnormality. Atrophy and chronic small-vessel white matter ischemic changes. 2. New fluid in the LEFT sphenoid sinus, question sinusitis. 3. Increasing RIGHT mastoid effusion with new fluid in the RIGHT middle and inner ears. Correlate clinically with infection/otitis. 4. No evidence of acute injury to the cervical spine. Multilevel degenerative changes again noted. Electronically Signed   By: Margarette Canada M.D.   On: 03/19/2022 16:53   CT Cervical Spine Wo Contrast  Result Date: 03/19/2022 CLINICAL DATA:  88 year old male with head and neck injury from fall. EXAM: CT HEAD WITHOUT CONTRAST CT CERVICAL SPINE WITHOUT CONTRAST TECHNIQUE: Multidetector CT imaging of the head and cervical spine was performed following the standard protocol without intravenous contrast. Multiplanar CT image reconstructions of the cervical spine were also generated. RADIATION DOSE REDUCTION: This exam was performed according to the departmental dose-optimization  program which includes automated exposure control, adjustment of the mA and/or kV according to patient size and/or use of iterative reconstruction technique. COMPARISON:  01/08/2022 and prior CTs FINDINGS: CT HEAD FINDINGS Brain: No evidence of acute infarction, hemorrhage, hydrocephalus, extra-axial collection or mass lesion/mass effect. Atrophy and chronic small-vessel white matter ischemic changes again noted. Vascular: Carotid atherosclerotic calcifications are noted. Skull: Normal. Negative for fracture or focal lesion. Sinuses/Orbits: Fluid within the LEFT maxillary sinuses noted. Increasing RIGHT mastoid effusion identified with new fluid in the RIGHT middle and inner ears. Other: None CT CERVICAL SPINE FINDINGS Alignment: Reversal of the normal cervical lordosis again noted without evidence of subluxation. Skull base and vertebrae: No acute  fracture. No primary bone lesion or focal pathologic process. Soft tissues and spinal canal: No prevertebral fluid or swelling. No visible canal hematoma. Disc levels: Moderate to severe multilevel degenerative disc disease/spondylosis again identified. Mild to moderate facet arthropathy again noted. Central spinal and bony foraminal narrowing at multiple levels are again identified. Upper chest: No acute abnormality. Other: None IMPRESSION: 1. No evidence of acute intracranial abnormality. Atrophy and chronic small-vessel white matter ischemic changes. 2. New fluid in the LEFT sphenoid sinus, question sinusitis. 3. Increasing RIGHT mastoid effusion with new fluid in the RIGHT middle and inner ears. Correlate clinically with infection/otitis. 4. No evidence of acute injury to the cervical spine. Multilevel degenerative changes again noted. Electronically Signed   By: Margarette Canada M.D.   On: 03/19/2022 16:53   CT CHEST ABDOMEN PELVIS W CONTRAST  Result Date: 03/19/2022 CLINICAL DATA:  88 year old male with chest, abdominal and pelvic pain following fall. EXAM: CT  CHEST, ABDOMEN, AND PELVIS WITH CONTRAST TECHNIQUE: Multidetector CT imaging of the chest, abdomen and pelvis was performed following the standard protocol during bolus administration of intravenous contrast. RADIATION DOSE REDUCTION: This exam was performed according to the departmental dose-optimization program which includes automated exposure control, adjustment of the mA and/or kV according to patient size and/or use of iterative reconstruction technique. CONTRAST:  13m OMNIPAQUE IOHEXOL 300 MG/ML  SOLN COMPARISON:  12/15/2021 chest CT, 01/08/2022 abdominal and pelvic CT and prior studies FINDINGS: CT CHEST FINDINGS Cardiovascular: Cardiomegaly and CABG changes again noted. Coronary artery and aortic atherosclerotic calcifications again identified. Unchanged ascending thoracic aortic aneurysm measuring 4 cm in greatest diameter. There is no evidence of pericardial effusion. Mediastinum/Nodes: No enlarged mediastinal, hilar, or axillary lymph nodes. Thyroid gland, trachea, and esophagus demonstrate no significant findings. Lungs/Pleura: Mild to moderate bibasilar atelectasis/scarring again noted. No new pulmonary opacities are identified. There is no evidence of pneumothorax or pleural effusion. Musculoskeletal: No acute or suspicious bony abnormalities are noted. Remote rib fractures again identified. CT ABDOMEN PELVIS FINDINGS Hepatobiliary: Mild hepatic steatosis again noted. Cholelithiasis again identified without CT evidence of acute cholecystitis. There is no evidence of intrahepatic or extrahepatic biliary dilatation. Pancreas: Unremarkable Spleen: Unremarkable Adrenals/Urinary Tract: Bilateral renal cortical thinning and fullness of the LEFT renal collecting system an ureter are again identified. Bilateral renal cysts are again noted and no follow-up imaging recommended. The adrenal glands and bladder are unremarkable. Stomach/Bowel: Stomach is within normal limits. Appendix appears normal. No evidence  of bowel wall thickening, distention, or inflammatory changes. A large amount of stool within the rectum is noted. Vascular/Lymphatic: Aortic atherosclerosis. No enlarged abdominal or pelvic lymph nodes. Reproductive: Prostatomegaly again identified. Other: No ascites, focal collection or pneumoperitoneum. Musculoskeletal: No acute or suspicious bony abnormalities are noted. Degenerative changes in the lumbar spine again identified. IMPRESSION: 1. No evidence of acute abnormality within the chest, abdomen or pelvis. 2. Large amount of stool within the rectum. 3. Unchanged ascending thoracic aortic aneurysm measuring 4 cm in greatest diameter. Recommend follow-up every 12 months. 4. Bibasilar atelectasis/scarring again noted. 5. Cholelithiasis without CT evidence of acute cholecystitis. 6. Mild hepatic steatosis. 7.  Aortic Atherosclerosis (ICD10-I70.0). Electronically Signed   By: JMargarette CanadaM.D.   On: 03/19/2022 16:44    Procedures Procedures    Medications Ordered in ED Medications  iohexol (OMNIPAQUE) 300 MG/ML solution 100 mL (100 mLs Intravenous Contrast Given 03/19/22 1610)    ED Course/ Medical Decision Making/ A&P  Medical Decision Making Amount and/or Complexity of Data Reviewed Labs: ordered. Radiology: ordered.  This patient is a 88 y.o. male  who presents to the ED for concern of mechanical unwitnessed fall from nursing facility.   Past Medical History / Co-morbidities: TIA in 2007, CAD s/p CABG, aortic insufficiency, congestive heart failure, chronic kidney disease, diabetes, hyperlipidemia, hypertension, peripheral vascular disease, currently on hospice care  Additional history: Chart reviewed. Pertinent results include: Lives at [facility]  Physical Exam: Physical exam performed. The pertinent findings include: Hypertensive, but otherwise normal vital signs. Contusions noted to the top of the head.  Lab Tests/Imaging studies: I  personally interpreted labs/imaging and the pertinent results include: No leukocytosis, stable hemoglobin.  BMP at baseline.  CT head, cervical spine, chest/abdomen/pelvis without acute traumatic etiology. I agree with the radiologist interpretation.  Disposition: After consideration of the diagnostic results and the patients response to treatment, I feel that emergency department workup does not suggest an emergent condition requiring admission or immediate intervention beyond what has been performed at this time. The plan is: discharge to nursing facility. No acute traumatic injury requiring further evaluation and hospitalization. The patient is safe for discharge and has been instructed to return immediately for worsening symptoms, change in symptoms or any other concerns.  Final Clinical Impression(s) / ED Diagnoses Final diagnoses:  Fall, initial encounter  Injury of head, initial encounter    Rx / DC Orders ED Discharge Orders     None      Portions of this report may have been transcribed using voice recognition software. Every effort was made to ensure accuracy; however, inadvertent computerized transcription errors may be present.    Estill Cotta 03/19/22 1715    Milton Ferguson, MD 03/20/22 [phone]

## 2022-03-19 NOTE — Discharge Instructions (Signed)
You were seen in the emergency department after a fall with head trauma.  Your lab work and imaging all looked reassuring today.   Continue to monitor how you're doing and return to the ER for new or worsening symptoms.

## 2022-03-19 NOTE — ED Triage Notes (Signed)
Pt via [facility] from [facility] after mechanical fall; pt slid out of recliner into floor. He has an abrasion to right forehead; no blood thinners reported. Pt has DNR form. A/O to baseline. Pain currently rated 3/10.

## 2022-03-24 ENCOUNTER — Ambulatory Visit: Payer: Medicare Other | Admitting: Cardiology

## 2022-04-09 ENCOUNTER — Encounter (HOSPITAL_COMMUNITY): Payer: Self-pay

## 2022-04-09 ENCOUNTER — Other Ambulatory Visit: Payer: Self-pay

## 2022-04-09 ENCOUNTER — Emergency Department (HOSPITAL_COMMUNITY)

## 2022-04-09 ENCOUNTER — Emergency Department (HOSPITAL_COMMUNITY)
Admission: EM | Admit: 2022-04-09 | Discharge: 2022-04-09 | Disposition: A | Discharge status: Skilled Nursing Facility | Attending: Emergency Medicine | Admitting: Emergency Medicine

## 2022-04-09 DIAGNOSIS — Z7982 Long term (current) use of aspirin: Secondary | ICD-10-CM | POA: Diagnosis not present

## 2022-04-09 DIAGNOSIS — F0390 Unspecified dementia without behavioral disturbance: Secondary | ICD-10-CM | POA: Insufficient documentation

## 2022-04-09 DIAGNOSIS — S0101XA Laceration without foreign body of scalp, initial encounter: Secondary | ICD-10-CM | POA: Insufficient documentation

## 2022-04-09 DIAGNOSIS — W19XXXA Unspecified fall, initial encounter: Secondary | ICD-10-CM | POA: Diagnosis not present

## 2022-04-09 DIAGNOSIS — S0990XA Unspecified injury of head, initial encounter: Secondary | ICD-10-CM | POA: Diagnosis present

## 2022-04-09 NOTE — ED Notes (Signed)
Pt returned from ct. No changes.  

## 2022-04-09 NOTE — ED Provider Notes (Signed)
[facility] EMERGENCY DEPARTMENT Provider Note   CSN: [id number] Arrival date & time: 04/09/22  1225     History {Add pertinent medical, surgical, social history, OB history to HPI:1} Chief Complaint  Patient presents with   [name] is a 88 y.o. male.  Patient fell and hit his head.  Patient has a history of dementia.   Fall       Home Medications Prior to Admission medications   Medication Sig Start Date End Date Taking? Authorizing Provider  acetaminophen (TYLENOL) 500 MG tablet Take 2 tablets by mouth every 8 (eight) hours as needed for pain or fever. 11/20/20   [provider]  albuterol (PROVENTIL) (2.5 MG/3ML) 0.083% nebulizer solution Take 2.5 mg by nebulization every 4 (four) hours as needed for wheezing or shortness of breath.    [provider]  albuterol (VENTOLIN HFA) 108 (90 Base) MCG/ACT inhaler Inhale 2 puffs into the lungs every 4 (four) hours as needed for wheezing or shortness of breath.    [provider]  aspirin EC 81 MG tablet Take 1 tablet (81 mg total) by mouth daily. 05/22/12   Carlena Bjornstad, MD  atorvastatin (LIPITOR) 20 MG tablet Take 1 tablet (20 mg total) by mouth every morning. 09/15/21   Arnoldo Lenis, MD  azithromycin (ZITHROMAX) 250 MG tablet Take 1 tablet (250 mg total) by mouth daily. Take first 2 tablets together, then 1 every day until finished. 01/08/22   Marcello Fennel, PA-C  busPIRone (BUSPAR) 5 MG tablet Take 1 tablet by mouth 2 (two) times daily. 02/17/21   [provider]  citalopram (CELEXA) 20 MG tablet Take 20 mg by mouth daily.    [provider]  fluticasone (FLONASE) 50 MCG/ACT nasal spray Place 2 sprays into both nostrils daily.    [provider]  furosemide (LASIX) 40 MG tablet Take 1.5 tablets (60 mg total) by mouth in the morning AND 1 tablet (40 mg total) every evening. 09/15/21   Branch, Alphonse Guild, MD  gabapentin (NEURONTIN) 300 MG capsule Take  300 mg by mouth 2 (two) times daily.    [provider]  HYDROcodone-acetaminophen (NORCO/VICODIN) 5-325 MG tablet Take 1 tablet by mouth 2 (two) times daily.    [provider]  hydrOXYzine (ATARAX/VISTARIL) 25 MG tablet Take 25 mg by mouth every 6 (six) hours as needed for itching.    [provider]  lidocaine (HM LIDOCAINE PATCH) 4 % Place 2 patches onto the skin daily. Patient taking differently: Place 1 patch onto the skin daily. 12/15/21   Fransico Meadow, MD  loratadine (CLARITIN) 10 MG tablet Take 10 mg by mouth daily.    [provider]  LORazepam (ATIVAN) 0.5 MG tablet Take 0.5 mg by mouth every 3 (three) hours as needed for anxiety.    [provider]  meclizine (ANTIVERT) 12.5 MG tablet Take 12.5 mg by mouth daily as needed for dizziness.    [provider]  metoprolol succinate (TOPROL-XL) 25 MG 24 hr tablet Take 25 mg by mouth daily.    [provider]  nitroGLYCERIN (NITROSTAT) 0.4 MG SL tablet Place 0.4 mg under the tongue every 5 (five) minutes x 3 doses as needed for chest pain (if no relief after 2nd dose, proceed to the ED for an evaluation or call 911).    [provider]  polyethylene glycol (MIRALAX / GLYCOLAX) 17 g packet Take 17 g by mouth daily as  needed for mild constipation.    [provider]  potassium chloride SA (KLOR-CON) 20 MEQ tablet Take 40 mEq by mouth daily.    [provider]  pramipexole (MIRAPEX) 0.125 MG tablet Take 0.125 mg by mouth at bedtime.    [provider]  risperiDONE (RISPERDAL) 0.25 MG tablet Take 1 tablet (0.25 mg total) by mouth at bedtime. 09/28/19   Danford, Suann Larry, MD  sennosides-docusate sodium (SENOKOT-S) 8.6-50 MG tablet Take 2 tablets by mouth daily as needed for constipation.    [provider]  simethicone (MYLICON) 80 MG chewable tablet Chew 80 mg by mouth every 6 (six) hours as needed for flatulence.    [provider]  tamsulosin (FLOMAX) 0.4 MG CAPS capsule Take 0.4 mg by mouth daily after supper.    [provider]  traZODone (DESYREL) 100 MG tablet Take 100 mg by mouth at bedtime.    [provider]      Allergies    Bactrim [sulfamethoxazole-trimethoprim], Sulfonamide derivatives, and Tetracycline    Review of Systems   Review of Systems  Physical Exam Updated Vital Signs BP (!) 146/62   Pulse 61   Temp 97.9 F (36.6 C) (Oral)   Resp 18   Ht '5\' 11"'$  (1.803 m)   Wt 85.7 kg   SpO2 100%   BMI 26.36 kg/m  Physical Exam  ED Results / Procedures / Treatments   Labs (all labs ordered are listed, but only abnormal results are displayed) Labs Reviewed - No data to display  EKG None  Radiology CT Head Wo Contrast  Result Date: 04/09/2022 CLINICAL DATA:  Head trauma, minor (Age >= 65y), fall EXAM: CT HEAD WITHOUT CONTRAST TECHNIQUE: Contiguous axial images were obtained from the base of the skull through the vertex without intravenous contrast. RADIATION DOSE REDUCTION: This exam was performed according to the departmental dose-optimization program which includes automated exposure control, adjustment of the mA and/or kV according to patient size and/or use of iterative reconstruction technique. COMPARISON:  03/19/2022 FINDINGS: Brain: No evidence of acute infarction, hemorrhage, hydrocephalus, extra-axial collection or mass lesion/mass effect. Patchy low-density changes within the periventricular and subcortical white matter compatible with chronic microvascular ischemic change. Mild-moderate diffuse cerebral volume loss. Vascular: Atherosclerotic calcifications involving the large vessels of the skull base. No unexpected hyperdense vessel. Skull: Normal. Negative for fracture or focal lesion. Sinuses/Orbits: Chronic partial right mastoid effusion. Mild mucosal thickening in the left maxillary sinus, improved from prior. Other: Soft tissue swelling of the left  occipital scalp with multiple skin staples. IMPRESSION: 1. No acute intracranial abnormality. 2. Soft tissue swelling of the left occipital scalp with multiple skin staples. No underlying calvarial fracture. 3. Chronic microvascular ischemic change and cerebral volume loss. Electronically Signed   By: Davina Poke D.O.   On: 04/09/2022 13:24    Procedures Procedures  {Document cardiac monitor, telemetry assessment procedure when appropriate:1}  Medications Ordered in ED Medications - No data to display  ED Course/ Medical Decision Making/ A&P  Patient with 3 lacerations to his scalp.  They are closed with staples                         Medical Decision Making Amount and/or Complexity of Data Reviewed Radiology: ordered.  Laceration to scalp  {Document critical care time when appropriate:1} {Document review of labs and clinical decision tools ie heart score, Chads2Vasc2 etc:1}  {Document your independent review of radiology images, and any outside  records:1} {Document your discussion with family members, caretakers, and with consultants:1} {Document social determinants of health affecting pt's care:1} {Document your decision making why or why not admission, treatments were needed:1} Final Clinical Impression(s) / ED Diagnoses Final diagnoses:  Fall, initial encounter    Rx / DC Orders ED Discharge Orders     None

## 2022-04-09 NOTE — ED Notes (Signed)
See triage notes. Ice applied and pressure dressing applied. Large hematoma noted to back left of head. Pupils perrla.

## 2022-04-09 NOTE — ED Triage Notes (Signed)
Patient to ED via EMS with reports of unwitnessed fall. Staff believe that patient struck head on screw in rail of bed. EMS reports that bleeding is not controlled and he has saturated two ABD pads. Has Dementia at baseline. Reports headache.States that it is a funny pain. Three lacerations to back of head noted.

## 2022-04-09 NOTE — Discharge Instructions (Signed)
Staples out in a week.  Clean area once a day gently with soap and water

## 2022-04-09 NOTE — ED Notes (Signed)
In with dr zammit to staple. Areas cleaned with betadine and stapled. Bleeding controlled at this time. Pressure dressing applied per vo dr zammit, read back and verified. No neuro status change. Nad.

## 2022-04-09 NOTE — ED Notes (Signed)
Pt placed on 02 2l [place] due to sats 89-90% ra

## 2022-06-27 ENCOUNTER — Other Ambulatory Visit: Payer: Self-pay

## 2022-06-27 ENCOUNTER — Emergency Department (HOSPITAL_COMMUNITY)

## 2022-06-27 ENCOUNTER — Emergency Department (HOSPITAL_COMMUNITY)
Admission: EM | Admit: 2022-06-27 | Discharge: 2022-06-27 | Disposition: A | Discharge status: Skilled Nursing Facility | Attending: Emergency Medicine | Admitting: Emergency Medicine

## 2022-06-27 ENCOUNTER — Encounter (HOSPITAL_COMMUNITY): Payer: Self-pay

## 2022-06-27 DIAGNOSIS — N189 Chronic kidney disease, unspecified: Secondary | ICD-10-CM | POA: Insufficient documentation

## 2022-06-27 DIAGNOSIS — I5040 Unspecified combined systolic (congestive) and diastolic (congestive) heart failure: Secondary | ICD-10-CM | POA: Insufficient documentation

## 2022-06-27 DIAGNOSIS — R519 Headache, unspecified: Secondary | ICD-10-CM | POA: Diagnosis present

## 2022-06-27 DIAGNOSIS — Z951 Presence of aortocoronary bypass graft: Secondary | ICD-10-CM | POA: Insufficient documentation

## 2022-06-27 DIAGNOSIS — S161XXA Strain of muscle, fascia and tendon at neck level, initial encounter: Secondary | ICD-10-CM | POA: Insufficient documentation

## 2022-06-27 DIAGNOSIS — S72002A Fracture of unspecified part of neck of left femur, initial encounter for closed fracture: Secondary | ICD-10-CM | POA: Diagnosis not present

## 2022-06-27 DIAGNOSIS — Z87891 Personal history of nicotine dependence: Secondary | ICD-10-CM | POA: Diagnosis not present

## 2022-06-27 DIAGNOSIS — R296 Repeated falls: Secondary | ICD-10-CM | POA: Diagnosis not present

## 2022-06-27 DIAGNOSIS — I130 Hypertensive heart and chronic kidney disease with heart failure and stage 1 through stage 4 chronic kidney disease, or unspecified chronic kidney disease: Secondary | ICD-10-CM | POA: Insufficient documentation

## 2022-06-27 DIAGNOSIS — Z7982 Long term (current) use of aspirin: Secondary | ICD-10-CM | POA: Insufficient documentation

## 2022-06-27 DIAGNOSIS — W01198A Fall on same level from slipping, tripping and stumbling with subsequent striking against other object, initial encounter: Secondary | ICD-10-CM | POA: Insufficient documentation

## 2022-06-27 DIAGNOSIS — W19XXXA Unspecified fall, initial encounter: Secondary | ICD-10-CM

## 2022-06-27 DIAGNOSIS — F0390 Unspecified dementia without behavioral disturbance: Secondary | ICD-10-CM | POA: Diagnosis not present

## 2022-06-27 DIAGNOSIS — I2510 Atherosclerotic heart disease of native coronary artery without angina pectoris: Secondary | ICD-10-CM | POA: Diagnosis not present

## 2022-06-27 DIAGNOSIS — S0990XA Unspecified injury of head, initial encounter: Secondary | ICD-10-CM | POA: Diagnosis not present

## 2022-06-27 LAB — COMPREHENSIVE METABOLIC PANEL
ALT: 10 U/L (ref 0–44)
AST: 16 U/L (ref 15–41)
Albumin: 3.8 g/dL (ref 3.5–5.0)
Alkaline Phosphatase: 70 U/L (ref 38–126)
Anion gap: 9 (ref 5–15)
BUN: 20 mg/dL (ref 8–23)
CO2: 29 mmol/L (ref 22–32)
Calcium: 8.7 mg/dL — ABNORMAL LOW (ref 8.9–10.3)
Chloride: 100 mmol/L (ref 98–111)
Creatinine, Ser: 1.18 mg/dL (ref 0.61–1.24)
GFR, Estimated: 58 mL/min — ABNORMAL LOW (ref >60)
Glucose, Bld: 131 mg/dL — ABNORMAL HIGH (ref 70–99)
Potassium: 3.8 mmol/L (ref 3.5–5.1)
Sodium: 138 mmol/L (ref 135–145)
Total Bilirubin: 1.6 mg/dL — ABNORMAL HIGH (ref 0.3–1.2)
Total Protein: 7.4 g/dL (ref 6.5–8.1)

## 2022-06-27 LAB — URINALYSIS, ROUTINE W REFLEX MICROSCOPIC
Bilirubin Urine: NEGATIVE
Glucose, UA: NEGATIVE mg/dL
Hgb urine dipstick: NEGATIVE
Ketones, ur: NEGATIVE mg/dL
Leukocytes,Ua: NEGATIVE
Nitrite: NEGATIVE
Protein, ur: NEGATIVE mg/dL
Specific Gravity, Urine: 1.01 (ref 1.005–1.030)
pH: 7 (ref 5.0–8.0)

## 2022-06-27 LAB — CBC WITH DIFFERENTIAL/PLATELET
Abs Immature Granulocytes: 0.02 10*3/uL (ref 0.00–0.07)
Basophils Absolute: 0.1 10*3/uL (ref 0.0–0.1)
Basophils Relative: 1 %
Eosinophils Absolute: 0.3 10*3/uL (ref 0.0–0.5)
Eosinophils Relative: 3 %
HCT: 37 % — ABNORMAL LOW (ref 39.0–52.0)
Hemoglobin: 11.9 g/dL — ABNORMAL LOW (ref 13.0–17.0)
Immature Granulocytes: 0 %
Lymphocytes Relative: 11 %
Lymphs Abs: 1.2 10*3/uL (ref 0.7–4.0)
MCH: 29.8 pg (ref 26.0–34.0)
MCHC: 32.2 g/dL (ref 30.0–36.0)
MCV: 92.5 fL (ref 80.0–100.0)
Monocytes Absolute: 1 10*3/uL (ref 0.1–1.0)
Monocytes Relative: 9 %
Neutro Abs: 8.3 10*3/uL — ABNORMAL HIGH (ref 1.7–7.7)
Neutrophils Relative %: 76 %
Platelets: 185 10*3/uL (ref 150–400)
RBC: 4 MIL/uL — ABNORMAL LOW (ref 4.22–5.81)
RDW: 13 % (ref 11.5–15.5)
WBC: 10.9 10*3/uL — ABNORMAL HIGH (ref 4.0–10.5)
nRBC: 0 % (ref 0.0–0.2)

## 2022-06-27 LAB — BRAIN NATRIURETIC PEPTIDE: B Natriuretic Peptide: 704 pg/mL — ABNORMAL HIGH (ref 0.0–100.0)

## 2022-06-27 MED ORDER — HYDROMORPHONE HCL 1 MG/ML IJ SOLN
0.5000 mg | Freq: Once | INTRAMUSCULAR | Status: AC
  Administered 2022-06-27: 0.5 mg via INTRAVENOUS
  Filled 2022-06-27: qty 0.5

## 2022-06-27 MED ORDER — SODIUM CHLORIDE 0.9 % IV SOLN: INTRAVENOUS | Status: DC

## 2022-06-27 NOTE — ED Notes (Signed)
Pt having tenderness in left pelvis area to palpation. Patient also complains of generalized HA. No noticeable shorting to the left leg and able to feel pedal pulse.

## 2022-06-27 NOTE — ED Provider Notes (Addendum)
[facility] Provider Note   CSN: [id number] Arrival date & time: 06/27/22  1041     History  Chief Complaint  Patient presents with   [name] is a 89 y.o. male.  From [place].  Patient is a DNR.  Patient is has a history of multiple falls.  Patient states he hit his head and is having a headache.  Did not lose consciousness.  Not complaining of any other complaints.  However nursing facility did mention that he was complaining of left thigh pain.  Will go ahead and x-ray that area although patient denying that to me.  No evidence of any bleeding or any direct evidence of trauma.  Patient does state that his neck hurts but it hurts all the time.  History of CABG history of best best this exposure history of kidney stones history of peripheral vascular disease diabetes hypertension coronary artery disease TIAs aortic insufficiency chronic kidney disease combined systolic diastolic congestive heart failure.  Patient is a former smoker quit in 1982.       Home Medications Prior to Admission medications   Medication Sig Start Date End Date Taking? Authorizing Provider  acetaminophen (TYLENOL) 500 MG tablet Take 2 tablets by mouth every 8 (eight) hours as needed for pain or fever. 11/20/20   [provider]  albuterol (PROVENTIL) (2.5 MG/3ML) 0.083% nebulizer solution Take 2.5 mg by nebulization every 4 (four) hours as needed for wheezing or shortness of breath.    [provider]  albuterol (VENTOLIN HFA) 108 (90 Base) MCG/ACT inhaler Inhale 2 puffs into the lungs every 4 (four) hours as needed for wheezing or shortness of breath.    [provider]  aspirin EC 81 MG tablet Take 1 tablet (81 mg total) by mouth daily. 05/22/12   Carlena Bjornstad, MD  atorvastatin (LIPITOR) 20 MG tablet Take 1 tablet (20 mg total) by mouth every morning. 09/15/21   Arnoldo Lenis, MD  azithromycin  (ZITHROMAX) 250 MG tablet Take 1 tablet (250 mg total) by mouth daily. Take first 2 tablets together, then 1 every day until finished. 01/08/22   Marcello Fennel, PA-C  busPIRone (BUSPAR) 5 MG tablet Take 1 tablet by mouth 2 (two) times daily. 02/17/21   [provider]  citalopram (CELEXA) 20 MG tablet Take 20 mg by mouth daily.    [provider]  fluticasone (FLONASE) 50 MCG/ACT nasal spray Place 2 sprays into both nostrils daily.    [provider]  furosemide (LASIX) 40 MG tablet Take 1.5 tablets (60 mg total) by mouth in the morning AND 1 tablet (40 mg total) every evening. 09/15/21   Branch, Alphonse Guild, MD  gabapentin (NEURONTIN) 300 MG capsule Take 300 mg by mouth 2 (two) times daily.    [provider]  HYDROcodone-acetaminophen (NORCO/VICODIN) 5-325 MG tablet Take 1 tablet by mouth 2 (two) times daily.    [provider]  hydrOXYzine (ATARAX/VISTARIL) 25 MG tablet Take 25 mg by mouth every 6 (six) hours as needed for itching.    [provider]  lidocaine (HM LIDOCAINE PATCH) 4 % Place 2 patches onto the skin daily. Patient taking differently: Place 1 patch onto the skin daily. 12/15/21   Fransico Meadow, MD  loratadine (CLARITIN) 10 MG tablet Take 10 mg by mouth daily.    [provider]  LORazepam (ATIVAN) 0.5 MG tablet Take 0.5 mg by mouth  every 3 (three) hours as needed for anxiety.    [provider]  meclizine (ANTIVERT) 12.5 MG tablet Take 12.5 mg by mouth daily as needed for dizziness.    [provider]  metoprolol succinate (TOPROL-XL) 25 MG 24 hr tablet Take 25 mg by mouth daily.    [provider]  nitroGLYCERIN (NITROSTAT) 0.4 MG SL tablet Place 0.4 mg under the tongue every 5 (five) minutes x 3 doses as needed for chest pain (if no relief after 2nd dose, proceed to the ED for an evaluation or call 911).    [provider]  polyethylene glycol (MIRALAX / GLYCOLAX) 17 g packet  Take 17 g by mouth daily as needed for mild constipation.    [provider]  potassium chloride SA (KLOR-CON) 20 MEQ tablet Take 40 mEq by mouth daily.    [provider]  pramipexole (MIRAPEX) 0.125 MG tablet Take 0.125 mg by mouth at bedtime.    [provider]  risperiDONE (RISPERDAL) 0.25 MG tablet Take 1 tablet (0.25 mg total) by mouth at bedtime. 09/28/19   Danford, Suann Larry, MD  sennosides-docusate sodium (SENOKOT-S) 8.6-50 MG tablet Take 2 tablets by mouth daily as needed for constipation.    [provider]  simethicone (MYLICON) 80 MG chewable tablet Chew 80 mg by mouth every 6 (six) hours as needed for flatulence.    [provider]  tamsulosin (FLOMAX) 0.4 MG CAPS capsule Take 0.4 mg by mouth daily after supper.    [provider]  traZODone (DESYREL) 100 MG tablet Take 100 mg by mouth at bedtime.    [provider]      Allergies    Bactrim [sulfamethoxazole-trimethoprim], Sulfonamide derivatives, and Tetracycline    Review of Systems   Review of Systems  Unable to perform ROS: Dementia    Physical Exam Updated Vital Signs BP (!) 111/59   Pulse (!) 47   Temp 98.3 F (36.8 C) (Oral)   Resp 14   Ht 1.829 m (6')   Wt 85.7 kg   SpO2 97%   BMI 25.62 kg/m  Physical Exam Vitals and nursing note reviewed.  Constitutional:      General: He is not in acute distress.    Appearance: He is well-developed.  HENT:     Head: Normocephalic and atraumatic.     Mouth/Throat:     Mouth: Mucous membranes are moist.  Eyes:     Extraocular Movements: Extraocular movements intact.     Conjunctiva/sclera: Conjunctivae normal.     Pupils: Pupils are equal, round, and reactive to light.  Cardiovascular:     Rate and Rhythm: Normal rate and regular rhythm.     Heart sounds: No murmur heard. Pulmonary:     Effort: Pulmonary effort is normal. No respiratory distress.     Breath sounds: Normal breath sounds.  Abdominal:      Palpations: Abdomen is soft.     Tenderness: There is no abdominal tenderness.  Musculoskeletal:        General: No swelling.     Cervical back: Neck supple. Tenderness present.     Comments: Lower extremities without any deformity.  No tenderness with range of motion.  Skin:    General: Skin is warm and dry.     Capillary Refill: Capillary refill takes less than 2 seconds.  Neurological:     General: No focal deficit present.     Mental Status: He is alert. Mental status is at baseline.  Psychiatric:        Mood and Affect: Mood normal.     ED Results / Procedures / Treatments   Labs (all labs ordered are listed, but only abnormal results are displayed) Labs Reviewed  CBC WITH DIFFERENTIAL/PLATELET - Abnormal; Notable for the following components:      Result Value   WBC 10.9 (*)    RBC 4.00 (*)    Hemoglobin 11.9 (*)    HCT 37.0 (*)    Neutro Abs 8.3 (*)    All other components within normal limits  COMPREHENSIVE METABOLIC PANEL - Abnormal; Notable for the following components:   Glucose, Bld 131 (*)    Calcium 8.7 (*)    Total Bilirubin 1.6 (*)    GFR, Estimated 58 (*)    All other components within normal limits  URINALYSIS, ROUTINE W REFLEX MICROSCOPIC  BRAIN NATRIURETIC PEPTIDE    EKG EKG Interpretation  Date/Time:  Sunday June 27 2022 11:00:01 EST Ventricular Rate:  56 PR Interval:  224 QRS Duration: 137 QT Interval:  493 QTC Calculation: 476 R Axis:   13 Text Interpretation: Sinus rhythm Prolonged PR interval Left bundle branch block Baseline wander in lead(s) V3 No significant change since last tracing Confirmed by Fredia Sorrow [phone]) on 06/27/2022 11:03:22 AM  Radiology CT Hip Left Wo Contrast  Result Date: 06/27/2022 CLINICAL DATA:  Hip fracture EXAM: CT OF THE LEFT HIP WITHOUT CONTRAST TECHNIQUE: Multidetector CT imaging of the left hip was performed according to the standard protocol. Multiplanar CT image reconstructions were also generated.  RADIATION DOSE REDUCTION: This exam was performed according to the departmental dose-optimization program which includes automated exposure control, adjustment of the mA and/or kV according to patient size and/or use of iterative reconstruction technique. COMPARISON:  X-ray 06/27/2022 earlier FINDINGS: Bones/Joint/Cartilage Osteopenia. There is a comminuted fracture involving the left greater trochanter with mildly displaced fragments. Fracture lines appear isolated to the greater trochanter. No clear extension further into the intertrochanteric space, the lesser trochanter or to the femoral neck or head. No additional fractures identified of the visualized pelvis and pubic bone. Mild sclerosis of the left sacroiliac joint. Ligaments Suboptimally assessed by CT. Muscles and Tendons Mild fatty muscle atrophy of the visualized pelvis and thigh region. Soft tissues Mild lateral soft tissue stranding. Small joint effusion. Note is made of enlargement of the prostate. There is trabeculation and thickening of the urinary bladder wall. Visualized bowel in the pelvis is nondilated with some scattered colonic stool. No specific abnormal lymph node enlargement identified in the visualized left hemipelvis or inguinal region. Vascular calcifications. IMPRESSION: Comminuted fracture involving the left greater trochanter. Fracture involves the base of the trochanter. No further extension of fracture lines. Underlying osteopenia. Electronically Signed   By: Jill Side M.D.   On: 06/27/2022 14:44   DG Chest Port 1 View  Result Date: 06/27/2022 CLINICAL DATA:  Hip fracture.  Preop radiograph. EXAM: PORTABLE CHEST 1 VIEW COMPARISON:  01/08/2022 FINDINGS: Stable cardiomediastinal contours. Previous median sternotomy and CABG procedure. Pulmonary vascular congestion. Small right pleural effusion noted. No airspace consolidation. IMPRESSION: 1. Small right pleural effusion. 2. Pulmonary vascular congestion. Electronically Signed    By: Kerby Moors M.D.   On: 06/27/2022 13:01   DG Hip Unilat With Pelvis 2-3 Views Left  Result Date: 06/27/2022 CLINICAL DATA:  Fall. EXAM: DG HIP (WITH OR WITHOUT PELVIS) 2-3V LEFT COMPARISON:  None Available. FINDINGS: Bones are diffusely demineralized. SI joints and symphysis pubis unremarkable. Acute fracture involving the  greater trochanter of the left proximal femur evident. No definite fracture line visible in the femoral neck or lesser trochanter. IMPRESSION: Acute fracture involving the greater trochanter of the left proximal femur. It is unclear whether this represents an isolated greater trochanter fracture or nondisplaced intertrochanteric femoral neck fracture. CT or MRI could be used to further evaluate. Electronically Signed   By: Misty Stanley M.D.   On: 06/27/2022 12:19   CT Head Wo Contrast  Result Date: 06/27/2022 CLINICAL DATA:  Head trauma, minor.  Head injury with headache EXAM: CT HEAD WITHOUT CONTRAST CT CERVICAL SPINE WITHOUT CONTRAST TECHNIQUE: Multidetector CT imaging of the head and cervical spine was performed following the standard protocol without intravenous contrast. Multiplanar CT image reconstructions of the cervical spine were also generated. RADIATION DOSE REDUCTION: This exam was performed according to the departmental dose-optimization program which includes automated exposure control, adjustment of the mA and/or kV according to patient size and/or use of iterative reconstruction technique. COMPARISON:  04/09/2022 head CT FINDINGS: CT HEAD FINDINGS Brain: No evidence of acute infarction, hemorrhage, hydrocephalus, extra-axial collection or mass lesion/mass effect. Generalized atrophy. Mild for age chronic small vessel ischemia in the cerebral white matter. Vascular: No hyperdense vessel or unexpected calcification. Skull: Normal. Negative for fracture or focal lesion. Sinuses/Orbits: No acute finding. CT CERVICAL SPINE FINDINGS Alignment: No traumatic malalignment  Skull base and vertebrae: No acute cervical spine fracture. No primary bone lesion or focal pathologic process. T2 and T3 bands of sclerosis and mild superior endplate deformity since prior. Soft tissues and spinal canal: No prevertebral fluid or swelling. No visible canal hematoma. Disc levels: Generalized degenerative disc collapse with endplate ridging. Diffuse degenerative facet spurring. Upper chest: No acute finding IMPRESSION: 1. No evidence of acute intracranial or cervical spine injury. 2. T2 and T3 mild superior endplate fractures since 03/15/2022, but chronic appearing. Electronically Signed   By: Jorje Guild M.D.   On: 06/27/2022 12:04   CT Cervical Spine Wo Contrast  Result Date: 06/27/2022 CLINICAL DATA:  Head trauma, minor.  Head injury with headache EXAM: CT HEAD WITHOUT CONTRAST CT CERVICAL SPINE WITHOUT CONTRAST TECHNIQUE: Multidetector CT imaging of the head and cervical spine was performed following the standard protocol without intravenous contrast. Multiplanar CT image reconstructions of the cervical spine were also generated. RADIATION DOSE REDUCTION: This exam was performed according to the departmental dose-optimization program which includes automated exposure control, adjustment of the mA and/or kV according to patient size and/or use of iterative reconstruction technique. COMPARISON:  04/09/2022 head CT FINDINGS: CT HEAD FINDINGS Brain: No evidence of acute infarction, hemorrhage, hydrocephalus, extra-axial collection or mass lesion/mass effect. Generalized atrophy. Mild for age chronic small vessel ischemia in the cerebral white matter. Vascular: No hyperdense vessel or unexpected calcification. Skull: Normal. Negative for fracture or focal lesion. Sinuses/Orbits: No acute finding. CT CERVICAL SPINE FINDINGS Alignment: No traumatic malalignment Skull base and vertebrae: No acute cervical spine fracture. No primary bone lesion or focal pathologic process. T2 and T3 bands of  sclerosis and mild superior endplate deformity since prior. Soft tissues and spinal canal: No prevertebral fluid or swelling. No visible canal hematoma. Disc levels: Generalized degenerative disc collapse with endplate ridging. Diffuse degenerative facet spurring. Upper chest: No acute finding IMPRESSION: 1. No evidence of acute intracranial or cervical spine injury. 2. T2 and T3 mild superior endplate fractures since 03/15/2022, but chronic appearing. Electronically Signed   By: Jorje Guild M.D.   On: 06/27/2022 12:04    Procedures Procedures  Medications Ordered in ED Medications  0.9 %  sodium chloride infusion ( Intravenous New Bag/Given 06/27/22 1332)    ED Course/ Medical Decision Making/ A&P                             Medical Decision Making Amount and/or Complexity of Data Reviewed Labs: ordered. Radiology: ordered.  Risk Prescription drug management. Decision regarding hospitalization.   Patient without any obvious trauma.  Complaining of head and neck pain.  May be some tenderness to the posterior neck.  Patient had good movement of both lower extremities but he originally said that his left thigh area was hurting.  Will go ahead and get left hip no obvious femur deformity to that leg.  Will also get head CT and CT neck.  CT head and neck without any acute findings.  There was evidence of T2-T3 mild superior endplate fractures but there are chronic appearing.  New since November 2023.  X-ray of the left hip and pelvis acute fracture involving the greater trochanter of the left proximal femur it is unclear whether this represents an isolated greater trochanter fracture or nondisplaced intertrochanteric femoral neck fracture.  Will discuss with orthopedics but they may want CT of the left hip to clarify.  Spoke with Dr. Doreatha Martin from orthopedics who is recommending CT scan to further clarify.  Because of its just a greater trochanter fracture that is nonsurgical.   CT  left hip ordered.  CT reviewed by Dr. Doreatha Martin.  It is not a through and through fracture.  Nonsurgical.  Patient can be treated with weightbearing as tolerated.  Follow-up with him in the office in about 2 weeks.  Since patient is from a nursing facility do not feel that he requires a medical admission here can be discharged back to the facility.  Urinalysis negative.  CBC white count 10.9 hemoglobin 11.9 platelets 185.  Complete metabolic panel blood sugar 131 GFR 58 total bili at 1.6 but has been elevated like that before no significant liver function test abnormalities no electrolyte abnormalities.  BN P pending.  Chest x-ray raise some concerns about pulmonary vascular congestion small right pleural effusion but no significant pulmonary edema.  X-ray of the left hip raise concerns about left anterior trochanteric femoral neck fracture.  However there was some question of whether just involve just the greater trochanter.   Dr. Doreatha Martin said patient could be admitted for physical therapy but since patient is from a nursing facility and he has dementia I do not think were going to gain too much with that.  So I think patient can be discharged back to nursing facility.  Strangely enough patient complaining more of his head hurting than his left hip.  Will give some IV hydromorphone.   Final Clinical Impression(s) / ED Diagnoses Final diagnoses:  Fall, initial encounter  Injury of head, initial encounter  Cervical strain, acute, initial encounter  Closed left hip fracture, initial encounter [facility])    Rx / DC Orders ED Discharge Orders     None         Fredia Sorrow, MD 06/27/22 1118    Fredia Sorrow, MD 06/27/22 1246    Fredia Sorrow, MD 06/27/22 1331    Fredia Sorrow, MD 06/27/22 1523    Fredia Sorrow, MD 06/27/22 [phone]

## 2022-06-27 NOTE — ED Notes (Signed)
Patient transported to CT 

## 2022-06-27 NOTE — Discharge Instructions (Signed)
Left hip fracture nonsurgical reviewed by Dr. Doreatha Martin orthopedics wants to see him in the office in 2 weeks.  Can weight-bear as tolerated.  Will require pain control.

## 2022-06-27 NOTE — ED Triage Notes (Signed)
EMS called out by [facility] facility for pt having multiple falls. Patient states he hit his head and having headache. Patient did not lose LOC and complaining of left thigh pain. Per EMS patient is not on blood thinners.

## 2022-06-28 ENCOUNTER — Emergency Department (HOSPITAL_COMMUNITY)
Admission: EM | Admit: 2022-06-28 | Discharge: 2022-06-29 | Disposition: A | Discharge status: Skilled Nursing Facility | Attending: Emergency Medicine | Admitting: Emergency Medicine

## 2022-06-28 ENCOUNTER — Emergency Department (HOSPITAL_COMMUNITY)

## 2022-06-28 ENCOUNTER — Encounter (HOSPITAL_COMMUNITY): Payer: Self-pay | Admitting: Emergency Medicine

## 2022-06-28 DIAGNOSIS — Z7982 Long term (current) use of aspirin: Secondary | ICD-10-CM | POA: Insufficient documentation

## 2022-06-28 DIAGNOSIS — S0101XA Laceration without foreign body of scalp, initial encounter: Secondary | ICD-10-CM | POA: Diagnosis present

## 2022-06-28 DIAGNOSIS — F0390 Unspecified dementia without behavioral disturbance: Secondary | ICD-10-CM | POA: Insufficient documentation

## 2022-06-28 DIAGNOSIS — Y92129 Unspecified place in nursing home as the place of occurrence of the external cause: Secondary | ICD-10-CM | POA: Diagnosis not present

## 2022-06-28 DIAGNOSIS — S72112D Displaced fracture of greater trochanter of left femur, subsequent encounter for closed fracture with routine healing: Secondary | ICD-10-CM

## 2022-06-28 DIAGNOSIS — W1830XA Fall on same level, unspecified, initial encounter: Secondary | ICD-10-CM | POA: Insufficient documentation

## 2022-06-28 DIAGNOSIS — S72112A Displaced fracture of greater trochanter of left femur, initial encounter for closed fracture: Secondary | ICD-10-CM | POA: Diagnosis not present

## 2022-06-28 DIAGNOSIS — W19XXXA Unspecified fall, initial encounter: Secondary | ICD-10-CM

## 2022-06-28 MED ORDER — LIDOCAINE-EPINEPHRINE (PF) 2 %-1:200000 IJ SOLN
20.0000 mL | Freq: Once | INTRAMUSCULAR | Status: AC
  Administered 2022-06-28: 20 mL
  Filled 2022-06-28: qty 20

## 2022-06-28 NOTE — ED Notes (Signed)
Notified [facility] of patient needing transportation back to [place],

## 2022-06-28 NOTE — ED Notes (Signed)
Yellow fall risk band place on pt

## 2022-06-28 NOTE — ED Triage Notes (Signed)
Pt arrived via [facility] from [facility] c/o fall in which he hit his head on a wheelchair and has a large laceration to L side of head. Denies blood thinners, does take low dose Asprin. Hx of dementia, falls, previous fracture from falls

## 2022-06-28 NOTE — ED Provider Notes (Signed)
[facility] Provider Note   CSN: [id number] Arrival date & time: 06/28/22  1749     History {Add pertinent medical, surgical, social history, OB history to HPI:1} Chief Complaint  Patient presents with   [name] is a 89 y.o. male.  5 caveat secondary to dementia.  Patient is here for evaluation of fall from his nursing facility.  Patient was seen yesterday for same hit his head and also complaining of some left hip pain.  At that time was found to have greater trochanter fracture.  He was returned back to his facility.  Plan was for outpatient follow-up with orthopedics.  The history is provided by the patient and the EMS personnel.  Fall This is a recurrent problem. The problem has not changed since onset.Associated symptoms include headaches. Pertinent negatives include no chest pain, no abdominal pain and no shortness of breath. Nothing aggravates the symptoms. Nothing relieves the symptoms. He has tried nothing for the symptoms. The treatment provided no relief.       Home Medications Prior to Admission medications   Medication Sig Start Date End Date Taking? Authorizing Provider  acetaminophen (TYLENOL) 500 MG tablet Take 2 tablets by mouth every 8 (eight) hours as needed for pain or fever. 11/20/20   [provider]  albuterol (PROVENTIL) (2.5 MG/3ML) 0.083% nebulizer solution Take 2.5 mg by nebulization every 4 (four) hours as needed for wheezing or shortness of breath.    [provider]  albuterol (VENTOLIN HFA) 108 (90 Base) MCG/ACT inhaler Inhale 2 puffs into the lungs every 4 (four) hours as needed for wheezing or shortness of breath.    [provider]  aspirin EC 81 MG tablet Take 1 tablet (81 mg total) by mouth daily. 05/22/12   Carlena Bjornstad, MD  atorvastatin (LIPITOR) 20 MG tablet Take 1 tablet (20 mg total) by mouth every morning. 09/15/21   Arnoldo Lenis, MD  azithromycin  (ZITHROMAX) 250 MG tablet Take 1 tablet (250 mg total) by mouth daily. Take first 2 tablets together, then 1 every day until finished. 01/08/22   Marcello Fennel, PA-C  busPIRone (BUSPAR) 5 MG tablet Take 1 tablet by mouth 2 (two) times daily. 02/17/21   [provider]  citalopram (CELEXA) 20 MG tablet Take 20 mg by mouth daily.    [provider]  fluticasone (FLONASE) 50 MCG/ACT nasal spray Place 2 sprays into both nostrils daily.    [provider]  furosemide (LASIX) 40 MG tablet Take 1.5 tablets (60 mg total) by mouth in the morning AND 1 tablet (40 mg total) every evening. 09/15/21   Branch, Alphonse Guild, MD  gabapentin (NEURONTIN) 300 MG capsule Take 300 mg by mouth 2 (two) times daily.    [provider]  HYDROcodone-acetaminophen (NORCO/VICODIN) 5-325 MG tablet Take 1 tablet by mouth 2 (two) times daily.    [provider]  hydrOXYzine (ATARAX/VISTARIL) 25 MG tablet Take 25 mg by mouth every 6 (six) hours as needed for itching.    [provider]  lidocaine (HM LIDOCAINE PATCH) 4 % Place 2 patches onto the skin daily. Patient taking differently: Place 1 patch onto the skin daily. 12/15/21   Fransico Meadow, MD  loratadine (CLARITIN) 10 MG tablet Take 10 mg by mouth daily.    [provider]  LORazepam (ATIVAN) 0.5 MG tablet Take 0.5 mg by mouth every 3 (three) hours as needed for  anxiety.    [provider]  meclizine (ANTIVERT) 12.5 MG tablet Take 12.5 mg by mouth daily as needed for dizziness.    [provider]  metoprolol succinate (TOPROL-XL) 25 MG 24 hr tablet Take 25 mg by mouth daily.    [provider]  nitroGLYCERIN (NITROSTAT) 0.4 MG SL tablet Place 0.4 mg under the tongue every 5 (five) minutes x 3 doses as needed for chest pain (if no relief after 2nd dose, proceed to the ED for an evaluation or call 911).    [provider]  polyethylene glycol (MIRALAX / GLYCOLAX) 17 g packet  Take 17 g by mouth daily as needed for mild constipation.    [provider]  potassium chloride SA (KLOR-CON) 20 MEQ tablet Take 40 mEq by mouth daily.    [provider]  pramipexole (MIRAPEX) 0.125 MG tablet Take 0.125 mg by mouth at bedtime.    [provider]  risperiDONE (RISPERDAL) 0.25 MG tablet Take 1 tablet (0.25 mg total) by mouth at bedtime. 09/28/19   Danford, Suann Larry, MD  sennosides-docusate sodium (SENOKOT-S) 8.6-50 MG tablet Take 2 tablets by mouth daily as needed for constipation.    [provider]  simethicone (MYLICON) 80 MG chewable tablet Chew 80 mg by mouth every 6 (six) hours as needed for flatulence.    [provider]  tamsulosin (FLOMAX) 0.4 MG CAPS capsule Take 0.4 mg by mouth daily after supper.    [provider]  traZODone (DESYREL) 100 MG tablet Take 100 mg by mouth at bedtime.    [provider]      Allergies    Bactrim [sulfamethoxazole-trimethoprim], Sulfonamide derivatives, and Tetracycline    Review of Systems   Review of Systems  Respiratory:  Negative for shortness of breath.   Cardiovascular:  Negative for chest pain.  Gastrointestinal:  Negative for abdominal pain.  Neurological:  Positive for headaches.    Physical Exam Updated Vital Signs BP (!) 143/56   Pulse 62   Temp 98.1 F (36.7 C) (Oral)   Resp 19   SpO2 92%  Physical Exam Vitals and nursing note reviewed.  Constitutional:      General: He is not in acute distress.    Appearance: Normal appearance. He is well-developed.  HENT:     Head: Normocephalic.     Comments: Patient has approximately a 10 cm curvilinear laceration over his left parietal area. Eyes:     Conjunctiva/sclera: Conjunctivae normal.  Cardiovascular:     Rate and Rhythm: Normal rate and regular rhythm.     Heart sounds: No murmur heard. Pulmonary:     Effort: Pulmonary effort is normal. No respiratory distress.     Breath sounds: Normal  breath sounds.  Abdominal:     Palpations: Abdomen is soft.     Tenderness: There is no abdominal tenderness. There is no guarding.  Musculoskeletal:        General: Tenderness present. No deformity.     Cervical back: Neck supple.     Comments: Patient has some diffuse tenderness around his left hip  Skin:    General: Skin is warm and dry.     Capillary Refill: Capillary refill takes less than 2 seconds.  Neurological:     General: No focal deficit present.     Mental Status: He is alert. He is disoriented.     Sensory: No sensory deficit.     Motor: No weakness.     ED Results /  Procedures / Treatments   Labs (all labs ordered are listed, but only abnormal results are displayed) Labs Reviewed - No data to display  EKG None  Radiology CT Hip Left Wo Contrast  Result Date: 06/27/2022 CLINICAL DATA:  Hip fracture EXAM: CT OF THE LEFT HIP WITHOUT CONTRAST TECHNIQUE: Multidetector CT imaging of the left hip was performed according to the standard protocol. Multiplanar CT image reconstructions were also generated. RADIATION DOSE REDUCTION: This exam was performed according to the departmental dose-optimization program which includes automated exposure control, adjustment of the mA and/or kV according to patient size and/or use of iterative reconstruction technique. COMPARISON:  X-ray 06/27/2022 earlier FINDINGS: Bones/Joint/Cartilage Osteopenia. There is a comminuted fracture involving the left greater trochanter with mildly displaced fragments. Fracture lines appear isolated to the greater trochanter. No clear extension further into the intertrochanteric space, the lesser trochanter or to the femoral neck or head. No additional fractures identified of the visualized pelvis and pubic bone. Mild sclerosis of the left sacroiliac joint. Ligaments Suboptimally assessed by CT. Muscles and Tendons Mild fatty muscle atrophy of the visualized pelvis and thigh region. Soft tissues Mild lateral soft  tissue stranding. Small joint effusion. Note is made of enlargement of the prostate. There is trabeculation and thickening of the urinary bladder wall. Visualized bowel in the pelvis is nondilated with some scattered colonic stool. No specific abnormal lymph node enlargement identified in the visualized left hemipelvis or inguinal region. Vascular calcifications. IMPRESSION: Comminuted fracture involving the left greater trochanter. Fracture involves the base of the trochanter. No further extension of fracture lines. Underlying osteopenia. Electronically Signed   By: Jill Side M.D.   On: 06/27/2022 14:44   DG Chest Port 1 View  Result Date: 06/27/2022 CLINICAL DATA:  Hip fracture.  Preop radiograph. EXAM: PORTABLE CHEST 1 VIEW COMPARISON:  01/08/2022 FINDINGS: Stable cardiomediastinal contours. Previous median sternotomy and CABG procedure. Pulmonary vascular congestion. Small right pleural effusion noted. No airspace consolidation. IMPRESSION: 1. Small right pleural effusion. 2. Pulmonary vascular congestion. Electronically Signed   By: Kerby Moors M.D.   On: 06/27/2022 13:01   DG Hip Unilat With Pelvis 2-3 Views Left  Result Date: 06/27/2022 CLINICAL DATA:  Fall. EXAM: DG HIP (WITH OR WITHOUT PELVIS) 2-3V LEFT COMPARISON:  None Available. FINDINGS: Bones are diffusely demineralized. SI joints and symphysis pubis unremarkable. Acute fracture involving the greater trochanter of the left proximal femur evident. No definite fracture line visible in the femoral neck or lesser trochanter. IMPRESSION: Acute fracture involving the greater trochanter of the left proximal femur. It is unclear whether this represents an isolated greater trochanter fracture or nondisplaced intertrochanteric femoral neck fracture. CT or MRI could be used to further evaluate. Electronically Signed   By: Misty Stanley M.D.   On: 06/27/2022 12:19   CT Head Wo Contrast  Result Date: 06/27/2022 CLINICAL DATA:  Head trauma, minor.   Head injury with headache EXAM: CT HEAD WITHOUT CONTRAST CT CERVICAL SPINE WITHOUT CONTRAST TECHNIQUE: Multidetector CT imaging of the head and cervical spine was performed following the standard protocol without intravenous contrast. Multiplanar CT image reconstructions of the cervical spine were also generated. RADIATION DOSE REDUCTION: This exam was performed according to the departmental dose-optimization program which includes automated exposure control, adjustment of the mA and/or kV according to patient size and/or use of iterative reconstruction technique. COMPARISON:  04/09/2022 head CT FINDINGS: CT HEAD FINDINGS Brain: No evidence of acute infarction, hemorrhage, hydrocephalus, extra-axial collection or mass lesion/mass effect. Generalized atrophy. Mild for  age chronic small vessel ischemia in the cerebral white matter. Vascular: No hyperdense vessel or unexpected calcification. Skull: Normal. Negative for fracture or focal lesion. Sinuses/Orbits: No acute finding. CT CERVICAL SPINE FINDINGS Alignment: No traumatic malalignment Skull base and vertebrae: No acute cervical spine fracture. No primary bone lesion or focal pathologic process. T2 and T3 bands of sclerosis and mild superior endplate deformity since prior. Soft tissues and spinal canal: No prevertebral fluid or swelling. No visible canal hematoma. Disc levels: Generalized degenerative disc collapse with endplate ridging. Diffuse degenerative facet spurring. Upper chest: No acute finding IMPRESSION: 1. No evidence of acute intracranial or cervical spine injury. 2. T2 and T3 mild superior endplate fractures since 03/15/2022, but chronic appearing. Electronically Signed   By: Jorje Guild M.D.   On: 06/27/2022 12:04   CT Cervical Spine Wo Contrast  Result Date: 06/27/2022 CLINICAL DATA:  Head trauma, minor.  Head injury with headache EXAM: CT HEAD WITHOUT CONTRAST CT CERVICAL SPINE WITHOUT CONTRAST TECHNIQUE: Multidetector CT imaging of the  head and cervical spine was performed following the standard protocol without intravenous contrast. Multiplanar CT image reconstructions of the cervical spine were also generated. RADIATION DOSE REDUCTION: This exam was performed according to the departmental dose-optimization program which includes automated exposure control, adjustment of the mA and/or kV according to patient size and/or use of iterative reconstruction technique. COMPARISON:  04/09/2022 head CT FINDINGS: CT HEAD FINDINGS Brain: No evidence of acute infarction, hemorrhage, hydrocephalus, extra-axial collection or mass lesion/mass effect. Generalized atrophy. Mild for age chronic small vessel ischemia in the cerebral white matter. Vascular: No hyperdense vessel or unexpected calcification. Skull: Normal. Negative for fracture or focal lesion. Sinuses/Orbits: No acute finding. CT CERVICAL SPINE FINDINGS Alignment: No traumatic malalignment Skull base and vertebrae: No acute cervical spine fracture. No primary bone lesion or focal pathologic process. T2 and T3 bands of sclerosis and mild superior endplate deformity since prior. Soft tissues and spinal canal: No prevertebral fluid or swelling. No visible canal hematoma. Disc levels: Generalized degenerative disc collapse with endplate ridging. Diffuse degenerative facet spurring. Upper chest: No acute finding IMPRESSION: 1. No evidence of acute intracranial or cervical spine injury. 2. T2 and T3 mild superior endplate fractures since 03/15/2022, but chronic appearing. Electronically Signed   By: Jorje Guild M.D.   On: 06/27/2022 12:04    Procedures .Marland KitchenLaceration Repair  Date/Time: 06/28/2022 6:21 PM  Performed by: Hayden Rasmussen, MD Authorized by: Hayden Rasmussen, MD   Consent:    Consent obtained:  Verbal   Consent given by:  Patient   Risks, benefits, and alternatives were discussed: yes     Risks discussed:  Infection, nerve damage, poor wound healing, pain, retained foreign body,  tendon damage and vascular damage   Alternatives discussed:  No treatment, delayed treatment and referral Universal protocol:    Procedure explained and questions answered to patient or proxy's satisfaction: yes     Patient identity confirmed:  Verbally with patient Anesthesia:    Anesthesia method:  Local infiltration   Local anesthetic:  Lidocaine 2% WITH epi Laceration details:    Location:  Scalp   Scalp location:  L parietal   Length (cm):  10 Pre-procedure details:    Preparation:  Patient was prepped and draped in usual sterile fashion Treatment:    Area cleansed with:  Saline   Amount of cleaning:  Standard   Irrigation solution:  Sterile saline Skin repair:    Repair method:  Staples   Number of  staples:  11 Approximation:    Approximation:  Close Repair type:    Repair type:  Simple Post-procedure details:    Dressing:  Open (no dressing)   Procedure completion:  Tolerated well, no immediate complications   {Document cardiac monitor, telemetry assessment procedure when appropriate:1}  Medications Ordered in ED Medications  lidocaine-EPINEPHrine (XYLOCAINE W/EPI) 2 %-1:200000 (PF) injection 20 mL (has no administration in time range)    ED Course/ Medical Decision Making/ A&P   {   Click here for ABCD2, HEART and other calculatorsREFRESH Note before signing :1}                          Medical Decision Making Amount and/or Complexity of Data Reviewed Radiology: ordered.  Risk Prescription drug management.   This patient complains of ***; this involves an extensive number of treatment Options and is a complaint that carries with it a high risk of complications and morbidity. The differential includes ***  I ordered, reviewed and interpreted labs, which included *** I ordered medication *** and reviewed PMP when indicated. I ordered imaging studies which included *** and I independently    visualized and interpreted imaging which showed *** Additional  history obtained from *** Previous records obtained and reviewed *** I consulted *** and discussed lab and imaging findings and discussed disposition.  Cardiac monitoring reviewed, *** Social determinants considered, *** Critical Interventions: ***  After the interventions stated above, I reevaluated the patient and found *** Admission and further testing considered, ***   {Document critical care time when appropriate:1} {Document review of labs and clinical decision tools ie heart score, Chads2Vasc2 etc:1}  {Document your independent review of radiology images, and any outside records:1} {Document your discussion with family members, caretakers, and with consultants:1} {Document social determinants of health affecting pt's care:1} {Document your decision making why or why not admission, treatments were needed:1} Final Clinical Impression(s) / ED Diagnoses Final diagnoses:  None    Rx / DC Orders ED Discharge Orders     None

## 2022-06-28 NOTE — Discharge Instructions (Signed)
You are seen in the emergency department for evaluation of injuries from a fall.  You had a large scalp laceration that was stapled.  Staples will need to be removed in 7 to 10 days.  You may use soap and water.  You had a CAT scan of your head and neck along with a repeat CAT scan of your left hip.  Your greater tuberosity of your left hip is still fractured although does not appear worse.  Please follow-up with orthopedics as scheduled.

## 2022-06-29 MED ORDER — ACETAMINOPHEN 325 MG PO TABS
650.0000 mg | ORAL_TABLET | Freq: Once | ORAL | Status: AC
  Administered 2022-06-29: 650 mg via ORAL
  Filled 2022-06-29: qty 2

## 2022-06-29 NOTE — ED Notes (Signed)
Volunteer to go down to dietary to get breakfast tray. [name]

## 2022-06-29 NOTE — ED Notes (Signed)
Hospice nurse in to assess pt.

## 2022-06-29 NOTE — ED Notes (Signed)
Patients gown found soiled upon updating vitals. New gown placed and urinal back in place. Vitals updated at this time.

## 2022-06-29 NOTE — ED Notes (Signed)
   06/28/22 2254  What Happened  Was fall witnessed? No  Was patient injured? No  Patient found on floor  Found by Staff-comment  Stated prior activity to/from bed, chair, or stretcher  Provider Notification  Provider Name/Title Dr. Melina Copa  Date Provider Notified 06/28/22  Time Provider Notified 2200  Method of Notification Face-to-face  Notification Reason Fall  Provider response At bedside  Date of Provider Response 06/28/22  Time of Provider Response 2200  Follow Up  Family notified Yes - comment  Time family notified 2230  Additional tests Yes-comment  Simple treatment Ice  Progress note created (see row info) Yes  Blank note created Yes  Adult Fall Risk Assessment  Risk Factor Category (scoring not indicated) History of more than one fall within 6 months before admission (document High fall risk)  Patient Fall Risk Level High fall risk  Adult Fall Risk Interventions  Required Bundle Interventions *See Row Information* High fall risk - low, moderate, and high requirements implemented  Additional Interventions Room near nurses station  Fall intervention(s) refused/Patient educated regarding refusal Nonskid socks;Yellow bracelet;Supervision while toileting/edge of bed sitting  Screening for Fall Injury Risk (To be completed on HIGH fall risk patients) - Assessing Need for Floor Mats  Risk For Fall Injury- Criteria for Floor Mats Admitted as a result of a fall  Will Implement Floor Mats Yes

## 2022-06-29 NOTE — ED Notes (Signed)
[facility] transported the pt to [facility]

## 2022-09-19 IMAGING — DX DG KNEE COMPLETE 4+V*R*
4 series · 4 of 4 positions shown · non-contrast
Comparison: 04/02/2011.

CLINICAL DATA: Fall, knee pain, initial encounter.

EXAM:
RIGHT KNEE - COMPLETE 4+ VIEW

[knee ap]
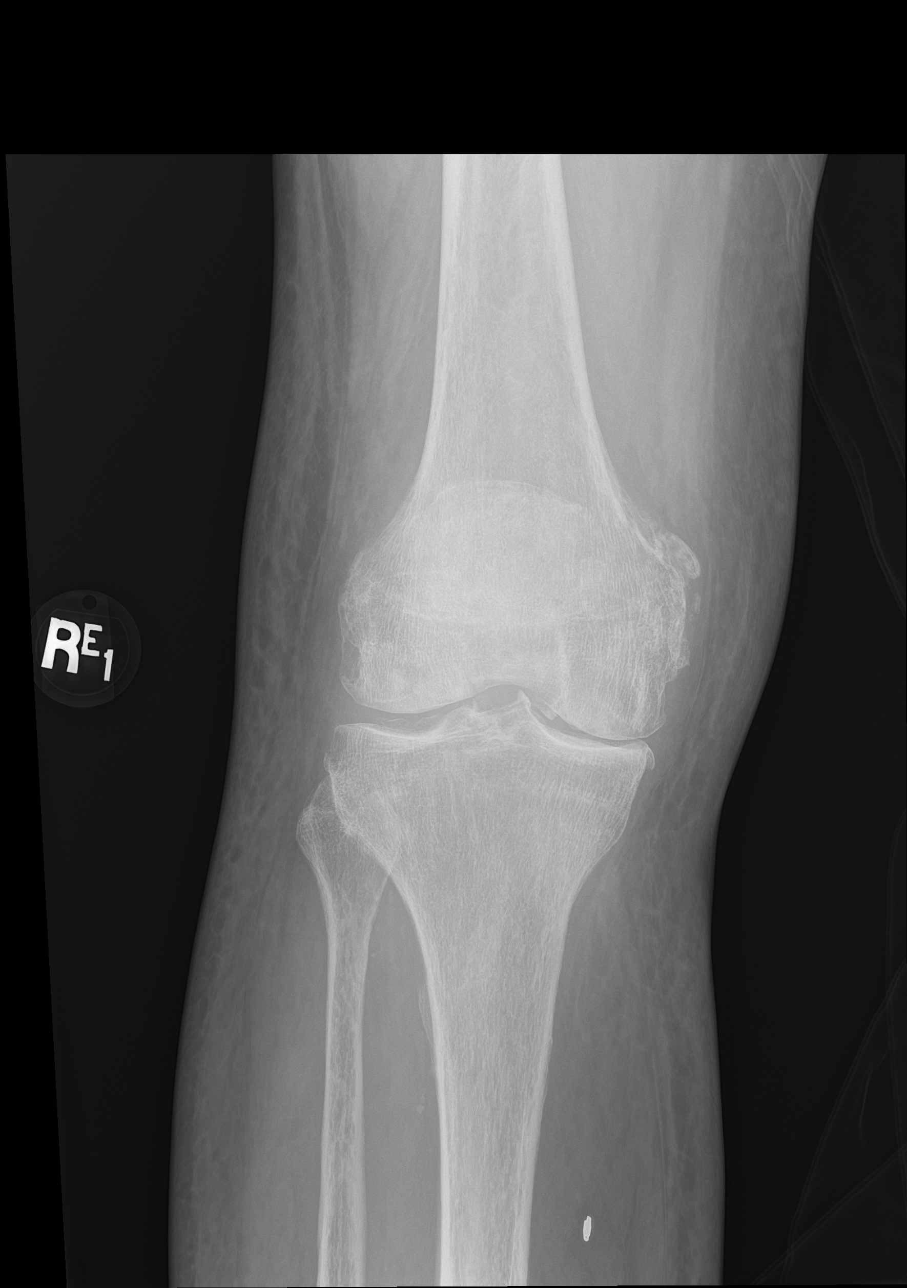

[knee obl (1 of 2)]
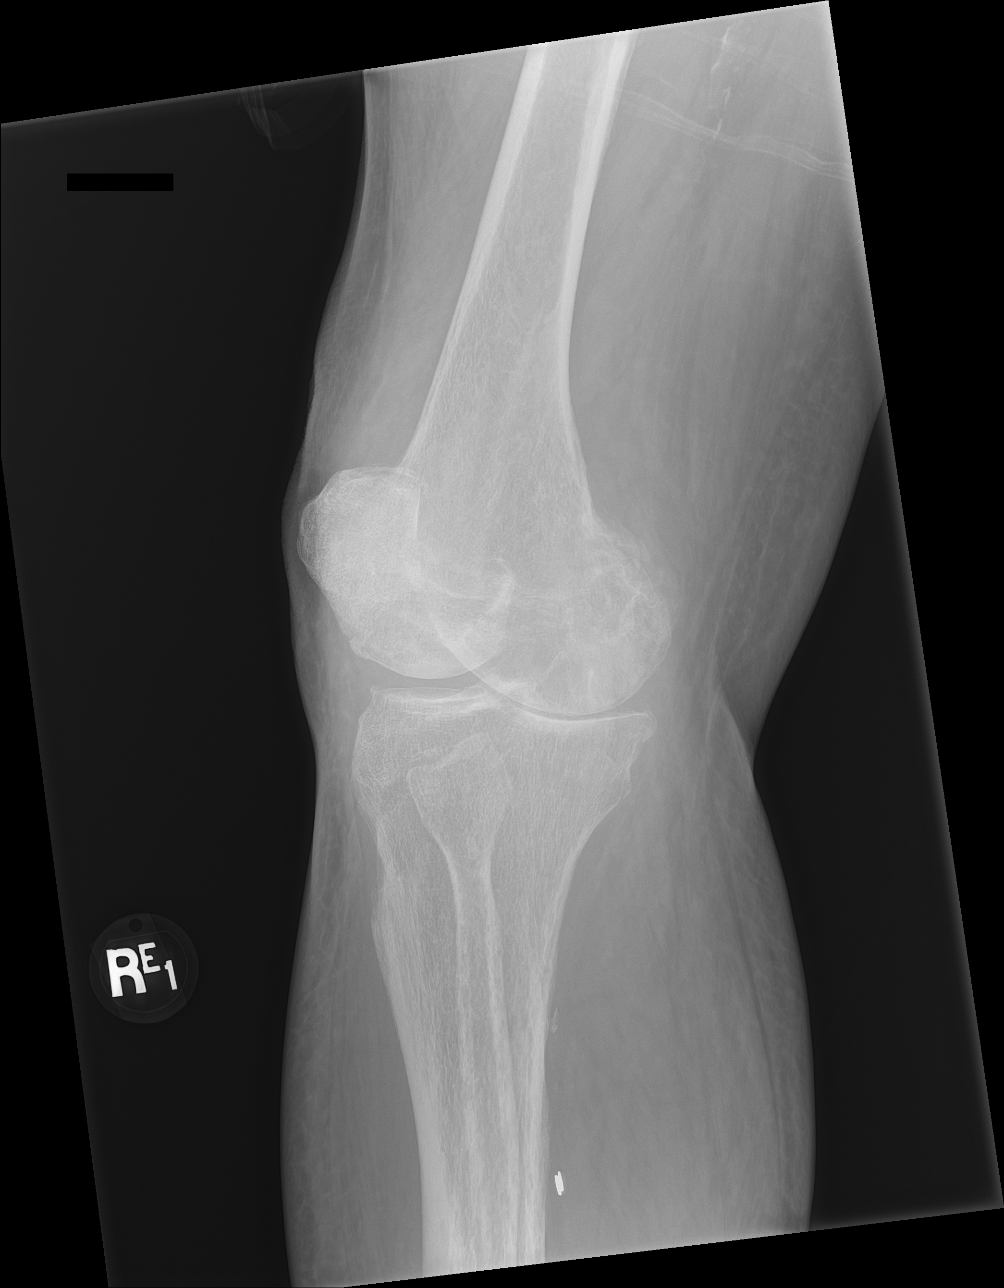

[knee obl (2 of 2)]
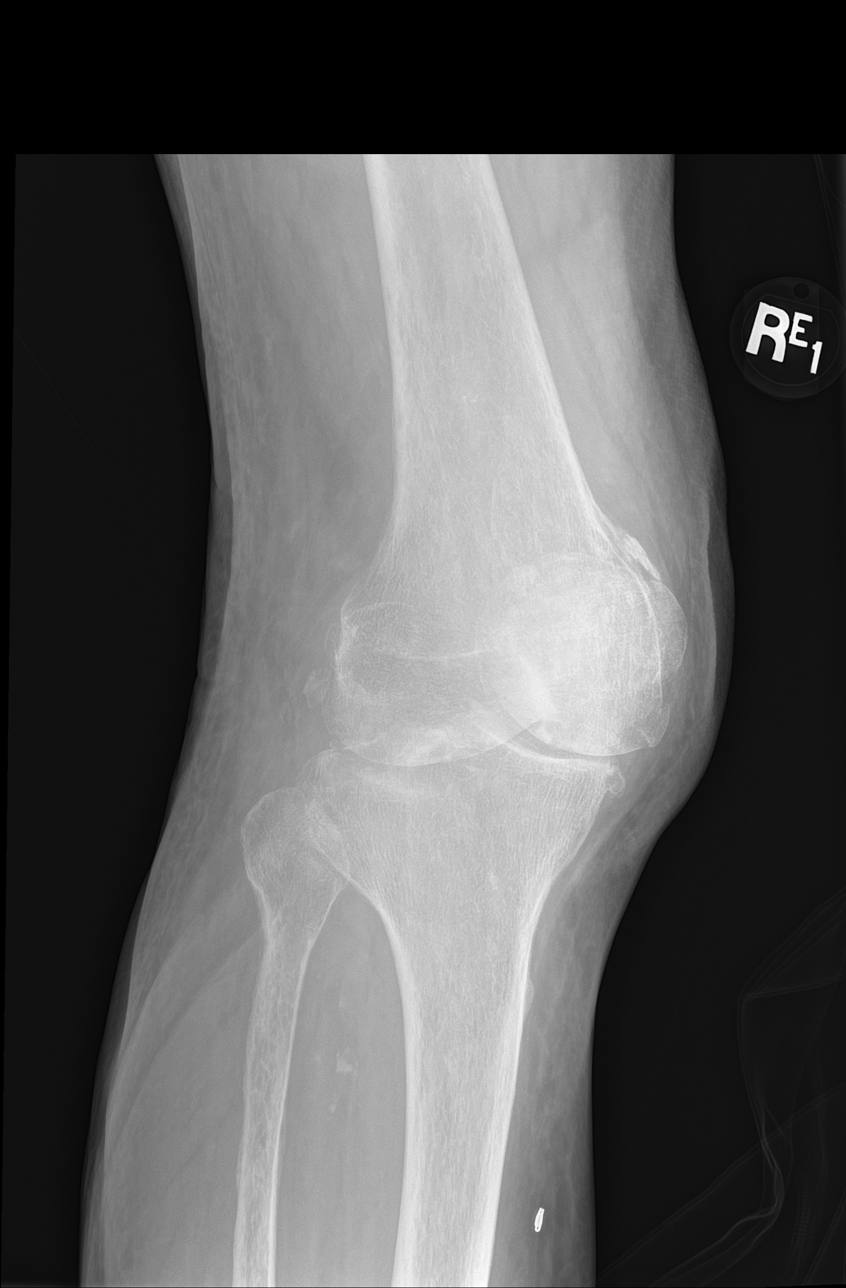

[knee lat]
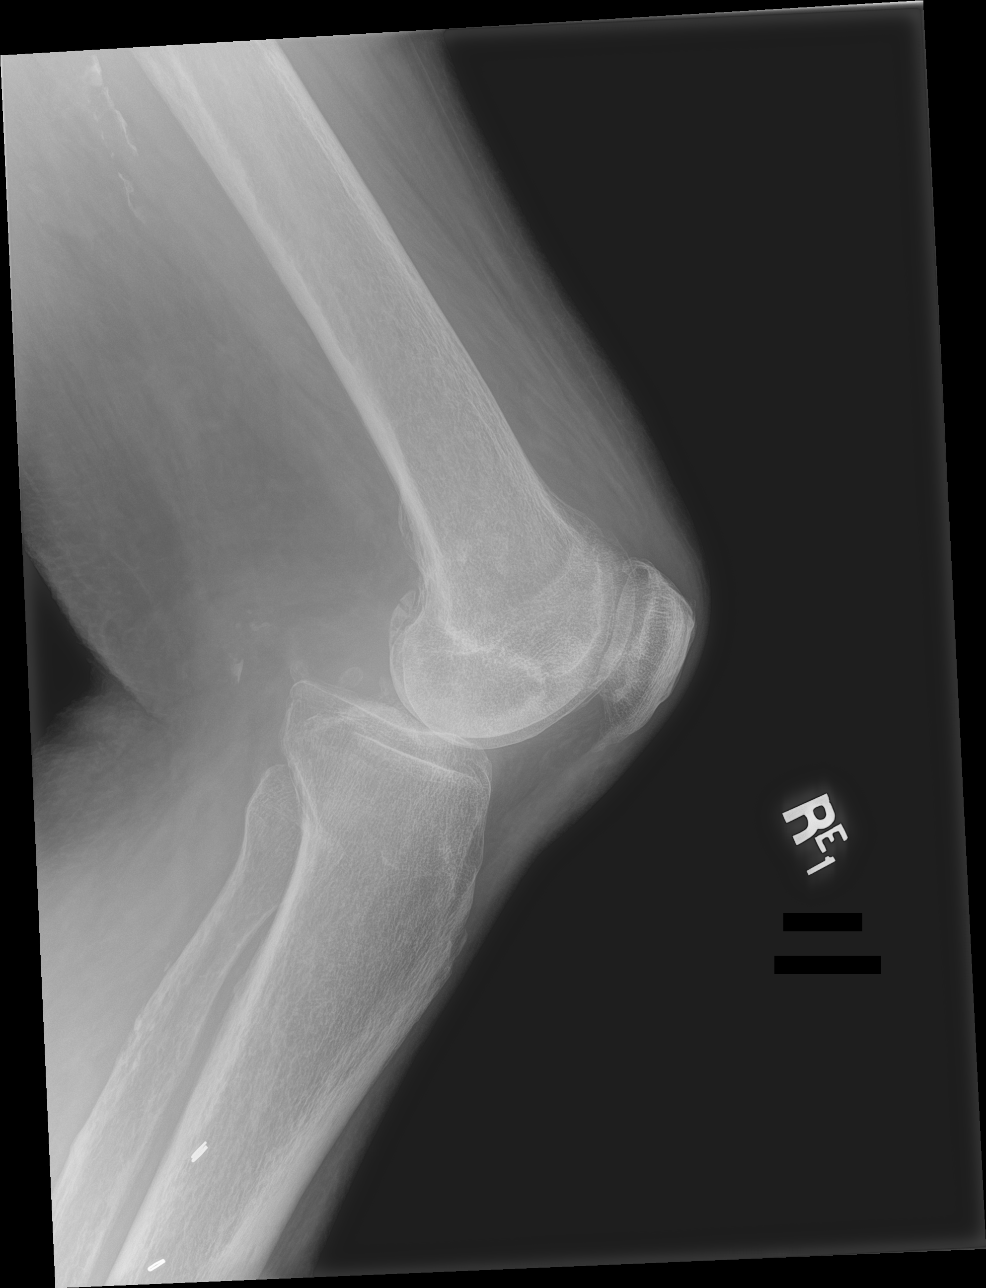

[4 of 4 positions shown; findings below may reference images not displayed]

FINDINGS: Medial joint space narrowing, subchondral sclerosis and
osteophytosis, new from 04/02/2011. Chondrocalcinosis in the lateral
compartment. Patellofemoral osteophytosis. No acute osseous or joint
abnormality.
IMPRESSION: 1. No acute osseous or joint abnormality.
2. Medial and patellofemoral compartment osteoarthritis.

## 2022-09-19 IMAGING — CT CT HEAD W/O CM
5 of 6 series · 15 of 47 positions shown, 16 images · non-contrast
Comparison: None.

CLINICAL DATA: Neck trauma (Age >= 65y); Head trauma, minor (Age >=
65y). Pt states his walker slipped from beneath him and fell
backwards hitting his head. EMS states he has a approx. 1 inch
laceration to the back of his head. Pt is on blood thinners



[Series 2: head w o · axial · 0.50mm/px · z∈[+57,+147]mm · 3 of 36 slices shown, 4 images]
[im 9/36  brain]
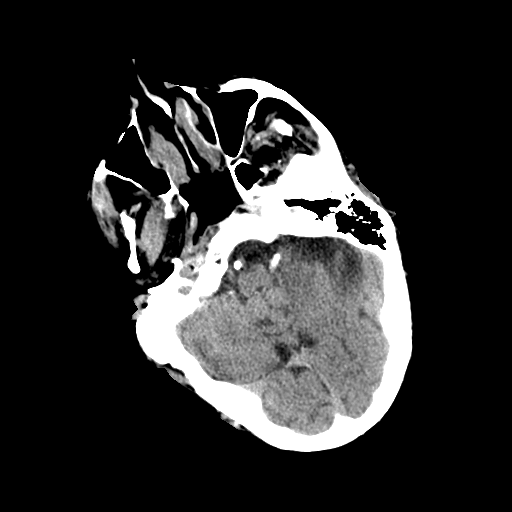
[im 9/36  bone]
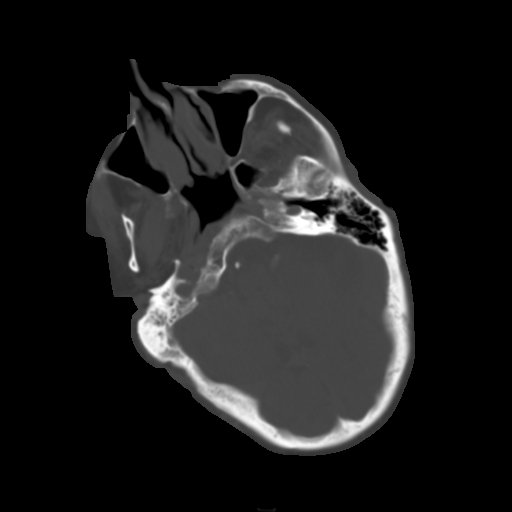
[im 18/36  brain]
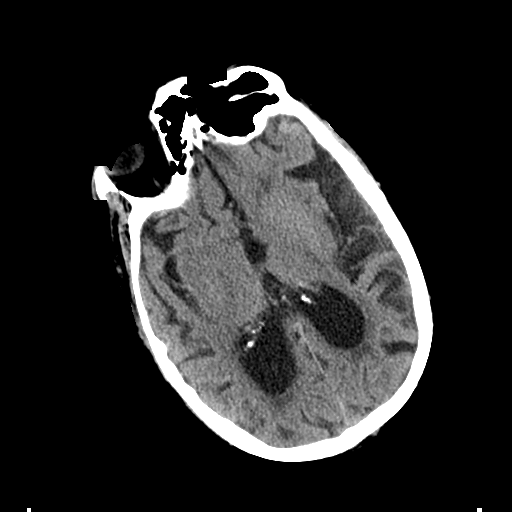
[im 27/36  brain]
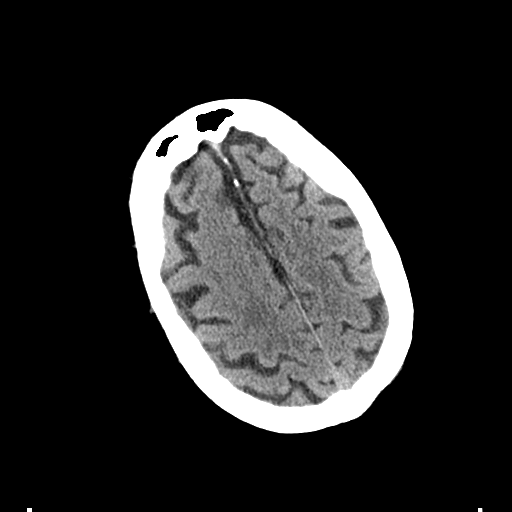

[Series 3: head bone · axial · 0.50mm/px · z∈[+33,+87]mm · 4 of 90 slices shown]
[im 9/90  bone]
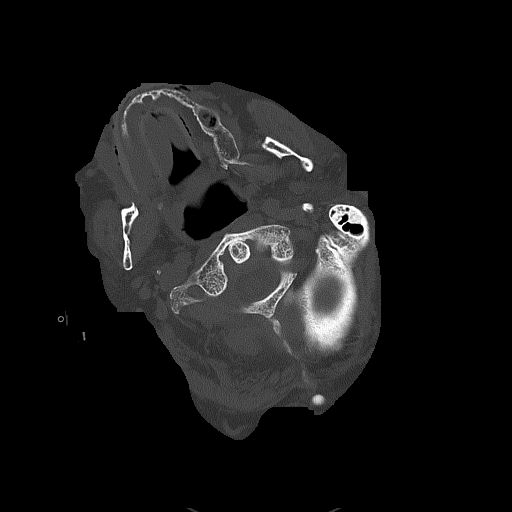
[im 18/90  bone]
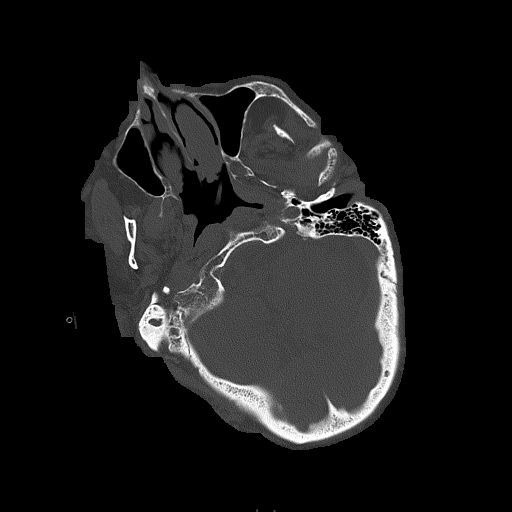
[im 27/90  bone]
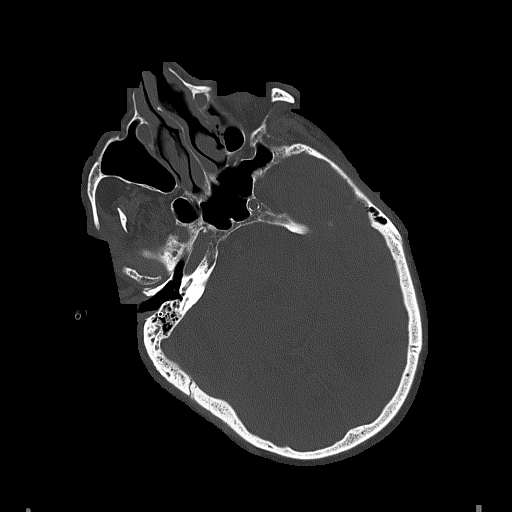
[im 36/90  bone]
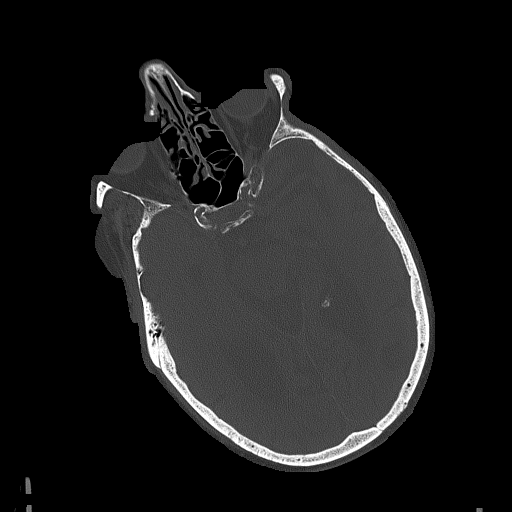

[Series 4: head ax w o · axial · 0.36mm/px · z∈[+117,+170]mm · 2 of 35 slices shown]
[im 12/35  brain]
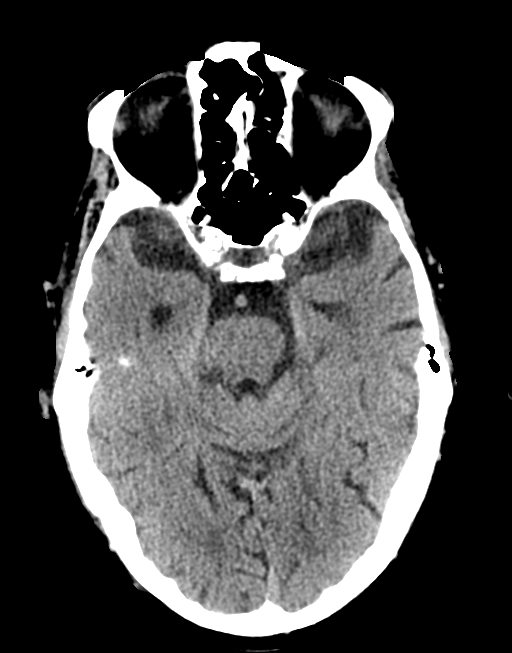
[im 23/35  brain]
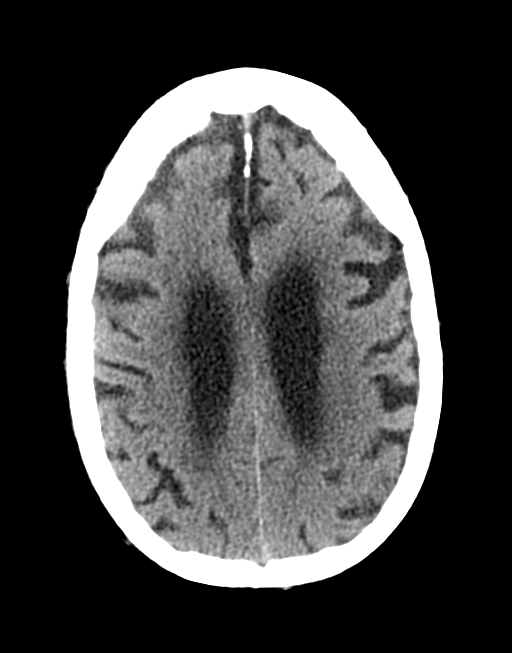

[Series 6: coronal soft · coronal · 0.32mm/px · 3 of 78 slices shown]
[im 26/78  brain]
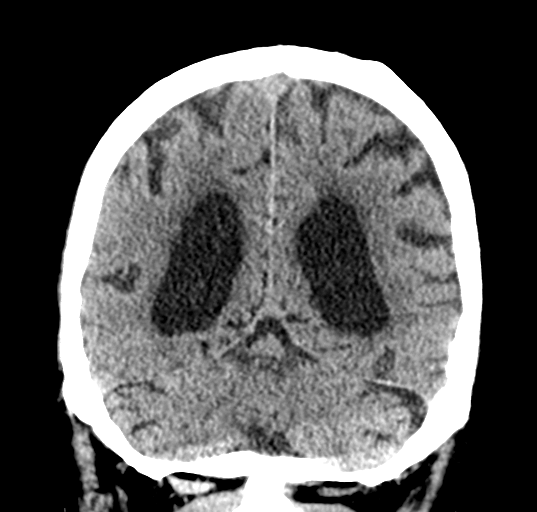
[im 35/78  brain]
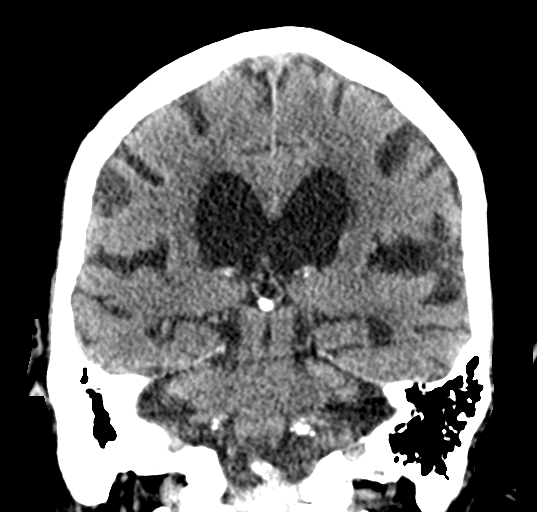
[im 43/78  brain]
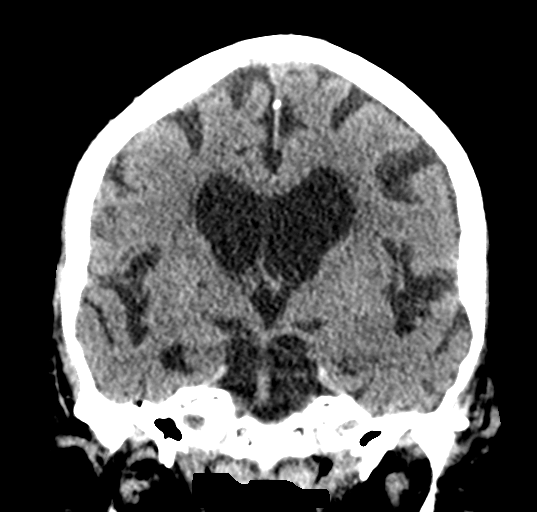

[Series 7: sagittal soft · sagittal · 0.32mm/px · 3 of 55 slices shown]
[im 19/55  brain]
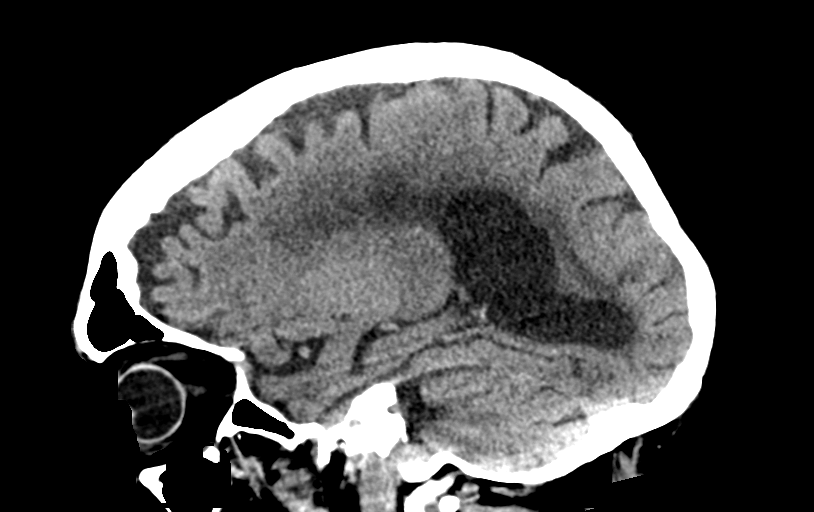
[im 28/55  brain]
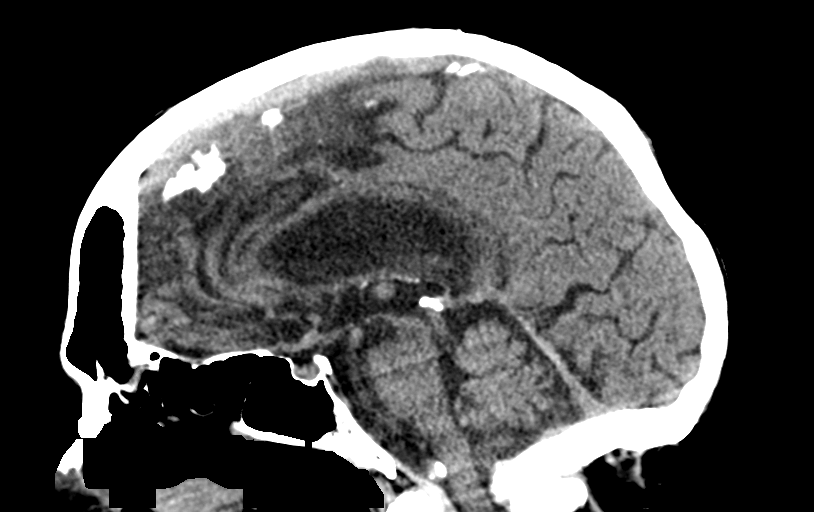
[im 36/55  brain]
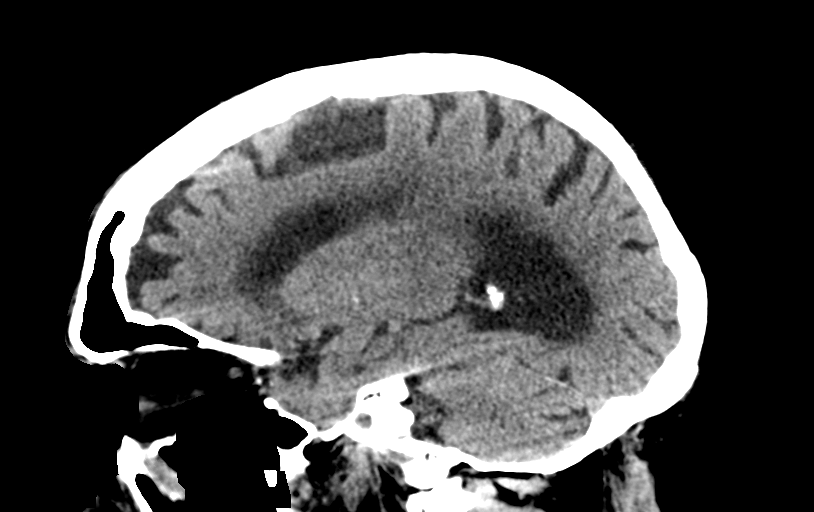

[15 of 47 positions shown; findings below may reference images not displayed]

FINDINGS: CT HEAD FINDINGS

BRAIN:
BRAIN
Cerebral ventricle sizes are concordant with the degree of cerebral
volume loss. Patchy and confluent areas of decreased attenuation are
noted throughout the deep and periventricular white matter of the
cerebral hemispheres bilaterally, compatible with chronic
microvascular ischemic disease.

No evidence of large-territorial acute infarction. No parenchymal
hemorrhage. No mass lesion. No extra-axial collection.

No mass effect or midline shift. No hydrocephalus. Basilar cisterns
are patent.

Vascular: No hyperdense vessel. Atherosclerotic calcifications are
present within the cavernous internal carotid and vertebral
arteries.

Skull: No acute fracture or focal lesion.

Sinuses/Orbits: Chronic partial right mastoid air cell effusion. No
fluid within the middle ears. Paranasal sinuses and left mastoid air
cells are clear. The orbits are unremarkable.

Other: None.

CT CERVICAL SPINE FINDINGS

Alignment: Reversal of the normal cervical lordosis centered at the
C4 level due to positioning and degenerative changes.

Skull base and vertebrae: Multilevel severe degenerative changes of
the spine most prominent at the C3 through C6 levels. No associated
severe osseous neural foraminal or central canal stenosis. No acute
fracture. No aggressive appearing focal osseous lesion or focal
pathologic process.

Soft tissues and spinal canal: No prevertebral fluid or swelling. No
visible canal hematoma.

Upper chest: Unremarkable.

Other: Atherosclerotic plaque.
IMPRESSION: 1. No acute intracranial abnormality.
2. No acute displaced fracture or traumatic listhesis of the
cervical spine.
3. Chronic partial right mastoid air cell effusion.
